# Patient Record
Sex: Female | Born: 1960 | Race: Black or African American | Hispanic: No | Marital: Married | Smoking: Never smoker
Health system: Southern US, Community
[De-identification: ages and names within clinical notes are randomized; demographics above are authoritative.]

## PROBLEM LIST (undated history)

## (undated) DIAGNOSIS — C801 Malignant (primary) neoplasm, unspecified: Secondary | ICD-10-CM

## (undated) DIAGNOSIS — D649 Anemia, unspecified: Secondary | ICD-10-CM

## (undated) DIAGNOSIS — R011 Cardiac murmur, unspecified: Secondary | ICD-10-CM

## (undated) DIAGNOSIS — E785 Hyperlipidemia, unspecified: Secondary | ICD-10-CM

## (undated) DIAGNOSIS — Z01419 Encounter for gynecological examination (general) (routine) without abnormal findings: Secondary | ICD-10-CM

## (undated) DIAGNOSIS — I10 Essential (primary) hypertension: Secondary | ICD-10-CM

## (undated) HISTORY — DX: Essential (primary) hypertension: I10

## (undated) HISTORY — PX: OTHER SURGICAL HISTORY: SHX169

## (undated) HISTORY — PX: HEMORROIDECTOMY: SUR656

## (undated) HISTORY — DX: Hyperlipidemia, unspecified: E78.5

## (undated) HISTORY — DX: Cardiac murmur, unspecified: R01.1

## (undated) HISTORY — DX: Encounter for gynecological examination (general) (routine) without abnormal findings: Z01.419

## (undated) HISTORY — PX: POLYPECTOMY: SHX149

## (undated) HISTORY — DX: Anemia, unspecified: D64.9

---

## 1998-11-15 ENCOUNTER — Ambulatory Visit (HOSPITAL_BASED_OUTPATIENT_CLINIC_OR_DEPARTMENT_OTHER): Admission: RE | Admit: 1998-11-15 | Discharge: 1998-11-15 | Payer: Self-pay | Admitting: Surgery

## 1999-07-25 ENCOUNTER — Other Ambulatory Visit: Admission: RE | Admit: 1999-07-25 | Discharge: 1999-07-25 | Payer: Self-pay | Admitting: Obstetrics & Gynecology

## 2004-09-18 ENCOUNTER — Ambulatory Visit: Payer: Self-pay | Admitting: Family Medicine

## 2005-07-11 ENCOUNTER — Ambulatory Visit: Payer: Self-pay | Admitting: Family Medicine

## 2005-11-20 ENCOUNTER — Ambulatory Visit: Payer: Self-pay | Admitting: Family Medicine

## 2006-02-08 ENCOUNTER — Ambulatory Visit: Payer: Self-pay | Admitting: Internal Medicine

## 2007-01-12 ENCOUNTER — Ambulatory Visit: Payer: Self-pay | Admitting: Family Medicine

## 2007-01-12 DIAGNOSIS — I10 Essential (primary) hypertension: Secondary | ICD-10-CM

## 2007-01-12 DIAGNOSIS — Z8679 Personal history of other diseases of the circulatory system: Secondary | ICD-10-CM | POA: Insufficient documentation

## 2007-01-12 LAB — CONVERTED CEMR LAB
ALT: 22 units/L (ref 0–35)
AST: 19 units/L (ref 0–37)
Albumin: 3.7 g/dL (ref 3.5–5.2)
Alkaline Phosphatase: 73 units/L (ref 39–117)
BUN: 7 mg/dL (ref 6–23)
Basophils Absolute: 0 10*3/uL (ref 0.0–0.1)
Basophils Relative: 0.5 % (ref 0.0–1.0)
Bilirubin, Direct: 0.1 mg/dL (ref 0.0–0.3)
CO2: 27 meq/L (ref 19–32)
Calcium: 9.1 mg/dL (ref 8.4–10.5)
Chloride: 113 meq/L — ABNORMAL HIGH (ref 96–112)
Cholesterol: 204 mg/dL (ref 0–200)
Creatinine, Ser: 0.8 mg/dL (ref 0.4–1.2)
Direct LDL: 163.6 mg/dL
Eosinophils Absolute: 0.1 10*3/uL (ref 0.0–0.6)
Eosinophils Relative: 1.4 % (ref 0.0–5.0)
GFR calc Af Amer: 99 mL/min
GFR calc non Af Amer: 82 mL/min
Glucose, Bld: 100 mg/dL — ABNORMAL HIGH (ref 70–99)
HCT: 36.3 % (ref 36.0–46.0)
HDL: 29.4 mg/dL — ABNORMAL LOW (ref >39.0)
Hemoglobin: 12.2 g/dL (ref 12.0–15.0)
Lymphocytes Relative: 52.1 % — ABNORMAL HIGH (ref 12.0–46.0)
MCHC: 33.6 g/dL (ref 30.0–36.0)
MCV: 83.1 fL (ref 78.0–100.0)
Monocytes Absolute: 0.3 10*3/uL (ref 0.2–0.7)
Monocytes Relative: 6.8 % (ref 3.0–11.0)
Neutro Abs: 1.8 10*3/uL (ref 1.4–7.7)
Neutrophils Relative %: 39.2 % — ABNORMAL LOW (ref 43.0–77.0)
Platelets: 261 10*3/uL (ref 150–400)
Potassium: 3.8 meq/L (ref 3.5–5.1)
RBC: 4.36 M/uL (ref 3.87–5.11)
RDW: 13.7 % (ref 11.5–14.6)
Sodium: 144 meq/L (ref 135–145)
TSH: 0.94 microintl units/mL (ref 0.35–5.50)
Total Bilirubin: 0.6 mg/dL (ref 0.3–1.2)
Total CHOL/HDL Ratio: 6.9
Total Protein: 7.1 g/dL (ref 6.0–8.3)
Triglycerides: 57 mg/dL (ref 0–149)
VLDL: 11 mg/dL (ref 0–40)
WBC: 4.7 10*3/uL (ref 4.5–10.5)

## 2009-03-27 ENCOUNTER — Ambulatory Visit: Payer: Self-pay | Admitting: Family Medicine

## 2009-03-27 DIAGNOSIS — E785 Hyperlipidemia, unspecified: Secondary | ICD-10-CM

## 2009-03-29 ENCOUNTER — Ambulatory Visit: Payer: Self-pay | Admitting: Family Medicine

## 2009-03-31 LAB — CONVERTED CEMR LAB
ALT: 18 units/L (ref 0–35)
AST: 21 units/L (ref 0–37)
BUN: 11 mg/dL (ref 6–23)
Bilirubin Urine: NEGATIVE
Bilirubin, Direct: 0 mg/dL (ref 0.0–0.3)
Cholesterol: 134 mg/dL (ref 0–200)
Creatinine, Ser: 0.8 mg/dL (ref 0.4–1.2)
Eosinophils Relative: 0.9 % (ref 0.0–5.0)
GFR calc non Af Amer: 97.59 mL/min (ref >60)
Ketones, urine, test strip: NEGATIVE
LDL Cholesterol: 95 mg/dL (ref 0–99)
Monocytes Relative: 6.3 % (ref 3.0–12.0)
Neutrophils Relative %: 42.5 % — ABNORMAL LOW (ref 43.0–77.0)
Platelets: 223 10*3/uL (ref 150.0–400.0)
Protein, U semiquant: NEGATIVE
Total Bilirubin: 0.6 mg/dL (ref 0.3–1.2)
Triglycerides: 45 mg/dL (ref 0.0–149.0)
Urobilinogen, UA: 0.2
VLDL: 9 mg/dL (ref 0.0–40.0)
WBC: 4.7 10*3/uL (ref 4.5–10.5)

## 2009-08-23 ENCOUNTER — Telehealth: Payer: Self-pay | Admitting: Family Medicine

## 2010-04-18 ENCOUNTER — Ambulatory Visit: Payer: Self-pay | Admitting: Family Medicine

## 2010-04-23 LAB — CONVERTED CEMR LAB
ALT: 20 units/L (ref 0–35)
AST: 24 units/L (ref 0–37)
Alkaline Phosphatase: 81 units/L (ref 39–117)
BUN: 11 mg/dL (ref 6–23)
Bilirubin, Direct: 0.1 mg/dL (ref 0.0–0.3)
Calcium: 9 mg/dL (ref 8.4–10.5)
Cholesterol: 139 mg/dL (ref 0–200)
Eosinophils Relative: 0.4 % (ref 0.0–5.0)
GFR calc non Af Amer: 117.23 mL/min (ref >60)
HCT: 37.9 % (ref 36.0–46.0)
LDL Cholesterol: 72 mg/dL (ref 0–99)
Lymphocytes Relative: 46.8 % — ABNORMAL HIGH (ref 12.0–46.0)
Lymphs Abs: 2.4 10*3/uL (ref 0.7–4.0)
Monocytes Relative: 5.7 % (ref 3.0–12.0)
Neutrophils Relative %: 46.6 % (ref 43.0–77.0)
Platelets: 242 10*3/uL (ref 150.0–400.0)
Potassium: 3.9 meq/L (ref 3.5–5.1)
Sodium: 140 meq/L (ref 135–145)
Total Bilirubin: 0.8 mg/dL (ref 0.3–1.2)
VLDL: 21 mg/dL (ref 0.0–40.0)
WBC: 5.1 10*3/uL (ref 4.5–10.5)

## 2010-08-14 NOTE — Progress Notes (Signed)
Summary: diltiazem increase  Phone Note Call from Patient Call back at Home Phone [phone]   Caller: vm Summary of Call: BP going up again.  Requesting increase in Diltiazem generic to 180 again to [facility] [place] [phone]. Initial call taken by: Rudy Jew, RN,  August 23, 2009 10:20 AM  Follow-up for Phone Call        okay, Switch to Ditiazem 180 mg once daily for one year Follow-up by: Nelwyn Salisbury MD,  August 23, 2009 1:24 PM  Additional Follow-up for Phone Call Additional follow up Details #1::        Phone Call Completed, Rx Called In Additional Follow-up by: Alfred Levins, CMA,  August 23, 2009 4:19 PM    New/Updated Medications: DILTIAZEM HCL CR 180 MG CP24 (DILTIAZEM HCL) 1 by mouth daily Prescriptions: DILTIAZEM HCL CR 180 MG CP24 (DILTIAZEM HCL) 1 by mouth daily  #30 x 11   Entered by:   Alfred Levins, CMA   Authorized by:   Nelwyn Salisbury MD   Signed by:   Alfred Levins, CMA on 08/23/2009   Method used:   Electronically to        [facility] [phone]* (retail)       [address] [place], [place]  [address]       Ph: [phone] or [phone]       Fax: [phone]   RxID:   [id number]

## 2010-08-14 NOTE — Assessment & Plan Note (Signed)
Summary: MED FOLLOW UP//ALP   Vital Signs:  Patient profile:   50 year old female Weight:      153 pounds O2 Sat:      96 % Temp:     98.4 degrees F Pulse rate:   72 / minute BP sitting:   130 / 84  (left arm) Cuff size:   large  Vitals Entered By: Pura Spice, RN (April 18, 2010 11:15 AM) CC: cough congestions needs refills    History of Present Illness: Here to recheck BP and lipids. She has been doing well, her BP has been stable at home. She has had a cold for several weeks with a dry cough. No ST or fever.   Allergies (verified): No Known Drug Allergies  Past History:  Past Medical History: Hypertension Heart mumur Hyperlipidemia [facility] exams  Past Surgical History: Reviewed history from 01/12/2007 and no changes required. Denies surgical history  Review of Systems  The patient denies anorexia, fever, weight loss, weight gain, vision loss, decreased hearing, hoarseness, chest pain, syncope, dyspnea on exertion, peripheral edema, headaches, hemoptysis, abdominal pain, melena, hematochezia, severe indigestion/heartburn, hematuria, incontinence, genital sores, muscle weakness, suspicious skin lesions, transient blindness, difficulty walking, depression, unusual weight change, abnormal bleeding, enlarged lymph nodes, angioedema, breast masses, and testicular masses.    Physical Exam  General:  Well-developed,well-nourished,in no acute distress; alert,appropriate and cooperative throughout examination Head:  Normocephalic and atraumatic without obvious abnormalities. No apparent alopecia or balding. Eyes:  No corneal or conjunctival inflammation noted. EOMI. Perrla. Funduscopic exam benign, without hemorrhages, exudates or papilledema. Vision grossly normal. Ears:  External ear exam shows no significant lesions or deformities.  Otoscopic examination reveals clear canals, tympanic membranes are intact bilaterally without bulging, retraction,  inflammation or discharge. Hearing is grossly normal bilaterally. Nose:  External nasal examination shows no deformity or inflammation. Nasal mucosa are pink and moist without lesions or exudates. Mouth:  Oral mucosa and oropharynx without lesions or exudates.  Teeth in good repair. Neck:  No deformities, masses, or tenderness noted. Lungs:  Normal respiratory effort, chest expands symmetrically. Lungs are clear to auscultation, no crackles or wheezes. Heart:  Normal rate and regular rhythm. S1 and S2 normal without gallop, murmur, click, rub or other extra sounds.   Impression & Recommendations:  Problem # 1:  VIRAL URI (ICD-465.9)  Her updated medication list for this problem includes:    Hydromet 5-1.5 Mg/37ml Syrp (Hydrocodone-homatropine) .Marland Kitchen... 1 tsp q 4 hours as needed cough  Problem # 2:  HYPERLIPIDEMIA (ICD-272.4)  Her updated medication list for this problem includes:    Simvastatin 40 Mg Tabs (Simvastatin) .Marland Kitchen... Take one tablet at bedtime  Problem # 3:  HYPERTENSION (ICD-401.9)  Her updated medication list for this problem includes:    Hydrochlorothiazide 25 Mg Tabs (Hydrochlorothiazide) .Marland Kitchen... Take 1 tab by mouth every morning    Tenormin 50 Mg Tabs (Atenolol) .Marland Kitchen... 1 by mouth once daily    Diltiazem Hcl Cr 180 Mg Cp24 (Diltiazem hcl) .Marland Kitchen... 1 by mouth daily  Orders: UA Dipstick w/o Micro (automated)  (81003) Venipuncture (13086) TLB-Lipid Panel (80061-LIPID) TLB-BMP (Basic Metabolic Panel-BMET) (80048-METABOL) TLB-CBC Platelet - w/Differential (85025-CBCD) TLB-Hepatic/Liver Function Pnl (80076-HEPATIC) TLB-TSH (Thyroid Stimulating Hormone) (84443-TSH)  Complete Medication List: 1)  Simvastatin 40 Mg Tabs (Simvastatin) .... Take one tablet at bedtime 2)  Hydrochlorothiazide 25 Mg Tabs (Hydrochlorothiazide) .... Take 1 tab by mouth every morning 3)  Tenormin 50 Mg Tabs (Atenolol) .Marland Kitchen.. 1 by mouth  once daily 4)  Diltiazem Hcl Cr 180 Mg Cp24 (Diltiazem hcl) .Marland Kitchen.. 1 by mouth  daily 5)  Hydromet 5-1.5 Mg/76ml Syrp (Hydrocodone-homatropine) .Marland Kitchen.. 1 tsp q 4 hours as needed cough  Patient Instructions: 1)  get labs today Prescriptions: HYDROMET 5-1.5 MG/5ML SYRP (HYDROCODONE-HOMATROPINE) 1 tsp q 4 hours as needed cough  #240 x 0   Entered and Authorized by:   Nelwyn Salisbury MD   Signed by:   Nelwyn Salisbury MD on 04/18/2010   Method used:   Print then Give to Patient   RxID:   [id number] DILTIAZEM HCL CR 180 MG CP24 (DILTIAZEM HCL) 1 by mouth daily  #30 x 11   Entered and Authorized by:   Nelwyn Salisbury MD   Signed by:   Nelwyn Salisbury MD on 04/18/2010   Method used:   Electronically to        [facility] [phone]* (retail)       [address] [place], [place]  [address]       Ph: [phone] or [phone]       Fax: [phone]   RxID:   [id number] TENORMIN 50 MG TABS (ATENOLOL) 1 by mouth once daily  #30 x 11   Entered and Authorized by:   Nelwyn Salisbury MD   Signed by:   Nelwyn Salisbury MD on 04/18/2010   Method used:   Electronically to        [facility] [phone]* (retail)       [address] [place], [place]  [address]       Ph: [phone] or [phone]       Fax: [phone]   RxID:   [id number] HYDROCHLOROTHIAZIDE 25 MG  TABS (HYDROCHLOROTHIAZIDE) Take 1 tab by mouth every morning  #30 x 11   Entered and Authorized by:   Nelwyn Salisbury MD   Signed by:   Nelwyn Salisbury MD on 04/18/2010   Method used:   Electronically to        [facility] [phone]* (retail)       [address] [place], [place]  [address]       Ph: [phone] or [phone]       Fax: [phone]   RxID:   [id number] SIMVASTATIN 40 MG TABS (SIMVASTATIN) Take one tablet at bedtime  #30 x 11   Entered and Authorized by:   Nelwyn Salisbury MD   Signed by:   Nelwyn Salisbury MD on 04/18/2010   Method used:   Electronically to        [facility] [phone]* (retail)       [address] [place], [place]  [address]       Ph: [phone] or [phone]       Fax: [phone]   RxID:   [id number]   Appended Document: Orders Update     Clinical Lists Changes  Orders: Added new Service order of Specimen Handling (35701) - Signed      Appended Document: MED FOLLOW UP//ALP  Laboratory Results   Urine Tests  Routine Urinalysis   Color: yellow Appearance: Clear Glucose: negative   (Normal Range: Negative) Bilirubin: 1+   (Normal Range: Negative) Ketone: negative   (Normal Range: Negative) Spec. Gravity: 1.025   (Normal Range: 1.003-1.035) Blood: negative   (Normal Range: Negative) pH: 6.0   (Normal Range: 5.0-8.0) Protein: 1+   (Normal Range: Negative) Urobilinogen: 0.2   (Normal Range: 0-1) Nitrite: negative   (Normal Range: Negative) Leukocyte Esterace: trace   (Normal Range: Negative)    Comments: Rita Ohara  April 18, 2010 3:17 PM

## 2010-09-06 ENCOUNTER — Other Ambulatory Visit: Payer: Self-pay | Admitting: Family Medicine

## 2011-04-22 ENCOUNTER — Other Ambulatory Visit: Payer: Self-pay | Admitting: Family Medicine

## 2011-04-23 ENCOUNTER — Other Ambulatory Visit: Payer: Self-pay

## 2011-04-23 MED ORDER — ATENOLOL 50 MG PO TABS: 50.0000 mg | ORAL_TABLET | Freq: Every day | ORAL | Status: DC

## 2011-05-15 ENCOUNTER — Encounter: Payer: Self-pay | Admitting: Family Medicine

## 2011-05-15 ENCOUNTER — Ambulatory Visit (INDEPENDENT_AMBULATORY_CARE_PROVIDER_SITE_OTHER): Payer: No Typology Code available for payment source | Admitting: Family Medicine

## 2011-05-15 VITALS — BP 112/70 | HR 86 | Temp 98.4°F | Wt 188.0 lb

## 2011-05-15 DIAGNOSIS — I10 Essential (primary) hypertension: Secondary | ICD-10-CM

## 2011-05-15 DIAGNOSIS — E785 Hyperlipidemia, unspecified: Secondary | ICD-10-CM

## 2011-05-15 DIAGNOSIS — M129 Arthropathy, unspecified: Secondary | ICD-10-CM

## 2011-05-15 DIAGNOSIS — M1990 Unspecified osteoarthritis, unspecified site: Secondary | ICD-10-CM

## 2011-05-15 LAB — LIPID PANEL
Cholesterol: 108 mg/dL (ref 0–200)
HDL: 42.3 mg/dL (ref >39.00)
LDL Cholesterol: 57 mg/dL (ref 0–99)
Total CHOL/HDL Ratio: 3
Triglycerides: 44 mg/dL (ref 0.0–149.0)
VLDL: 8.8 mg/dL (ref 0.0–40.0)

## 2011-05-15 LAB — TSH: TSH: 0.55 u[IU]/mL (ref 0.35–5.50)

## 2011-05-15 LAB — CBC WITH DIFFERENTIAL/PLATELET
Basophils Absolute: 0 10*3/uL (ref 0.0–0.1)
Eosinophils Relative: 0.8 % (ref 0.0–5.0)
HCT: 36.1 % (ref 36.0–46.0)
Hemoglobin: 12.1 g/dL (ref 12.0–15.0)
Lymphocytes Relative: 58.6 % — ABNORMAL HIGH (ref 12.0–46.0)
Lymphs Abs: 2.5 10*3/uL (ref 0.7–4.0)
Monocytes Relative: 5.2 % (ref 3.0–12.0)
Neutro Abs: 1.5 10*3/uL (ref 1.4–7.7)
RDW: 14.8 % — ABNORMAL HIGH (ref 11.5–14.6)
WBC: 4.3 10*3/uL — ABNORMAL LOW (ref 4.5–10.5)

## 2011-05-15 LAB — POCT URINALYSIS DIPSTICK
Bilirubin, UA: NEGATIVE
Glucose, UA: NEGATIVE
Ketones, UA: NEGATIVE
Protein, UA: NEGATIVE
Spec Grav, UA: 1.02

## 2011-05-15 LAB — HEPATIC FUNCTION PANEL
Bilirubin, Direct: 0 mg/dL (ref 0.0–0.3)
Total Bilirubin: 0.2 mg/dL — ABNORMAL LOW (ref 0.3–1.2)

## 2011-05-15 LAB — BASIC METABOLIC PANEL
BUN: 11 mg/dL (ref 6–23)
Calcium: 8.8 mg/dL (ref 8.4–10.5)
Chloride: 106 mEq/L (ref 96–112)
Creatinine, Ser: 0.8 mg/dL (ref 0.4–1.2)
GFR: 104.26 mL/min (ref >60.00)

## 2011-05-15 MED ORDER — ATENOLOL 50 MG PO TABS: 50.0000 mg | ORAL_TABLET | Freq: Every day | ORAL | Status: DC

## 2011-05-15 MED ORDER — DILTIAZEM HCL ER COATED BEADS 120 MG PO CP24: 120.0000 mg | ORAL_CAPSULE | Freq: Every day | ORAL | Status: DC

## 2011-05-15 MED ORDER — SIMVASTATIN 40 MG PO TABS: 40.0000 mg | ORAL_TABLET | Freq: Every day | ORAL | Status: DC

## 2011-05-15 MED ORDER — HYDROCHLOROTHIAZIDE 25 MG PO TABS: 25.0000 mg | ORAL_TABLET | Freq: Every day | ORAL | Status: DC

## 2011-05-15 NOTE — Progress Notes (Signed)
  Subjective:    Patient ID: Rachel Frank, female    DOB: 04/04/1961, 50 y.o.   MRN: [id number]  HPI Here for refills. She feels fine except for some pain in the hips and knees. Tried some Tylenol with little benefit.    Review of Systems  Constitutional: Negative.   Respiratory: Negative.   Cardiovascular: Negative.   Musculoskeletal: Positive for arthralgias.       Objective:   Physical Exam  Constitutional: She appears well-developed and well-nourished.  Neck: No thyromegaly present.  Cardiovascular: Normal rate, regular rhythm, normal heart sounds and intact distal pulses.   Pulmonary/Chest: Effort normal and breath sounds normal.  Lymphadenopathy:    She has no cervical adenopathy.          Assessment & Plan:  Get fasting labs. Refill meds. Try Aleve for the arthritis

## 2011-05-17 NOTE — Progress Notes (Signed)
Quick Note:  Left a message for pt to return call. ______ 

## 2011-05-22 NOTE — Progress Notes (Signed)
Quick Note:  Left voice message with normal results. ______ 

## 2011-06-24 ENCOUNTER — Telehealth: Payer: Self-pay

## 2011-06-24 NOTE — Telephone Encounter (Signed)
Pt would like to know if her blood pressure medication was going to be changed to amlodopine and if she can have a fluid pill as well.  Pls send to [facility] on [address] in [place].

## 2011-06-25 NOTE — Telephone Encounter (Signed)
I don't understand this question. Why would we change her BP med?

## 2011-06-27 NOTE — Telephone Encounter (Signed)
I tried to call home # no answer. I will try again later.

## 2012-06-13 ENCOUNTER — Other Ambulatory Visit: Payer: Self-pay | Admitting: Family Medicine

## 2012-09-08 ENCOUNTER — Other Ambulatory Visit: Payer: Self-pay | Admitting: Family Medicine

## 2012-09-08 NOTE — Telephone Encounter (Signed)
Call in one month of each and she needs an OV soon

## 2012-09-08 NOTE — Telephone Encounter (Signed)
Can we refill these? Pt has not been seen in awhile.

## 2012-09-10 ENCOUNTER — Encounter: Payer: Self-pay | Admitting: Family Medicine

## 2012-09-10 ENCOUNTER — Ambulatory Visit (INDEPENDENT_AMBULATORY_CARE_PROVIDER_SITE_OTHER): Payer: No Typology Code available for payment source | Admitting: Family Medicine

## 2012-09-10 VITALS — BP 128/76 | HR 85 | Temp 98.3°F | Wt 194.0 lb

## 2012-09-10 DIAGNOSIS — I10 Essential (primary) hypertension: Secondary | ICD-10-CM

## 2012-09-10 DIAGNOSIS — E785 Hyperlipidemia, unspecified: Secondary | ICD-10-CM

## 2012-09-10 LAB — CBC WITH DIFFERENTIAL/PLATELET
Basophils Absolute: 0 10*3/uL (ref 0.0–0.1)
HCT: 38.5 % (ref 36.0–46.0)
Hemoglobin: 12.7 g/dL (ref 12.0–15.0)
Lymphs Abs: 2.6 10*3/uL (ref 0.7–4.0)
MCHC: 33.1 g/dL (ref 30.0–36.0)
Monocytes Relative: 6.9 % (ref 3.0–12.0)
Neutro Abs: 2 10*3/uL (ref 1.4–7.7)
RDW: 14.5 % (ref 11.5–14.6)

## 2012-09-10 LAB — HEPATIC FUNCTION PANEL
ALT: 25 U/L (ref 0–35)
AST: 24 U/L (ref 0–37)
Alkaline Phosphatase: 93 U/L (ref 39–117)
Bilirubin, Direct: 0.1 mg/dL (ref 0.0–0.3)
Total Bilirubin: 0.7 mg/dL (ref 0.3–1.2)

## 2012-09-10 LAB — POCT URINALYSIS DIPSTICK
Ketones, UA: NEGATIVE
Spec Grav, UA: 1.02
pH, UA: 6.5

## 2012-09-10 LAB — TSH: TSH: 1.1 u[IU]/mL (ref 0.35–5.50)

## 2012-09-10 LAB — BASIC METABOLIC PANEL
Chloride: 102 mEq/L (ref 96–112)
Potassium: 3.9 mEq/L (ref 3.5–5.1)

## 2012-09-10 LAB — LIPID PANEL
LDL Cholesterol: 75 mg/dL (ref 0–99)
Total CHOL/HDL Ratio: 4

## 2012-09-10 MED ORDER — ATENOLOL 50 MG PO TABS: 50.0000 mg | ORAL_TABLET | Freq: Every day | ORAL | Status: DC

## 2012-09-10 MED ORDER — DILTIAZEM HCL ER COATED BEADS 120 MG PO CP24: 120.0000 mg | ORAL_CAPSULE | Freq: Every day | ORAL | Status: DC

## 2012-09-10 MED ORDER — SIMVASTATIN 40 MG PO TABS: 40.0000 mg | ORAL_TABLET | Freq: Every day | ORAL | Status: DC

## 2012-09-10 MED ORDER — HYDROCHLOROTHIAZIDE 25 MG PO TABS: 25.0000 mg | ORAL_TABLET | Freq: Every day | ORAL | Status: DC

## 2012-09-10 NOTE — Progress Notes (Signed)
  Subjective:    Patient ID: Rachel Frank, female    DOB: 1961/04/08, 52 y.o.   MRN: [id number]  HPI Here for refills. She feels good and has no concerns. She had been exercising but stopped about 6 months ago. Her BP is stable at home.    Review of Systems  Constitutional: Negative.   Respiratory: Negative.   Cardiovascular: Negative.        Objective:   Physical Exam  Constitutional: She appears well-developed and well-nourished.  Neck: No thyromegaly present.  Cardiovascular: Normal rate, regular rhythm, normal heart sounds and intact distal pulses.   Pulmonary/Chest: Effort normal and breath sounds normal.  Lymphadenopathy:    She has no cervical adenopathy.          Assessment & Plan:  Get fasting labs. I encouraged to get back in the gym.

## 2012-09-11 NOTE — Progress Notes (Signed)
Quick Note:  I left voice message with results. ______ 

## 2012-09-25 ENCOUNTER — Telehealth: Payer: Self-pay | Admitting: Family Medicine

## 2012-09-25 MED ORDER — AMLODIPINE BESYLATE 5 MG PO TABS: 5.0000 mg | ORAL_TABLET | Freq: Every day | ORAL | Status: DC

## 2012-09-25 NOTE — Telephone Encounter (Signed)
Stop Diltiazem and call in Amlodipine 5 mg a day for one year

## 2012-09-25 NOTE — Telephone Encounter (Signed)
I sent script e-scribe and left a voice message with the below information. 

## 2012-09-25 NOTE — Telephone Encounter (Signed)
Request to change from Diltiazem to Amlodipine due to cost?

## 2013-07-02 ENCOUNTER — Ambulatory Visit (INDEPENDENT_AMBULATORY_CARE_PROVIDER_SITE_OTHER): Payer: BC Managed Care – PPO | Admitting: Family

## 2013-07-02 ENCOUNTER — Encounter: Payer: Self-pay | Admitting: Family

## 2013-07-02 VITALS — BP 142/80 | HR 83 | Wt 203.0 lb

## 2013-07-02 DIAGNOSIS — R109 Unspecified abdominal pain: Secondary | ICD-10-CM

## 2013-07-02 DIAGNOSIS — M549 Dorsalgia, unspecified: Secondary | ICD-10-CM

## 2013-07-02 DIAGNOSIS — T148XXA Other injury of unspecified body region, initial encounter: Secondary | ICD-10-CM

## 2013-07-02 LAB — POCT URINALYSIS DIPSTICK
Bilirubin, UA: NEGATIVE
Blood, UA: NEGATIVE
Nitrite, UA: NEGATIVE
Spec Grav, UA: 1.015
pH, UA: 8.5

## 2013-07-02 MED ORDER — MELOXICAM 15 MG PO TABS: 15.0000 mg | ORAL_TABLET | Freq: Every day | ORAL | Status: DC

## 2013-07-02 MED ORDER — HYDROCODONE-HOMATROPINE 5-1.5 MG/5ML PO SYRP: 5.0000 mL | ORAL_SOLUTION | Freq: Three times a day (TID) | ORAL | Status: DC | PRN

## 2013-07-02 MED ORDER — CYCLOBENZAPRINE HCL 10 MG PO TABS: 10.0000 mg | ORAL_TABLET | Freq: Three times a day (TID) | ORAL | Status: DC | PRN

## 2013-07-02 NOTE — Progress Notes (Signed)
Pre visit review using our clinic review tool, if applicable. No additional management support is needed unless otherwise documented below in the visit note. 

## 2013-07-02 NOTE — Progress Notes (Signed)
Subjective:    Patient ID: Rachel Frank, female    DOB: Apr 20, 1961, 52 y.o.   MRN: [id number]  HPI  52 year old AA, nonsmoker presenting with right flank that radiates under breast bone to upper right buttock.  She complains that pain is constant.  Pain at a 7, on a scale of 1-10.  Has taken Tylenol and Advil. Pain is worse with movement.   No rednes, no rash, or swelling noted.  No pain noted on palpation.  Patient states she noticed her urine was dark and has increased her water intake. Complains of hacking cough that has been present x two month and requesting cough medication for relief.     Review of Systems  Constitutional: Negative.   HENT: Negative.   Eyes: Negative.   Respiratory: Positive for cough.        Complains of a hacking cough that has present for two months  Cardiovascular: Positive for palpitations.  Gastrointestinal: Negative.   Endocrine: Negative.   Genitourinary:       Complains of dark urine  Musculoskeletal: Positive for back pain.       Complains of pain under breast bone to top of right buttock.  Skin: Negative.   Allergic/Immunologic: Negative.   Neurological: Negative.   Hematological: Negative.   Psychiatric/Behavioral: Negative.     Past Medical History  Diagnosis Date  . Hypertension   . Heart murmur   . Hyperlipidemia   . Gynecological examination     [facility]    History   Social History  . Marital Status: Married    Spouse Name: N/A    Number of Children: N/A  . Years of Education: N/A   Occupational History  . Not on file.   Social History Main Topics  . Smoking status: Never Smoker   . Smokeless tobacco: Never Used  . Alcohol Use: No  . Drug Use: No  . Sexual Activity: Not on file   Other Topics Concern  . Not on file   Social History Narrative  . No narrative on file    History reviewed. No pertinent past surgical history.  Family History  Problem Relation Age of Onset  .  Hypertension Other   . Cancer Other     lung, prostate    No Known Allergies  Current Outpatient Prescriptions on File Prior to Visit  Medication Sig Dispense Refill  . amLODipine (NORVASC) 5 MG tablet Take 1 tablet (5 mg total) by mouth daily.  30 tablet  11  . atenolol (TENORMIN) 50 MG tablet Take 1 tablet (50 mg total) by mouth daily.  30 tablet  11  . hydrochlorothiazide (HYDRODIURIL) 25 MG tablet Take 1 tablet (25 mg total) by mouth daily.  30 tablet  11  . Multiple Vitamin (MULTIVITAMIN) tablet Take 1 tablet by mouth daily.        . simvastatin (ZOCOR) 40 MG tablet Take 1 tablet (40 mg total) by mouth at bedtime.  30 tablet  11   No current facility-administered medications on file prior to visit.    BP 142/80  Pulse 83  Wt 203 lb (92.08 kg)chart    Objective:   Physical Exam  Constitutional: She is oriented to person, place, and time. She appears well-developed and well-nourished.  Neck: Normal range of motion. Neck supple.  Cardiovascular: Normal rate, regular rhythm and normal heart sounds.   Pulmonary/Chest: Effort normal and breath sounds normal.  Abdominal: Soft. Bowel sounds  are normal.  Musculoskeletal: She exhibits tenderness.  No redness or swelling noted.  Back non-tender on palpation. Worse with lateral movement.   Neurological: She is alert and oriented to person, place, and time.  Skin: Skin is warm and dry.  Psychiatric: She has a normal mood and affect.  Obtain urinalysis and sent for culture.        Assessment & Plan:  Assessment 1. Muscle strain  2.Cough 3. Concentrated urine  Plan Continue with current medications.  Order for Flexeril and Meloxicam for muscle strain.  Hycodan ordered for cough.  Increase fluids.  Call the office if problem persists or worsens.

## 2013-07-02 NOTE — Patient Instructions (Signed)
Muscle Strain  Muscle strain occurs when a muscle is stretched beyond its normal length. A small number of muscle fibers generally are torn. This is especially common in athletes. This happens when a sudden, violent force placed on a muscle stretches it too far. Usually, recovery from muscle strain takes 1 to 2 weeks. Complete healing will take 5 to 6 weeks.   HOME CARE INSTRUCTIONS    While awake, apply ice to the sore muscle for the first 2 days after the injury.   Put ice in a plastic bag.   Place a towel between your skin and the bag.   Leave the ice on for 15-20 minutes each hour.   Do not use the strained muscle for several days, until you no longer have pain.   You may wrap the injured area with an elastic bandage for comfort. Be careful not to wrap it too tightly. This may interfere with blood circulation or increase swelling.   Only take over-the-counter or prescription medicines for pain, discomfort, or fever as directed by your caregiver.  SEEK MEDICAL CARE IF:   You have increasing pain or swelling in the injured area.  MAKE SURE YOU:    Understand these instructions.   Will watch your condition.   Will get help right away if you are not doing well or get worse.  Document Released: 07/01/2005 Document Revised: 09/23/2011 Document Reviewed: 07/13/2011  Exit[employer] Patient Information 2014 [employer].

## 2013-09-19 ENCOUNTER — Other Ambulatory Visit: Payer: Self-pay | Admitting: Family Medicine

## 2013-09-27 ENCOUNTER — Other Ambulatory Visit: Payer: Self-pay | Admitting: Family

## 2013-09-27 MED ORDER — AMLODIPINE BESYLATE 5 MG PO TABS: 5.0000 mg | ORAL_TABLET | Freq: Every day | ORAL | Status: DC

## 2013-09-27 MED ORDER — MELOXICAM 15 MG PO TABS: 15.0000 mg | ORAL_TABLET | Freq: Every day | ORAL | Status: DC

## 2013-09-27 MED ORDER — ATENOLOL 50 MG PO TABS: ORAL_TABLET | ORAL | Status: DC

## 2013-09-27 MED ORDER — SIMVASTATIN 40 MG PO TABS: ORAL_TABLET | ORAL | Status: DC

## 2013-09-27 MED ORDER — HYDROCHLOROTHIAZIDE 25 MG PO TABS: 25.0000 mg | ORAL_TABLET | Freq: Every day | ORAL | Status: DC

## 2014-04-26 ENCOUNTER — Telehealth: Payer: Self-pay | Admitting: Family Medicine

## 2014-04-26 NOTE — Telephone Encounter (Signed)
Pt would like to know if she can have her CPE sooner than Friday 11/27. I told her i would check with you and call her back.

## 2014-04-26 NOTE — Telephone Encounter (Signed)
Okay to schedule

## 2014-04-27 NOTE — Telephone Encounter (Signed)
Both #'s listed for patient are d/c'd. LM for husband to have her call me.

## 2014-06-10 ENCOUNTER — Encounter: Payer: BC Managed Care – PPO | Admitting: Family Medicine

## 2014-06-13 ENCOUNTER — Encounter: Payer: Self-pay | Admitting: Family Medicine

## 2014-06-13 ENCOUNTER — Ambulatory Visit (INDEPENDENT_AMBULATORY_CARE_PROVIDER_SITE_OTHER): Payer: 59 | Admitting: Family Medicine

## 2014-06-13 VITALS — BP 134/92 | HR 78 | Temp 98.2°F | Ht 62.0 in | Wt 206.0 lb

## 2014-06-13 DIAGNOSIS — Z0000 Encounter for general adult medical examination without abnormal findings: Secondary | ICD-10-CM

## 2014-06-13 LAB — HEPATIC FUNCTION PANEL
ALK PHOS: 94 U/L (ref 39–117)
ALT: 22 U/L (ref 0–35)
AST: 21 U/L (ref 0–37)
Albumin: 4.2 g/dL (ref 3.5–5.2)
Bilirubin, Direct: 0.1 mg/dL (ref 0.0–0.3)
TOTAL PROTEIN: 7.3 g/dL (ref 6.0–8.3)
Total Bilirubin: 0.5 mg/dL (ref 0.2–1.2)

## 2014-06-13 LAB — CBC WITH DIFFERENTIAL/PLATELET
BASOS ABS: 0 10*3/uL (ref 0.0–0.1)
Basophils Relative: 0.6 % (ref 0.0–3.0)
EOS ABS: 0 10*3/uL (ref 0.0–0.7)
Eosinophils Relative: 0.6 % (ref 0.0–5.0)
HEMATOCRIT: 35.3 % — AB (ref 36.0–46.0)
Hemoglobin: 11.4 g/dL — ABNORMAL LOW (ref 12.0–15.0)
LYMPHS ABS: 2.4 10*3/uL (ref 0.7–4.0)
Lymphocytes Relative: 48 % — ABNORMAL HIGH (ref 12.0–46.0)
MCHC: 32.2 g/dL (ref 30.0–36.0)
MCV: 82.5 fl (ref 78.0–100.0)
MONO ABS: 0.3 10*3/uL (ref 0.1–1.0)
Monocytes Relative: 6 % (ref 3.0–12.0)
Neutro Abs: 2.2 10*3/uL (ref 1.4–7.7)
Neutrophils Relative %: 44.8 % (ref 43.0–77.0)
Platelets: 279 10*3/uL (ref 150.0–400.0)
RBC: 4.28 Mil/uL (ref 3.87–5.11)
RDW: 15.2 % (ref 11.5–15.5)
WBC: 5 10*3/uL (ref 4.0–10.5)

## 2014-06-13 LAB — LIPID PANEL
CHOL/HDL RATIO: 4
Cholesterol: 133 mg/dL (ref 0–200)
HDL: 34.5 mg/dL — AB (ref >39.00)
LDL CALC: 83 mg/dL (ref 0–99)
NONHDL: 98.5
TRIGLYCERIDES: 79 mg/dL (ref 0.0–149.0)
VLDL: 15.8 mg/dL (ref 0.0–40.0)

## 2014-06-13 LAB — BASIC METABOLIC PANEL
BUN: 11 mg/dL (ref 6–23)
CALCIUM: 9 mg/dL (ref 8.4–10.5)
CO2: 27 mEq/L (ref 19–32)
CREATININE: 0.7 mg/dL (ref 0.4–1.2)
Chloride: 102 mEq/L (ref 96–112)
GFR: 106.32 mL/min (ref >60.00)
Glucose, Bld: 92 mg/dL (ref 70–99)
Potassium: 3.3 mEq/L — ABNORMAL LOW (ref 3.5–5.1)
Sodium: 139 mEq/L (ref 135–145)

## 2014-06-13 LAB — POCT URINALYSIS DIPSTICK
Bilirubin, UA: NEGATIVE
Glucose, UA: NEGATIVE
Ketones, UA: NEGATIVE
Nitrite, UA: NEGATIVE
PROTEIN UA: NEGATIVE
RBC UA: NEGATIVE
SPEC GRAV UA: 1.015
UROBILINOGEN UA: 0.2
pH, UA: 7.5

## 2014-06-13 LAB — TSH: TSH: 1.58 u[IU]/mL (ref 0.35–4.50)

## 2014-06-13 MED ORDER — AMLODIPINE BESYLATE 5 MG PO TABS: 5.0000 mg | ORAL_TABLET | Freq: Every day | ORAL | Status: DC

## 2014-06-13 MED ORDER — HYDROCHLOROTHIAZIDE 25 MG PO TABS: 25.0000 mg | ORAL_TABLET | Freq: Every day | ORAL | Status: DC

## 2014-06-13 MED ORDER — ATENOLOL 50 MG PO TABS: ORAL_TABLET | ORAL | Status: DC

## 2014-06-13 MED ORDER — MELOXICAM 15 MG PO TABS: 15.0000 mg | ORAL_TABLET | Freq: Every day | ORAL | Status: DC

## 2014-06-13 MED ORDER — SIMVASTATIN 40 MG PO TABS: ORAL_TABLET | ORAL | Status: DC

## 2014-06-13 NOTE — Progress Notes (Signed)
Pre visit review using our clinic review tool, if applicable. No additional management support is needed unless otherwise documented below in the visit note. 

## 2014-06-13 NOTE — Progress Notes (Signed)
   Subjective:    Patient ID: Rachel Frank, female    DOB: July 12, 1961, 53 y.o.   MRN: [id number]  HPI 53 yr old female for a cpx. She feels well except for some intermittent left shoulder pain. She used Meloxicam in the past with good results, but she ran out.    Review of Systems  Constitutional: Negative.   HENT: Negative.   Eyes: Negative.   Respiratory: Negative.   Cardiovascular: Negative.   Gastrointestinal: Negative.   Genitourinary: Negative for dysuria, urgency, frequency, hematuria, flank pain, decreased urine volume, enuresis, difficulty urinating, pelvic pain and dyspareunia.  Musculoskeletal: Negative.   Skin: Negative.   Neurological: Negative.   Psychiatric/Behavioral: Negative.        Objective:   Physical Exam  Constitutional: She is oriented to person, place, and time. She appears well-developed and well-nourished. No distress.  HENT:  Head: Normocephalic and atraumatic.  Right Ear: External ear normal.  Left Ear: External ear normal.  Nose: Nose normal.  Mouth/Throat: Oropharynx is clear and moist. No oropharyngeal exudate.  Eyes: Conjunctivae and EOM are normal. Pupils are equal, round, and reactive to light. No scleral icterus.  Neck: Normal range of motion. Neck supple. No JVD present. No thyromegaly present.  Cardiovascular: Normal rate, regular rhythm, normal heart sounds and intact distal pulses.  Exam reveals no gallop and no friction rub.   No murmur heard. EKG normal except incomplete LBBB  Pulmonary/Chest: Effort normal and breath sounds normal. No respiratory distress. She has no wheezes. She has no rales. She exhibits no tenderness.  Abdominal: Soft. Bowel sounds are normal. She exhibits no distension and no mass. There is no tenderness. There is no rebound and no guarding.  Musculoskeletal: Normal range of motion. She exhibits no edema or tenderness.  Lymphadenopathy:    She has no cervical adenopathy.  Neurological: She is alert and oriented  to person, place, and time. She has normal reflexes. No cranial nerve deficit. She exhibits normal muscle tone. Coordination normal.  Skin: Skin is warm and dry. No rash noted. No erythema.  Psychiatric: She has a normal mood and affect. Her behavior is normal. Judgment and thought content normal.          Assessment & Plan:  Well exam. Get fasting labs

## 2014-06-14 ENCOUNTER — Other Ambulatory Visit: Payer: Self-pay

## 2014-06-15 MED ORDER — POTASSIUM CHLORIDE ER 10 MEQ PO TBCR: 10.0000 meq | EXTENDED_RELEASE_TABLET | Freq: Every day | ORAL | Status: DC

## 2014-06-15 NOTE — Addendum Note (Signed)
Addended by: Aggie Hacker A on: 06/15/2014 10:38 AM   Modules accepted: Orders

## 2014-06-20 ENCOUNTER — Encounter: Payer: Self-pay | Admitting: Gastroenterology

## 2014-09-02 ENCOUNTER — Ambulatory Visit (AMBULATORY_SURGERY_CENTER): Payer: Self-pay | Admitting: *Deleted

## 2014-09-02 VITALS — Ht 62.0 in | Wt 212.6 lb

## 2014-09-02 DIAGNOSIS — Z1211 Encounter for screening for malignant neoplasm of colon: Secondary | ICD-10-CM

## 2014-09-02 MED ORDER — MOVIPREP 100 G PO SOLR: 1.0000 | Freq: Once | ORAL | Status: DC

## 2014-09-02 NOTE — Progress Notes (Signed)
No egg or soy allergy No home 02 use No diet pills that contain phentermine No  issues with past sedation Pt declined emmi video

## 2014-09-16 ENCOUNTER — Encounter: Payer: Self-pay | Admitting: Gastroenterology

## 2014-09-16 ENCOUNTER — Ambulatory Visit (AMBULATORY_SURGERY_CENTER): Payer: 59 | Admitting: Gastroenterology

## 2014-09-16 ENCOUNTER — Encounter: Payer: 59 | Admitting: Gastroenterology

## 2014-09-16 VITALS — BP 109/74 | HR 75 | Temp 97.7°F | Resp 17 | Ht 62.0 in | Wt 212.0 lb

## 2014-09-16 DIAGNOSIS — Z1211 Encounter for screening for malignant neoplasm of colon: Secondary | ICD-10-CM

## 2014-09-16 DIAGNOSIS — D120 Benign neoplasm of cecum: Secondary | ICD-10-CM

## 2014-09-16 HISTORY — PX: COLONOSCOPY: SHX174

## 2014-09-16 MED ORDER — SODIUM CHLORIDE 0.9 % IV SOLN: 500.0000 mL | INTRAVENOUS | Status: DC

## 2014-09-16 NOTE — Op Note (Signed)
[facility]  [address] [place], [id number]   COLONOSCOPY PROCEDURE REPORT  PATIENT: Rachel, Frank  MR#: [id number] BIRTHDATE: 12/20/1960 , 60  yrs. old GENDER: female ENDOSCOPIST: Milus Banister, MD REFERRED OI:[id number] Raymon Mutton, M.D. PROCEDURE DATE:  09/16/2014 PROCEDURE:   Colonoscopy with snare polypectomy First Screening Colonoscopy - Avg.  risk and is 50 yrs.  old or older Yes.  Prior Negative Screening - Now for repeat screening. N/A  History of Adenoma - Now for follow-up colonoscopy & has been > or = to 3 yrs.  N/A  Polyps Removed Today? Yes. ASA CLASS:   Class II INDICATIONS:average risk patient for colon cancer. MEDICATIONS: Monitored anesthesia care, Propofol 400 mg IV, and lidocaine 40mg  IV  DESCRIPTION OF PROCEDURE:   After the risks benefits and alternatives of the procedure were thoroughly explained, informed consent was obtained.  The digital rectal exam revealed no abnormalities of the rectum.   The LB CF-H180AL Loaner [id number] endoscope was introduced through the anus and advanced to the cecum, which was identified by both the appendix and ileocecal valve. No adverse events experienced.   The quality of the prep was excellent.  The instrument was then slowly withdrawn as the colon was fully examined.   COLON FINDINGS: There was a 2.6cm soft, villous appearing polyp located in the cecum.  This was sessile, removed with cold snare in a piecemeal fashion.  The examination was otherwise normal. Retroflexed views revealed no abnormalities. The time to cecum=2 minutes 30 seconds.  Withdrawal time=18 minutes 50 seconds.  The scope was withdrawn and the procedure completed. COMPLICATIONS: There were no immediate complications.  ENDOSCOPIC IMPRESSION: There was a 2.6cm soft, villous polyp located in the cecum.  This was sessile, removed with cold snare in a piecemeal fashion.  The examination was otherwise normal  RECOMMENDATIONS: If the polyp(s)  removed today are proven to be adenomatous (pre-cancerous) polyps, you will need a colonoscopy in 3-6 months Otherwise you should continue to follow colorectal cancer screening guidelines for "routine risk" patients with a colonoscopy in 10 years.  You will receive a letter within 1-2 weeks with the results of your biopsy as well as final recommendations.  Please call my office if you have not received a letter after 3 weeks.  eSigned:  Milus Banister, MD 09/16/2014 10:40 AM

## 2014-09-16 NOTE — Progress Notes (Signed)
Stable to RR 

## 2014-09-16 NOTE — Patient Instructions (Addendum)
YOU HAD AN ENDOSCOPIC PROCEDURE TODAY AT [facility]:   Refer to the procedure report that was given to you for any specific questions about what was found during the examination.  If the procedure report does not answer your questions, please call your gastroenterologist to clarify.  If you requested that your care partner not be given the details of your procedure findings, then the procedure report has been included in a sealed envelope for you to review at your convenience later.  YOU SHOULD EXPECT: Some feelings of bloating in the abdomen. Passage of more gas than usual.  Walking can help get rid of the air that was put into your GI tract during the procedure and reduce the bloating. If you had a lower endoscopy (such as a colonoscopy or flexible sigmoidoscopy) you may notice spotting of blood in your stool or on the toilet paper. If you underwent a bowel prep for your procedure, you may not have a normal bowel movement for a few days.  Please Note:  You might notice some irritation and congestion in your nose or some drainage.  This is from the oxygen used during your procedure.  There is no need for concern and it should clear up in a day or so.  SYMPTOMS TO REPORT IMMEDIATELY:   Following lower endoscopy (colonoscopy or flexible sigmoidoscopy):  Excessive amounts of blood in the stool  Significant tenderness or worsening of abdominal pains  Swelling of the abdomen that is new, acute  Fever of 100F or higher    For urgent or emergent issues, a gastroenterologist can be reached at any hour by calling [phone].   DIET: Your first meal following the procedure should be a small meal and then it is ok to progress to your normal diet. Heavy or fried foods are harder to digest and may make you feel nauseous or bloated.  Likewise, meals heavy in dairy and vegetables can increase bloating.  Drink plenty of fluids but you should avoid alcoholic beverages for 24  hours.  ACTIVITY:  You should plan to take it easy for the rest of today and you should NOT DRIVE or use heavy machinery until tomorrow (because of the sedation medicines used during the test).    FOLLOW UP: Our staff will call the number listed on your records the next business day following your procedure to check on you and address any questions or concerns that you may have regarding the information given to you following your procedure. If we do not reach you, we will leave a message.  However, if you are feeling well and you are not experiencing any problems, there is no need to return our call.  We will assume that you have returned to your regular daily activities without incident.  If any biopsies were taken you will be contacted by phone or by letter within the next 1-3 weeks.  Please call us at [phone] if you have not heard about the biopsies in 3 weeks.    SIGNATURES/CONFIDENTIALITY: You and/or your care partner have signed paperwork which will be entered into your electronic medical record.  These signatures attest to the fact that that the information above on your After Visit Summary has been reviewed and is understood.  Full responsibility of the confidentiality of this discharge information lies with you and/or your care-partner.   Information on polyps given to you today  NO ASPIRIN OR ANTI- INFLAMMATORY PRODUCTS FOR 2 WEEKS

## 2014-09-16 NOTE — Progress Notes (Signed)
Called to room to assist during endoscopic procedure.  Patient ID and intended procedure confirmed with present staff. Received instructions for my participation in the procedure from the performing physician.  

## 2014-09-19 ENCOUNTER — Telehealth: Payer: Self-pay | Admitting: *Deleted

## 2014-09-19 NOTE — Telephone Encounter (Signed)
  Follow up Call-  Call back number 09/16/2014  Post procedure Call Back phone  # [phone]  Permission to leave phone message Yes     Patient questions:  Do you have a fever, pain , or abdominal swelling? No. Pain Score  0 *  Have you tolerated food without any problems? Yes.    Have you been able to return to your normal activities? Yes.    Do you have any questions about your discharge instructions: Diet   No. Medications  No. Follow up visit  No.  Do you have questions or concerns about your Care? No.  Actions: * If pain score is 4 or above:0 No action needed, pain <4.

## 2014-09-27 ENCOUNTER — Encounter: Payer: Self-pay | Admitting: Gastroenterology

## 2014-10-20 ENCOUNTER — Other Ambulatory Visit: Payer: Self-pay | Admitting: *Deleted

## 2014-10-20 MED ORDER — HYDROCHLOROTHIAZIDE 25 MG PO TABS: 25.0000 mg | ORAL_TABLET | Freq: Every day | ORAL | Status: DC

## 2014-10-20 MED ORDER — SIMVASTATIN 40 MG PO TABS: ORAL_TABLET | ORAL | Status: DC

## 2014-10-20 MED ORDER — POTASSIUM CHLORIDE ER 10 MEQ PO TBCR: 10.0000 meq | EXTENDED_RELEASE_TABLET | Freq: Every day | ORAL | Status: DC

## 2014-10-20 MED ORDER — MELOXICAM 15 MG PO TABS: 15.0000 mg | ORAL_TABLET | Freq: Every day | ORAL | Status: DC

## 2014-10-20 MED ORDER — AMLODIPINE BESYLATE 5 MG PO TABS: 5.0000 mg | ORAL_TABLET | Freq: Every day | ORAL | Status: DC

## 2014-10-20 MED ORDER — ATENOLOL 50 MG PO TABS: ORAL_TABLET | ORAL | Status: DC

## 2014-12-02 ENCOUNTER — Encounter: Payer: Self-pay | Admitting: Gastroenterology

## 2014-12-06 ENCOUNTER — Telehealth: Payer: Self-pay

## 2014-12-06 NOTE — Telephone Encounter (Signed)
Left message for pt to call back concerning need for mammogram 

## 2015-02-21 ENCOUNTER — Emergency Department (HOSPITAL_COMMUNITY)
Admission: EM | Admit: 2015-02-21 | Discharge: 2015-02-21 | Disposition: A | Discharge status: Home / Self Care | Payer: Commercial Managed Care - HMO | Attending: Emergency Medicine | Admitting: Emergency Medicine

## 2015-02-21 ENCOUNTER — Encounter (HOSPITAL_COMMUNITY): Payer: Self-pay | Admitting: Emergency Medicine

## 2015-02-21 ENCOUNTER — Emergency Department (HOSPITAL_COMMUNITY): Payer: Commercial Managed Care - HMO

## 2015-02-21 DIAGNOSIS — I10 Essential (primary) hypertension: Secondary | ICD-10-CM | POA: Insufficient documentation

## 2015-02-21 DIAGNOSIS — S29011A Strain of muscle and tendon of front wall of thorax, initial encounter: Secondary | ICD-10-CM | POA: Insufficient documentation

## 2015-02-21 DIAGNOSIS — Y92410 Unspecified street and highway as the place of occurrence of the external cause: Secondary | ICD-10-CM | POA: Insufficient documentation

## 2015-02-21 DIAGNOSIS — Y998 Other external cause status: Secondary | ICD-10-CM | POA: Diagnosis not present

## 2015-02-21 DIAGNOSIS — Z791 Long term (current) use of non-steroidal anti-inflammatories (NSAID): Secondary | ICD-10-CM | POA: Insufficient documentation

## 2015-02-21 DIAGNOSIS — S199XXA Unspecified injury of neck, initial encounter: Secondary | ICD-10-CM | POA: Insufficient documentation

## 2015-02-21 DIAGNOSIS — Y9389 Activity, other specified: Secondary | ICD-10-CM | POA: Diagnosis not present

## 2015-02-21 DIAGNOSIS — R011 Cardiac murmur, unspecified: Secondary | ICD-10-CM | POA: Diagnosis not present

## 2015-02-21 DIAGNOSIS — Z79899 Other long term (current) drug therapy: Secondary | ICD-10-CM | POA: Insufficient documentation

## 2015-02-21 DIAGNOSIS — E785 Hyperlipidemia, unspecified: Secondary | ICD-10-CM | POA: Insufficient documentation

## 2015-02-21 DIAGNOSIS — Z862 Personal history of diseases of the blood and blood-forming organs and certain disorders involving the immune mechanism: Secondary | ICD-10-CM | POA: Diagnosis not present

## 2015-02-21 DIAGNOSIS — S299XXA Unspecified injury of thorax, initial encounter: Secondary | ICD-10-CM | POA: Diagnosis present

## 2015-02-21 MED ORDER — HYDROCODONE-ACETAMINOPHEN 5-325 MG PO TABS: 1.0000 | ORAL_TABLET | Freq: Four times a day (QID) | ORAL | Status: DC | PRN

## 2015-02-21 NOTE — Discharge Instructions (Signed)
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
## 2015-04-08 ENCOUNTER — Other Ambulatory Visit: Payer: Self-pay | Admitting: Family Medicine

## 2015-04-17 ENCOUNTER — Other Ambulatory Visit: Payer: Self-pay | Admitting: Family Medicine

## 2015-04-20 ENCOUNTER — Other Ambulatory Visit: Payer: Self-pay

## 2015-04-20 MED ORDER — MELOXICAM 15 MG PO TABS: ORAL_TABLET | ORAL | Status: DC

## 2015-04-20 MED ORDER — SIMVASTATIN 40 MG PO TABS: ORAL_TABLET | ORAL | Status: DC

## 2015-06-12 ENCOUNTER — Other Ambulatory Visit: Payer: Self-pay

## 2015-06-12 DIAGNOSIS — Z1231 Encounter for screening mammogram for malignant neoplasm of breast: Secondary | ICD-10-CM

## 2015-06-19 ENCOUNTER — Encounter: Payer: Self-pay | Admitting: Family Medicine

## 2015-06-19 ENCOUNTER — Ambulatory Visit (INDEPENDENT_AMBULATORY_CARE_PROVIDER_SITE_OTHER): Payer: Commercial Managed Care - HMO | Admitting: Family Medicine

## 2015-06-19 VITALS — BP 147/89 | HR 79 | Temp 98.1°F | Ht 62.0 in | Wt 206.0 lb

## 2015-06-19 DIAGNOSIS — Z23 Encounter for immunization: Secondary | ICD-10-CM | POA: Diagnosis not present

## 2015-06-19 DIAGNOSIS — E785 Hyperlipidemia, unspecified: Secondary | ICD-10-CM | POA: Diagnosis not present

## 2015-06-19 DIAGNOSIS — I10 Essential (primary) hypertension: Secondary | ICD-10-CM

## 2015-06-19 LAB — LIPID PANEL
Cholesterol: 118 mg/dL (ref 0–200)
HDL: 33.8 mg/dL — ABNORMAL LOW
LDL Cholesterol: 68 mg/dL (ref 0–99)
NonHDL: 84.19
Total CHOL/HDL Ratio: 3
Triglycerides: 82 mg/dL (ref 0.0–149.0)
VLDL: 16.4 mg/dL (ref 0.0–40.0)

## 2015-06-19 LAB — BASIC METABOLIC PANEL
BUN: 11 mg/dL (ref 6–23)
CHLORIDE: 104 meq/L (ref 96–112)
CO2: 29 mEq/L (ref 19–32)
Calcium: 9.3 mg/dL (ref 8.4–10.5)
Creatinine, Ser: 0.67 mg/dL (ref 0.40–1.20)
GFR: 116.95 mL/min (ref >60.00)
Glucose, Bld: 104 mg/dL — ABNORMAL HIGH (ref 70–99)
POTASSIUM: 4.1 meq/L (ref 3.5–5.1)
SODIUM: 143 meq/L (ref 135–145)

## 2015-06-19 LAB — HEPATIC FUNCTION PANEL
ALT: 16 U/L (ref 0–35)
AST: 15 U/L (ref 0–37)
Albumin: 4.2 g/dL (ref 3.5–5.2)
Alkaline Phosphatase: 102 U/L (ref 39–117)
Bilirubin, Direct: 0.1 mg/dL (ref 0.0–0.3)
Total Bilirubin: 0.5 mg/dL (ref 0.2–1.2)
Total Protein: 6.8 g/dL (ref 6.0–8.3)

## 2015-06-19 LAB — CBC WITH DIFFERENTIAL/PLATELET
Basophils Absolute: 0 K/uL (ref 0.0–0.1)
Basophils Relative: 0.4 % (ref 0.0–3.0)
Eosinophils Absolute: 0.1 K/uL (ref 0.0–0.7)
Eosinophils Relative: 1.2 % (ref 0.0–5.0)
HCT: 35.9 % — ABNORMAL LOW (ref 36.0–46.0)
Hemoglobin: 11.6 g/dL — ABNORMAL LOW (ref 12.0–15.0)
Lymphocytes Relative: 44.5 % (ref 12.0–46.0)
Lymphs Abs: 2 K/uL (ref 0.7–4.0)
MCHC: 32.3 g/dL (ref 30.0–36.0)
MCV: 81.2 fl (ref 78.0–100.0)
Monocytes Absolute: 0.3 K/uL (ref 0.1–1.0)
Monocytes Relative: 6 % (ref 3.0–12.0)
Neutro Abs: 2.2 K/uL (ref 1.4–7.7)
Neutrophils Relative %: 47.9 % (ref 43.0–77.0)
Platelets: 274 K/uL (ref 150.0–400.0)
RBC: 4.43 Mil/uL (ref 3.87–5.11)
RDW: 16 % — ABNORMAL HIGH (ref 11.5–15.5)
WBC: 4.6 K/uL (ref 4.0–10.5)

## 2015-06-19 LAB — POCT URINALYSIS DIPSTICK
Bilirubin, UA: NEGATIVE
Blood, UA: NEGATIVE
GLUCOSE UA: NEGATIVE
KETONES UA: NEGATIVE
LEUKOCYTES UA: NEGATIVE
Nitrite, UA: NEGATIVE
Spec Grav, UA: 1.02
Urobilinogen, UA: 0.2
pH, UA: 8

## 2015-06-19 LAB — TSH: TSH: 1.16 u[IU]/mL (ref 0.35–4.50)

## 2015-06-19 MED ORDER — AMLODIPINE BESYLATE 5 MG PO TABS: ORAL_TABLET | ORAL | Status: DC

## 2015-06-19 MED ORDER — SIMVASTATIN 40 MG PO TABS: ORAL_TABLET | ORAL | Status: DC

## 2015-06-19 MED ORDER — POTASSIUM CHLORIDE ER 10 MEQ PO TBCR: EXTENDED_RELEASE_TABLET | ORAL | Status: DC

## 2015-06-19 MED ORDER — HYDROCHLOROTHIAZIDE 25 MG PO TABS: ORAL_TABLET | ORAL | Status: DC

## 2015-06-19 MED ORDER — MELOXICAM 15 MG PO TABS: ORAL_TABLET | ORAL | Status: DC

## 2015-06-19 MED ORDER — ATENOLOL 50 MG PO TABS: 50.0000 mg | ORAL_TABLET | Freq: Every day | ORAL | Status: DC

## 2015-06-19 NOTE — Addendum Note (Signed)
Addended by: Aggie Hacker A on: 06/19/2015 09:26 AM   Modules accepted: Orders

## 2015-06-19 NOTE — Progress Notes (Signed)
Pre visit review using our clinic review tool, if applicable. No additional management support is needed unless otherwise documented below in the visit note. 

## 2015-06-19 NOTE — Progress Notes (Signed)
   Subjective:    Patient ID: Rachel Frank, female    DOB: 1960/07/19, 54 y.o.   MRN: [id number]  HPI Here to follow up on lipids and BP. She feels well. Her BP is steady at home.    Review of Systems  Constitutional: Negative.   Respiratory: Negative.   Cardiovascular: Negative.   Neurological: Negative.        Objective:   Physical Exam  Constitutional: She is oriented to person, place, and time. She appears well-developed and well-nourished. No distress.  Neck: No thyromegaly present.  Cardiovascular: Normal rate, regular rhythm, normal heart sounds and intact distal pulses.   Pulmonary/Chest: Effort normal and breath sounds normal.  Lymphadenopathy:    She has no cervical adenopathy.  Neurological: She is alert and oriented to person, place, and time.          Assessment & Plan:  Her HTN is stable. Get fasting labs today

## 2015-06-30 ENCOUNTER — Ambulatory Visit
Admission: RE | Admit: 2015-06-30 | Discharge: 2015-06-30 | Disposition: A | Discharge status: Home / Self Care | Payer: Commercial Managed Care - HMO | Source: Ambulatory Visit

## 2015-06-30 DIAGNOSIS — Z1231 Encounter for screening mammogram for malignant neoplasm of breast: Secondary | ICD-10-CM

## 2015-07-14 ENCOUNTER — Other Ambulatory Visit: Payer: Self-pay | Admitting: Family Medicine

## 2016-06-18 ENCOUNTER — Ambulatory Visit (INDEPENDENT_AMBULATORY_CARE_PROVIDER_SITE_OTHER): Payer: 59 | Admitting: Family Medicine

## 2016-06-18 ENCOUNTER — Encounter: Payer: Self-pay | Admitting: Family Medicine

## 2016-06-18 VITALS — BP 138/86 | HR 69 | Temp 98.2°F | Ht 62.0 in | Wt 210.0 lb

## 2016-06-18 DIAGNOSIS — Z23 Encounter for immunization: Secondary | ICD-10-CM

## 2016-06-18 DIAGNOSIS — I10 Essential (primary) hypertension: Secondary | ICD-10-CM

## 2016-06-18 DIAGNOSIS — E785 Hyperlipidemia, unspecified: Secondary | ICD-10-CM

## 2016-06-18 LAB — LIPID PANEL
CHOL/HDL RATIO: 3
Cholesterol: 119 mg/dL (ref 0–200)
HDL: 37.7 mg/dL — AB (ref >39.00)
LDL Cholesterol: 62 mg/dL (ref 0–99)
NonHDL: 81.49
TRIGLYCERIDES: 95 mg/dL (ref 0.0–149.0)
VLDL: 19 mg/dL (ref 0.0–40.0)

## 2016-06-18 LAB — CBC WITH DIFFERENTIAL/PLATELET
BASOS ABS: 0 10*3/uL (ref 0.0–0.1)
Basophils Relative: 0.5 % (ref 0.0–3.0)
EOS ABS: 0 10*3/uL (ref 0.0–0.7)
Eosinophils Relative: 0.9 % (ref 0.0–5.0)
HCT: 34.2 % — ABNORMAL LOW (ref 36.0–46.0)
HEMOGLOBIN: 11.3 g/dL — AB (ref 12.0–15.0)
Lymphocytes Relative: 46.2 % — ABNORMAL HIGH (ref 12.0–46.0)
Lymphs Abs: 2.6 10*3/uL (ref 0.7–4.0)
MCHC: 32.9 g/dL (ref 30.0–36.0)
MCV: 78.3 fl (ref 78.0–100.0)
MONO ABS: 0.3 10*3/uL (ref 0.1–1.0)
Monocytes Relative: 6.1 % (ref 3.0–12.0)
NEUTROS PCT: 46.3 % (ref 43.0–77.0)
Neutro Abs: 2.6 10*3/uL (ref 1.4–7.7)
Platelets: 290 10*3/uL (ref 150.0–400.0)
RBC: 4.37 Mil/uL (ref 3.87–5.11)
RDW: 16.8 % — ABNORMAL HIGH (ref 11.5–15.5)
WBC: 5.6 10*3/uL (ref 4.0–10.5)

## 2016-06-18 LAB — BASIC METABOLIC PANEL
BUN: 11 mg/dL (ref 6–23)
CHLORIDE: 104 meq/L (ref 96–112)
CO2: 28 mEq/L (ref 19–32)
CREATININE: 0.74 mg/dL (ref 0.40–1.20)
Calcium: 9.2 mg/dL (ref 8.4–10.5)
GFR: 103.9 mL/min (ref >60.00)
GLUCOSE: 118 mg/dL — AB (ref 70–99)
Potassium: 4.2 mEq/L (ref 3.5–5.1)
Sodium: 141 mEq/L (ref 135–145)

## 2016-06-18 LAB — HEPATIC FUNCTION PANEL
ALBUMIN: 4 g/dL (ref 3.5–5.2)
ALT: 19 U/L (ref 0–35)
AST: 14 U/L (ref 0–37)
Alkaline Phosphatase: 88 U/L (ref 39–117)
BILIRUBIN TOTAL: 0.4 mg/dL (ref 0.2–1.2)
Bilirubin, Direct: 0.1 mg/dL (ref 0.0–0.3)
TOTAL PROTEIN: 6.4 g/dL (ref 6.0–8.3)

## 2016-06-18 LAB — TSH: TSH: 0.96 u[IU]/mL (ref 0.35–4.50)

## 2016-06-18 MED ORDER — POTASSIUM CHLORIDE ER 10 MEQ PO TBCR: EXTENDED_RELEASE_TABLET | ORAL | 3 refills | Status: DC

## 2016-06-18 MED ORDER — HYDROCHLOROTHIAZIDE 25 MG PO TABS: ORAL_TABLET | ORAL | 3 refills | Status: DC

## 2016-06-18 MED ORDER — AMLODIPINE BESYLATE 5 MG PO TABS: ORAL_TABLET | ORAL | 3 refills | Status: DC

## 2016-06-18 MED ORDER — ATENOLOL 50 MG PO TABS: 50.0000 mg | ORAL_TABLET | Freq: Every day | ORAL | 3 refills | Status: DC

## 2016-06-18 MED ORDER — SIMVASTATIN 40 MG PO TABS: ORAL_TABLET | ORAL | 3 refills | Status: DC

## 2016-06-18 MED ORDER — MELOXICAM 15 MG PO TABS: ORAL_TABLET | ORAL | 3 refills | Status: DC

## 2016-06-18 NOTE — Progress Notes (Signed)
   Subjective:    Patient ID: Rachel Frank, female    DOB: [birth year], 55 y.o.   MRN: [id number]  HPI Here to follow up on HTN and lipids. Her BP has been stable and she feels well.    Review of Systems  Constitutional: Negative.   Respiratory: Negative.   Neurological: Negative.        Objective:   Physical Exam  Constitutional: She is oriented to person, place, and time. She appears well-developed and well-nourished.  Cardiovascular: Normal rate, regular rhythm, normal heart sounds and intact distal pulses.   Pulmonary/Chest: Effort normal and breath sounds normal.  Musculoskeletal: She exhibits no edema.  Neurological: She is alert and oriented to person, place, and time.          Assessment & Plan:  Her HTN is well controlled. Get fasting labs today.  [name], MD

## 2016-06-18 NOTE — Progress Notes (Signed)
Pre visit review using our clinic review tool, if applicable. No additional management support is needed unless otherwise documented below in the visit note. 

## 2016-07-06 ENCOUNTER — Other Ambulatory Visit: Payer: Self-pay | Admitting: Family Medicine

## 2016-08-06 ENCOUNTER — Other Ambulatory Visit: Payer: Self-pay | Admitting: Family Medicine

## 2016-08-06 NOTE — Telephone Encounter (Signed)
Request from [facility], Atenolol 50 mg is on backorder. Per Dr. Sarajane Jews order Atenolol 100 mg take 1/2 tablet once a day # 45 with 3 refills.

## 2016-08-07 MED ORDER — ATENOLOL 100 MG PO TABS: 50.0000 mg | ORAL_TABLET | Freq: Every day | ORAL | 3 refills | Status: DC

## 2016-08-07 NOTE — Telephone Encounter (Signed)
I spoke with pt went over new script, sent e-scribe to [facility] and updated medication change in chart.

## 2017-06-02 ENCOUNTER — Telehealth: Payer: Self-pay

## 2017-06-02 NOTE — Telephone Encounter (Signed)
Fax came in on 06/02/2017 for pota Chloride ER 10 MEQ tab. Refill authorization request.

## 2017-07-28 ENCOUNTER — Other Ambulatory Visit: Payer: Self-pay | Admitting: Family Medicine

## 2017-07-28 NOTE — Telephone Encounter (Signed)
Last OV 06/21/2016.  Atenolol was last refilled 08/07/2016 disp 45, 3 refills.  Amlodipine, simvastatin, and HCTZ, last refilled 06/18/2016 disp 90 with 3 reflls.  Sent to PCP for approval.  Will call pt to advise they are due for an OV

## 2017-08-05 ENCOUNTER — Other Ambulatory Visit: Payer: Self-pay | Admitting: Family Medicine

## 2017-08-06 ENCOUNTER — Telehealth: Payer: Self-pay | Admitting: Family Medicine

## 2017-08-06 NOTE — Telephone Encounter (Signed)
Last OV 06/18/2016

## 2017-08-06 NOTE — Telephone Encounter (Signed)
Sent to PCP for the OK to fill medication. Pt hasn't been seen since 2017

## 2017-08-06 NOTE — Telephone Encounter (Signed)
Copied from [place]. Topic: Quick Communication - See Telephone Encounter >> Aug 06, 2017  9:55 AM Ivar Drape wrote: CRM for notification. See Telephone encounter for:  08/06/17. Patient would like to talk to Dr. Barbie Banner CMA.  She needs a refill on her potassium chloride (K-DUR) 10 MEQ tablets medication.  Her pharmacist [facility] in [place] [facility] said she needs an appt before the medication can be refilled.  She stated she doesn't have insurance right now to come in for an appt and she needs to talk to the [place] about it.

## 2017-08-07 MED ORDER — POTASSIUM CHLORIDE ER 10 MEQ PO TBCR: EXTENDED_RELEASE_TABLET | ORAL | 0 refills | Status: DC

## 2017-08-07 NOTE — Telephone Encounter (Signed)
Call in a 30 day supply but no more without an OV

## 2017-08-07 NOTE — Telephone Encounter (Signed)
Rx has been sent. Spoke with pt. Pt advised that she will need an OV for more refills.

## 2017-10-21 ENCOUNTER — Ambulatory Visit (INDEPENDENT_AMBULATORY_CARE_PROVIDER_SITE_OTHER): Payer: No Typology Code available for payment source | Admitting: Family Medicine

## 2017-10-21 ENCOUNTER — Encounter: Payer: Self-pay | Admitting: Family Medicine

## 2017-10-21 ENCOUNTER — Other Ambulatory Visit (INDEPENDENT_AMBULATORY_CARE_PROVIDER_SITE_OTHER): Payer: No Typology Code available for payment source

## 2017-10-21 VITALS — BP 118/62 | HR 102 | Temp 100.6°F | Ht 62.0 in | Wt 204.6 lb

## 2017-10-21 DIAGNOSIS — D649 Anemia, unspecified: Secondary | ICD-10-CM

## 2017-10-21 DIAGNOSIS — J209 Acute bronchitis, unspecified: Secondary | ICD-10-CM | POA: Diagnosis not present

## 2017-10-21 DIAGNOSIS — E782 Mixed hyperlipidemia: Secondary | ICD-10-CM

## 2017-10-21 DIAGNOSIS — I10 Essential (primary) hypertension: Secondary | ICD-10-CM

## 2017-10-21 LAB — CBC WITH DIFFERENTIAL/PLATELET
BASOS ABS: 0 10*3/uL (ref 0.0–0.1)
Basophils Relative: 0.4 % (ref 0.0–3.0)
Eosinophils Absolute: 0 10*3/uL (ref 0.0–0.7)
Eosinophils Relative: 0 % (ref 0.0–5.0)
HCT: 21.7 % — CL (ref 36.0–46.0)
Hemoglobin: 6.5 g/dL — CL (ref 12.0–15.0)
LYMPHS ABS: 1.2 10*3/uL (ref 0.7–4.0)
Lymphocytes Relative: 15.5 % (ref 12.0–46.0)
MCHC: 29.8 g/dL — AB (ref 30.0–36.0)
MONO ABS: 0.6 10*3/uL (ref 0.1–1.0)
MONOS PCT: 7.1 % (ref 3.0–12.0)
NEUTROS PCT: 77 % (ref 43.0–77.0)
Neutro Abs: 6 10*3/uL (ref 1.4–7.7)
PLATELETS: 348 10*3/uL (ref 150.0–400.0)
RBC: 3.66 Mil/uL — AB (ref 3.87–5.11)
RDW: 17.8 % — ABNORMAL HIGH (ref 11.5–15.5)
WBC: 7.8 10*3/uL (ref 4.0–10.5)

## 2017-10-21 LAB — BASIC METABOLIC PANEL
BUN: 12 mg/dL (ref 6–23)
CHLORIDE: 99 meq/L (ref 96–112)
CO2: 28 meq/L (ref 19–32)
CREATININE: 0.91 mg/dL (ref 0.40–1.20)
Calcium: 8.6 mg/dL (ref 8.4–10.5)
GFR: 81.46 mL/min (ref >60.00)
GLUCOSE: 99 mg/dL (ref 70–99)
POTASSIUM: 3.8 meq/L (ref 3.5–5.1)
Sodium: 138 mEq/L (ref 135–145)

## 2017-10-21 LAB — POC URINALSYSI DIPSTICK (AUTOMATED)
Bilirubin, UA: NEGATIVE
Blood, UA: NEGATIVE
Glucose, UA: NEGATIVE
Leukocytes, UA: NEGATIVE
Nitrite, UA: NEGATIVE
PH UA: 6 (ref 5.0–8.0)
SPEC GRAV UA: 1.02 (ref 1.010–1.025)
Urobilinogen, UA: 1 E.U./dL

## 2017-10-21 LAB — HEPATIC FUNCTION PANEL
ALBUMIN: 4 g/dL (ref 3.5–5.2)
ALT: 11 U/L (ref 0–35)
AST: 16 U/L (ref 0–37)
Alkaline Phosphatase: 71 U/L (ref 39–117)
Bilirubin, Direct: 0.2 mg/dL (ref 0.0–0.3)
TOTAL PROTEIN: 6.7 g/dL (ref 6.0–8.3)
Total Bilirubin: 0.9 mg/dL (ref 0.2–1.2)

## 2017-10-21 LAB — LIPID PANEL
CHOL/HDL RATIO: 3
CHOLESTEROL: 81 mg/dL (ref 0–200)
HDL: 27.8 mg/dL — AB (ref >39.00)
LDL Cholesterol: 38 mg/dL (ref 0–99)
NonHDL: 53.17
TRIGLYCERIDES: 77 mg/dL (ref 0.0–149.0)
VLDL: 15.4 mg/dL (ref 0.0–40.0)

## 2017-10-21 LAB — FOLATE: FOLATE: 21.5 ng/mL (ref >5.9)

## 2017-10-21 LAB — IRON: IRON: 13 ug/dL — AB (ref 42–145)

## 2017-10-21 LAB — TSH: TSH: 0.55 u[IU]/mL (ref 0.35–4.50)

## 2017-10-21 LAB — FERRITIN: Ferritin: 16.2 ng/mL (ref 10.0–291.0)

## 2017-10-21 LAB — VITAMIN B12: Vitamin B-12: 636 pg/mL (ref 211–911)

## 2017-10-21 MED ORDER — HYDROCODONE-HOMATROPINE 5-1.5 MG/5ML PO SYRP: 5.0000 mL | ORAL_SOLUTION | ORAL | 0 refills | Status: DC | PRN

## 2017-10-21 MED ORDER — POTASSIUM CHLORIDE ER 10 MEQ PO TBCR: EXTENDED_RELEASE_TABLET | ORAL | 3 refills | Status: DC

## 2017-10-21 MED ORDER — AZITHROMYCIN 250 MG PO TABS: ORAL_TABLET | ORAL | 0 refills | Status: DC

## 2017-10-21 NOTE — Progress Notes (Signed)
   Subjective:    Patient ID: Rachel Frank, female    DOB: [birth year], 57 y.o.   MRN: [id number]  HPI Here to follow up on BP and lipids, but also to complain of coughing for the past 3 weeks. This is dry but unrelenting. Some fever. Using Tylenol and Robitussin.    Review of Systems  Constitutional: Positive for fever.  HENT: Negative.   Eyes: Negative.   Respiratory: Positive for cough.   Cardiovascular: Negative.   Neurological: Negative.        Objective:   Physical Exam  Constitutional: She is oriented to person, place, and time. She appears well-developed and well-nourished.  HENT:  Right Ear: External ear normal.  Left Ear: External ear normal.  Nose: Nose normal.  Mouth/Throat: Oropharynx is clear and moist.  Eyes: Conjunctivae are normal.  Neck: No thyromegaly present.  Cardiovascular: Normal rate, regular rhythm, normal heart sounds and intact distal pulses.  Pulmonary/Chest: Effort normal and breath sounds normal. No respiratory distress. She has no rales.  Lymphadenopathy:    She has no cervical adenopathy.  Neurological: She is alert and oriented to person, place, and time.          Assessment & Plan:  Treat the bronchitis with a Zpack. Her HTN is stable. Get fasting labs today to check lipids, potassium, etc.  Alysia Penna, MD

## 2017-10-21 NOTE — Progress Notes (Unsigned)
iron

## 2017-10-22 ENCOUNTER — Other Ambulatory Visit: Payer: No Typology Code available for payment source

## 2017-10-22 DIAGNOSIS — D729 Disorder of white blood cells, unspecified: Secondary | ICD-10-CM

## 2017-10-23 LAB — PATHOLOGIST SMEAR REVIEW

## 2017-10-28 ENCOUNTER — Other Ambulatory Visit: Payer: Self-pay

## 2017-10-28 MED ORDER — FERROUS SULFATE 325 (65 FE) MG PO TABS: 325.0000 mg | ORAL_TABLET | Freq: Three times a day (TID) | ORAL | 0 refills | Status: DC

## 2017-10-28 NOTE — Telephone Encounter (Signed)
ferrous sulfate 325 mg to take TID, #90

## 2018-04-07 ENCOUNTER — Other Ambulatory Visit: Payer: Self-pay | Admitting: Family Medicine

## 2018-04-15 ENCOUNTER — Other Ambulatory Visit: Payer: Self-pay | Admitting: *Deleted

## 2018-04-15 MED ORDER — FERROUS SULFATE 325 (65 FE) MG PO TABS: 325.0000 mg | ORAL_TABLET | Freq: Three times a day (TID) | ORAL | 0 refills | Status: DC

## 2018-08-27 ENCOUNTER — Other Ambulatory Visit: Payer: Self-pay | Admitting: Family Medicine

## 2018-12-14 ENCOUNTER — Other Ambulatory Visit: Payer: Self-pay | Admitting: Family Medicine

## 2018-12-18 ENCOUNTER — Other Ambulatory Visit: Payer: Self-pay

## 2018-12-18 ENCOUNTER — Ambulatory Visit (INDEPENDENT_AMBULATORY_CARE_PROVIDER_SITE_OTHER): Payer: No Typology Code available for payment source | Admitting: Family Medicine

## 2018-12-18 ENCOUNTER — Encounter: Payer: Self-pay | Admitting: Family Medicine

## 2018-12-18 DIAGNOSIS — I10 Essential (primary) hypertension: Secondary | ICD-10-CM

## 2018-12-18 DIAGNOSIS — R059 Cough, unspecified: Secondary | ICD-10-CM

## 2018-12-18 DIAGNOSIS — E782 Mixed hyperlipidemia: Secondary | ICD-10-CM | POA: Diagnosis not present

## 2018-12-18 DIAGNOSIS — R05 Cough: Secondary | ICD-10-CM | POA: Diagnosis not present

## 2018-12-18 MED ORDER — HYDROCHLOROTHIAZIDE 25 MG PO TABS: 25.0000 mg | ORAL_TABLET | Freq: Every day | ORAL | 3 refills | Status: DC

## 2018-12-18 MED ORDER — AMLODIPINE BESYLATE 5 MG PO TABS: 5.0000 mg | ORAL_TABLET | Freq: Every day | ORAL | 3 refills | Status: DC

## 2018-12-18 MED ORDER — SIMVASTATIN 40 MG PO TABS: 40.0000 mg | ORAL_TABLET | Freq: Every day | ORAL | 3 refills | Status: DC

## 2018-12-18 MED ORDER — ATENOLOL 50 MG PO TABS: ORAL_TABLET | ORAL | 3 refills | Status: DC

## 2018-12-18 MED ORDER — POTASSIUM CHLORIDE ER 10 MEQ PO TBCR: EXTENDED_RELEASE_TABLET | ORAL | 3 refills | Status: DC

## 2018-12-18 NOTE — Progress Notes (Signed)
Subjective:    Patient ID: Rachel Frank, female    DOB: 1960/08/21, 58 y.o.   MRN: [id number]  HPI Virtual Visit via Video Note  I connected with the patient on 12/18/18 at  2:15 PM EDT by a video enabled telemedicine application and verified that I am speaking with the correct person using two identifiers.  Location patient: home Location provider:work or home office Persons participating in the virtual visit: patient, provider  I discussed the limitations of evaluation and management by telemedicine and the availability of in person appointments. The patient expressed understanding and agreed to proceed.   HPI: Here for medication refills. She feels good except she mentions a slight dry cough that comes and goes. She denies any chest pain or SOB. She has mild swelling in the feet, but this has been present for years. Her BP has been stable in the 130s or 140s over 80s.    ROS: See pertinent positives and negatives per HPI.  Past Medical History:  Diagnosis Date  . Anemia   . Gynecological examination    [facility]  . Heart murmur   . Hyperlipidemia    on simvastatin  . Hypertension     Past Surgical History:  Procedure Laterality Date  . c section    . COLONOSCOPY  09-16-14   per Dr. Ardis Hughs, adenomatous polyp, repeat in 3 months   . HEMORROIDECTOMY      Family History  Problem Relation Age of Onset  . Hypertension Other   . Cancer Other        lung, prostate  . Colon cancer Neg Hx   . Colon polyps Neg Hx      Current Outpatient Medications:  .  amLODipine (NORVASC) 5 MG tablet, Take 1 tablet (5 mg total) by mouth daily., Disp: 90 tablet, Rfl: 3 .  atenolol (TENORMIN) 50 MG tablet, TAKE ONE TABLET BY MOUTH EACH DAY, Disp: 90 tablet, Rfl: 3 .  hydrochlorothiazide (HYDRODIURIL) 25 MG tablet, Take 1 tablet (25 mg total) by mouth daily., Disp: 90 tablet, Rfl: 3 .  Multiple Vitamin (MULTIVITAMIN) tablet, Take 1 tablet by mouth daily.  ,  Disp: , Rfl:  .  OVER THE COUNTER MEDICATION, Take 1 tablet by mouth daily. Dietary supplement -garcinia cambogia, Disp: , Rfl:  .  potassium chloride (K-DUR) 10 MEQ tablet, Take 1 tablet by mouth  daily, Disp: 90 tablet, Rfl: 3 .  simvastatin (ZOCOR) 40 MG tablet, Take 1 tablet (40 mg total) by mouth at bedtime., Disp: 90 tablet, Rfl: 3  EXAM:  VITALS per patient if applicable:  GENERAL: alert, oriented, appears well and in no acute distress  HEENT: atraumatic, conjunttiva clear, no obvious abnormalities on inspection of external nose and ears  NECK: normal movements of the head and neck  LUNGS: on inspection no signs of respiratory distress, breathing rate appears normal, no obvious gross SOB, gasping or wheezing  CV: no obvious cyanosis  MS: moves all visible extremities without noticeable abnormality  PSYCH/NEURO: pleasant and cooperative, no obvious depression or anxiety, speech and thought processing grossly intact  ASSESSMENT AND PLAN: Her HTN seems to be stable. We will refill her meds. I encouraged her to set up a complete physical with labs soon, and we can evaluate the cough more fully at that time.  Alysia Penna, MD  Discussed the following assessment and plan:  No diagnosis found.     I discussed the assessment and treatment plan with the  patient. The patient was provided an opportunity to ask questions and all were answered. The patient agreed with the plan and demonstrated an understanding of the instructions.   The patient was advised to call back or seek an in-person evaluation if the symptoms worsen or if the condition fails to improve as anticipated.     Review of Systems     Objective:   Physical Exam        Assessment & Plan:

## 2019-08-20 ENCOUNTER — Other Ambulatory Visit: Payer: Self-pay

## 2019-08-20 ENCOUNTER — Ambulatory Visit (INDEPENDENT_AMBULATORY_CARE_PROVIDER_SITE_OTHER): Payer: PRIVATE HEALTH INSURANCE | Admitting: Family Medicine

## 2019-08-20 ENCOUNTER — Encounter: Payer: Self-pay | Admitting: Family Medicine

## 2019-08-20 ENCOUNTER — Ambulatory Visit (INDEPENDENT_AMBULATORY_CARE_PROVIDER_SITE_OTHER): Payer: PRIVATE HEALTH INSURANCE

## 2019-08-20 VITALS — BP 140/70 | HR 101 | Temp 97.6°F | Wt 195.2 lb

## 2019-08-20 DIAGNOSIS — D649 Anemia, unspecified: Secondary | ICD-10-CM

## 2019-08-20 DIAGNOSIS — R0602 Shortness of breath: Secondary | ICD-10-CM | POA: Diagnosis not present

## 2019-08-20 DIAGNOSIS — E782 Mixed hyperlipidemia: Secondary | ICD-10-CM | POA: Diagnosis not present

## 2019-08-20 DIAGNOSIS — R739 Hyperglycemia, unspecified: Secondary | ICD-10-CM | POA: Diagnosis not present

## 2019-08-20 DIAGNOSIS — I10 Essential (primary) hypertension: Secondary | ICD-10-CM | POA: Diagnosis not present

## 2019-08-20 DIAGNOSIS — D509 Iron deficiency anemia, unspecified: Secondary | ICD-10-CM

## 2019-08-20 LAB — IBC + FERRITIN
Ferritin: 183.4 ng/mL (ref 10.0–291.0)
Iron: <10 ug/dL — ABNORMAL LOW (ref 42–145)
Saturation Ratios: 3.3 % — ABNORMAL LOW (ref 20.0–50.0)
Transferrin: 218 mg/dL (ref 212.0–360.0)

## 2019-08-20 LAB — HEPATIC FUNCTION PANEL
ALT: 47 U/L — ABNORMAL HIGH (ref 0–35)
AST: 53 U/L — ABNORMAL HIGH (ref 0–37)
Albumin: 3.4 g/dL — ABNORMAL LOW (ref 3.5–5.2)
Alkaline Phosphatase: 424 U/L — ABNORMAL HIGH (ref 39–117)
Bilirubin, Direct: 0.2 mg/dL (ref 0.0–0.3)
Total Bilirubin: 0.5 mg/dL (ref 0.2–1.2)
Total Protein: 8.1 g/dL (ref 6.0–8.3)

## 2019-08-20 LAB — LIPID PANEL
Cholesterol: 162 mg/dL (ref 0–200)
HDL: 23.3 mg/dL — ABNORMAL LOW (ref >39.00)
LDL Cholesterol: 117 mg/dL — ABNORMAL HIGH (ref 0–99)
NonHDL: 138.58
Total CHOL/HDL Ratio: 7
Triglycerides: 107 mg/dL (ref 0.0–149.0)
VLDL: 21.4 mg/dL (ref 0.0–40.0)

## 2019-08-20 LAB — CBC WITH DIFFERENTIAL/PLATELET
Basophils Absolute: 0.1 10*3/uL (ref 0.0–0.1)
Basophils Relative: 0.8 % (ref 0.0–3.0)
Eosinophils Absolute: 0.1 10*3/uL (ref 0.0–0.7)
Eosinophils Relative: 1.8 % (ref 0.0–5.0)
HCT: 30.1 % — ABNORMAL LOW (ref 36.0–46.0)
Hemoglobin: 9.2 g/dL — ABNORMAL LOW (ref 12.0–15.0)
Lymphocytes Relative: 16.3 % (ref 12.0–46.0)
Lymphs Abs: 1.1 10*3/uL (ref 0.7–4.0)
MCHC: 30.6 g/dL (ref 30.0–36.0)
MCV: 69 fl — ABNORMAL LOW (ref 78.0–100.0)
Monocytes Absolute: 0.4 10*3/uL (ref 0.1–1.0)
Monocytes Relative: 6.4 % (ref 3.0–12.0)
Neutro Abs: 4.9 10*3/uL (ref 1.4–7.7)
Neutrophils Relative %: 74.7 % (ref 43.0–77.0)
Platelets: 476 10*3/uL — ABNORMAL HIGH (ref 150.0–400.0)
RBC: 4.37 Mil/uL (ref 3.87–5.11)
RDW: 17 % — ABNORMAL HIGH (ref 11.5–15.5)
WBC: 6.5 10*3/uL (ref 4.0–10.5)

## 2019-08-20 LAB — BASIC METABOLIC PANEL
BUN: 10 mg/dL (ref 6–23)
CO2: 25 mEq/L (ref 19–32)
Calcium: 9.2 mg/dL (ref 8.4–10.5)
Chloride: 99 mEq/L (ref 96–112)
Creatinine, Ser: 0.77 mg/dL (ref 0.40–1.20)
GFR: 92.36 mL/min (ref >60.00)
Glucose, Bld: 99 mg/dL (ref 70–99)
Potassium: 3.9 mEq/L (ref 3.5–5.1)
Sodium: 135 mEq/L (ref 135–145)

## 2019-08-20 LAB — VITAMIN B12: Vitamin B-12: 741 pg/mL (ref 211–911)

## 2019-08-20 LAB — T4, FREE: Free T4: 1.18 ng/dL (ref 0.60–1.60)

## 2019-08-20 LAB — TSH: TSH: 1.9 u[IU]/mL (ref 0.35–4.50)

## 2019-08-20 LAB — HEMOGLOBIN A1C: Hgb A1c MFr Bld: 6.9 % — ABNORMAL HIGH (ref 4.6–6.5)

## 2019-08-20 LAB — T3, FREE: T3, Free: 3.1 pg/mL (ref 2.3–4.2)

## 2019-08-20 LAB — IRON: Iron: <10 ug/dL — ABNORMAL LOW (ref 42–145)

## 2019-08-20 MED ORDER — HYDROCHLOROTHIAZIDE 25 MG PO TABS: 25.0000 mg | ORAL_TABLET | Freq: Every day | ORAL | 3 refills | Status: DC

## 2019-08-20 MED ORDER — ATENOLOL 50 MG PO TABS: ORAL_TABLET | ORAL | 3 refills | Status: DC

## 2019-08-20 MED ORDER — SIMVASTATIN 40 MG PO TABS: 40.0000 mg | ORAL_TABLET | Freq: Every day | ORAL | 3 refills | Status: DC

## 2019-08-20 MED ORDER — AMLODIPINE BESYLATE 5 MG PO TABS: 5.0000 mg | ORAL_TABLET | Freq: Every day | ORAL | 3 refills | Status: DC

## 2019-08-20 MED ORDER — POTASSIUM CHLORIDE ER 10 MEQ PO TBCR: EXTENDED_RELEASE_TABLET | ORAL | 3 refills | Status: DC

## 2019-08-20 NOTE — Addendum Note (Signed)
Addended by: Suzette Battiest on: 08/20/2019 10:05 AM   Modules accepted: Orders

## 2019-08-20 NOTE — Progress Notes (Signed)
   Subjective:    Patient ID: Rachel Frank, female    DOB: [birth year], 60 y.o.   MRN: [id number]  HPI Here to follow up. Her main complaint today is SOB on exertion. This has been bothering her for about 2 years, but it seems to be getting worse. Whenever she walks up a flight of stairs or takes her trash out to the curb, she has to sit down and catch her breath. She occasionally has some mild chest pressure with this, but never a pain as such. No palpitations or fluttering in the chest. No swelling in the feet or ankles. Her BP has been stable at home. She has a hx of anemia and she takes an OTC iron pill daily, but this has not been checked for several years.    Review of Systems  Constitutional: Negative.   Respiratory: Positive for shortness of breath. Negative for cough and wheezing.   Cardiovascular: Negative.   Gastrointestinal: Negative.   Genitourinary: Negative.   Neurological: Negative.        Objective:   Physical Exam Constitutional:      Appearance: Normal appearance. She is not ill-appearing.  Cardiovascular:     Rate and Rhythm: Normal rate and regular rhythm.     Pulses: Normal pulses.     Heart sounds: Normal heart sounds.     Comments: EKG is within normal limits  Pulmonary:     Effort: Pulmonary effort is normal. No respiratory distress.     Breath sounds: Normal breath sounds. No stridor. No wheezing, rhonchi or rales.  Chest:     Chest wall: No tenderness.  Musculoskeletal:     Right lower leg: No edema.     Left lower leg: No edema.  Neurological:     General: No focal deficit present.     Mental Status: She is alert and oriented to person, place, and time.           Assessment & Plan:  Her HTN is stable and meds were refilled. She is fasting so we will get labs to include lipids. The SOB could have many etiologies but I suspect she may be quite anemic again. We will check CBC, ferritin, iron, etc. Get a CXR today.  Alysia Penna, MD

## 2019-08-23 ENCOUNTER — Telehealth: Payer: Self-pay | Admitting: Family Medicine

## 2019-08-23 NOTE — Telephone Encounter (Signed)
Pt is calling wanting to know what her lab results are and is there is any new medication sent in for her.  Pt is aware that lab results has not come back yet.  Pt would like to have a call back as soon as possible.

## 2019-08-23 NOTE — Telephone Encounter (Signed)
Noted. Will call patient as soon as results are available.

## 2019-08-25 ENCOUNTER — Telehealth: Payer: Self-pay | Admitting: Family Medicine

## 2019-08-25 NOTE — Addendum Note (Signed)
Addended by: Alysia Penna A on: 08/25/2019 07:51 AM   Modules accepted: Orders

## 2019-08-25 NOTE — Telephone Encounter (Signed)
Labs were sent to mychart. Will call and discuss this with her.

## 2019-08-25 NOTE — Telephone Encounter (Signed)
Pt called again today and states she has a cough that has not gone away. She has been doing over the counter medicines for 15 days and none has helped. She is wondering if she can be prescribed cough syrup?  She has been getting Diarrhea as well and been taking Pepto   She is also still waiting to hear back from her lab results.  Pt can be reached at [phone]

## 2019-08-26 ENCOUNTER — Telehealth: Payer: Self-pay | Admitting: Physician Assistant

## 2019-08-26 NOTE — Telephone Encounter (Signed)
A new hem appt has been scheduled for Rachel Frank to see [name] on 2/17 at 1:30pm. Pt aware to arrive 15 minutes early.

## 2019-08-27 ENCOUNTER — Telehealth: Payer: Self-pay | Admitting: Family Medicine

## 2019-08-27 MED ORDER — HYDROCODONE-HOMATROPINE 5-1.5 MG/5ML PO SYRP: 5.0000 mL | ORAL_SOLUTION | ORAL | 0 refills | Status: DC | PRN

## 2019-08-27 NOTE — Telephone Encounter (Signed)
Medication refill: Hydrocodone This was called into the wrong pharmacy and pt needs it to be sent to [place]: Rachel Frank [place] Fax: [phone]

## 2019-08-27 NOTE — Telephone Encounter (Signed)
I sent in some cough syrup. The referral to Hematology has been done

## 2019-08-27 NOTE — Telephone Encounter (Signed)
Rx has been sent in. Patient is aware. 

## 2019-08-27 NOTE — Addendum Note (Signed)
Addended by: Alysia Penna A on: 08/27/2019 07:02 AM   Modules accepted: Orders

## 2019-08-30 MED ORDER — HYDROCODONE-HOMATROPINE 5-1.5 MG/5ML PO SYRP: 5.0000 mL | ORAL_SOLUTION | ORAL | 0 refills | Status: DC | PRN

## 2019-08-30 NOTE — Telephone Encounter (Signed)
Patient is aware 

## 2019-08-30 NOTE — Telephone Encounter (Signed)
Pt called and stated that this medication was called into the wrong pharmacy. It needs to be sent to [facility] [address] - [id number]

## 2019-08-30 NOTE — Telephone Encounter (Signed)
I resent this to the [facility]

## 2019-08-31 ENCOUNTER — Other Ambulatory Visit: Payer: Self-pay | Admitting: Medical Oncology

## 2019-08-31 DIAGNOSIS — D649 Anemia, unspecified: Secondary | ICD-10-CM

## 2019-09-01 ENCOUNTER — Inpatient Hospital Stay: Payer: No Typology Code available for payment source

## 2019-09-01 ENCOUNTER — Other Ambulatory Visit: Payer: Self-pay

## 2019-09-01 ENCOUNTER — Telehealth: Payer: Self-pay

## 2019-09-01 ENCOUNTER — Encounter: Payer: Self-pay | Admitting: Physician Assistant

## 2019-09-01 ENCOUNTER — Inpatient Hospital Stay: Payer: No Typology Code available for payment source | Attending: Physician Assistant | Admitting: Physician Assistant

## 2019-09-01 VITALS — BP 135/73 | HR 78 | Temp 97.4°F | Resp 18 | Ht 62.5 in | Wt 192.8 lb

## 2019-09-01 DIAGNOSIS — D649 Anemia, unspecified: Secondary | ICD-10-CM

## 2019-09-01 DIAGNOSIS — R0609 Other forms of dyspnea: Secondary | ICD-10-CM | POA: Diagnosis not present

## 2019-09-01 DIAGNOSIS — R5383 Other fatigue: Secondary | ICD-10-CM | POA: Diagnosis not present

## 2019-09-01 DIAGNOSIS — D509 Iron deficiency anemia, unspecified: Secondary | ICD-10-CM | POA: Insufficient documentation

## 2019-09-01 DIAGNOSIS — R531 Weakness: Secondary | ICD-10-CM | POA: Diagnosis not present

## 2019-09-01 DIAGNOSIS — R0602 Shortness of breath: Secondary | ICD-10-CM

## 2019-09-01 DIAGNOSIS — K921 Melena: Secondary | ICD-10-CM | POA: Diagnosis not present

## 2019-09-01 DIAGNOSIS — Z79899 Other long term (current) drug therapy: Secondary | ICD-10-CM | POA: Insufficient documentation

## 2019-09-01 DIAGNOSIS — I10 Essential (primary) hypertension: Secondary | ICD-10-CM | POA: Diagnosis not present

## 2019-09-01 DIAGNOSIS — Z1159 Encounter for screening for other viral diseases: Secondary | ICD-10-CM

## 2019-09-01 DIAGNOSIS — E785 Hyperlipidemia, unspecified: Secondary | ICD-10-CM | POA: Diagnosis not present

## 2019-09-01 DIAGNOSIS — Z8249 Family history of ischemic heart disease and other diseases of the circulatory system: Secondary | ICD-10-CM

## 2019-09-01 DIAGNOSIS — D120 Benign neoplasm of cecum: Secondary | ICD-10-CM | POA: Insufficient documentation

## 2019-09-01 LAB — CBC WITH DIFFERENTIAL (CANCER CENTER ONLY)
Abs Immature Granulocytes: 0.02 10*3/uL (ref 0.00–0.07)
Basophils Absolute: 0 10*3/uL (ref 0.0–0.1)
Basophils Relative: 1 %
Eosinophils Absolute: 0.2 10*3/uL (ref 0.0–0.5)
Eosinophils Relative: 2 %
HCT: 32 % — ABNORMAL LOW (ref 36.0–46.0)
Hemoglobin: 9.5 g/dL — ABNORMAL LOW (ref 12.0–15.0)
Immature Granulocytes: 0 %
Lymphocytes Relative: 19 %
Lymphs Abs: 1.7 10*3/uL (ref 0.7–4.0)
MCH: 21.3 pg — ABNORMAL LOW (ref 26.0–34.0)
MCHC: 29.7 g/dL — ABNORMAL LOW (ref 30.0–36.0)
MCV: 71.6 fL — ABNORMAL LOW (ref 80.0–100.0)
Monocytes Absolute: 0.7 10*3/uL (ref 0.1–1.0)
Monocytes Relative: 9 %
Neutro Abs: 6.1 10*3/uL (ref 1.7–7.7)
Neutrophils Relative %: 69 %
Platelet Count: 626 10*3/uL — ABNORMAL HIGH (ref 150–400)
RBC: 4.47 MIL/uL (ref 3.87–5.11)
RDW: 17.3 % — ABNORMAL HIGH (ref 11.5–15.5)
WBC Count: 8.7 10*3/uL (ref 4.0–10.5)
nRBC: 0 % (ref 0.0–0.2)

## 2019-09-01 LAB — CMP (CANCER CENTER ONLY)
ALT: 56 U/L — ABNORMAL HIGH (ref 0–44)
AST: 80 U/L — ABNORMAL HIGH (ref 15–41)
Albumin: 2.7 g/dL — ABNORMAL LOW (ref 3.5–5.0)
Alkaline Phosphatase: 539 U/L — ABNORMAL HIGH (ref 38–126)
Anion gap: 11 (ref 5–15)
BUN: 11 mg/dL (ref 6–20)
CO2: 27 mmol/L (ref 22–32)
Calcium: 8.9 mg/dL (ref 8.9–10.3)
Chloride: 99 mmol/L (ref 98–111)
Creatinine: 0.8 mg/dL (ref 0.44–1.00)
GFR, Est AFR Am: >60 mL/min (ref >60)
GFR, Estimated: >60 mL/min (ref >60)
Glucose, Bld: 99 mg/dL (ref 70–99)
Potassium: 3.6 mmol/L (ref 3.5–5.1)
Sodium: 137 mmol/L (ref 135–145)
Total Bilirubin: 0.5 mg/dL (ref 0.3–1.2)
Total Protein: 8.6 g/dL — ABNORMAL HIGH (ref 6.5–8.1)

## 2019-09-01 NOTE — Telephone Encounter (Signed)
-----   Message from Milus Banister, MD sent at 09/01/2019  3:00 PM EST ----- Regarding: RE: Anemia and Colonoscopy Consideration Thanks.    She definitely needs a repeat colonoscopy. We will contact her to arrange it.     Ali Mohl, Can you please contact her about colonoscopy my first available. if it is longer than 3-4 weeks out, please let me know to find an expedited spot.     ----- Message ----- From: Heilingoetter, Tobe Sos, PA-C Sent: 09/01/2019   2:30 PM EST To: Milus Banister, MD Subject: Anemia and Colonoscopy Consideration           Good Afternoon Dr. Ardis Hughs,   I am a PA over at the [facility]. I have a patient who was referred to our clinic for evaluation of iron deficiency anemia. She received her last colonoscopy under your care in March 2016. 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 Your result note indicates she was supposed to have a repeat colonoscopy in 3 months. She was lost to follow up and has not had had a colonoscopy since her initial one in March 2016. We am concerned about possible CRC. She is reporting melena which may also be secondary to her iron supplements. She is also reporting 15 lb weight loss over 3 weeks and mild LUQ abdominal pain. I was reaching out to you for consideration of a repeat colonoscopy at your earliest convenience. Please let me know if there is anything that I can do to help facilitate this process. Thank you for your consideration.   -[name]

## 2019-09-01 NOTE — Progress Notes (Signed)
[place] [phone]   [phone]) [phone]  CONSULT NOTE  REFERRING PHYSICIAN: Dr. Sarajane Jews MD  REASON FOR CONSULTATION:  Iron Deficiency Anemia  HPI Rachel Frank is a 59 y.o. female with a past medical history significant for tubular adenoma polyp, hypertension, and hyperlipidemia is referred to clinic for evaluation of iron deficiency anemia.   The patient states that she was first anemic as an infant which resolved in childhood.  The patient's anemia was noted again when she was a teenager and she had been on taking iron supplements since she was a teenager. The patient denies ever needing a blood transfusion or an iron infusion.  The patient continues to take over-the-counter iron and is compliant with taking her iron supplement.  The patient recently presented to her primary care provider on August 20, 2019 for the chief complaint of worsening shortness of breath on exertion, decreased exercise tolerance, generalized weakness, significant fatigue, and cough.  The patient had a chest x-ray which was negative for any acute abnormalities and she received a prescription for cough medication.  It was suspected that her symptoms were related to iron deficiency. Therefore, she had a CBC performed that day which showed microcytic anemia with a hemoglobin of 9.2 and MCV of 69.  Iron studies revealed significant anemia with a total iron of less than 10, transferritin of 218, iron saturation ratio is low at 3.3%, and ferritin within normal limits at 183.4.  The patient also had folate and B12 drawn which were unremarkable.  The patient was then referred to our clinic today for evaluation regarding her iron deficiency as well as for consideration of IV iron infusions.  The patient's last colonoscopy was in March 2016.  She is followed by Dr. Ardis Hughs at [facility]. In March 2016, her colonoscopy noted a 2.6cm soft, villous appearing polyp located in the cecum. The  final pathology revealed a tubular adenoma with foci of high-grade dysplasia.  It was recommended at that time that the patient have a follow-up colonoscopy in 3 months.  The patient was lost to follow-up at that time and did not complete her repeat colonoscopy.  Today, the patient is endorsing significant fatigue, weakness, dyspnea on exertion, and decreased exercise tolerance.  She denies any hematochezia but notes melena for which she attributes to her iron supplements.  She denies any other bleeding or bruising including epistaxis, hematemesis, vaginal bleeding. hemoptysis, or hematuria.  She has not had a menstrual period or any menstrual bleeding for the last 5 years. She denied any pain at this time. She states earlier this month when she was feeling unwell, she had experienced left upper quadrant and back pain which has resolved at this time.  She denies any nausea or vomiting but had an episode of dry heaving the other day.  She denies any recent fever, chills, or palpable lymphadenopathy.  She reports possible night sweats which she attributes to her home being warm.  She states she lost approximately 15 pounds over the last 3 weeks.  Her last bowel movement was today.   The patient's family history significant for mother with hypertension.  The patient's father has hypertension and also has iron deficiency for which he periodically receives IV iron infusions.  The patient's sister also has iron deficiency in the etiology is unclear.  The patient denies any family history of thalassemia or sickle cell anemia.  The patient is married and has 2 adult children.  She is unemployed.  She  denies any history of drug, tobacco, or alcohol use.   HPI  Past Medical History:  Diagnosis Date  . Anemia   . Gynecological examination    [facility]  . Heart murmur   . Hyperlipidemia    on simvastatin  . Hypertension     Past Surgical History:  Procedure Laterality Date    . c section    . COLONOSCOPY  09-16-14   per Dr. Ardis Hughs, adenomatous polyp, repeat in 3 months   . HEMORROIDECTOMY      Family History  Problem Relation Age of Onset  . Hypertension Other   . Cancer Other        lung, prostate  . Colon cancer Neg Hx   . Colon polyps Neg Hx     Social History Social History   Tobacco Use  . Smoking status: Never Smoker  . Smokeless tobacco: Never Used  Substance Use Topics  . Alcohol use: No    Alcohol/week: 0.0 standard drinks  . Drug use: No    No Known Allergies  Current Outpatient Medications  Medication Sig Dispense Refill  . amLODipine (NORVASC) 5 MG tablet Take 1 tablet (5 mg total) by mouth daily. 90 tablet 3  . atenolol (TENORMIN) 50 MG tablet TAKE ONE TABLET BY MOUTH EACH DAY 90 tablet 3  . hydrochlorothiazide (HYDRODIURIL) 25 MG tablet Take 1 tablet (25 mg total) by mouth daily. 90 tablet 3  . HYDROcodone-homatropine (HYDROMET) 5-1.5 MG/5ML syrup Take 5 mLs by mouth every 4 (four) hours as needed. 240 mL 0  . Multiple Vitamin (MULTIVITAMIN) tablet Take 1 tablet by mouth daily.      Marland Kitchen OVER THE COUNTER MEDICATION Take 1 tablet by mouth daily. Dietary supplement -garcinia cambogia    . potassium chloride (KLOR-CON) 10 MEQ tablet Take 1 tablet by mouth  daily 90 tablet 3  . simvastatin (ZOCOR) 40 MG tablet Take 1 tablet (40 mg total) by mouth at bedtime. 90 tablet 3   No current facility-administered medications for this visit.    Review of Systems REVIEW OF SYSTEMS:   Review of Systems  Constitutional: Positive for fatigue, weight loss, questionable night sweats, and generalized weakness. Negative for chills and fever  HENT: Negative for mouth sores, nosebleeds, sore throat and trouble swallowing.  Eyes: Negative for eye problems and icterus.  Respiratory: Positive for cough and dyspnea on exertion. Negative for hemoptysis and wheezing.   Cardiovascular: Negative for chest pain and leg swelling.  Gastrointestinal: Positive for  mild LUQ pain. Positive for melena. Negative for constipation, diarrhea, nausea and vomiting.  Genitourinary: Negative for bladder incontinence, difficulty urinating, dysuria, frequency and hematuria.   Musculoskeletal: Positive for back pain (resolved). Negative for gait problem, neck pain and neck stiffness.  Skin: Negative for itching and rash.  Neurological: Negative for dizziness, extremity weakness, gait problem, headaches, light-headedness and seizures.  Hematological: Negative for adenopathy. Does not bruise/bleed easily.  Psychiatric/Behavioral: Negative for confusion, depression and sleep disturbance. The patient is not nervous/anxious.     PHYSICAL EXAMINATION:  Blood pressure 135/73, pulse 78, temperature (!) 97.4 F (36.3 C), temperature source Temporal, resp. rate 18, height 5' 2.5" (1.588 m), weight 192 lb 12.8 oz (87.5 kg), SpO2 100 %.  ECOG PERFORMANCE STATUS:   Physical Exam  Constitutional: Oriented to person, place, and time and well-developed, well-nourished, and in no distress.  HENT:  Head: Normocephalic and atraumatic.  Mouth/Throat: Oropharynx is clear and moist. No oropharyngeal exudate.  Eyes:  Conjunctivae are normal. Right eye exhibits no discharge. Left eye exhibits no discharge. No scleral icterus.  Neck: Normal range of motion. Neck supple.  Cardiovascular: Normal rate, regular rhythm, normal heart sounds and intact distal pulses.   Pulmonary/Chest: Effort normal and breath sounds normal. No respiratory distress. No wheezes. No rales.  Abdominal: Soft. Mild tenderness in epigastric/LUQ. Bowel sounds are normal. Exhibits no distension and no mass.  Musculoskeletal: Normal range of motion. Exhibits no edema.  Lymphadenopathy:    No cervical adenopathy.  Neurological: Alert and oriented to person, place, and time. Exhibits normal muscle tone. Gait normal. Coordination normal.  Skin: Skin is warm and dry. No rash noted. Not diaphoretic. No erythema. No pallor.    Psychiatric: Mood, memory and judgment normal.  Vitals reviewed.  LABORATORY DATA: Lab Results  Component Value Date   WBC 8.7 09/01/2019   HGB 9.5 (L) 09/01/2019   HCT 32.0 (L) 09/01/2019   MCV 71.6 (L) 09/01/2019   PLT 626 (H) 09/01/2019      Chemistry      Component Value Date/Time   NA 137 09/01/2019 1333   K 3.6 09/01/2019 1333   CL 99 09/01/2019 1333   CO2 27 09/01/2019 1333   BUN 11 09/01/2019 1333   CREATININE 0.80 09/01/2019 1333      Component Value Date/Time   CALCIUM 8.9 09/01/2019 1333   ALKPHOS 539 (H) 09/01/2019 1333   AST 80 (H) 09/01/2019 1333   ALT 56 (H) 09/01/2019 1333   BILITOT 0.5 09/01/2019 1333       RADIOGRAPHIC STUDIES: DG Chest 2 View  Result Date: 08/20/2019 CLINICAL DATA:  Dyspnea on exertion. EXAM: CHEST - 2 VIEW COMPARISON:  02/21/2015 FINDINGS: The heart size and mediastinal contours are within normal limits. Both lungs are clear. The visualized skeletal structures are unremarkable. IMPRESSION: No active cardiopulmonary disease. Electronically Signed   By: Marlaine Hind M.D.   On: 08/20/2019 15:08    ASSESSMENT: This is a very pleasant 59 year old African-American female referred to the clinic for evaluation of iron deficiency anemia.  PLAN: The patient was seen with Dr. Julien Nordmann today.  The patient repeat CBC and CMP performed today.  The CBC shows persistent microcytic anemia with hemoglobin of 9.5 and elevated platelet count at 626k.   We will arrange for the patient to have 4 weekly doses of IV iron with Venofer for supportive treatment of her iron deficiency anemia starting on 09/06/19; however, the patient's presentation is concerning for underlying GI malignant etiology of her worsening iron deficiency anemia.  Additionally, the patient's LFTs were elevated on CMP today with an AST of 80, ALT of 56, and alk phos of 529.   We have reached out to her gastroenterologist for consideration of repeat colonoscopy. They will arrange for  repeat colonoscopy at their earliest availability.  She also was given FOBT cards today to bring back to the clinic when she returns on 09/06/19.   She was instructed to continue taking her oral iron supplements as well.   We will plan see her back for a follow up visit in 6 weeks for evaluation and repeat CBC, iron studies, and ferritin unless she needs to be seen sooner pending results from her GI evaluation.   The patient voices understanding of current disease status and treatment options and is in agreement with the current care plan.  All questions were answered. The patient knows to call the clinic with any problems, questions or concerns. We can certainly see the  patient much sooner if necessary.  Thank you so much for allowing me to participate in the care of Rachel Frank. I will continue to follow up the patient with you and assist in her care.  The total time spent in the appointment was 60 minutes.  Disclaimer: This note was dictated with voice recognition software. Similar sounding words can inadvertently be transcribed and may not be corrected upon review.   Polo Mcmartin L Katia Hannen September 01, 2019, 2:38 PM  ADDENDUM: Hematology/Oncology Attending: I had a face-to-face encounter with the patient today.  I recommended her care plan.  This is a very pleasant 59 years old white female with history of iron deficiency anemia since she was a teenager.  She has been on oral iron tablet on and off since that time.  She was seen recently by her primary care physician and noticed to have to severe iron deficiency despite the oral iron supplements.  She was referred to me today for evaluation and recommendation regarding her severe iron deficiency anemia.  Repeat CBC today showed hemoglobin of 9.5 with MCV of 71.6% and elevated platelets count reactive to the iron deficiency. The patient also has a history of cecal polyp that was consistent with high-grade dysplasia in March 2016.  She  was supposed to have repeat colonoscopy 3 months later but she did not show up for her appointment.  She continues to have black tarry stool but she attributed it to the oral iron tablets. I recommended for the patient to see Dr. Ardis Hughs her gastroenterologist as soon as possible for consideration of repeat colonoscopy and to rule out the development of any malignancy.  We contacted Dr. Ardis Hughs and he will arrange for the patient to have this procedure soon.  We will check her stool for Hemoccult. Because of the severe iron deficiency and no improvement with the oral iron tablet, we will arrange for the patient to receive iron infusion with Venofer weekly for the next 4 weeks. We will see the patient back for follow-up visit in 6 weeks for evaluation with repeat CBC, iron study and ferritin. The patient was advised to call immediately if she has any other concerning symptoms in the interval.  She is in agreement with the current plan.  Disclaimer: This note was dictated with voice recognition software. Similar sounding words can inadvertently be transcribed and may be missed upon review. Eilleen Kempf, MD 09/01/19

## 2019-09-01 NOTE — Telephone Encounter (Signed)
The pt has been scheduled for previsit, colon and covid test.  The pt has been advised.

## 2019-09-02 ENCOUNTER — Telehealth: Payer: Self-pay | Admitting: Internal Medicine

## 2019-09-02 NOTE — Telephone Encounter (Signed)
Scheduled per los. Called and left msg. Mailed printout  °

## 2019-09-03 ENCOUNTER — Telehealth: Payer: Self-pay | Admitting: *Deleted

## 2019-09-03 NOTE — Telephone Encounter (Signed)
Ok [employer], can you reach out to her to see if she will reschedule?  Thanks

## 2019-09-03 NOTE — Telephone Encounter (Signed)
Dr Ardis Hughs,  This pt did not show for her PV today at 430 pm - I called her at 433 pm, no answer- I called again at 438 pm, no answer- LM both times- she did not call me back by 5 pm today  - - she has her colon 2-23 Tuesday with you for hx high grade dysplasia in 2016 with a 3 month F/U she never did- oncology sent her for IDA and weight loss- was to have ASAP- Patty said she was upset earlier this week when she called her about these visits -   I wanted to let you know she did not do her COV test today at 3 pm, no PV - so I canceled her colon 2- 23 with you as well   Thanks, Lelan Pons PV

## 2019-09-06 ENCOUNTER — Inpatient Hospital Stay: Payer: No Typology Code available for payment source

## 2019-09-06 ENCOUNTER — Encounter: Payer: Self-pay | Admitting: Gastroenterology

## 2019-09-06 ENCOUNTER — Other Ambulatory Visit: Payer: Self-pay

## 2019-09-06 VITALS — BP 148/77 | HR 79 | Temp 97.6°F | Resp 20

## 2019-09-06 DIAGNOSIS — D509 Iron deficiency anemia, unspecified: Secondary | ICD-10-CM

## 2019-09-06 MED ORDER — ACETAMINOPHEN 325 MG PO TABS
ORAL_TABLET | ORAL | Status: AC
  Filled 2019-09-06: qty 2

## 2019-09-06 MED ORDER — SODIUM CHLORIDE 0.9 % IV SOLN
Freq: Once | INTRAVENOUS | Status: AC
  Filled 2019-09-06: qty 250

## 2019-09-06 MED ORDER — DIPHENHYDRAMINE HCL 25 MG PO CAPS
ORAL_CAPSULE | ORAL | Status: AC
  Filled 2019-09-06: qty 2

## 2019-09-06 MED ORDER — SODIUM CHLORIDE 0.9 % IV SOLN
200.0000 mg | Freq: Once | INTRAVENOUS | Status: AC
  Administered 2019-09-06: 200 mg via INTRAVENOUS
  Filled 2019-09-06: qty 200

## 2019-09-06 MED ORDER — ACETAMINOPHEN 325 MG PO TABS
650.0000 mg | ORAL_TABLET | Freq: Once | ORAL | Status: AC
  Administered 2019-09-06: 650 mg via ORAL

## 2019-09-06 MED ORDER — DIPHENHYDRAMINE HCL 25 MG PO CAPS
50.0000 mg | ORAL_CAPSULE | Freq: Once | ORAL | Status: AC
  Administered 2019-09-06: 50 mg via ORAL

## 2019-09-06 NOTE — Patient Instructions (Signed)

## 2019-09-06 NOTE — Telephone Encounter (Signed)
Left message on machine to call back  

## 2019-09-07 ENCOUNTER — Encounter: Payer: PRIVATE HEALTH INSURANCE | Admitting: Gastroenterology

## 2019-09-07 NOTE — Telephone Encounter (Signed)
The pt has been rescheduled.

## 2019-09-13 ENCOUNTER — Inpatient Hospital Stay: Payer: PRIVATE HEALTH INSURANCE | Attending: Physician Assistant

## 2019-09-13 ENCOUNTER — Other Ambulatory Visit: Payer: Self-pay

## 2019-09-13 VITALS — BP 121/67 | HR 76 | Temp 97.8°F | Resp 18

## 2019-09-13 DIAGNOSIS — I10 Essential (primary) hypertension: Secondary | ICD-10-CM | POA: Diagnosis not present

## 2019-09-13 DIAGNOSIS — C787 Secondary malignant neoplasm of liver and intrahepatic bile duct: Secondary | ICD-10-CM | POA: Diagnosis present

## 2019-09-13 DIAGNOSIS — Z79899 Other long term (current) drug therapy: Secondary | ICD-10-CM | POA: Diagnosis not present

## 2019-09-13 DIAGNOSIS — C180 Malignant neoplasm of cecum: Secondary | ICD-10-CM | POA: Diagnosis present

## 2019-09-13 DIAGNOSIS — E785 Hyperlipidemia, unspecified: Secondary | ICD-10-CM | POA: Insufficient documentation

## 2019-09-13 DIAGNOSIS — G893 Neoplasm related pain (acute) (chronic): Secondary | ICD-10-CM | POA: Insufficient documentation

## 2019-09-13 DIAGNOSIS — R918 Other nonspecific abnormal finding of lung field: Secondary | ICD-10-CM | POA: Insufficient documentation

## 2019-09-13 DIAGNOSIS — D509 Iron deficiency anemia, unspecified: Secondary | ICD-10-CM | POA: Diagnosis present

## 2019-09-13 MED ORDER — ACETAMINOPHEN 325 MG PO TABS
ORAL_TABLET | ORAL | Status: AC
  Filled 2019-09-13: qty 2

## 2019-09-13 MED ORDER — DIPHENHYDRAMINE HCL 25 MG PO CAPS
ORAL_CAPSULE | ORAL | Status: AC
  Filled 2019-09-13: qty 1

## 2019-09-13 MED ORDER — ACETAMINOPHEN 325 MG PO TABS
650.0000 mg | ORAL_TABLET | Freq: Once | ORAL | Status: AC
  Administered 2019-09-13: 650 mg via ORAL

## 2019-09-13 MED ORDER — SODIUM CHLORIDE 0.9 % IV SOLN
Freq: Once | INTRAVENOUS | Status: AC
  Filled 2019-09-13: qty 250

## 2019-09-13 MED ORDER — DIPHENHYDRAMINE HCL 25 MG PO CAPS
50.0000 mg | ORAL_CAPSULE | Freq: Once | ORAL | Status: AC
  Administered 2019-09-13: 50 mg via ORAL

## 2019-09-13 MED ORDER — SODIUM CHLORIDE 0.9 % IV SOLN
200.0000 mg | Freq: Once | INTRAVENOUS | Status: AC
  Administered 2019-09-13: 200 mg via INTRAVENOUS
  Filled 2019-09-13: qty 200

## 2019-09-13 NOTE — Patient Instructions (Signed)

## 2019-09-14 ENCOUNTER — Other Ambulatory Visit: Payer: Self-pay

## 2019-09-14 ENCOUNTER — Ambulatory Visit (AMBULATORY_SURGERY_CENTER): Payer: Self-pay

## 2019-09-14 VITALS — Temp 96.6°F | Ht 62.5 in | Wt 192.0 lb

## 2019-09-14 DIAGNOSIS — Z01818 Encounter for other preprocedural examination: Secondary | ICD-10-CM

## 2019-09-14 DIAGNOSIS — Z86010 Personal history of colonic polyps: Secondary | ICD-10-CM

## 2019-09-14 DIAGNOSIS — D509 Iron deficiency anemia, unspecified: Secondary | ICD-10-CM

## 2019-09-14 MED ORDER — NA SULFATE-K SULFATE-MG SULF 17.5-3.13-1.6 GM/177ML PO SOLN: 1.0000 | Freq: Once | ORAL | 0 refills | Status: AC

## 2019-09-14 NOTE — Progress Notes (Signed)
No egg or soy allergy known to patient  No issues with past sedation with any surgeries  or procedures, no intubation problems  No diet pills per patient No home 02 use per patient  No blood thinners per patient  Pt denies issues with constipation  No A fib or A flutter  EMMI video sent to pt's e mail   suprep coupon and code given  Due to the COVID-19 pandemic we are asking patients to follow these guidelines. Please only bring one care partner. Please be aware that your care partner may wait in the car in the parking lot or if they feel like they will be too hot to wait in the car, they may wait in the lobby on the 4th floor. All care partners are required to wear a mask the entire time (we do not have any that we can provide them), they need to practice social distancing, and we will do a Covid check for all patient's and care partners when you arrive. Also we will check their temperature and your temperature. If the care partner waits in their car they need to stay in the parking lot the entire time and we will call them on their cell phone when the patient is ready for discharge so they can bring the car to the front of the building. Also all patient's will need to wear a mask into building.  

## 2019-09-20 ENCOUNTER — Other Ambulatory Visit: Payer: Self-pay

## 2019-09-20 ENCOUNTER — Inpatient Hospital Stay: Payer: PRIVATE HEALTH INSURANCE

## 2019-09-20 VITALS — BP 138/74 | HR 69 | Temp 98.2°F | Resp 17

## 2019-09-20 DIAGNOSIS — D509 Iron deficiency anemia, unspecified: Secondary | ICD-10-CM

## 2019-09-20 MED ORDER — ACETAMINOPHEN 325 MG PO TABS
ORAL_TABLET | ORAL | Status: AC
  Filled 2019-09-20: qty 2

## 2019-09-20 MED ORDER — SODIUM CHLORIDE 0.9 % IV SOLN
Freq: Once | INTRAVENOUS | Status: AC
  Filled 2019-09-20: qty 250

## 2019-09-20 MED ORDER — ACETAMINOPHEN 325 MG PO TABS
650.0000 mg | ORAL_TABLET | Freq: Once | ORAL | Status: AC
  Administered 2019-09-20: 650 mg via ORAL

## 2019-09-20 MED ORDER — DIPHENHYDRAMINE HCL 25 MG PO CAPS
ORAL_CAPSULE | ORAL | Status: AC
  Filled 2019-09-20: qty 2

## 2019-09-20 MED ORDER — DIPHENHYDRAMINE HCL 25 MG PO CAPS
50.0000 mg | ORAL_CAPSULE | Freq: Once | ORAL | Status: AC
  Administered 2019-09-20: 50 mg via ORAL

## 2019-09-20 MED ORDER — SODIUM CHLORIDE 0.9 % IV SOLN
200.0000 mg | Freq: Once | INTRAVENOUS | Status: AC
  Administered 2019-09-20: 200 mg via INTRAVENOUS
  Filled 2019-09-20: qty 200

## 2019-09-20 NOTE — Patient Instructions (Signed)

## 2019-09-23 ENCOUNTER — Telehealth: Payer: Self-pay | Admitting: Gastroenterology

## 2019-09-23 DIAGNOSIS — Z86010 Personal history of colonic polyps: Secondary | ICD-10-CM

## 2019-09-23 MED ORDER — SUPREP BOWEL PREP KIT 17.5-3.13-1.6 GM/177ML PO SOLN: 1.0000 | Freq: Once | ORAL | 0 refills | Status: AC

## 2019-09-23 NOTE — Telephone Encounter (Signed)
suprep sent to pharmacy - pt aware

## 2019-09-24 ENCOUNTER — Ambulatory Visit (INDEPENDENT_AMBULATORY_CARE_PROVIDER_SITE_OTHER): Payer: PRIVATE HEALTH INSURANCE

## 2019-09-24 ENCOUNTER — Other Ambulatory Visit: Payer: Self-pay | Admitting: Gastroenterology

## 2019-09-24 DIAGNOSIS — Z1159 Encounter for screening for other viral diseases: Secondary | ICD-10-CM

## 2019-09-25 LAB — SARS CORONAVIRUS 2 (TAT 6-24 HRS): SARS Coronavirus 2: NEGATIVE

## 2019-09-27 ENCOUNTER — Inpatient Hospital Stay: Payer: PRIVATE HEALTH INSURANCE

## 2019-09-27 ENCOUNTER — Other Ambulatory Visit: Payer: Self-pay

## 2019-09-27 VITALS — BP 141/78 | HR 67 | Temp 98.3°F | Resp 16

## 2019-09-27 DIAGNOSIS — D509 Iron deficiency anemia, unspecified: Secondary | ICD-10-CM | POA: Diagnosis not present

## 2019-09-27 MED ORDER — DIPHENHYDRAMINE HCL 25 MG PO CAPS
ORAL_CAPSULE | ORAL | Status: AC
  Filled 2019-09-27: qty 2

## 2019-09-27 MED ORDER — DIPHENHYDRAMINE HCL 25 MG PO CAPS
50.0000 mg | ORAL_CAPSULE | Freq: Once | ORAL | Status: AC
  Administered 2019-09-27: 50 mg via ORAL

## 2019-09-27 MED ORDER — SODIUM CHLORIDE 0.9 % IV SOLN
200.0000 mg | Freq: Once | INTRAVENOUS | Status: AC
  Administered 2019-09-27: 200 mg via INTRAVENOUS
  Filled 2019-09-27: qty 200

## 2019-09-27 MED ORDER — ACETAMINOPHEN 325 MG PO TABS
ORAL_TABLET | ORAL | Status: AC
  Filled 2019-09-27: qty 2

## 2019-09-27 MED ORDER — ACETAMINOPHEN 325 MG PO TABS
650.0000 mg | ORAL_TABLET | Freq: Once | ORAL | Status: AC
  Administered 2019-09-27: 650 mg via ORAL

## 2019-09-27 MED ORDER — SODIUM CHLORIDE 0.9 % IV SOLN
Freq: Once | INTRAVENOUS | Status: AC
  Filled 2019-09-27: qty 250

## 2019-09-27 NOTE — Patient Instructions (Signed)
Iron Sucrose injection What is this medicine? IRON SUCROSE (AHY ern SOO krohs) is an iron complex. Iron is used to make healthy red blood cells, which carry oxygen and nutrients throughout the body. This medicine is used to treat iron deficiency anemia in people with chronic kidney disease. This medicine may be used for other purposes; ask your health care provider or pharmacist if you have questions. COMMON BRAND NAME(S): Venofer What should I tell my health care provider before I take this medicine? They need to know if you have any of these conditions:  anemia not caused by low iron levels  heart disease  high levels of iron in the blood  kidney disease  liver disease  an unusual or allergic reaction to iron, other medicines, foods, dyes, or preservatives  pregnant or trying to get pregnant  breast-feeding How should I use this medicine? This medicine is for infusion into a vein. It is given by a health care professional in a hospital or clinic setting. Talk to your pediatrician regarding the use of this medicine in children. While this drug may be prescribed for children as young as 2 years for selected conditions, precautions do apply. Overdosage: If you think you have taken too much of this medicine contact a poison control center or emergency room at once. NOTE: This medicine is only for you. Do not share this medicine with others. What if I miss a dose? It is important not to miss your dose. Call your doctor or health care professional if you are unable to keep an appointment. What may interact with this medicine? Do not take this medicine with any of the following medications:  deferoxamine  dimercaprol  other iron products This medicine may also interact with the following medications:  chloramphenicol  deferasirox This list may not describe all possible interactions. Give your health care provider a list of all the medicines, herbs, non-prescription drugs, or  dietary supplements you use. Also tell them if you smoke, drink alcohol, or use illegal drugs. Some items may interact with your medicine. What should I watch for while using this medicine? Visit your doctor or healthcare professional regularly. Tell your doctor or healthcare professional if your symptoms do not start to get better or if they get worse. You may need blood work done while you are taking this medicine. You may need to follow a special diet. Talk to your doctor. Foods that contain iron include: whole grains/cereals, dried fruits, beans, or peas, leafy green vegetables, and organ meats (liver, kidney). What side effects may I notice from receiving this medicine? Side effects that you should report to your doctor or health care professional as soon as possible:  allergic reactions like skin rash, itching or hives, swelling of the face, lips, or tongue  breathing problems  changes in blood pressure  cough  fast, irregular heartbeat  feeling faint or lightheaded, falls  fever or chills  flushing, sweating, or hot feelings  joint or muscle aches/pains  seizures  swelling of the ankles or feet  unusually weak or tired Side effects that usually do not require medical attention (report to your doctor or health care professional if they continue or are bothersome):  diarrhea  feeling achy  headache  irritation at site where injected  nausea, vomiting  stomach upset  tiredness This list may not describe all possible side effects. Call your doctor for medical advice about side effects. You may report side effects to FDA at 1-800-FDA-1088. Where should I keep   my medicine? This drug is given in a hospital or clinic and will not be stored at home. NOTE: This sheet is a summary. It may not cover all possible information. If you have questions about this medicine, talk to your doctor, pharmacist, or health care provider.  2020 Elsevier/Gold Standard (2011-04-11  17:14:35) Coronavirus (COVID-19) Are you at risk?  Are you at risk for the Coronavirus (COVID-19)?  To be considered HIGH RISK for Coronavirus (COVID-19), you have to meet the following criteria:  . Traveled to [place], [place], [place], [place] or [place]; or in the [place] to [place], [place], [place], or [place]; and have fever, cough, and shortness of breath within the last 2 weeks of travel OR . Been in close contact with a person diagnosed with COVID-19 within the last 2 weeks and have fever, cough, and shortness of breath . IF YOU DO NOT MEET THESE CRITERIA, YOU ARE CONSIDERED LOW RISK FOR COVID-19.  What to do if you are HIGH RISK for COVID-19?  . If you are having a medical emergency, call 911. . Seek medical care right away. Before you go to a doctor's office, urgent care or emergency department, call ahead and tell them about your recent travel, contact with someone diagnosed with COVID-19, and your symptoms. You should receive instructions from your physician's office regarding next steps of care.  . When you arrive at healthcare provider, tell the healthcare staff immediately you have returned from visiting [place], [place], [place], [place] or [place]; or traveled in the [place] to [place], [place], [place], or [place]; in the last two weeks or you have been in close contact with a person diagnosed with COVID-19 in the last 2 weeks.   . Tell the health care staff about your symptoms: fever, cough and shortness of breath. . After you have been seen by a medical provider, you will be either: o Tested for (COVID-19) and discharged home on quarantine except to seek medical care if symptoms worsen, and asked to  - Stay home and avoid contact with others until you get your results (4-5 days)  - Avoid travel on public transportation if possible (such as bus, train, or airplane) or o Sent to the Emergency Department by EMS for evaluation, COVID-19 testing, and  possible admission depending on your condition and test results.  What to do if you are LOW RISK for COVID-19?  Reduce your risk of any infection by using the same precautions used for avoiding the common cold or flu:  . Wash your hands often with soap and warm water for at least 20 seconds.  If soap and water are not readily available, use an alcohol-based hand sanitizer with at least 60% alcohol.  . If coughing or sneezing, cover your mouth and nose by coughing or sneezing into the elbow areas of your shirt or coat, into a tissue or into your sleeve (not your hands). . Avoid shaking hands with others and consider head nods or verbal greetings only. . Avoid touching your eyes, nose, or mouth with unwashed hands.  . Avoid close contact with people who are sick. . Avoid places or events with large numbers of people in one location, like concerts or sporting events. . Carefully consider travel plans you have or are making. . If you are planning any travel outside or inside the [place], visit the CDC's Travelers' Health webpage for the latest health notices. . If you have some symptoms but not all   symptoms, continue to monitor at home and seek medical attention if your symptoms worsen. . If you are having a medical emergency, call 911.   ADDITIONAL HEALTHCARE OPTIONS FOR PATIENTS  [facility] / e-Visit: [email or web address]         [facility]: [phone]  [facility]: [phone]                   [facility]: [phone]  

## 2019-09-29 ENCOUNTER — Encounter: Payer: Self-pay | Admitting: Gastroenterology

## 2019-09-29 ENCOUNTER — Ambulatory Visit (AMBULATORY_SURGERY_CENTER): Payer: PRIVATE HEALTH INSURANCE | Admitting: Gastroenterology

## 2019-09-29 ENCOUNTER — Other Ambulatory Visit: Payer: Self-pay

## 2019-09-29 VITALS — BP 141/75 | HR 77 | Temp 96.8°F | Resp 16 | Ht 62.0 in | Wt 192.0 lb

## 2019-09-29 DIAGNOSIS — C180 Malignant neoplasm of cecum: Secondary | ICD-10-CM | POA: Diagnosis not present

## 2019-09-29 DIAGNOSIS — K635 Polyp of colon: Secondary | ICD-10-CM

## 2019-09-29 DIAGNOSIS — D125 Benign neoplasm of sigmoid colon: Secondary | ICD-10-CM

## 2019-09-29 DIAGNOSIS — K648 Other hemorrhoids: Secondary | ICD-10-CM

## 2019-09-29 DIAGNOSIS — K5730 Diverticulosis of large intestine without perforation or abscess without bleeding: Secondary | ICD-10-CM

## 2019-09-29 DIAGNOSIS — D509 Iron deficiency anemia, unspecified: Secondary | ICD-10-CM

## 2019-09-29 DIAGNOSIS — D123 Benign neoplasm of transverse colon: Secondary | ICD-10-CM

## 2019-09-29 DIAGNOSIS — Z86010 Personal history of colonic polyps: Secondary | ICD-10-CM

## 2019-09-29 MED ORDER — SODIUM CHLORIDE 0.9 % IV SOLN: 500.0000 mL | Freq: Once | INTRAVENOUS | Status: DC

## 2019-09-29 NOTE — Progress Notes (Signed)
Called to room to assist during endoscopic procedure.  Patient ID and intended procedure confirmed with present staff. Received instructions for my participation in the procedure from the performing physician.  

## 2019-09-29 NOTE — Progress Notes (Signed)
To [facility] VSS Report to [email or web address] 

## 2019-09-29 NOTE — Op Note (Signed)
[facility] Patient Name: Rachel Frank Procedure Date: 09/29/2019 1:46 PM MRN: [id number] Endoscopist: Milus Banister , MD Age: 59 Referring MD:  Date of Birth: April 25, 1961 Gender: Female Account #: [id number] Procedure:                Colonoscopy Indications:              High risk colon cancer surveillance: Personal                            history of colonic polyps; 2cm adenoma with foci of                            HGD removed 2016, recommended repeat colonoscopy in                            3 months; now with IDA Medicines:                Monitored Anesthesia Care Procedure:                Pre-Anesthesia Assessment:                           - Prior to the procedure, a History and Physical                            was performed, and patient medications and                            allergies were reviewed. The patient's tolerance of                            previous anesthesia was also reviewed. The risks                            and benefits of the procedure and the sedation                            options and risks were discussed with the patient.                            All questions were answered, and informed consent                            was obtained. Prior Anticoagulants: The patient has                            taken no previous anticoagulant or antiplatelet                            agents. ASA Grade Assessment: II - A patient with                            mild systemic disease. After reviewing the risks  and benefits, the patient was deemed in                            satisfactory condition to undergo the procedure.                           After obtaining informed consent, the colonoscope                            was passed under direct vision. Throughout the                            procedure, the patient's blood pressure, pulse, and                            oxygen saturations were monitored  continuously. The                            Colonoscope was introduced through the anus and                            advanced to the the cecum, identified by its                            appearance. The colonoscopy was performed without                            difficulty. The patient tolerated the procedure                            well. The quality of the bowel preparation was                            good. The rectum was photographed. Scope In: 1:57:08 PM Scope Out: 2:08:52 PM Scope Withdrawal Time: 0 hours 9 minutes 9 seconds  Total Procedure Duration: 0 hours 11 minutes 44 seconds  Findings:                 A fungating, ulcerated large mass was found in the                            cecum, completely obliterating all cecal landmarks.                            The mass measured four cm in length. This was                            biopsied extensively with a cold forceps for                            histology. There was a 39mm satellite polyp just                            distal to the mass that  I did not resect.                           Three sessile polyps were found in the sigmoid                            colon, transverse colon and hepatic flexure. The                            polyps were 2 to 4 mm in size. These polyps were                            removed with a cold snare. Resection and retrieval                            were complete.                           Multiple small and large-mouthed diverticula were                            found in the left colon.                           Internal hemorrhoids were found. The hemorrhoids                            were small.                           The exam was otherwise without abnormality on                            direct and retroflexion views. Complications:            No immediate complications. Estimated blood loss:                            None. Estimated Blood Loss:     Estimated blood  loss: none. 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 Biopsied.                            3cm satellite polyp just distal to the mass that I                            did not remove.                           - Three 2 to 4 mm polyps in the sigmoid colon, in                            the transverse colon and at the hepatic flexure,  removed with a cold snare. Resected and retrieved.                           - Diverticulosis in the left colon.                           - Internal hemorrhoids.                           - The examination was otherwise normal on direct                            and retroflexion views. Recommendation:           - Patient has a contact number available for                            emergencies. The signs and symptoms of potential                            delayed complications were discussed with the                            patient. Return to normal activities tomorrow.                            Written discharge instructions were provided to the                            patient.                           - Resume previous diet.                           - Continue present medications.                           - Await pathology results.                           - My office will arrange CT scan chest, abdomen,                            pelvis and CEA level for staging. Milus Banister, MD 09/29/2019 2:15:14 PM This report has been signed electronically.

## 2019-09-29 NOTE — Progress Notes (Signed)
Pt's states no medical or surgical changes since previsit or office visit. 

## 2019-09-29 NOTE — Patient Instructions (Addendum)
YOU HAD AN ENDOSCOPIC PROCEDURE TODAY AT [facility]:   Refer to the procedure report that was given to you for any specific questions about what was found during the examination.  If the procedure report does not answer your questions, please call your gastroenterologist to clarify.  If you requested that your care partner not be given the details of your procedure findings, then the procedure report has been included in a sealed envelope for you to review at your convenience later.  YOU SHOULD EXPECT: Some feelings of bloating in the abdomen. Passage of more gas than usual.  Walking can help get rid of the air that was put into your GI tract during the procedure and reduce the bloating. If you had a lower endoscopy (such as a colonoscopy or flexible sigmoidoscopy) you may notice spotting of blood in your stool or on the toilet paper. If you underwent a bowel prep for your procedure, you may not have a normal bowel movement for a few days.  Please Note:  You might notice some irritation and congestion in your nose or some drainage.  This is from the oxygen used during your procedure.  There is no need for concern and it should clear up in a day or so.  SYMPTOMS TO REPORT IMMEDIATELY:   Following lower endoscopy (colonoscopy or flexible sigmoidoscopy):  Excessive amounts of blood in the stool  Significant tenderness or worsening of abdominal pains  Swelling of the abdomen that is new, acute  Fever of 100F or higher    For urgent or emergent issues, a gastroenterologist can be reached at any hour by calling [phone]. Do not use MyChart messaging for urgent concerns.    DIET:  We do recommend a small meal at first, but then you may proceed to your regular diet.  Drink plenty of fluids but you should avoid alcoholic beverages for 24 hours.  ACTIVITY:  You should plan to take it easy for the rest of today and you should NOT DRIVE or use heavy machinery until tomorrow  (because of the sedation medicines used during the test).    FOLLOW UP: Our staff will call the number listed on your records 48-72 hours following your procedure to check on you and address any questions or concerns that you may have regarding the information given to you following your procedure. If we do not reach you, we will leave a message.  We will attempt to reach you two times.  During this call, we will ask if you have developed any symptoms of COVID 19. If you develop any symptoms (ie: fever, flu-like symptoms, shortness of breath, cough etc.) before then, please call [phone].  If you test positive for Covid 19 in the 2 weeks post procedure, please call and report this information to [place].    If any biopsies were taken you will be contacted by phone or by letter within the next 1-3 weeks.  Please call us at [phone] if you have not heard about the biopsies in 3 weeks.    SIGNATURES/CONFIDENTIALITY: You and/or your care partner have signed paperwork which will be entered into your electronic medical record.  These signatures attest to the fact that that the information above on your After Visit Summary has been reviewed and is understood.  Full responsibility of the confidentiality of this discharge information lies with you and/or your care-partner.   Resume medications. Office will arrange for CT SCAN AND CEA LEVEL AND CONTACT YOU. SENDING  CONTRAST WITH and instruction sheet with patient. Information given on polyps, hemorrhoids and diverticulosis.

## 2019-09-30 ENCOUNTER — Telehealth: Payer: Self-pay

## 2019-09-30 DIAGNOSIS — C180 Malignant neoplasm of cecum: Secondary | ICD-10-CM

## 2019-09-30 NOTE — Telephone Encounter (Signed)
Dr Ardis Hughs the soonest appt with any facility is 10/08/19 at 1 pm.  She has been instructed.  Is this ok?    You have been scheduled for a CT scan of the abdomen and pelvis at [facility] ([address]---this is in the same building as [employer]).   You are scheduled on 10/08/19 at 1 pm. You should arrive 15 minutes prior to your appointment time for registration. Please follow the written instructions below on the day of your exam:  WARNING: IF YOU ARE ALLERGIC TO IODINE/X-RAY DYE, PLEASE NOTIFY RADIOLOGY IMMEDIATELY AT [phone]! YOU WILL BE GIVEN A 13 HOUR PREMEDICATION PREP.  1) Do not eat or drink anything after 9 am (4 hours prior to your test) 2) You have been given 2 bottles of oral contrast to drink. The solution may taste better if refrigerated, but do NOT add ice or any other liquid to this solution. Shake well before drinking.    Drink 1 bottle of contrast @ 11 am (2 hours prior to your exam)  Drink 1 bottle of contrast @ 12 noon (1 hour prior to your exam)  You may take any medications as prescribed with a small amount of water, if necessary. If you take any of the following medications: METFORMIN, GLUCOPHAGE, GLUCOVANCE, AVANDAMET, RIOMET, FORTAMET, [place] MET, JANUMET, GLUMETZA or METAGLIP, you MAY be asked to HOLD this medication 48 hours AFTER the exam.  The purpose of you drinking the oral contrast is to aid in the visualization of your intestinal tract. The contrast solution may cause some diarrhea. Depending on your individual set of symptoms, you may also receive an intravenous injection of x-ray contrast/dye. Plan on being at [facility] for 30 minutes or longer, depending on the type of exam you are having performed.  This test typically takes 30-45 minutes to complete.  If you have any questions regarding your exam or if you need to reschedule, you may call the CT department at [phone] between the hours of 8:00 am and 5:00 pm,  Monday-Friday.   Your physician has requested that you come in for lab work at least 2 days prior to the CT scan.   You do not need an appointment.  Our basement lab is open 7:30 am-5 pm.  You do not need to be fasting. The address is [address]  [place] [address].

## 2019-10-01 ENCOUNTER — Telehealth: Payer: Self-pay

## 2019-10-01 NOTE — Telephone Encounter (Signed)
  Follow up Call-  Call back number 09/29/2019  Post procedure Call Back phone  # [phone]  Permission to leave phone message Yes  Some recent data might be hidden     Patient questions:  Do you have a fever, pain , or abdominal swelling? No. Pain Score  0 *  Have you tolerated food without any problems? Yes.    Have you been able to return to your normal activities? Yes.    Do you have any questions about your discharge instructions: Diet   No. Medications  No. Follow up visit  No.  Do you have questions or concerns about your Care? No.  Actions: * If pain score is 4 or above: No action needed, pain <4.   1. Have you developed a fever since your procedure? No  2.   Have you had an respiratory symptoms (SOB or cough) since your procedure? No  3.   Have you tested positive for COVID 19 since your procedure No  4.   Have you had any family members/close contacts diagnosed with the COVID 19 since your procedure?  No   If yes to any of these questions please route to Joylene John, RN and Alphonsa Gin, RN.

## 2019-10-06 ENCOUNTER — Other Ambulatory Visit (INDEPENDENT_AMBULATORY_CARE_PROVIDER_SITE_OTHER): Payer: PRIVATE HEALTH INSURANCE

## 2019-10-06 DIAGNOSIS — C180 Malignant neoplasm of cecum: Secondary | ICD-10-CM

## 2019-10-06 LAB — BUN: BUN: 11 mg/dL (ref 6–23)

## 2019-10-06 LAB — CREATININE, SERUM: Creatinine, Ser: 0.77 mg/dL (ref 0.40–1.20)

## 2019-10-07 LAB — CEA: CEA: 8068.8 ng/mL — ABNORMAL HIGH

## 2019-10-08 ENCOUNTER — Other Ambulatory Visit: Payer: Self-pay

## 2019-10-08 ENCOUNTER — Telehealth: Payer: Self-pay | Admitting: Gastroenterology

## 2019-10-08 ENCOUNTER — Ambulatory Visit (INDEPENDENT_AMBULATORY_CARE_PROVIDER_SITE_OTHER)
Admission: RE | Admit: 2019-10-08 | Discharge: 2019-10-08 | Disposition: A | Discharge status: Home / Self Care | Payer: PRIVATE HEALTH INSURANCE | Source: Ambulatory Visit | Attending: Gastroenterology | Admitting: Gastroenterology

## 2019-10-08 DIAGNOSIS — C180 Malignant neoplasm of cecum: Secondary | ICD-10-CM | POA: Diagnosis not present

## 2019-10-08 MED ORDER — IOHEXOL 300 MG/ML  SOLN
100.0000 mL | Freq: Once | INTRAMUSCULAR | Status: AC | PRN
  Administered 2019-10-08: 100 mL via INTRAVENOUS

## 2019-10-08 NOTE — Telephone Encounter (Signed)
Results are in Dr. Ardis Hughs box.

## 2019-10-10 NOTE — Progress Notes (Signed)
[facility] OFFICE PROGRESS NOTE  Rachel Morale, MD [place] [address]  DIAGNOSIS:  1) Stage IV adenocarcinoma of the cecum. She presented with a large cecal mass, extensive hepatic metastatic disease; mild thoracic and abdominal adenopathy. She also has several small nonspecific pulmonary nodules. She was diagnosed in March 2021.  2) Iron deficiency anemia secondary to #1.   PRIOR THERAPY: None  CURRENT THERAPY: 1) Oral iron supplements and IV iron with venofer as needed.   INTERVAL HISTORY: Rachel Frank 59 y.o. female returns to the clinic for a follow up visit accompanied by her daughter. The patient is feeling fatigued today. She was initially referred to the clinic for consideration of IV iron infusions due to significant iron deficiency anemia. After further discussion/chart review at her initial visit, it was noted that the patient had a suspicious colonoscopy in 2016 which noted a 2.6cm soft, villous appearing polyp located in the cecum. The final pathology revealed a tubular adenoma with foci of high-grade dysplasia.  It was recommended at that time that the patient have a follow-up colonoscopy in 3 months. The patient was lost to follow-up at that time and did not complete her repeat colonoscopy. Her labs from her previous visit also noted abnormal LFTs suspicious for CRC with liver metastases. At this visit, she noted fatigue, dypnea on exertion, melena, weight loss, and night sweats. She had a repeat colonoscopy by Dr. Ardis Hughs on 09/29/2019 which noted a malignant appearing mass in the cecum. The pathology was consistent with adenocarcinoma. Dr. Ardis Hughs ordered a staging CT scan which demonstrated a large cecal mass, extensive hepatic metastatic disease; mild thoracic and abdominal adenopathy. CEA 8,068.8.  Today, she continues to report fatigue, abdominal discomfort, night sweats, nausea and vomiting, diminished appetite, and weight loss.  She  received 4 weekly Venofer infusions without significant improvement in her fatigue.  Per chart review, it appears that the patient lost 14 pounds in the last 2 weeks.  She reports frequent nausea and vomiting approximately 2 times per week.  She reports night sweats.  She denies any recent fever or chills.  She denies any palpable lymphadenopathy.  She is reporting an abdominal discomfort in the right and left upper abdomen.  This pain radiates to her back.  Her pain is relieved with changing positions.  She has not tried taking any over-the-counter pain medications due to not knowing what she would be allowed to take.  She denies any melena or hematochezia. She denies any diarrhea or constipation. She states her stools are brown. She denies any chest pain or hemoptysis.  She notes some dyspnea on exertion.  Denies calf pain, swelling, or tenderness.  She was scheduled to see a surgeon tomorrow but states that the surgeon is out of network for her and is requesting to see a different physician at [facility] that is in network.  She is here today for evaluation and to review the results of her CT scan.   MEDICAL HISTORY: Past Medical History:  Diagnosis Date  . Anemia    iron deficiency  . Gynecological examination    [facility]  . Heart murmur   . Hyperlipidemia    on simvastatin  . Hypertension     ALLERGIES:  has No Known Allergies.  MEDICATIONS:  Current Outpatient Medications  Medication Sig Dispense Refill  . amLODipine (NORVASC) 5 MG tablet Take 1 tablet (5 mg total) by mouth daily. 90 tablet 3  .  Ascorbic Acid (VITAMIN C WITH ROSE HIPS) 500 MG tablet Take 500 mg by mouth daily.    Marland Kitchen atenolol (TENORMIN) 50 MG tablet TAKE ONE TABLET BY MOUTH EACH DAY 90 tablet 3  . calcium carbonate (OSCAL) 1500 (600 Ca) MG TABS tablet Take 600 mg of elemental calcium by mouth daily with breakfast. Plus D-3    . hydrochlorothiazide (HYDRODIURIL) 25 MG tablet Take 1 tablet  (25 mg total) by mouth daily. 90 tablet 3  . HYDROcodone-homatropine (HYDROMET) 5-1.5 MG/5ML syrup Take 5 mLs by mouth every 4 (four) hours as needed. 240 mL 0  . potassium chloride (KLOR-CON) 10 MEQ tablet Take 1 tablet by mouth  daily 90 tablet 3  . simvastatin (ZOCOR) 40 MG tablet Take 1 tablet (40 mg total) by mouth at bedtime. 90 tablet 3  . acetaminophen (TYLENOL) 325 MG tablet Take 650 mg by mouth every 6 (six) hours as needed.    . lidocaine-prilocaine (EMLA) cream Apply 1 application topically as needed. 30 g 2  . Multiple Vitamin (MULTIVITAMIN) tablet Take 1 tablet by mouth daily.      Marland Kitchen oxyCODONE (ROXICODONE) 5 MG immediate release tablet Take 1 tablet (5 mg total) by mouth every 6 (six) hours as needed for severe pain. 30 tablet 0  . prochlorperazine (COMPAZINE) 10 MG tablet Take 1 tablet (10 mg total) by mouth every 6 (six) hours as needed for nausea or vomiting. 30 tablet 2   Current Facility-Administered Medications  Medication Dose Route Frequency Provider Last Rate Last Admin  . 0.9 %  sodium chloride infusion  500 mL Intravenous Once Milus Banister, MD        SURGICAL HISTORY:  Past Surgical History:  Procedure Laterality Date  . c section    . COLONOSCOPY  09-16-14   per Dr. Ardis Hughs, adenomatous polyp, repeat in 3 months   . HEMORROIDECTOMY    . POLYPECTOMY      REVIEW OF SYSTEMS:   Review of Systems  Constitutional: Positive for fatigue, decreased appetite, and weight loss.  Negative for chills and fever.  HENT: Negative for mouth sores, nosebleeds, sore throat and trouble swallowing.   Eyes: Negative for eye problems and icterus.  Respiratory: Positive for dyspnea on exertion. Negative for cough, hemoptysis,  and wheezing.   Cardiovascular: Negative for chest pain and leg swelling.  Gastrointestinal: Positive for right upper quadrant and left upper quadrant abdominal discomfort and fullness.  Positive for nausea and occassional vomiting. Negative for constipation  or diarrhea.  Genitourinary: Negative for bladder incontinence, difficulty urinating, dysuria, frequency and hematuria.   Musculoskeletal: Positive for back pain. Negative for gait problem, neck pain and neck stiffness.  Skin: Negative for itching and rash.  Neurological: Negative for dizziness, extremity weakness, gait problem, headaches, light-headedness and seizures.  Hematological: Negative for adenopathy. Does not bruise/bleed easily.  Psychiatric/Behavioral: Negative for confusion, depression and sleep disturbance. The patient is not nervous/anxious.     PHYSICAL EXAMINATION:  Blood pressure 112/79, pulse 85, temperature 98.2 F (36.8 C), temperature source Temporal, resp. rate 20, height '5\' 2"'$  (1.575 m), weight 178 lb 9.6 oz (81 kg), SpO2 100 %.  ECOG PERFORMANCE STATUS: 1 - Symptomatic but completely ambulatory  Physical Exam  Constitutional: Oriented to person, place, and time and well-developed, well-nourished, and in no distress but appears uncomfortable.  HENT:  Head: Normocephalic and atraumatic.  Mouth/Throat: Oropharynx is clear and moist. No oropharyngeal exudate.  Eyes: Conjunctivae are normal. Right eye exhibits no discharge. Left eye exhibits no  discharge. No scleral icterus.  Neck: Normal range of motion. Neck supple.  Cardiovascular: Normal rate, regular rhythm, normal heart sounds and intact distal pulses.   Pulmonary/Chest: Effort normal and breath sounds normal. No respiratory distress. No wheezes. No rales.  Abdominal: Hepatomegaly. Mild discomfort with palpation over right and left upper quadrant. Bowel sounds are normal. No rebound tenderness. Negative Rovsing sign and Mcburney tenderness.  Musculoskeletal: Normal range of motion. Exhibits no edema.  Lymphadenopathy:    No cervical adenopathy.  Neurological: Alert and oriented to person, place, and time. Exhibits normal muscle tone. Gait normal. Coordination normal.  Skin: Skin is warm and dry. No rash noted.  Not diaphoretic. No erythema. No pallor.  Psychiatric: Mood, memory and judgment normal.  Vitals reviewed.  LABORATORY DATA: Lab Results  Component Value Date   WBC 9.4 10/11/2019   HGB 11.3 (L) 10/11/2019   HCT 36.5 10/11/2019   MCV 76.2 (L) 10/11/2019   PLT 574 (H) 10/11/2019      Chemistry      Component Value Date/Time   NA 132 (L) 10/11/2019 1432   K 4.0 10/11/2019 1432   CL 94 (L) 10/11/2019 1432   CO2 23 10/11/2019 1432   BUN 11 10/11/2019 1432   CREATININE 0.71 10/11/2019 1432      Component Value Date/Time   CALCIUM 9.0 10/11/2019 1432   ALKPHOS 940 (H) 10/11/2019 1432   AST 117 (H) 10/11/2019 1432   ALT 37 10/11/2019 1432   BILITOT 1.7 (H) 10/11/2019 1432       RADIOGRAPHIC STUDIES:  CT CHEST W CONTRAST  Result Date: 10/08/2019 CLINICAL DATA:  Newly diagnosed cecal adenocarcinoma. Abdominal bloating, fatigue, and abdominal pain along with shortness of breath over the last year. Staging workup. EXAM: CT CHEST, ABDOMEN, AND PELVIS WITH CONTRAST TECHNIQUE: Multidetector CT imaging of the chest, abdomen and pelvis was performed following the standard protocol during bolus administration of intravenous contrast. CONTRAST:  13m OMNIPAQUE IOHEXOL 300 MG/ML  SOLN COMPARISON:  Chest radiograph 08/20/2019 FINDINGS: CT CHEST FINDINGS Cardiovascular: Unremarkable Mediastinum/Nodes: 1.1 by 0.6 cm right thyroid lobe nodule on image 5/2. No followup recommended (ref: J Am Coll Radiol. 2015 Feb;12(2): 143-50). Prevascular node 1.1 cm in short axis on image 16/2. Right lower paratracheal node 0.7 cm in short axis on image 21/2. Right hilar lymph node 0.7 cm in short axis, image 24/2. Left hilar lymph node 1.0 cm in short axis, image 25/2. Lungs/Pleura: A right middle lobe nodule measures 0.7 by 0.4 by 0.5 cm on image 72/3. 2 mm left upper lobe nodule on image 56/5. 2 by 3 mm left lower lobe nodule on image 64/3. Musculoskeletal: Thoracic spondylosis. CT ABDOMEN PELVIS FINDINGS  Hepatobiliary: Unfortunately there is marked widespread metastatic disease throughout all lobes of the liver. Overall metastatic burden is heavy. The lesion posteriorly in the lateral segment left hepatic lobe measures 8.1 by 6.5 cm on image 56/2. Contracted gallbladder. No significant biliary dilatation. Mild perihepatic ascites. Pancreas: Unremarkable Spleen: Unremarkable Adrenals/Urinary Tract: 7 mm right kidney upper pole nonobstructive renal calculus. 1.3 by 1.0 cm hypodense lesion of the left kidney upper pole is technically too small to characterize although probably a cyst. The urinary bladder appears unremarkable. Adrenal glands appear unremarkable. Stomach/Bowel: A cecal mass measuring about 7.3 by 4.4 by 4.8 cm is present. This is in the vicinity of the ileocecal valve. There is abnormal distension of wall thickening of the appendix with appendiceal diameter 1.3 cm on image 95/2. Right paracolic and ileocolic adenopathy with  a node measuring 1.6 cm in short axis on image 90/2. This extends towards the central mesentery where there is a 1.3 cm mesenteric node on image 85/2. There is potentially local spread of tumor from the cecal mass into the adjacent pericecal tissues. Wall thickening in the terminal ileum noted. Mild sigmoid colon diverticulosis. Vascular/Lymphatic: Aortoiliac atherosclerotic vascular disease. Pathologic retroperitoneal adenopathy noted including an aortocaval node measuring 1.4 cm in short axis on image 70/2. There is suspected adenopathy in the porta hepatis including a 1.8 cm porta hepatis node on image 65/2. Reproductive: Uterine fibroids noted. Other: There is a small amount of ascites around the cecal mass and extending into the pelvis. Malignant ascites is not excluded. Musculoskeletal: Lumbar spondylosis and degenerative disc disease. IMPRESSION: 1. Large cecal mass compatible with malignancy, with extensive hepatic metastatic disease; mild thoracic and abdominal adenopathy;  adenopathy in the ileocecal region and extending to the central mesentery; and a small amount of ascites around the cecal mass and extending into the pelvis (early malignant ascites not excluded.). 2. Dilated and thickened appearing appendix possibly filled with debris or tumor, acute appendicitis not readily excluded although the appendix does not appear to be the epicenter of the regional edema. 3. Several small pulmonary nodules are technically nonspecific and merit surveillance. 4. Other imaging findings of potential clinical significance: 7 mm right kidney upper pole nonobstructive renal calculus. Uterine fibroids. Mild sigmoid colon diverticulosis. Lumbar spondylosis and degenerative disc disease. 5. Aortic atherosclerosis. Aortic Atherosclerosis (ICD10-I70.0). Electronically Signed   By: Van Clines M.D.   On: 10/08/2019 15:12   CT ABDOMEN PELVIS W CONTRAST  Result Date: 10/08/2019 CLINICAL DATA:  Newly diagnosed cecal adenocarcinoma. Abdominal bloating, fatigue, and abdominal pain along with shortness of breath over the last year. Staging workup. EXAM: CT CHEST, ABDOMEN, AND PELVIS WITH CONTRAST TECHNIQUE: Multidetector CT imaging of the chest, abdomen and pelvis was performed following the standard protocol during bolus administration of intravenous contrast. CONTRAST:  164m OMNIPAQUE IOHEXOL 300 MG/ML  SOLN COMPARISON:  Chest radiograph 08/20/2019 FINDINGS: CT CHEST FINDINGS Cardiovascular: Unremarkable Mediastinum/Nodes: 1.1 by 0.6 cm right thyroid lobe nodule on image 5/2. No followup recommended (ref: J Am Coll Radiol. 2015 Feb;12(2): 143-50). Prevascular node 1.1 cm in short axis on image 16/2. Right lower paratracheal node 0.7 cm in short axis on image 21/2. Right hilar lymph node 0.7 cm in short axis, image 24/2. Left hilar lymph node 1.0 cm in short axis, image 25/2. Lungs/Pleura: A right middle lobe nodule measures 0.7 by 0.4 by 0.5 cm on image 72/3. 2 mm left upper lobe nodule on image  56/5. 2 by 3 mm left lower lobe nodule on image 64/3. Musculoskeletal: Thoracic spondylosis. CT ABDOMEN PELVIS FINDINGS Hepatobiliary: Unfortunately there is marked widespread metastatic disease throughout all lobes of the liver. Overall metastatic burden is heavy. The lesion posteriorly in the lateral segment left hepatic lobe measures 8.1 by 6.5 cm on image 56/2. Contracted gallbladder. No significant biliary dilatation. Mild perihepatic ascites. Pancreas: Unremarkable Spleen: Unremarkable Adrenals/Urinary Tract: 7 mm right kidney upper pole nonobstructive renal calculus. 1.3 by 1.0 cm hypodense lesion of the left kidney upper pole is technically too small to characterize although probably a cyst. The urinary bladder appears unremarkable. Adrenal glands appear unremarkable. Stomach/Bowel: A cecal mass measuring about 7.3 by 4.4 by 4.8 cm is present. This is in the vicinity of the ileocecal valve. There is abnormal distension of wall thickening of the appendix with appendiceal diameter 1.3 cm on image 95/2. Right paracolic  and ileocolic adenopathy with a node measuring 1.6 cm in short axis on image 90/2. This extends towards the central mesentery where there is a 1.3 cm mesenteric node on image 85/2. There is potentially local spread of tumor from the cecal mass into the adjacent pericecal tissues. Wall thickening in the terminal ileum noted. Mild sigmoid colon diverticulosis. Vascular/Lymphatic: Aortoiliac atherosclerotic vascular disease. Pathologic retroperitoneal adenopathy noted including an aortocaval node measuring 1.4 cm in short axis on image 70/2. There is suspected adenopathy in the porta hepatis including a 1.8 cm porta hepatis node on image 65/2. Reproductive: Uterine fibroids noted. Other: There is a small amount of ascites around the cecal mass and extending into the pelvis. Malignant ascites is not excluded. Musculoskeletal: Lumbar spondylosis and degenerative disc disease. IMPRESSION: 1. Large  cecal mass compatible with malignancy, with extensive hepatic metastatic disease; mild thoracic and abdominal adenopathy; adenopathy in the ileocecal region and extending to the central mesentery; and a small amount of ascites around the cecal mass and extending into the pelvis (early malignant ascites not excluded.). 2. Dilated and thickened appearing appendix possibly filled with debris or tumor, acute appendicitis not readily excluded although the appendix does not appear to be the epicenter of the regional edema. 3. Several small pulmonary nodules are technically nonspecific and merit surveillance. 4. Other imaging findings of potential clinical significance: 7 mm right kidney upper pole nonobstructive renal calculus. Uterine fibroids. Mild sigmoid colon diverticulosis. Lumbar spondylosis and degenerative disc disease. 5. Aortic atherosclerosis. Aortic Atherosclerosis (ICD10-I70.0). Electronically Signed   By: Van Clines M.D.   On: 10/08/2019 15:12     ASSESSMENT/PLAN:  This is a very pleasant 59 year old African-American female diagnosed with stage IV colorectal cancer, adenocarcinoma.  She presented with a large cecal mass and extensive metastases to the liver, mild thoracic and abdominal adenopathy. She has several nonspecific pulmonary nodules which will be closely monitored.  She was diagnosed in March 2021.  The patient was seen with Dr. Benay Spice today.  Dr. Benay Spice had a lengthy discussion with the patient about her current condition.  Dr. Benay Spice reviewed the patient's staging CT scan of the chest, abdomen, and pelvis.  Dr. Benay Spice discussed that her condition is not curable but is treatable.  Given that she is very symptomatic from her disease burden, Dr. Benay Spice recommends starting treatment as soon as possible, preferably in the next few days.  The concern is that with the extensive hepatic metastases, treatment may not be feasible in the near future if her LFTs elevate and preclude  [place] from administering optimal treatment. Her LFTs today show mildly elevated bilirubin at 1.7, alk phos elevated at 940, and AST 117. ALT WNL at 37.  Dr. Benay Spice may admit the patient for inpatient chemotherapy this week if unable to obtain a Port-A-Cath in the next few days.   We will try and arrange for the patient to have an urgent Port-A-Cath as well as an ultrasound-guided core biopsy of the liver performed in the next day or two. I have placed these orders. The purpose of the liver biopsy is to confirm metastatic disease as well as obtain multiple core biopsies for molecular testing (foundation 1 testing).  In the meantime, I have requested mismatch repair testing to be performed on her colon biopsy.   I have sent EMLA cream to her pharmacy in anticipation of having a port-a-cath placed. Reviewed how to apply this cream with the patient and her daughter. I sent a prescription for compazine to her pharmacy to  take every 6 hours as needed for nausea.   Due to her pain, the patient was given Percocet in the clinic today.  I sent a prescription for 5 mg of oxycodone Q6 hours to her pharmacy for cancer related pain.   Regarding her anemia, the patient's most recent Venofer infusion was given on 3/15.  We will recheck her iron studies the future.  Her hemoglobin has improved to 11.3 from 9.5 last month. \  The patient was requesting a referral to a different surgeon that is within her network.  Discussed that seeing a surgeon is likely not a priority at this point in time.   I will see the patient back for follow-up visit tomorrow morning for evaluation and more detailed discussion with the patient about the plan moving forward.   The patient was advised to call immediately if she has any concerning symptoms in the interval. The patient voices understanding of current disease status and treatment options and is in agreement with the current care plan. All questions were answered. The patient knows  to call the clinic with any problems, questions or concerns. We can certainly see the patient much sooner if necessary    Orders Placed This Encounter  Procedures  . [place] CORE BIOPSY (LIVER)    Multiple core biopsies for molecular testing and confirmation of metastatic disease per Dr. Benay Spice oncologist    Standing Status:   Future    Standing Expiration Date:   12/10/2020    Order Specific Question:   Lab orders requested (DO NOT place separate lab orders, these will be automatically ordered during procedure specimen collection):    Answer:   Surgical Pathology    Order Specific Question:   Reason for Exam (SYMPTOM  OR DIAGNOSIS REQUIRED)    Answer:   Recently diagnosed with CRC, needs multiple core biopsies for confirmation of metastatic disease as well as molecular testing    Order Specific Question:   Preferred location?    Answer:   [facility] W/Port    Standing Status:   Future    Standing Expiration Date:   12/10/2020    Order Specific Question:   Reason for exam:    Answer:   Port-a-cath for chemotherapy    Order Specific Question:   Is the patient pregnant?    Answer:   No    Order Specific Question:   Preferred Imaging Location?    Answer:   [place], [facility] 10/11/19 This was a shared visit with Vida Nicol.  Ms. Laningham was interviewed and examined.  She has been diagnosed with metastatic colon cancer after presenting with iron deficiency anemia.  The anemia has improved following IV iron therapy.  I discussed the diagnosis of metastatic colon cancer and treatment options with Ms. Grenfell and her daughter.  No therapy will be curative.  We reviewed CT images.  She has extensive metastatic disease involving the liver.  She is symptomatic with pain, nausea, anorexia, and cough.  Her performance status appears to be declining rapidly.  We prescribed oxycodone for pain.  We will try to arrange  for a liver biopsy and Port-A-Cath placement within the next 1-2 days.  If this cannot be arranged she will be admitted for expedited care.  I am concerned the window for her being able to receive systemic therapy is short.   She will return for an office visit tomorrow.  Julieanne Manson, MD

## 2019-10-11 ENCOUNTER — Inpatient Hospital Stay: Payer: PRIVATE HEALTH INSURANCE

## 2019-10-11 ENCOUNTER — Other Ambulatory Visit: Payer: Self-pay

## 2019-10-11 ENCOUNTER — Inpatient Hospital Stay (HOSPITAL_BASED_OUTPATIENT_CLINIC_OR_DEPARTMENT_OTHER): Payer: PRIVATE HEALTH INSURANCE | Admitting: Physician Assistant

## 2019-10-11 VITALS — BP 112/79 | HR 85 | Temp 98.2°F | Resp 20 | Ht 62.0 in | Wt 178.6 lb

## 2019-10-11 DIAGNOSIS — D120 Benign neoplasm of cecum: Secondary | ICD-10-CM | POA: Diagnosis not present

## 2019-10-11 DIAGNOSIS — Z7189 Other specified counseling: Secondary | ICD-10-CM | POA: Insufficient documentation

## 2019-10-11 DIAGNOSIS — G893 Neoplasm related pain (acute) (chronic): Secondary | ICD-10-CM

## 2019-10-11 DIAGNOSIS — D509 Iron deficiency anemia, unspecified: Secondary | ICD-10-CM

## 2019-10-11 DIAGNOSIS — C19 Malignant neoplasm of rectosigmoid junction: Secondary | ICD-10-CM | POA: Diagnosis not present

## 2019-10-11 DIAGNOSIS — D649 Anemia, unspecified: Secondary | ICD-10-CM

## 2019-10-11 LAB — CBC WITH DIFFERENTIAL (CANCER CENTER ONLY)
Abs Immature Granulocytes: 0.04 10*3/uL (ref 0.00–0.07)
Basophils Absolute: 0 10*3/uL (ref 0.0–0.1)
Basophils Relative: 0 %
Eosinophils Absolute: 0.1 10*3/uL (ref 0.0–0.5)
Eosinophils Relative: 1 %
HCT: 36.5 % (ref 36.0–46.0)
Hemoglobin: 11.3 g/dL — ABNORMAL LOW (ref 12.0–15.0)
Immature Granulocytes: 0 %
Lymphocytes Relative: 18 %
Lymphs Abs: 1.7 10*3/uL (ref 0.7–4.0)
MCH: 23.6 pg — ABNORMAL LOW (ref 26.0–34.0)
MCHC: 31 g/dL (ref 30.0–36.0)
MCV: 76.2 fL — ABNORMAL LOW (ref 80.0–100.0)
Monocytes Absolute: 0.7 10*3/uL (ref 0.1–1.0)
Monocytes Relative: 7 %
Neutro Abs: 6.9 10*3/uL (ref 1.7–7.7)
Neutrophils Relative %: 74 %
Platelet Count: 574 10*3/uL — ABNORMAL HIGH (ref 150–400)
RBC: 4.79 MIL/uL (ref 3.87–5.11)
RDW: 23 % — ABNORMAL HIGH (ref 11.5–15.5)
WBC Count: 9.4 10*3/uL (ref 4.0–10.5)
nRBC: 0 % (ref 0.0–0.2)

## 2019-10-11 LAB — IRON AND TIBC
Iron: 16 ug/dL — ABNORMAL LOW (ref 41–142)
Saturation Ratios: 9 % — ABNORMAL LOW (ref 21–57)
TIBC: 176 ug/dL — ABNORMAL LOW (ref 236–444)
UIBC: 161 ug/dL (ref 120–384)

## 2019-10-11 LAB — CMP (CANCER CENTER ONLY)
ALT: 37 U/L (ref 0–44)
AST: 117 U/L — ABNORMAL HIGH (ref 15–41)
Albumin: 2.2 g/dL — ABNORMAL LOW (ref 3.5–5.0)
Alkaline Phosphatase: 940 U/L — ABNORMAL HIGH (ref 38–126)
Anion gap: 15 (ref 5–15)
BUN: 11 mg/dL (ref 6–20)
CO2: 23 mmol/L (ref 22–32)
Calcium: 9 mg/dL (ref 8.9–10.3)
Chloride: 94 mmol/L — ABNORMAL LOW (ref 98–111)
Creatinine: 0.71 mg/dL (ref 0.44–1.00)
GFR, Est AFR Am: >60 mL/min (ref >60)
GFR, Estimated: >60 mL/min (ref >60)
Glucose, Bld: 113 mg/dL — ABNORMAL HIGH (ref 70–99)
Potassium: 4 mmol/L (ref 3.5–5.1)
Sodium: 132 mmol/L — ABNORMAL LOW (ref 135–145)
Total Bilirubin: 1.7 mg/dL — ABNORMAL HIGH (ref 0.3–1.2)
Total Protein: 8.4 g/dL — ABNORMAL HIGH (ref 6.5–8.1)

## 2019-10-11 LAB — FERRITIN: Ferritin: 2601 ng/mL — ABNORMAL HIGH (ref 11–307)

## 2019-10-11 MED ORDER — OXYCODONE-ACETAMINOPHEN 5-325 MG PO TABS
1.0000 | ORAL_TABLET | Freq: Once | ORAL | Status: AC
  Administered 2019-10-11: 1 via ORAL

## 2019-10-11 MED ORDER — OXYCODONE-ACETAMINOPHEN 5-325 MG PO TABS
ORAL_TABLET | ORAL | Status: AC
  Filled 2019-10-11: qty 1

## 2019-10-11 MED ORDER — OXYCODONE HCL 5 MG PO TABS: 5.0000 mg | ORAL_TABLET | ORAL | 0 refills | Status: DC | PRN

## 2019-10-11 MED ORDER — LIDOCAINE-PRILOCAINE 2.5-2.5 % EX CREA: 1.0000 "application " | TOPICAL_CREAM | CUTANEOUS | 2 refills | Status: DC | PRN

## 2019-10-11 MED ORDER — OXYCODONE HCL 5 MG PO TABS
5.0000 mg | ORAL_TABLET | Freq: Once | ORAL | Status: DC
  Filled 2019-10-11: qty 1

## 2019-10-11 MED ORDER — PROCHLORPERAZINE MALEATE 10 MG PO TABS: 10.0000 mg | ORAL_TABLET | Freq: Four times a day (QID) | ORAL | 2 refills | Status: DC | PRN

## 2019-10-11 MED ORDER — OXYCODONE HCL 5 MG PO TABS: 5.0000 mg | ORAL_TABLET | Freq: Four times a day (QID) | ORAL | 0 refills | Status: DC | PRN

## 2019-10-11 NOTE — Progress Notes (Signed)
Met patient and her daughter [name] at their follow up appointment after colonoscopy and CT scan.  I provided them with my business card with my direct phone number and encouraged them to call me with any questions or concerns.  I explained my role as GI nurse navigator and they verbalized an understanding.

## 2019-10-11 NOTE — Patient Instructions (Addendum)
-  The sample (biopsy) they took from your colon during your colonoscopy was consistent with colorectal cancer, adenocarcinoma. -I have attached your copy of your recent CT scan.  It appears that there multiple spots in the liver where the cancer may have travelled to.  -Dr. Benay Spice would like you to have a biopsy of one of the spots in the liver for 2 purposes.  The first purpose being, to make sure that this indeed is the same thing that they found in the colon.  This helps [place] with "Staging".  The purpose of staging is to tell us what the best treatment option is for you.  The second purpose of the biopsy is to get extra samples of the cancer so we could send it for special genetic tests which also gives [place] a lot of information regarding treatment. -The department that does these procedures is called interventional radiology.  They should call you sometime in the next week or so to arrange for this procedure to be done.  I have also asked for them to do a minor surgical procedure to place a device called a Port-A-Cath.  The purpose of the Port-A-Cath is that it allows [place] to administer your treatment through this device as opposed to getting an IV.  I sent a prescription for numbing cream to your pharmacy.  You put a small amount of numbing cream over the Port-A-Cath about 30 minutes before your appointment with [place].  By the time you get to your appointment, the skin is numb so you do not feel it when they are getting ready for treatment.  -I have sent a prescription for Compazine to your pharmacy.  This is a nausea medicine.  You may take 1 tablet every 6 hours as needed for nausea.

## 2019-10-12 ENCOUNTER — Encounter (HOSPITAL_COMMUNITY): Payer: Self-pay

## 2019-10-12 ENCOUNTER — Other Ambulatory Visit: Payer: Self-pay | Admitting: *Deleted

## 2019-10-12 ENCOUNTER — Other Ambulatory Visit: Payer: Self-pay | Admitting: Student

## 2019-10-12 ENCOUNTER — Inpatient Hospital Stay: Payer: PRIVATE HEALTH INSURANCE

## 2019-10-12 ENCOUNTER — Other Ambulatory Visit: Payer: Self-pay

## 2019-10-12 ENCOUNTER — Ambulatory Visit (HOSPITAL_COMMUNITY): Admission: RE | Admit: 2019-10-12 | Payer: PRIVATE HEALTH INSURANCE | Source: Ambulatory Visit

## 2019-10-12 ENCOUNTER — Other Ambulatory Visit: Payer: Self-pay | Admitting: Radiology

## 2019-10-12 ENCOUNTER — Inpatient Hospital Stay (HOSPITAL_BASED_OUTPATIENT_CLINIC_OR_DEPARTMENT_OTHER): Payer: PRIVATE HEALTH INSURANCE | Admitting: Physician Assistant

## 2019-10-12 ENCOUNTER — Other Ambulatory Visit: Payer: Self-pay | Admitting: Oncology

## 2019-10-12 VITALS — BP 117/83 | HR 93 | Temp 98.0°F | Resp 18 | Ht 62.0 in | Wt 178.5 lb

## 2019-10-12 DIAGNOSIS — R634 Abnormal weight loss: Secondary | ICD-10-CM | POA: Diagnosis not present

## 2019-10-12 DIAGNOSIS — Z006 Encounter for examination for normal comparison and control in clinical research program: Secondary | ICD-10-CM

## 2019-10-12 DIAGNOSIS — C19 Malignant neoplasm of rectosigmoid junction: Secondary | ICD-10-CM

## 2019-10-12 DIAGNOSIS — D509 Iron deficiency anemia, unspecified: Secondary | ICD-10-CM | POA: Diagnosis not present

## 2019-10-12 DIAGNOSIS — Z7189 Other specified counseling: Secondary | ICD-10-CM | POA: Diagnosis not present

## 2019-10-12 LAB — CMP (CANCER CENTER ONLY)
ALT: 37 U/L (ref 0–44)
AST: 102 U/L — ABNORMAL HIGH (ref 15–41)
Albumin: 2.2 g/dL — ABNORMAL LOW (ref 3.5–5.0)
Alkaline Phosphatase: 910 U/L — ABNORMAL HIGH (ref 38–126)
Anion gap: 11 (ref 5–15)
BUN: 11 mg/dL (ref 6–20)
CO2: 25 mmol/L (ref 22–32)
Calcium: 8.7 mg/dL — ABNORMAL LOW (ref 8.9–10.3)
Chloride: 93 mmol/L — ABNORMAL LOW (ref 98–111)
Creatinine: 0.73 mg/dL (ref 0.44–1.00)
GFR, Est AFR Am: >60 mL/min (ref >60)
GFR, Estimated: >60 mL/min (ref >60)
Glucose, Bld: 95 mg/dL (ref 70–99)
Potassium: 4 mmol/L (ref 3.5–5.1)
Sodium: 129 mmol/L — ABNORMAL LOW (ref 135–145)
Total Bilirubin: 1.2 mg/dL (ref 0.3–1.2)
Total Protein: 7.9 g/dL (ref 6.5–8.1)

## 2019-10-12 LAB — CBC WITH DIFFERENTIAL (CANCER CENTER ONLY)
Abs Immature Granulocytes: 0.04 10*3/uL (ref 0.00–0.07)
Basophils Absolute: 0 10*3/uL (ref 0.0–0.1)
Basophils Relative: 0 %
Eosinophils Absolute: 0.1 10*3/uL (ref 0.0–0.5)
Eosinophils Relative: 1 %
HCT: 36.1 % (ref 36.0–46.0)
Hemoglobin: 11.1 g/dL — ABNORMAL LOW (ref 12.0–15.0)
Immature Granulocytes: 0 %
Lymphocytes Relative: 21 %
Lymphs Abs: 2 10*3/uL (ref 0.7–4.0)
MCH: 23.4 pg — ABNORMAL LOW (ref 26.0–34.0)
MCHC: 30.7 g/dL (ref 30.0–36.0)
MCV: 76.2 fL — ABNORMAL LOW (ref 80.0–100.0)
Monocytes Absolute: 0.9 10*3/uL (ref 0.1–1.0)
Monocytes Relative: 9 %
Neutro Abs: 6.4 10*3/uL (ref 1.7–7.7)
Neutrophils Relative %: 69 %
Platelet Count: 602 10*3/uL — ABNORMAL HIGH (ref 150–400)
RBC: 4.74 MIL/uL (ref 3.87–5.11)
RDW: 23.1 % — ABNORMAL HIGH (ref 11.5–15.5)
WBC Count: 9.4 10*3/uL (ref 4.0–10.5)
nRBC: 0 % (ref 0.0–0.2)

## 2019-10-12 LAB — RESEARCH LABS

## 2019-10-12 NOTE — Research (Signed)
EXACT SCIENCES 2018-01 STUDY; BLOOD SAMPLE COLLECTION TO EVALUATE BIOMARKERS IN SUBJECTS WITH UNTREATED SOLID TUMORS.  Consent obtained earlier today (see prior note); patient returns with daughter and spouse for lab specimen collection and history interview. Four of five research blood specimens were collected via peripheral blood draw. Blood sample kit # [id number] was used for this blood collection. Patient tolerated specimen collection well without any complaints or adverse effects noted. A $50 [employer] gift card was provided to the patient after blood collection and patient signed for receipt of gift card. Following specimen collection, study questions regarding family cancer history and smoking/tobacco history were reviewd. Patient denies any cancer history in 1st degree relatives but does report her maternal grandfather died of lung cancer: he was a smoker. Patient denies any personal tobacco or alcohol use at any time. History worksheet completed per patient interview responses. Patient's ID for this study is # [id number]. Patient was again thanked for her time and participation. Dionne Bucy. Sharlett Iles, BSN, RN, CIC 10/12/2019 16:00PM

## 2019-10-12 NOTE — Patient Instructions (Signed)
Oxaliplatin Injection What is this medicine? OXALIPLATIN (ox AL i PLA tin) is a chemotherapy drug. It targets fast dividing cells, like cancer cells, and causes these cells to die. This medicine is used to treat cancers of the colon and rectum, and many other cancers. This medicine may be used for other purposes; ask your health care provider or pharmacist if you have questions. COMMON BRAND NAME(S): Eloxatin What should I tell my health care provider before I take this medicine? They need to know if you have any of these conditions:  heart disease  history of irregular heartbeat  liver disease  low blood counts, like white cells, platelets, or red blood cells  lung or breathing disease, like asthma  take medicines that treat or prevent blood clots  tingling of the fingers or toes, or other nerve disorder  an unusual or allergic reaction to oxaliplatin, other chemotherapy, other medicines, foods, dyes, or preservatives  pregnant or trying to get pregnant  breast-feeding How should I use this medicine? This drug is given as an infusion into a vein. It is administered in a hospital or clinic by a specially trained health care professional. Talk to your pediatrician regarding the use of this medicine in children. Special care may be needed. Overdosage: If you think you have taken too much of this medicine contact a poison control center or emergency room at once. NOTE: This medicine is only for you. Do not share this medicine with others. What if I miss a dose? It is important not to miss a dose. Call your doctor or health care professional if you are unable to keep an appointment. What may interact with this medicine? Do not take this medicine with any of the following medications:  cisapride  dronedarone  pimozide  thioridazine This medicine may also interact with the following medications:  aspirin and aspirin-like medicines  certain medicines that treat or prevent  blood clots like warfarin, apixaban, dabigatran, and rivaroxaban  cisplatin  cyclosporine  diuretics  medicines for infection like acyclovir, adefovir, amphotericin B, bacitracin, cidofovir, foscarnet, ganciclovir, gentamicin, pentamidine, vancomycin  NSAIDs, medicines for pain and inflammation, like ibuprofen or naproxen  other medicines that prolong the QT interval (an abnormal heart rhythm)  pamidronate  zoledronic acid This list may not describe all possible interactions. Give your health care provider a list of all the medicines, herbs, non-prescription drugs, or dietary supplements you use. Also tell them if you smoke, drink alcohol, or use illegal drugs. Some items may interact with your medicine. What should I watch for while using this medicine? Your condition will be monitored carefully while you are receiving this medicine. You may need blood work done while you are taking this medicine. This medicine may make you feel generally unwell. This is not uncommon as chemotherapy can affect healthy cells as well as cancer cells. Report any side effects. Continue your course of treatment even though you feel ill unless your healthcare professional tells you to stop. This medicine can make you more sensitive to cold. Do not drink cold drinks or use ice. Cover exposed skin before coming in contact with cold temperatures or cold objects. When out in cold weather wear warm clothing and cover your mouth and nose to warm the air that goes into your lungs. Tell your doctor if you get sensitive to the cold. Do not become pregnant while taking this medicine or for 9 months after stopping it. Women should inform their health care professional if they wish to become   pregnant or think they might be pregnant. Men should not father a child while taking this medicine and for 6 months after stopping it. There is potential for serious side effects to an unborn child. Talk to your health care professional  for more information. Do not breast-feed a child while taking this medicine or for 3 months after stopping it. This medicine has caused ovarian failure in some women. This medicine may make it more difficult to get pregnant. Talk to your health care professional if you are concerned about your fertility. This medicine has caused decreased sperm counts in some men. This may make it more difficult to father a child. Talk to your health care professional if you are concerned about your fertility. This medicine may increase your risk of getting an infection. Call your health care professional for advice if you get a fever, chills, or sore throat, or other symptoms of a cold or flu. Do not treat yourself. Try to avoid being around people who are sick. Avoid taking medicines that contain aspirin, acetaminophen, ibuprofen, naproxen, or ketoprofen unless instructed by your health care professional. These medicines may hide a fever. Be careful brushing or flossing your teeth or using a toothpick because you may get an infection or bleed more easily. If you have any dental work done, tell your dentist you are receiving this medicine. What side effects may I notice from receiving this medicine? Side effects that you should report to your doctor or health care professional as soon as possible:  allergic reactions like skin rash, itching or hives, swelling of the face, lips, or tongue  breathing problems  cough  low blood counts - this medicine may decrease the number of white blood cells, red blood cells, and platelets. You may be at increased risk for infections and bleeding  nausea, vomiting  pain, redness, or irritation at site where injected  pain, tingling, numbness in the hands or feet  signs and symptoms of bleeding such as bloody or black, tarry stools; red or dark brown urine; spitting up blood or brown material that looks like coffee grounds; red spots on the skin; unusual bruising or bleeding  from the eyes, gums, or nose  signs and symptoms of a dangerous change in heartbeat or heart rhythm like chest pain; dizziness; fast, irregular heartbeat; palpitations; feeling faint or lightheaded; falls  signs and symptoms of infection like fever; chills; cough; sore throat; pain or trouble passing urine  signs and symptoms of liver injury like dark yellow or brown urine; general ill feeling or flu-like symptoms; light-colored stools; loss of appetite; nausea; right upper belly pain; unusually weak or tired; yellowing of the eyes or skin  signs and symptoms of low red blood cells or anemia such as unusually weak or tired; feeling faint or lightheaded; falls  signs and symptoms of muscle injury like dark urine; trouble passing urine or change in the amount of urine; unusually weak or tired; muscle pain; back pain Side effects that usually do not require medical attention (report to your doctor or health care professional if they continue or are bothersome):  changes in taste  diarrhea  gas  hair loss  loss of appetite  mouth sores This list may not describe all possible side effects. Call your doctor for medical advice about side effects. You may report side effects to FDA at 1-800-FDA-1088. Where should I keep my medicine? This drug is given in a hospital or clinic and will not be stored at home. NOTE:   This sheet is a summary. It may not cover all possible information. If you have questions about this medicine, talk to your doctor, pharmacist, or health care provider.  2020 [employer]/Gold Standard (2018-11-18 12:20:35) Fluorouracil, 5-FU injection What is this medicine? FLUOROURACIL, 5-FU (flure oh YOOR a sil) is a chemotherapy drug. It slows the growth of cancer cells. This medicine is used to treat many types of cancer like breast cancer, colon or rectal cancer, pancreatic cancer, and stomach cancer. This medicine may be used for other purposes; ask your health care provider or  pharmacist if you have questions. COMMON BRAND NAME(S): Adrucil What should I tell my health care provider before I take this medicine? They need to know if you have any of these conditions:  blood disorders  dihydropyrimidine dehydrogenase (DPD) deficiency  infection (especially a virus infection such as chickenpox, cold sores, or herpes)  kidney disease  liver disease  malnourished, poor nutrition  recent or ongoing radiation therapy  an unusual or allergic reaction to fluorouracil, other chemotherapy, other medicines, foods, dyes, or preservatives  pregnant or trying to get pregnant  breast-feeding How should I use this medicine? This drug is given as an infusion or injection into a vein. It is administered in a hospital or clinic by a specially trained health care professional. Talk to your pediatrician regarding the use of this medicine in children. Special care may be needed. Overdosage: If you think you have taken too much of this medicine contact a poison control center or emergency room at once. NOTE: This medicine is only for you. Do not share this medicine with others. What if I miss a dose? It is important not to miss your dose. Call your doctor or health care professional if you are unable to keep an appointment. What may interact with this medicine?  allopurinol  cimetidine  dapsone  digoxin  hydroxyurea  leucovorin  levamisole  medicines for seizures like ethotoin, fosphenytoin, phenytoin  medicines to increase blood counts like filgrastim, pegfilgrastim, sargramostim  medicines that treat or prevent blood clots like warfarin, enoxaparin, and dalteparin  methotrexate  metronidazole  pyrimethamine  some other chemotherapy drugs like busulfan, cisplatin, estramustine, vinblastine  trimethoprim  trimetrexate  vaccines Talk to your doctor or health care professional before taking any of these  medicines:  acetaminophen  aspirin  ibuprofen  ketoprofen  naproxen This list may not describe all possible interactions. Give your health care provider a list of all the medicines, herbs, non-prescription drugs, or dietary supplements you use. Also tell them if you smoke, drink alcohol, or use illegal drugs. Some items may interact with your medicine. What should I watch for while using this medicine? Visit your doctor for checks on your progress. This drug may make you feel generally unwell. This is not uncommon, as chemotherapy can affect healthy cells as well as cancer cells. Report any side effects. Continue your course of treatment even though you feel ill unless your doctor tells you to stop. In some cases, you may be given additional medicines to help with side effects. Follow all directions for their use. Call your doctor or health care professional for advice if you get a fever, chills or sore throat, or other symptoms of a cold or flu. Do not treat yourself. This drug decreases your body's ability to fight infections. Try to avoid being around people who are sick. This medicine may increase your risk to bruise or bleed. Call your doctor or health care professional if you notice any   unusual bleeding. Be careful brushing and flossing your teeth or using a toothpick because you may get an infection or bleed more easily. If you have any dental work done, tell your dentist you are receiving this medicine. Avoid taking products that contain aspirin, acetaminophen, ibuprofen, naproxen, or ketoprofen unless instructed by your doctor. These medicines may hide a fever. Do not become pregnant while taking this medicine. Women should inform their doctor if they wish to become pregnant or think they might be pregnant. There is a potential for serious side effects to an unborn child. Talk to your health care professional or pharmacist for more information. Do not breast-feed an infant while taking  this medicine. Men should inform their doctor if they wish to father a child. This medicine may lower sperm counts. Do not treat diarrhea with over the counter products. Contact your doctor if you have diarrhea that lasts more than 2 days or if it is severe and watery. This medicine can make you more sensitive to the sun. Keep out of the sun. If you cannot avoid being in the sun, wear protective clothing and use sunscreen. Do not use sun lamps or tanning beds/booths. What side effects may I notice from receiving this medicine? Side effects that you should report to your doctor or health care professional as soon as possible:  allergic reactions like skin rash, itching or hives, swelling of the face, lips, or tongue  low blood counts - this medicine may decrease the number of white blood cells, red blood cells and platelets. You may be at increased risk for infections and bleeding.  signs of infection - fever or chills, cough, sore throat, pain or difficulty passing urine  signs of decreased platelets or bleeding - bruising, pinpoint red spots on the skin, black, tarry stools, blood in the urine  signs of decreased red blood cells - unusually weak or tired, fainting spells, lightheadedness  breathing problems  changes in vision  chest pain  mouth sores  nausea and vomiting  pain, swelling, redness at site where injected  pain, tingling, numbness in the hands or feet  redness, swelling, or sores on hands or feet  stomach pain  unusual bleeding Side effects that usually do not require medical attention (report to your doctor or health care professional if they continue or are bothersome):  changes in finger or toe nails  diarrhea  dry or itchy skin  hair loss  headache  loss of appetite  sensitivity of eyes to the light  stomach upset  unusually teary eyes This list may not describe all possible side effects. Call your doctor for medical advice about side effects.  You may report side effects to FDA at 1-800-FDA-1088. Where should I keep my medicine? This drug is given in a hospital or clinic and will not be stored at home. NOTE: This sheet is a summary. It may not cover all possible information. If you have questions about this medicine, talk to your doctor, pharmacist, or health care provider.  2020 [employer]/Gold Standard (2007-11-04 13:53:16)

## 2019-10-12 NOTE — Progress Notes (Signed)
[facility] OFFICE PROGRESS NOTE  [name], MD [place] [address]  DIAGNOSIS:  1) Stage IV adenocarcinoma of the cecum. She presented with a large cecal mass, extensive hepatic metastatic disease; mild thoracic and abdominal adenopathy. She also has several small nonspecific pulmonary nodules. She was diagnosed in March 2021.  2) Iron deficiency anemia secondary to #1.  3.  Pain secondary to #1  PRIOR THERAPY: None  CURRENT THERAPY: 1) Oral iron supplements and IV iron with venofer as needed.  2) FOLFOX IV every 2 weeks. First dose expected on 10/14/19.   INTERVAL HISTORY: Rachel Frank 59 y.o. female returns to the clinic today for a follow-up visit accompanied by her daughter. She was initially referred to the clinic for consideration of IV iron infusions due to significant iron deficiency anemia. After further discussion/chart review at her initial visit, it was noted that the patient had a suspicious colonoscopy in 2016 which noteda2.6cm soft, villous appearing polyp located in the cecum.The final pathology revealed a tubular adenoma with foci of high-grade dysplasia. It was recommended at that time that the patient have a follow-up colonoscopy in 3 months.The patient was lost to follow-up at that time and did not complete her repeat colonoscopy. Her labs from her previous visit also noted abnormal LFTs suspicious for CRC with liver metastases. At this visit, she noted fatigue, dypnea on exertion, melena, weight loss, and night sweats. She had a repeat colonoscopy by Dr. Ardis Hughs on 09/29/2019 which noted a malignant appearing mass in the cecum. The pathology was consistent with adenocarcinoma. Dr. Ardis Hughs ordered a staging CT scan which demonstrated a large cecal mass, extensive hepatic metastatic disease; mild thoracic and abdominal adenopathy. CEA 8,068.8.  The patient is here today to discuss a plan for initiating her chemotherapy.   Due to her  extensive hepatic metastases, the concern is that the window to initiate chemotherapy is small. At this point in time, her LFTs and bilirubin are only slightly elevated. The concern is that if initiating treatment is delayed, then her LFTs in the interval may elevate to a level that would preclude [place] from being able to administer treatment/optimal dosage of treatment. She is also symptomatic with weight loss, decreased appetite, night sweats, and abdominal pain/fullness. Her daughter requested a referral to nutrition for her weight loss. She has hepatomegaly.  She was given Percocet in the office yesterday which helped alleviate her symptoms of back/abdominal pain.  She was given a prescription for oxycodone 5 mg p.o. every 6 hours as needed for pain. She reports nausea and occasional vomiting. Compazine was sent to her pharmacy yesterday as well. Denies diarrhea or constipation. She denies any melena or hematochezia.  The plan today was to re-evaluate the patient and see if a port-a-cath can be placed in the next day or so to expedite starting treatment. If not, the plan was to admit the patient to the hospital for inpatient chemotherapy. Her port-a-cath was able to be scheduled for tomorrow 3/31. Therefore, we will plan to give her chemotherapy with FOLFOX on 4/1.  The liver biopsy has not been scheduled at this time.  The patient is here today for evaluation and to have a more detailed discussion about her treatment with FOLFOX.    MEDICAL HISTORY: Past Medical History:  Diagnosis Date  . Anemia    iron deficiency  . Gynecological examination    [facility] exams  . Heart murmur   .  Hyperlipidemia    on simvastatin  . Hypertension     ALLERGIES:  has No Known Allergies.  MEDICATIONS:  Current Outpatient Medications  Medication Sig Dispense Refill  . acetaminophen (TYLENOL) 325 MG tablet Take 650 mg by mouth every 6 (six) hours as needed.    Marland Kitchen amLODipine (NORVASC) 5  MG tablet Take 1 tablet (5 mg total) by mouth daily. 90 tablet 3  . Ascorbic Acid (VITAMIN C WITH ROSE HIPS) 500 MG tablet Take 500 mg by mouth daily.    Marland Kitchen atenolol (TENORMIN) 50 MG tablet TAKE ONE TABLET BY MOUTH EACH DAY 90 tablet 3  . calcium carbonate (OSCAL) 1500 (600 Ca) MG TABS tablet Take 600 mg of elemental calcium by mouth daily with breakfast. Plus D-3    . hydrochlorothiazide (HYDRODIURIL) 25 MG tablet Take 1 tablet (25 mg total) by mouth daily. 90 tablet 3  . HYDROcodone-homatropine (HYDROMET) 5-1.5 MG/5ML syrup Take 5 mLs by mouth every 4 (four) hours as needed. 240 mL 0  . lidocaine-prilocaine (EMLA) cream Apply 1 application topically as needed. 30 g 2  . Multiple Vitamin (MULTIVITAMIN) tablet Take 1 tablet by mouth daily.      Marland Kitchen oxyCODONE (ROXICODONE) 5 MG immediate release tablet Take 1 tablet (5 mg total) by mouth every 6 (six) hours as needed for severe pain. 30 tablet 0  . potassium chloride (KLOR-CON) 10 MEQ tablet Take 1 tablet by mouth  daily 90 tablet 3  . prochlorperazine (COMPAZINE) 10 MG tablet Take 1 tablet (10 mg total) by mouth every 6 (six) hours as needed for nausea or vomiting. 30 tablet 2  . simvastatin (ZOCOR) 40 MG tablet Take 1 tablet (40 mg total) by mouth at bedtime. 90 tablet 3   Current Facility-Administered Medications  Medication Dose Route Frequency Provider Last Rate Last Admin  . 0.9 %  sodium chloride infusion  500 mL Intravenous Once Milus Banister, MD        SURGICAL HISTORY:  Past Surgical History:  Procedure Laterality Date  . c section    . COLONOSCOPY  09-16-14   per Dr. Ardis Hughs, adenomatous polyp, repeat in 3 months   . HEMORROIDECTOMY    . POLYPECTOMY      REVIEW OF SYSTEMS:   Review of Systems  Constitutional: Positive for fatigue, decreased appetite, and weight loss.  Negative for chills and fever.  HENT: Negative for mouth sores, nosebleeds, sore throat and trouble swallowing.   Eyes: Negative for eye problems and icterus.   Respiratory: Positive for dyspnea on exertion. Negative for cough, hemoptysis,  and wheezing.   Cardiovascular: Negative for chest pain and leg swelling.  Gastrointestinal: Positive for right upper quadrant and left upper quadrant abdominal discomfort and fullness.  Positive for nausea and occassional vomiting. Negative for constipation or diarrhea.  Genitourinary: Negative for bladder incontinence, difficulty urinating, dysuria, frequency and hematuria.   Musculoskeletal: Positive for back pain. Negative for gait problem, neck pain and neck stiffness.  Skin: Negative for itching and rash.  Neurological: Negative for dizziness, extremity weakness, gait problem, headaches, light-headedness and seizures.  Hematological: Negative for adenopathy. Does not bruise/bleed easily.  Psychiatric/Behavioral: Negative for confusion, depression and sleep disturbance. The patient is not nervous/anxious. .     PHYSICAL EXAMINATION:  Blood pressure 117/83, pulse 93, temperature 98 F (36.7 C), temperature source Temporal, resp. rate 18, height '5\' 2"'$  (1.575 m), weight 178 lb 8 oz (81 kg), SpO2 100 %.  ECOG PERFORMANCE STATUS: 1 - Symptomatic but completely ambulatory  Physical Exam  Constitutional: Oriented to person, place, and time and well-developed, well-nourished, and in no distress but appears uncomfortable.  HENT:  Head: Normocephalic and atraumatic.  Mouth/Throat: Oropharynx is clear and moist. No oropharyngeal exudate.  Eyes: Conjunctivae are normal. Right eye exhibits no discharge. Left eye exhibits no discharge. No scleral icterus.  Neck: Normal range of motion. Neck supple.  Cardiovascular: Normal rate, regular rhythm, normal heart sounds and intact distal pulses.   Pulmonary/Chest: Effort normal and breath sounds normal. No respiratory distress. No wheezes. No rales.  Abdominal: Hepatomegaly. Mild discomfort with palpation over right and left upper quadrant. Bowel sounds are normal. No rebound  tenderness. Negative Rovsing sign and Mcburney tenderness.  Musculoskeletal: Normal range of motion. Exhibits no edema.  Lymphadenopathy:    No cervical adenopathy.  Neurological: Alert and oriented to person, place, and time. Exhibits normal muscle tone. Gait normal. Coordination normal.  Skin: Skin is warm and dry. No rash noted. Not diaphoretic. No erythema. No pallor.  Psychiatric: Mood, memory and judgment normal.  Vitals reviewed.  LABORATORY DATA: Lab Results  Component Value Date   WBC 9.4 10/11/2019   HGB 11.3 (L) 10/11/2019   HCT 36.5 10/11/2019   MCV 76.2 (L) 10/11/2019   PLT 574 (H) 10/11/2019      Chemistry      Component Value Date/Time   NA 132 (L) 10/11/2019 1432   K 4.0 10/11/2019 1432   CL 94 (L) 10/11/2019 1432   CO2 23 10/11/2019 1432   BUN 11 10/11/2019 1432   CREATININE 0.71 10/11/2019 1432      Component Value Date/Time   CALCIUM 9.0 10/11/2019 1432   ALKPHOS 940 (H) 10/11/2019 1432   AST 117 (H) 10/11/2019 1432   ALT 37 10/11/2019 1432   BILITOT 1.7 (H) 10/11/2019 1432       RADIOGRAPHIC STUDIES:  CT CHEST W CONTRAST  Result Date: 10/08/2019 CLINICAL DATA:  Newly diagnosed cecal adenocarcinoma. Abdominal bloating, fatigue, and abdominal pain along with shortness of breath over the last year. Staging workup. EXAM: CT CHEST, ABDOMEN, AND PELVIS WITH CONTRAST TECHNIQUE: Multidetector CT imaging of the chest, abdomen and pelvis was performed following the standard protocol during bolus administration of intravenous contrast. CONTRAST:  150m OMNIPAQUE IOHEXOL 300 MG/ML  SOLN COMPARISON:  Chest radiograph 08/20/2019 FINDINGS: CT CHEST FINDINGS Cardiovascular: Unremarkable Mediastinum/Nodes: 1.1 by 0.6 cm right thyroid lobe nodule on image 5/2. No followup recommended (ref: J Am Coll Radiol. 2015 Feb;12(2): 143-50). Prevascular node 1.1 cm in short axis on image 16/2. Right lower paratracheal node 0.7 cm in short axis on image 21/2. Right hilar lymph node  0.7 cm in short axis, image 24/2. Left hilar lymph node 1.0 cm in short axis, image 25/2. Lungs/Pleura: A right middle lobe nodule measures 0.7 by 0.4 by 0.5 cm on image 72/3. 2 mm left upper lobe nodule on image 56/5. 2 by 3 mm left lower lobe nodule on image 64/3. Musculoskeletal: Thoracic spondylosis. CT ABDOMEN PELVIS FINDINGS Hepatobiliary: Unfortunately there is marked widespread metastatic disease throughout all lobes of the liver. Overall metastatic burden is heavy. The lesion posteriorly in the lateral segment left hepatic lobe measures 8.1 by 6.5 cm on image 56/2. Contracted gallbladder. No significant biliary dilatation. Mild perihepatic ascites. Pancreas: Unremarkable Spleen: Unremarkable Adrenals/Urinary Tract: 7 mm right kidney upper pole nonobstructive renal calculus. 1.3 by 1.0 cm hypodense lesion of the left kidney upper pole is technically too small to characterize although probably a cyst. The urinary bladder appears unremarkable.  Adrenal glands appear unremarkable. Stomach/Bowel: A cecal mass measuring about 7.3 by 4.4 by 4.8 cm is present. This is in the vicinity of the ileocecal valve. There is abnormal distension of wall thickening of the appendix with appendiceal diameter 1.3 cm on image 95/2. Right paracolic and ileocolic adenopathy with a node measuring 1.6 cm in short axis on image 90/2. This extends towards the central mesentery where there is a 1.3 cm mesenteric node on image 85/2. There is potentially local spread of tumor from the cecal mass into the adjacent pericecal tissues. Wall thickening in the terminal ileum noted. Mild sigmoid colon diverticulosis. Vascular/Lymphatic: Aortoiliac atherosclerotic vascular disease. Pathologic retroperitoneal adenopathy noted including an aortocaval node measuring 1.4 cm in short axis on image 70/2. There is suspected adenopathy in the porta hepatis including a 1.8 cm porta hepatis node on image 65/2. Reproductive: Uterine fibroids noted. Other:  There is a small amount of ascites around the cecal mass and extending into the pelvis. Malignant ascites is not excluded. Musculoskeletal: Lumbar spondylosis and degenerative disc disease. IMPRESSION: 1. Large cecal mass compatible with malignancy, with extensive hepatic metastatic disease; mild thoracic and abdominal adenopathy; adenopathy in the ileocecal region and extending to the central mesentery; and a small amount of ascites around the cecal mass and extending into the pelvis (early malignant ascites not excluded.). 2. Dilated and thickened appearing appendix possibly filled with debris or tumor, acute appendicitis not readily excluded although the appendix does not appear to be the epicenter of the regional edema. 3. Several small pulmonary nodules are technically nonspecific and merit surveillance. 4. Other imaging findings of potential clinical significance: 7 mm right kidney upper pole nonobstructive renal calculus. Uterine fibroids. Mild sigmoid colon diverticulosis. Lumbar spondylosis and degenerative disc disease. 5. Aortic atherosclerosis. Aortic Atherosclerosis (ICD10-I70.0). Electronically Signed   By: Van Clines M.D.   On: 10/08/2019 15:12   CT ABDOMEN PELVIS W CONTRAST  Result Date: 10/08/2019 CLINICAL DATA:  Newly diagnosed cecal adenocarcinoma. Abdominal bloating, fatigue, and abdominal pain along with shortness of breath over the last year. Staging workup. EXAM: CT CHEST, ABDOMEN, AND PELVIS WITH CONTRAST TECHNIQUE: Multidetector CT imaging of the chest, abdomen and pelvis was performed following the standard protocol during bolus administration of intravenous contrast. CONTRAST:  176m OMNIPAQUE IOHEXOL 300 MG/ML  SOLN COMPARISON:  Chest radiograph 08/20/2019 FINDINGS: CT CHEST FINDINGS Cardiovascular: Unremarkable Mediastinum/Nodes: 1.1 by 0.6 cm right thyroid lobe nodule on image 5/2. No followup recommended (ref: J Am Coll Radiol. 2015 Feb;12(2): 143-50). Prevascular node 1.1  cm in short axis on image 16/2. Right lower paratracheal node 0.7 cm in short axis on image 21/2. Right hilar lymph node 0.7 cm in short axis, image 24/2. Left hilar lymph node 1.0 cm in short axis, image 25/2. Lungs/Pleura: A right middle lobe nodule measures 0.7 by 0.4 by 0.5 cm on image 72/3. 2 mm left upper lobe nodule on image 56/5. 2 by 3 mm left lower lobe nodule on image 64/3. Musculoskeletal: Thoracic spondylosis. CT ABDOMEN PELVIS FINDINGS Hepatobiliary: Unfortunately there is marked widespread metastatic disease throughout all lobes of the liver. Overall metastatic burden is heavy. The lesion posteriorly in the lateral segment left hepatic lobe measures 8.1 by 6.5 cm on image 56/2. Contracted gallbladder. No significant biliary dilatation. Mild perihepatic ascites. Pancreas: Unremarkable Spleen: Unremarkable Adrenals/Urinary Tract: 7 mm right kidney upper pole nonobstructive renal calculus. 1.3 by 1.0 cm hypodense lesion of the left kidney upper pole is technically too small to characterize although probably a cyst. The  urinary bladder appears unremarkable. Adrenal glands appear unremarkable. Stomach/Bowel: A cecal mass measuring about 7.3 by 4.4 by 4.8 cm is present. This is in the vicinity of the ileocecal valve. There is abnormal distension of wall thickening of the appendix with appendiceal diameter 1.3 cm on image 95/2. Right paracolic and ileocolic adenopathy with a node measuring 1.6 cm in short axis on image 90/2. This extends towards the central mesentery where there is a 1.3 cm mesenteric node on image 85/2. There is potentially local spread of tumor from the cecal mass into the adjacent pericecal tissues. Wall thickening in the terminal ileum noted. Mild sigmoid colon diverticulosis. Vascular/Lymphatic: Aortoiliac atherosclerotic vascular disease. Pathologic retroperitoneal adenopathy noted including an aortocaval node measuring 1.4 cm in short axis on image 70/2. There is suspected adenopathy  in the porta hepatis including a 1.8 cm porta hepatis node on image 65/2. Reproductive: Uterine fibroids noted. Other: There is a small amount of ascites around the cecal mass and extending into the pelvis. Malignant ascites is not excluded. Musculoskeletal: Lumbar spondylosis and degenerative disc disease. IMPRESSION: 1. Large cecal mass compatible with malignancy, with extensive hepatic metastatic disease; mild thoracic and abdominal adenopathy; adenopathy in the ileocecal region and extending to the central mesentery; and a small amount of ascites around the cecal mass and extending into the pelvis (early malignant ascites not excluded.). 2. Dilated and thickened appearing appendix possibly filled with debris or tumor, acute appendicitis not readily excluded although the appendix does not appear to be the epicenter of the regional edema. 3. Several small pulmonary nodules are technically nonspecific and merit surveillance. 4. Other imaging findings of potential clinical significance: 7 mm right kidney upper pole nonobstructive renal calculus. Uterine fibroids. Mild sigmoid colon diverticulosis. Lumbar spondylosis and degenerative disc disease. 5. Aortic atherosclerosis. Aortic Atherosclerosis (ICD10-I70.0). Electronically Signed   By: Van Clines M.D.   On: 10/08/2019 15:12     ASSESSMENT/PLAN:  This is a very pleasant 59 year old African-American female diagnosed with stage IV colorectal cancer, adenocarcinoma.  She presented with a large cecal mass and extensive metastases to the liver, mild thoracic and abdominal adenopathy. She has several nonspecific pulmonary nodules which will be closely monitored.  She was diagnosed in March 2021. Initial CEA 8,068  The patient was seen with Dr. Benay Spice today.  Dr. Benay Spice had a lengthy discussion with the patient about her current condition and treatment options.  Dr. Benay Spice recommends for the patient to start treatment on FOLFOX.  Dr. Benay Spice  discussed with the patient and her daughter that her condition is not curable but is treatable.  He discussed the adverse side effects of treatment with the patient and her daughter.  The patient is interested in starting treatment with FOLFOX and she is expected to start her treatment on 4/1.   She will get a port-a-cath placed first thing tomorrow morning so her chemotherapy can be started on 4/1 as planned. EMLA prescribed to pharmacy, although reviewed she cannot use this for 1 week until after her port insertion.  Reviewed that she may use ice to numb her port site for cycle #1. Given her extensive liver metastases, the concern is that the window to initiate chemotherapy will be small. At this point in time, her LFTs and bilirubin are only slightly elevated. The concern is that if initiating treatment is delayed, then her LFTs in the interval may elevate to a level that would preclude [place] from being able to administer treatment/optimal dosage of treatment. She is also symptomatic  with weight loss, decreased appetite, night sweats, and abdominal pain/fullness.   She will have her chemoeducation class today.   Mismatch repair testing requested on 3/29 from her colon biopsy. The patient's liver biopsy has not been scheduled at this time. We will plan to send her liver biopsy for molecular studies with foundation one to see what other treatment options she is a candidate for in the future. Dr. Benay Spice may consider adding Avastin with her Folfox treatment for future cycles.   She will continue to take oxycodone every 6 hours as needed for pain.  She also was given Compazine every 6 hours for nausea.  Encouraged to continue taking her iron supplement for now.  We will recheck her iron studies in the future.  Referral for nutrition placed at the request of the patient's daughter regarding her weight loss.  I encouraged the patient to increase her caloric intake and protein intake.   We will see her back in  1 week for a 1 week follow up visit after completing her first cycle of chemotherapy.   The patient was advised to call immediately if he has any concerning symptoms in the interval. The patient voices understanding of current disease status and treatment options and is in agreement with the current care plan. All questions were answered. The patient knows to call the clinic with any problems, questions or concerns. We can certainly see the patient much sooner if necessary    Orders Placed This Encounter  Procedures  . CBC with Differential (Cancer Center Only)    Standing Status:   Standing    Number of Occurrences:   18    Standing Expiration Date:   10/11/2020  . CMP ([facility] only)    Standing Status:   Standing    Number of Occurrences:   18    Standing Expiration Date:   10/11/2020  . Ambulatory referral to Nutrition and Diabetic E    Referral Priority:   Routine    Referral Type:   Consultation    Referral Reason:   Specialty Services Required    Number of Visits Requested:   [place], PA-C 10/12/19 Rachel Frank was interviewed and examined.  She appears more comfortable today.  Oxycodone has helped the abdominal pain.  We discussed treatment options with Rachel Frank and her daughter.  Arrangements have been made for Port-A-Cath placement tomorrow.  She will begin chemotherapy on 10/14/2019.  I recommend FOLFOX.  We reviewed potential toxicities associated with the FOLFOX regimen including the chance for nausea/vomiting, mucositis, diarrhea, alopecia, and hematologic toxicity.  We discussed the rash, sun sensitivity, hyperpigmentation, and hand/foot syndrome seen with 5-fluorouracil.  We discussed the allergic reaction and various types of neuropathy associated with oxaliplatin she agrees to proceed.  She will attend a chemotherapy teaching class later today.  Rachel Frank will be scheduled for a liver biopsy to confirm a diagnosis of metastatic disease and  obtain tissue for molecular testing.  Julieanne Manson, MD

## 2019-10-12 NOTE — Research (Signed)
EXACT SCIENCES 2018-01 STUDY; BLOOD SAMPLE COLLECTION TO EVALUATE BIOMARKERS IN SUBJECTS WITH UNTREATED SOLID TUMORS.  Met with patient and her daughter in exam room along with another research nurse, Cameo Windham, RN, for 30 minutes. Consent and HIPAAs forms were reviewed in entirety, including blood draw procedure, data collection questions, alternatives to participation, and potential risks and benefits. Patient verbalizes that she does not have any questions and verbalizes desire to voluntarily participate. Consent and HIPAA forms version 3.0 (IRB approved 05/12/2018) were both signed and signed copies were provided to patient along with my business card should questions arise in the future. Blood work is scheduled to be drawn at 3:15pm this afternoon, prior to chemo education class and interview questions will be asked at that time. Eligibility was double checked by Cameo Windham, research RN and she confirms patient meets all inclusion and none of the exclusion criteria to be enrolled in the [id number] study.  Dr. Sherrill agrees patient meets eligibility criteria and signed eligibility form. Patient enrolled and assigned study ID# [id number]. Patient was thanked for her time and participation in this study.  M. , BSN, RN, CIC 10/12/2019 09:55AM  

## 2019-10-12 NOTE — Progress Notes (Unsigned)
DG 2  Gurjot Thielemann Female, 59 y.o., [birth year] MRN:  [id number] Phone:  [phone] Jerilynn Mages) PCP:  [name], MD Coverage:  Generic Commercial/Generic Commercial Next Appt With Radiology ([facility] 1) 10/13/2019 at 9:00 AM  RE: Biopsy Received: Today Message Contents  Arne Cleveland, MD  Ernestene Mention   [place] core liver lesion    [facility]   Previous Messages  ----- Message -----  From: Lenore Cordia  Sent: 10/12/2019  9:32 AM EDT  To: [facility] Procedure Requests  Subject: Biopsy                      Procedure Requested: [place] Core Biopsy (Liver)    Reason for Procedure: Recently diagnosed with CRC, needs multiple core biopsies for confirmation of metastatic disease as well as molecular testing    Provider Requesting: Heilingoetter, Cassandra  Provider Telephone: [phone]    Other Info: Rad Exams in Epic

## 2019-10-12 NOTE — Progress Notes (Signed)
START ON PATHWAY REGIMEN - Colorectal     A cycle is every 14 days:     Oxaliplatin      Leucovorin      Fluorouracil      Fluorouracil   **Always confirm dose/schedule in your pharmacy ordering system**  Patient Characteristics: Distant Metastases, Nonsurgical Candidate, KRAS/NRAS Mutation Positive/Unknown (BRAF V600 Wild-Type/Unknown), Standard Cytotoxic Therapy, First Line Standard Cytotoxic Therapy, Bevacizumab Ineligible, PS = 0,1 Tumor Location: Colon Therapeutic Status: Distant Metastases Microsatellite/Mismatch Repair Status: Unknown BRAF Mutation Status: Awaiting Test Results KRAS/NRAS Mutation Status: Awaiting Test Results Standard Cytotoxic Line of Therapy: First Line Standard Cytotoxic Therapy ECOG Performance Status: 1 Bevacizumab Eligibility: Ineligible Intent of Therapy: Non-Curative / Palliative Intent, Discussed with Patient 

## 2019-10-13 ENCOUNTER — Ambulatory Visit (HOSPITAL_COMMUNITY)
Admission: RE | Admit: 2019-10-13 | Discharge: 2019-10-13 | Disposition: A | Discharge status: Home / Self Care | Payer: No Typology Code available for payment source | Source: Ambulatory Visit | Attending: Physician Assistant | Admitting: Physician Assistant

## 2019-10-13 ENCOUNTER — Encounter (HOSPITAL_COMMUNITY): Payer: Self-pay

## 2019-10-13 ENCOUNTER — Other Ambulatory Visit: Payer: Self-pay

## 2019-10-13 ENCOUNTER — Other Ambulatory Visit: Payer: Self-pay | Admitting: Physician Assistant

## 2019-10-13 DIAGNOSIS — I10 Essential (primary) hypertension: Secondary | ICD-10-CM | POA: Diagnosis not present

## 2019-10-13 DIAGNOSIS — C787 Secondary malignant neoplasm of liver and intrahepatic bile duct: Secondary | ICD-10-CM | POA: Insufficient documentation

## 2019-10-13 DIAGNOSIS — E785 Hyperlipidemia, unspecified: Secondary | ICD-10-CM | POA: Insufficient documentation

## 2019-10-13 DIAGNOSIS — Z8042 Family history of malignant neoplasm of prostate: Secondary | ICD-10-CM | POA: Diagnosis not present

## 2019-10-13 DIAGNOSIS — Z8249 Family history of ischemic heart disease and other diseases of the circulatory system: Secondary | ICD-10-CM | POA: Insufficient documentation

## 2019-10-13 DIAGNOSIS — C19 Malignant neoplasm of rectosigmoid junction: Secondary | ICD-10-CM | POA: Diagnosis present

## 2019-10-13 DIAGNOSIS — R160 Hepatomegaly, not elsewhere classified: Secondary | ICD-10-CM | POA: Insufficient documentation

## 2019-10-13 DIAGNOSIS — Z79899 Other long term (current) drug therapy: Secondary | ICD-10-CM | POA: Diagnosis not present

## 2019-10-13 DIAGNOSIS — Z801 Family history of malignant neoplasm of trachea, bronchus and lung: Secondary | ICD-10-CM | POA: Diagnosis not present

## 2019-10-13 HISTORY — PX: IR IMAGING GUIDED PORT INSERTION: IMG5740

## 2019-10-13 HISTORY — PX: IR US GUIDE BX ASP/DRAIN: IMG2392

## 2019-10-13 LAB — PROTIME-INR
INR: 1.5 — ABNORMAL HIGH (ref 0.8–1.2)
Prothrombin Time: 17.9 seconds — ABNORMAL HIGH (ref 11.4–15.2)

## 2019-10-13 MED ORDER — MIDAZOLAM HCL 2 MG/2ML IJ SOLN
INTRAMUSCULAR | Status: AC
  Filled 2019-10-13: qty 2

## 2019-10-13 MED ORDER — SODIUM CHLORIDE 0.9 % IV SOLN: INTRAVENOUS | Status: DC

## 2019-10-13 MED ORDER — CEFAZOLIN SODIUM-DEXTROSE 2-4 GM/100ML-% IV SOLN: 2.0000 g | Freq: Once | INTRAVENOUS | Status: AC

## 2019-10-13 MED ORDER — LIDOCAINE HCL (PF) 1 % IJ SOLN
INTRAMUSCULAR | Status: DC | PRN
  Administered 2019-10-13: 5 mL

## 2019-10-13 MED ORDER — MIDAZOLAM HCL 2 MG/2ML IJ SOLN
INTRAMUSCULAR | Status: AC | PRN
  Administered 2019-10-13 (×2): 1 mg via INTRAVENOUS

## 2019-10-13 MED ORDER — LIDOCAINE HCL 1 % IJ SOLN
INTRAMUSCULAR | Status: AC
  Filled 2019-10-13: qty 20

## 2019-10-13 MED ORDER — GELATIN ABSORBABLE 12-7 MM EX MISC
CUTANEOUS | Status: AC
  Filled 2019-10-13: qty 1

## 2019-10-13 MED ORDER — FENTANYL CITRATE (PF) 100 MCG/2ML IJ SOLN
INTRAMUSCULAR | Status: AC | PRN
  Administered 2019-10-13: 25 ug via INTRAVENOUS
  Administered 2019-10-13: 50 ug via INTRAVENOUS
  Administered 2019-10-13: 25 ug via INTRAVENOUS

## 2019-10-13 MED ORDER — LIDOCAINE-EPINEPHRINE 1 %-1:100000 IJ SOLN
INTRAMUSCULAR | Status: AC
  Filled 2019-10-13: qty 1

## 2019-10-13 MED ORDER — CEFAZOLIN SODIUM-DEXTROSE 2-4 GM/100ML-% IV SOLN
INTRAVENOUS | Status: AC
  Administered 2019-10-13: 2 g via INTRAVENOUS
  Filled 2019-10-13: qty 100

## 2019-10-13 MED ORDER — LIDOCAINE HCL (PF) 1 % IJ SOLN
INTRAMUSCULAR | Status: AC | PRN
  Administered 2019-10-13: 10 mL

## 2019-10-13 MED ORDER — HYDROCODONE-ACETAMINOPHEN 5-325 MG PO TABS: 1.0000 | ORAL_TABLET | ORAL | Status: DC | PRN

## 2019-10-13 MED ORDER — HEPARIN SOD (PORK) LOCK FLUSH 100 UNIT/ML IV SOLN
INTRAVENOUS | Status: AC
  Filled 2019-10-13: qty 5

## 2019-10-13 MED ORDER — FENTANYL CITRATE (PF) 100 MCG/2ML IJ SOLN
INTRAMUSCULAR | Status: AC
  Filled 2019-10-13: qty 2

## 2019-10-13 NOTE — Procedures (Signed)
Interventional Radiology Procedure Note  Procedure: US Guided Biopsy of Liver lesion  Complications: None  Estimated Blood Loss: < 10 mL  Findings: 18 G core biopsy of right lobe liver mass performed under US guidance.  Three core samples obtained and sent to Pathology.  Venetia Night. Kathlene Cote, M.D Pager:  [phone]

## 2019-10-13 NOTE — H&P (Signed)
Chief Complaint: Patient was seen in consultation today for liver lesion biopsy and Port a cath placement at the request of [place]  Referring Physician(s): Heilingoetter,Cassandra L  Supervising Physician: Aletta Edouard  Patient Status: [facility] - Out-pt  History of Present Illness: Rachel Frank is a 59 y.o. female   Stage IV adenocarcinoma of the cecum. She presented with a large cecal mass, extensive hepatic metastatic disease; mild thoracic and abdominal adenopathy. She also has several small nonspecific pulmonary nodules. She was diagnosed in March 2021.    Weight loss, decreased appetite, night sweats, and abdominal pain/fullness.   CT 3/26: IMPRESSION: 1. Large cecal mass compatible with malignancy, with extensive hepatic metastatic disease; mild thoracic and abdominal adenopathy; adenopathy in the ileocecal region and extending to the central mesentery; and a small amount of ascites around the cecal mass and extending into the pelvis (early malignant ascites not excluded.). 2. Dilated and thickened appearing appendix possibly filled with debris or tumor, acute appendicitis not readily excluded although the appendix does not appear to be the epicenter of the regional edema. 3. Several small pulmonary nodules are technically nonspecific and merit surveillance. 4. Other imaging findings of potential clinical significance: 7 mm right kidney upper pole nonobstructive renal calculus. Uterine fibroids. Mild sigmoid colon diverticulosis. Lumbar spondylosis and degenerative disc disease.  Request made for [facility] placement- to start chemo tomorrow And Liver lesion biopsy     Past Medical History:  Diagnosis Date  . Anemia    iron deficiency  . Gynecological examination    [facility] exams  . Heart murmur   . Hyperlipidemia    on simvastatin  . Hypertension     Past Surgical History:  Procedure Laterality Date  . c  section    . COLONOSCOPY  09-16-14   per Dr. Ardis Hughs, adenomatous polyp, repeat in 3 months   . HEMORROIDECTOMY    . POLYPECTOMY      Allergies: Patient has no known allergies.  Medications: Prior to Admission medications   Medication Sig Start Date End Date Taking? Authorizing Provider  amLODipine (NORVASC) 5 MG tablet Take 1 tablet (5 mg total) by mouth daily. 08/20/19  Yes [name], MD  ascorbic acid (VITAMIN C) 500 MG tablet Take 500 mg by mouth daily.   Yes [provider]  atenolol (TENORMIN) 50 MG tablet TAKE ONE TABLET BY MOUTH EACH DAY Patient taking differently: Take 50 mg by mouth daily.  08/20/19  Yes [name], MD  Calcium Carbonate-Vitamin D (CALCIUM 600+D PO) Take 1 tablet by mouth daily.   Yes [provider]  hydrochlorothiazide (HYDRODIURIL) 25 MG tablet Take 1 tablet (25 mg total) by mouth daily. 08/20/19  Yes [name], MD  HYDROcodone-homatropine (HYDROMET) 5-1.5 MG/5ML syrup Take 5 mLs by mouth every 4 (four) hours as needed. Patient taking differently: Take 5 mLs by mouth at bedtime as needed for cough.  08/30/19  Yes [name], MD  oxyCODONE (ROXICODONE) 5 MG immediate release tablet Take 1 tablet (5 mg total) by mouth every 6 (six) hours as needed for severe pain. 10/11/19  Yes Heilingoetter, Cassandra L, PA-C  potassium chloride (KLOR-CON) 10 MEQ tablet Take 1 tablet by mouth  daily Patient taking differently: Take 10 mEq by mouth daily.  08/20/19  Yes [name], MD  simvastatin (ZOCOR) 40 MG tablet Take 1 tablet (40 mg total) by mouth at bedtime. 08/20/19  Yes [name], MD  lidocaine-prilocaine (EMLA)  cream Apply 1 application topically as needed. Patient taking differently: Apply 1 application topically as needed (port acess).  10/11/19   Heilingoetter, Cassandra L, PA-C  prochlorperazine (COMPAZINE) 10 MG tablet Take 1 tablet (10 mg total) by mouth every 6 (six) hours as needed for nausea or vomiting. 10/11/19   Heilingoetter,  Cassandra L, PA-C     Family History  Problem Relation Age of Onset  . Hypertension Other   . Cancer Other        lung, prostate  . Colon polyps Father   . Colon cancer Neg Hx   . Esophageal cancer Neg Hx   . Rectal cancer Neg Hx   . Stomach cancer Neg Hx     Social History   Socioeconomic History  . Marital status: Married    Spouse name: Not on file  . Number of children: Not on file  . Years of education: Not on file  . Highest education level: Not on file  Occupational History  . Not on file  Tobacco Use  . Smoking status: Never Smoker  . Smokeless tobacco: Never Used  Substance and Sexual Activity  . Alcohol use: No    Alcohol/week: 0.0 standard drinks  . Drug use: No  . Sexual activity: Not on file  Other Topics Concern  . Not on file  Social History Narrative  . Not on file   Social Determinants of Health   Financial Resource Strain:   . Difficulty of Paying Living Expenses:   Food Insecurity:   . Worried About Charity fundraiser in the Last Year:   . Arboriculturist in the Last Year:   Transportation Needs:   . Film/video editor (Medical):   Marland Kitchen Lack of Transportation (Non-Medical):   Physical Activity:   . Days of Exercise per Week:   . Minutes of Exercise per Session:   Stress:   . Feeling of Stress :   Social Connections:   . Frequency of Communication with Friends and Family:   . Frequency of Social Gatherings with Friends and Family:   . Attends Religious Services:   . Active Member of Clubs or Organizations:   . Attends Archivist Meetings:   Marland Kitchen Marital Status:     Review of Systems: A 12 point ROS discussed and pertinent positives are indicated in the HPI above.  All other systems are negative.  Review of Systems  Constitutional: Positive for activity change, appetite change, fatigue and unexpected weight change.  Respiratory: Positive for shortness of breath.   Cardiovascular: Negative for chest pain.  Gastrointestinal:  Positive for abdominal distention, abdominal pain, nausea and rectal pain.  Neurological: Positive for weakness.  Psychiatric/Behavioral: Negative for behavioral problems and confusion.    Vital Signs: BP 112/82   Pulse 86   Temp (!) 97.3 F (36.3 C) (Skin)   Resp 16   Ht 5' 2.5" (1.588 m)   Wt 178 lb (80.7 kg)   SpO2 99%   BMI 32.04 kg/m   Physical Exam Vitals reviewed.  Cardiovascular:     Rate and Rhythm: Regular rhythm.     Heart sounds: Normal heart sounds.  Pulmonary:     Effort: Pulmonary effort is normal.     Breath sounds: Normal breath sounds.  Abdominal:     General: There is distension.     Tenderness: There is abdominal tenderness.  Musculoskeletal:        General: Normal range of motion.  Skin:  General: Skin is warm and dry.  Neurological:     Mental Status: She is alert and oriented to person, place, and time.  Psychiatric:        Behavior: Behavior normal.        Thought Content: Thought content normal.        Judgment: Judgment normal.     Imaging: CT CHEST W CONTRAST  Result Date: 10/08/2019 CLINICAL DATA:  Newly diagnosed cecal adenocarcinoma. Abdominal bloating, fatigue, and abdominal pain along with shortness of breath over the last year. Staging workup. EXAM: CT CHEST, ABDOMEN, AND PELVIS WITH CONTRAST TECHNIQUE: Multidetector CT imaging of the chest, abdomen and pelvis was performed following the standard protocol during bolus administration of intravenous contrast. CONTRAST:  142mL OMNIPAQUE IOHEXOL 300 MG/ML  SOLN COMPARISON:  Chest radiograph 08/20/2019 FINDINGS: CT CHEST FINDINGS Cardiovascular: Unremarkable Mediastinum/Nodes: 1.1 by 0.6 cm right thyroid lobe nodule on image 5/2. No followup recommended (ref: J Am Coll Radiol. 2015 Feb;12(2): 143-50). Prevascular node 1.1 cm in short axis on image 16/2. Right lower paratracheal node 0.7 cm in short axis on image 21/2. Right hilar lymph node 0.7 cm in short axis, image 24/2. Left hilar lymph  node 1.0 cm in short axis, image 25/2. Lungs/Pleura: A right middle lobe nodule measures 0.7 by 0.4 by 0.5 cm on image 72/3. 2 mm left upper lobe nodule on image 56/5. 2 by 3 mm left lower lobe nodule on image 64/3. Musculoskeletal: Thoracic spondylosis. CT ABDOMEN PELVIS FINDINGS Hepatobiliary: Unfortunately there is marked widespread metastatic disease throughout all lobes of the liver. Overall metastatic burden is heavy. The lesion posteriorly in the lateral segment left hepatic lobe measures 8.1 by 6.5 cm on image 56/2. Contracted gallbladder. No significant biliary dilatation. Mild perihepatic ascites. Pancreas: Unremarkable Spleen: Unremarkable Adrenals/Urinary Tract: 7 mm right kidney upper pole nonobstructive renal calculus. 1.3 by 1.0 cm hypodense lesion of the left kidney upper pole is technically too small to characterize although probably a cyst. The urinary bladder appears unremarkable. Adrenal glands appear unremarkable. Stomach/Bowel: A cecal mass measuring about 7.3 by 4.4 by 4.8 cm is present. This is in the vicinity of the ileocecal valve. There is abnormal distension of wall thickening of the appendix with appendiceal diameter 1.3 cm on image 95/2. Right paracolic and ileocolic adenopathy with a node measuring 1.6 cm in short axis on image 90/2. This extends towards the central mesentery where there is a 1.3 cm mesenteric node on image 85/2. There is potentially local spread of tumor from the cecal mass into the adjacent pericecal tissues. Wall thickening in the terminal ileum noted. Mild sigmoid colon diverticulosis. Vascular/Lymphatic: Aortoiliac atherosclerotic vascular disease. Pathologic retroperitoneal adenopathy noted including an aortocaval node measuring 1.4 cm in short axis on image 70/2. There is suspected adenopathy in the porta hepatis including a 1.8 cm porta hepatis node on image 65/2. Reproductive: Uterine fibroids noted. Other: There is a small amount of ascites around the cecal  mass and extending into the pelvis. Malignant ascites is not excluded. Musculoskeletal: Lumbar spondylosis and degenerative disc disease. IMPRESSION: 1. Large cecal mass compatible with malignancy, with extensive hepatic metastatic disease; mild thoracic and abdominal adenopathy; adenopathy in the ileocecal region and extending to the central mesentery; and a small amount of ascites around the cecal mass and extending into the pelvis (early malignant ascites not excluded.). 2. Dilated and thickened appearing appendix possibly filled with debris or tumor, acute appendicitis not readily excluded although the appendix does not appear to be the epicenter  of the regional edema. 3. Several small pulmonary nodules are technically nonspecific and merit surveillance. 4. Other imaging findings of potential clinical significance: 7 mm right kidney upper pole nonobstructive renal calculus. Uterine fibroids. Mild sigmoid colon diverticulosis. Lumbar spondylosis and degenerative disc disease. 5. Aortic atherosclerosis. Aortic Atherosclerosis (ICD10-I70.0). Electronically Signed   By: Van Clines M.D.   On: 10/08/2019 15:12   CT ABDOMEN PELVIS W CONTRAST  Result Date: 10/08/2019 CLINICAL DATA:  Newly diagnosed cecal adenocarcinoma. Abdominal bloating, fatigue, and abdominal pain along with shortness of breath over the last year. Staging workup. EXAM: CT CHEST, ABDOMEN, AND PELVIS WITH CONTRAST TECHNIQUE: Multidetector CT imaging of the chest, abdomen and pelvis was performed following the standard protocol during bolus administration of intravenous contrast. CONTRAST:  122mL OMNIPAQUE IOHEXOL 300 MG/ML  SOLN COMPARISON:  Chest radiograph 08/20/2019 FINDINGS: CT CHEST FINDINGS Cardiovascular: Unremarkable Mediastinum/Nodes: 1.1 by 0.6 cm right thyroid lobe nodule on image 5/2. No followup recommended (ref: J Am Coll Radiol. 2015 Feb;12(2): 143-50). Prevascular node 1.1 cm in short axis on image 16/2. Right lower  paratracheal node 0.7 cm in short axis on image 21/2. Right hilar lymph node 0.7 cm in short axis, image 24/2. Left hilar lymph node 1.0 cm in short axis, image 25/2. Lungs/Pleura: A right middle lobe nodule measures 0.7 by 0.4 by 0.5 cm on image 72/3. 2 mm left upper lobe nodule on image 56/5. 2 by 3 mm left lower lobe nodule on image 64/3. Musculoskeletal: Thoracic spondylosis. CT ABDOMEN PELVIS FINDINGS Hepatobiliary: Unfortunately there is marked widespread metastatic disease throughout all lobes of the liver. Overall metastatic burden is heavy. The lesion posteriorly in the lateral segment left hepatic lobe measures 8.1 by 6.5 cm on image 56/2. Contracted gallbladder. No significant biliary dilatation. Mild perihepatic ascites. Pancreas: Unremarkable Spleen: Unremarkable Adrenals/Urinary Tract: 7 mm right kidney upper pole nonobstructive renal calculus. 1.3 by 1.0 cm hypodense lesion of the left kidney upper pole is technically too small to characterize although probably a cyst. The urinary bladder appears unremarkable. Adrenal glands appear unremarkable. Stomach/Bowel: A cecal mass measuring about 7.3 by 4.4 by 4.8 cm is present. This is in the vicinity of the ileocecal valve. There is abnormal distension of wall thickening of the appendix with appendiceal diameter 1.3 cm on image 95/2. Right paracolic and ileocolic adenopathy with a node measuring 1.6 cm in short axis on image 90/2. This extends towards the central mesentery where there is a 1.3 cm mesenteric node on image 85/2. There is potentially local spread of tumor from the cecal mass into the adjacent pericecal tissues. Wall thickening in the terminal ileum noted. Mild sigmoid colon diverticulosis. Vascular/Lymphatic: Aortoiliac atherosclerotic vascular disease. Pathologic retroperitoneal adenopathy noted including an aortocaval node measuring 1.4 cm in short axis on image 70/2. There is suspected adenopathy in the porta hepatis including a 1.8 cm  porta hepatis node on image 65/2. Reproductive: Uterine fibroids noted. Other: There is a small amount of ascites around the cecal mass and extending into the pelvis. Malignant ascites is not excluded. Musculoskeletal: Lumbar spondylosis and degenerative disc disease. IMPRESSION: 1. Large cecal mass compatible with malignancy, with extensive hepatic metastatic disease; mild thoracic and abdominal adenopathy; adenopathy in the ileocecal region and extending to the central mesentery; and a small amount of ascites around the cecal mass and extending into the pelvis (early malignant ascites not excluded.). 2. Dilated and thickened appearing appendix possibly filled with debris or tumor, acute appendicitis not readily excluded although the appendix does not appear  to be the epicenter of the regional edema. 3. Several small pulmonary nodules are technically nonspecific and merit surveillance. 4. Other imaging findings of potential clinical significance: 7 mm right kidney upper pole nonobstructive renal calculus. Uterine fibroids. Mild sigmoid colon diverticulosis. Lumbar spondylosis and degenerative disc disease. 5. Aortic atherosclerosis. Aortic Atherosclerosis (ICD10-I70.0). Electronically Signed   By: Van Clines M.D.   On: 10/08/2019 15:12    Labs:  CBC: Recent Labs    08/20/19 1004 09/01/19 1333 10/11/19 1432 10/12/19 1522  WBC 6.5 8.7 9.4 9.4  HGB 9.2* 9.5* 11.3* 11.1*  HCT 30.1* 32.0* 36.5 36.1  PLT 476.0* 626* 574* 602*    COAGS: No results for input(s): INR, APTT in the last 8760 hours.  BMP: Recent Labs    08/20/19 1004 08/20/19 1004 09/01/19 1333 10/06/19 1214 10/11/19 1432 10/12/19 1522  NA 135  --  137  --  132* 129*  K 3.9  --  3.6  --  4.0 4.0  CL 99  --  99  --  94* 93*  CO2 25  --  27  --  23 25  GLUCOSE 99  --  99  --  113* 95  BUN 10   < > 11 11 11 11   CALCIUM 9.2  --  8.9  --  9.0 8.7*  CREATININE 0.77  --  0.80 0.77 0.71 0.73  GFRNONAA  --   --  >60  --   >60 >60  GFRAA  --   --  >60  --  >60 >60   < > = values in this interval not displayed.    LIVER FUNCTION TESTS: Recent Labs    08/20/19 1004 09/01/19 1333 10/11/19 1432 10/12/19 1522  BILITOT 0.5 0.5 1.7* 1.2  AST 53* 80* 117* 102*  ALT 47* 56* 37 37  ALKPHOS 424* 539* 940* 910*  PROT 8.1 8.6* 8.4* 7.9  ALBUMIN 3.4* 2.7* 2.2* 2.2*    TUMOR MARKERS: Recent Labs    10/06/19 1214  CEA 8,068.8*    Assessment and Plan:  New colorectal cancer diagnosis Hepatic lesions  Wt loss; abd distension and pain Scheduled for liver lesion biopsy and Port a cath placement To start chemo tomorrow  Risks and benefits of liver lesion biopsy was discussed with the patient and/or patient's family including, but not limited to bleeding, infection, damage to adjacent structures or low yield requiring additional tests. All of the questions were answered and there is agreement to proceed. Consent signed and in chart.  Risks and benefits of image guided port-a-catheter placement was discussed with the patient including, but not limited to bleeding, infection, pneumothorax, or fibrin sheath development and need for additional procedures. All of the patient's questions were answered, patient is agreeable to proceed. Consent signed and in chart.   Thank you for this interesting consult.  I greatly enjoyed meeting Rachel Frank and look forward to participating in their care.  A copy of this report was sent to the requesting provider on this date.  Electronically Signed: Lavonia Drafts, PA-C 10/13/2019, 8:37 AM   I spent a total of  30 Minutes   in face to face in clinical consultation, greater than 50% of which was counseling/coordinating care for liver lesion biopsy and PAC placement

## 2019-10-13 NOTE — Discharge Instructions (Addendum)
Liver Biopsy, Care After These instructions give you information about how to care for yourself after your procedure. Your health care provider may also give you more specific instructions. If you have problems or questions, contact your health care provider. What can I expect after the procedure? After your procedure, it is common to have:  Pain and soreness in the area where the biopsy was done.  Bruising around the area where the biopsy was done.  Sleepiness and fatigue for 1-2 days. Follow these instructions at home: Medicines  Take over-the-counter and prescription medicines only as told by your health care provider.  If you were prescribed an antibiotic medicine, take it as told by your health care provider. Do not stop taking the antibiotic even if you start to feel better.  Do not take medicines such as aspirin and ibuprofen unless your health care provider tells you to take them. These medicines thin your blood and can increase the risk of bleeding.  If you are taking prescription pain medicine, take actions to prevent or treat constipation. Your health care provider may recommend that you: ? Drink enough fluid to keep your urine pale yellow. ? Eat foods that are high in fiber, such as fresh fruits and vegetables, whole grains, and beans. ? Limit foods that are high in fat and processed sugars, such as fried or sweet foods. ? Take an over-the-counter or prescription medicine for constipation. Incision care  Follow instructions from your health care provider about how to take care of your incision. Make sure you: ? Wash your hands with soap and water before you change your bandage (dressing). If soap and water are not available, use hand sanitizer. ? Change your dressing as told by your health care provider. ? Leave stitches (sutures), skin glue, or adhesive strips in place. These skin closures may need to stay in place for 2 weeks or longer. If adhesive strip edges start to  loosen and curl up, you may trim the loose edges. Do not remove adhesive strips completely unless your health care provider tells you to do that.  Check your incision area every day for signs of infection. Check for: ? Redness, swelling, or pain. ? Fluid or blood. ? Warmth. ? Pus or a bad smell.  Do not take baths, swim, or use a hot tub until your health care provider says it is okay to do so. Activity   Rest at home for 1-2 days, or as directed by your health care provider. ? Avoid sitting for a long time without moving. Get up to take short walks every 1-2 hours. This is important to improve blood flow and breathing. Ask for help if you feel weak or unsteady.  Return to your normal activities as told by your health care provider. Ask your health care provider what activities are safe for you.  Do not drive or use heavy machinery while taking prescription pain medicine.  Do not lift anything that is heavier than 10 lb (4.5 kg), or the limit that your health care provider tells you, until he or she says that it is safe.  Do not play contact sports for 2 weeks after the procedure. General instructions   Do not drink alcohol in the first week after the procedure.  Have someone stay with you for at least 24 hours after the procedure.  It is your responsibility to obtain your test results. Ask your health care provider, or the department that is doing the test: ? When will my   results be ready? ? How will I get my results? ? What are my treatment options? ? What other tests do I need? ? What are my next steps?  Keep all follow-up visits as told by your health care provider. This is important. Contact a health care provider if:  You have increased bleeding from an incision, resulting in more than a small spot of blood.  You have redness, swelling, or increasing pain in any incisions.  You notice a discharge or a bad smell coming from any of your incisions.  You have a fever or  chills. Get help right away if:  You develop swelling, bloating, or pain in your abdomen.  You become dizzy or faint.  You develop a rash.  You have nausea or you vomit.  You faint, or you have shortness of breath or difficulty breathing.  You develop chest pain.  You have problems with your speech or vision.  You have trouble with your balance or moving your arms or legs. Summary  After the liver biopsy, it is common to have pain, soreness, and bruising in the area, as well as sleepiness and fatigue.  Take over-the-counter and prescription medicines only as told by your health care provider.  Follow instructions from your health care provider about how to care for your incision. Check the incision area daily for signs of infection. This information is not intended to replace advice given to you by your health care provider. Make sure you discuss any questions you have with your health care provider. Document Revised: 08/24/2018 Document Reviewed: 07/11/2017   Elsevier Patient Education  [place] Insertion, Care After This sheet gives you information about how to care for yourself after your procedure. Your health care provider may also give you more specific instructions. If you have problems or questions, contact your health care provider. What can I expect after the procedure? After the procedure, it is common to have:  Discomfort at the port insertion site.  Bruising on the skin over the port. This should improve over 3-4 days. Follow these instructions at home: [facility] care  After your port is placed, you will get a manufacturer's information card. The card has information about your port. Keep this card with you at all times.  Take care of the port as told by your health care provider. Ask your health care provider if you or a family member can get training for taking care of the port at home. A home health care nurse may also take care of the  port.  Make sure to remember what type of port you have. Incision care      Follow instructions from your health care provider about how to take care of your port insertion site. Make sure you: ? Wash your hands with soap and water before and after you change your bandage (dressing). If soap and water are not available, use hand sanitizer. ? Change your dressing as told by your health care provider. ? Leave stitches (sutures), skin glue, or adhesive strips in place. These skin closures may need to stay in place for 2 weeks or longer. If adhesive strip edges start to loosen and curl up, you may trim the loose edges. Do not remove adhesive strips completely unless your health care provider tells you to do that.  Check your port insertion site every day for signs of infection. Check for: ? Redness, swelling, or pain. ? Fluid or blood. ? Warmth. ? Pus or a  bad smell. Activity  Return to your normal activities as told by your health care provider. Ask your health care provider what activities are safe for you.  Do not lift anything that is heavier than 10 lb (4.5 kg), or the limit that you are told, until your health care provider says that it is safe. General instructions  Take over-the-counter and prescription medicines only as told by your health care provider.  Do not take baths, swim, or use a hot tub until your health care provider approves. Ask your health care provider if you may take showers. You may only be allowed to take sponge baths.  Do not drive for 24 hours if you were given a sedative during your procedure.  Wear a medical alert bracelet in case of an emergency. This will tell any health care providers that you have a port.  Keep all follow-up visits as told by your health care provider. This is important. Contact a health care provider if:  You cannot flush your port with saline as directed, or you cannot draw blood from the port.  You have a fever or chills.  You  have redness, swelling, or pain around your port insertion site.  You have fluid or blood coming from your port insertion site.  Your port insertion site feels warm to the touch.  You have pus or a bad smell coming from the port insertion site. Get help right away if:  You have chest pain or shortness of breath.  You have bleeding from your port that you cannot control. Summary  Take care of the port as told by your health care provider. Keep the manufacturer's information card with you at all times.  Change your dressing as told by your health care provider.  Contact a health care provider if you have a fever or chills or if you have redness, swelling, or pain around your port insertion site.  Keep all follow-up visits as told by your health care provider. This information is not intended to replace advice given to you by your health care provider. Make sure you discuss any questions you have with your health care provider. Document Revised: 01/27/2018 Document Reviewed: 01/27/2018 Elsevier Patient Education  [place]. Moderate Conscious Sedation, Adult Sedation is the use of medicines to promote relaxation and relieve discomfort and anxiety. Moderate conscious sedation is a type of sedation. Under moderate conscious sedation, you are less alert than normal, but you are still able to respond to instructions, touch, or both. Moderate conscious sedation is used during short medical and dental procedures. It is milder than deep sedation, which is a type of sedation under which you cannot be easily woken up. It is also milder than general anesthesia, which is the use of medicines to make you unconscious. Moderate conscious sedation allows you to return to your regular activities sooner. Tell a health care provider about:  Any allergies you have.  All medicines you are taking, including vitamins, herbs, eye drops, creams, and over-the-counter medicines.  Use of steroids (by  mouth or creams).  Any problems you or family members have had with sedatives and anesthetic medicines.  Any blood disorders you have.  Any surgeries you have had.  Any medical conditions you have, such as sleep apnea.  Whether you are pregnant or may be pregnant.  Any use of cigarettes, alcohol, marijuana, or street drugs. What are the risks? Generally, this is a safe procedure. However, problems may occur, including:  Getting too much  medicine (oversedation).  Nausea.  Allergic reaction to medicines.  Trouble breathing. If this happens, a breathing tube may be used to help with breathing. It will be removed when you are awake and breathing on your own.  Heart trouble.  Lung trouble. What happens before the procedure? Staying hydrated Follow instructions from your health care provider about hydration, which may include:  Up to 2 hours before the procedure - you may continue to drink clear liquids, such as water, clear fruit juice, black coffee, and plain tea. Eating and drinking restrictions Follow instructions from your health care provider about eating and drinking, which may include:  8 hours before the procedure - stop eating heavy meals or foods such as meat, fried foods, or fatty foods.  6 hours before the procedure - stop eating light meals or foods, such as toast or cereal.  6 hours before the procedure - stop drinking milk or drinks that contain milk.  2 hours before the procedure - stop drinking clear liquids. Medicine Ask your health care provider about:  Changing or stopping your regular medicines. This is especially important if you are taking diabetes medicines or blood thinners.  Taking medicines such as aspirin and ibuprofen. These medicines can thin your blood. Do not take these medicines before your procedure if your health care provider instructs you not to.  Tests and exams  You will have a physical exam.  You may have blood tests done to  show: ? How well your kidneys and liver are working. ? How well your blood can clot. General instructions  Plan to have someone take you home from the hospital or clinic.  If you will be going home right after the procedure, plan to have someone with you for 24 hours. What happens during the procedure?  An IV tube will be inserted into one of your veins.  Medicine to help you relax (sedative) will be given through the IV tube.  The medical or dental procedure will be performed. What happens after the procedure?  Your blood pressure, heart rate, breathing rate, and blood oxygen level will be monitored often until the medicines you were given have worn off.  Do not drive for 24 hours. This information is not intended to replace advice given to you by your health care provider. Make sure you discuss any questions you have with your health care provider. Document Revised: 06/13/2017 Document Reviewed: 10/21/2015 Elsevier Patient Education  2020 [employer].

## 2019-10-13 NOTE — Sedation Documentation (Signed)
Port-a-cath procedure ended, patient being repositioned and room being set up for liver biopsy. Patient alert and oriented, talking with staff. Patient appeared sweaty and reported being hot. Patient given a cool wash cloth and reported feeling better.

## 2019-10-13 NOTE — Procedures (Signed)
Interventional Radiology Procedure Note  Procedure: Single Lumen Power Port Placement    Access:  Right IJ vein.  Findings: Catheter tip positioned at SVC/RA junction. Port is ready for immediate use.   Complications: None  EBL: < 10 mL  Recommendations:  - Ok to shower in 24 hours - Do not submerge for 7 days - Routine line care   Zack Crager T. Tatanisha Cuthbert, M.D Pager:  [phone]   

## 2019-10-14 ENCOUNTER — Inpatient Hospital Stay: Payer: No Typology Code available for payment source | Attending: Physician Assistant

## 2019-10-14 ENCOUNTER — Other Ambulatory Visit: Payer: Self-pay | Admitting: Physician Assistant

## 2019-10-14 ENCOUNTER — Telehealth: Payer: Self-pay | Admitting: Oncology

## 2019-10-14 ENCOUNTER — Other Ambulatory Visit: Payer: Self-pay

## 2019-10-14 ENCOUNTER — Other Ambulatory Visit: Payer: PRIVATE HEALTH INSURANCE

## 2019-10-14 ENCOUNTER — Ambulatory Visit: Payer: PRIVATE HEALTH INSURANCE

## 2019-10-14 VITALS — BP 108/69 | HR 90 | Temp 98.3°F | Resp 18

## 2019-10-14 DIAGNOSIS — E785 Hyperlipidemia, unspecified: Secondary | ICD-10-CM | POA: Insufficient documentation

## 2019-10-14 DIAGNOSIS — C180 Malignant neoplasm of cecum: Secondary | ICD-10-CM | POA: Insufficient documentation

## 2019-10-14 DIAGNOSIS — R918 Other nonspecific abnormal finding of lung field: Secondary | ICD-10-CM | POA: Diagnosis not present

## 2019-10-14 DIAGNOSIS — K5900 Constipation, unspecified: Secondary | ICD-10-CM | POA: Insufficient documentation

## 2019-10-14 DIAGNOSIS — C787 Secondary malignant neoplasm of liver and intrahepatic bile duct: Secondary | ICD-10-CM | POA: Diagnosis present

## 2019-10-14 DIAGNOSIS — D509 Iron deficiency anemia, unspecified: Secondary | ICD-10-CM | POA: Diagnosis not present

## 2019-10-14 DIAGNOSIS — Z5111 Encounter for antineoplastic chemotherapy: Secondary | ICD-10-CM | POA: Diagnosis not present

## 2019-10-14 DIAGNOSIS — I10 Essential (primary) hypertension: Secondary | ICD-10-CM | POA: Insufficient documentation

## 2019-10-14 DIAGNOSIS — G893 Neoplasm related pain (acute) (chronic): Secondary | ICD-10-CM | POA: Insufficient documentation

## 2019-10-14 DIAGNOSIS — Z79899 Other long term (current) drug therapy: Secondary | ICD-10-CM | POA: Insufficient documentation

## 2019-10-14 DIAGNOSIS — R05 Cough: Secondary | ICD-10-CM | POA: Insufficient documentation

## 2019-10-14 DIAGNOSIS — C19 Malignant neoplasm of rectosigmoid junction: Secondary | ICD-10-CM

## 2019-10-14 LAB — SURGICAL PATHOLOGY

## 2019-10-14 MED ORDER — DEXTROSE 5 % IV SOLN
Freq: Once | INTRAVENOUS | Status: AC
  Filled 2019-10-14: qty 250

## 2019-10-14 MED ORDER — LEUCOVORIN CALCIUM INJECTION 350 MG
300.0000 mg/m2 | Freq: Once | INTRAVENOUS | Status: AC
  Administered 2019-10-14: 564 mg via INTRAVENOUS
  Filled 2019-10-14: qty 28.2

## 2019-10-14 MED ORDER — PALONOSETRON HCL INJECTION 0.25 MG/5ML
INTRAVENOUS | Status: AC
  Filled 2019-10-14: qty 5

## 2019-10-14 MED ORDER — DEXAMETHASONE SODIUM PHOSPHATE 10 MG/ML IJ SOLN
INTRAMUSCULAR | Status: AC
  Filled 2019-10-14: qty 1

## 2019-10-14 MED ORDER — SODIUM CHLORIDE 0.9 % IV SOLN: 10.0000 mg | Freq: Once | INTRAVENOUS | Status: DC

## 2019-10-14 MED ORDER — DEXAMETHASONE SODIUM PHOSPHATE 10 MG/ML IJ SOLN
10.0000 mg | Freq: Once | INTRAMUSCULAR | Status: AC
  Administered 2019-10-14: 10 mg via INTRAVENOUS

## 2019-10-14 MED ORDER — PALONOSETRON HCL INJECTION 0.25 MG/5ML
0.2500 mg | Freq: Once | INTRAVENOUS | Status: AC
  Administered 2019-10-14: 0.25 mg via INTRAVENOUS

## 2019-10-14 MED ORDER — SODIUM CHLORIDE 0.9 % IV SOLN
1800.0000 mg/m2 | INTRAVENOUS | Status: DC
  Administered 2019-10-14: 3400 mg via INTRAVENOUS
  Filled 2019-10-14: qty 68

## 2019-10-14 MED ORDER — OXALIPLATIN CHEMO INJECTION 100 MG/20ML
85.0000 mg/m2 | Freq: Once | INTRAVENOUS | Status: AC
  Administered 2019-10-14: 160 mg via INTRAVENOUS
  Filled 2019-10-14: qty 32

## 2019-10-14 NOTE — Patient Instructions (Signed)
[place] Discharge Instructions for Patients Receiving Chemotherapy  Today you received the following chemotherapy agents Oxaliplatin, irinotecan, 5FU  To help prevent nausea and vomiting after your treatment, we encourage you to take your nausea medication as directed   If you develop nausea and vomiting that is not controlled by your nausea medication, call the clinic.   BELOW ARE SYMPTOMS THAT SHOULD BE REPORTED IMMEDIATELY:  *FEVER GREATER THAN 100.5 F  *CHILLS WITH OR WITHOUT FEVER  NAUSEA AND VOMITING THAT IS NOT CONTROLLED WITH YOUR NAUSEA MEDICATION  *UNUSUAL SHORTNESS OF BREATH  *UNUSUAL BRUISING OR BLEEDING  TENDERNESS IN MOUTH AND THROAT WITH OR WITHOUT PRESENCE OF ULCERS  *URINARY PROBLEMS  *BOWEL PROBLEMS  UNUSUAL RASH Items with * indicate a potential emergency and should be followed up as soon as possible.  Feel free to call the clinic should you have any questions or concerns. The clinic phone number is [phone].  Please show the [place] at check-in to the Emergency Department and triage nurse.

## 2019-10-14 NOTE — Progress Notes (Signed)
Per MD okay to treat with AST 102

## 2019-10-14 NOTE — Telephone Encounter (Signed)
Scheduled per los. Printout given in infusion  

## 2019-10-16 ENCOUNTER — Other Ambulatory Visit: Payer: Self-pay

## 2019-10-16 ENCOUNTER — Inpatient Hospital Stay: Payer: No Typology Code available for payment source

## 2019-10-16 VITALS — BP 112/79 | HR 81 | Temp 98.3°F | Resp 18

## 2019-10-16 DIAGNOSIS — Z5111 Encounter for antineoplastic chemotherapy: Secondary | ICD-10-CM | POA: Diagnosis not present

## 2019-10-16 DIAGNOSIS — C19 Malignant neoplasm of rectosigmoid junction: Secondary | ICD-10-CM

## 2019-10-16 MED ORDER — HEPARIN SOD (PORK) LOCK FLUSH 100 UNIT/ML IV SOLN
500.0000 [IU] | Freq: Once | INTRAVENOUS | Status: AC | PRN
  Administered 2019-10-16: 500 [IU]
  Filled 2019-10-16: qty 5

## 2019-10-16 MED ORDER — SODIUM CHLORIDE 0.9% FLUSH
10.0000 mL | INTRAVENOUS | Status: DC | PRN
  Administered 2019-10-16: 10 mL
  Filled 2019-10-16: qty 10

## 2019-10-16 NOTE — Patient Instructions (Signed)
[facility] Discharge Instructions for Patients Receiving Chemotherapy  Today you received the completed chemotherapy agents, 5FU  To help prevent nausea and vomiting after your treatment, we encourage you to take your nausea medication as directed   If you develop nausea and vomiting that is not controlled by your nausea medication, call the clinic.   BELOW ARE SYMPTOMS THAT SHOULD BE REPORTED IMMEDIATELY:  *FEVER GREATER THAN 100.5 F  *CHILLS WITH OR WITHOUT FEVER  NAUSEA AND VOMITING THAT IS NOT CONTROLLED WITH YOUR NAUSEA MEDICATION  *UNUSUAL SHORTNESS OF BREATH  *UNUSUAL BRUISING OR BLEEDING  TENDERNESS IN MOUTH AND THROAT WITH OR WITHOUT PRESENCE OF ULCERS  *URINARY PROBLEMS  *BOWEL PROBLEMS  UNUSUAL RASH Items with * indicate a potential emergency and should be followed up as soon as possible.  Feel free to call the clinic should you have any questions or concerns. The clinic phone number is [phone].  Please show the [place] at check-in to the Emergency Department and triage nurse.

## 2019-10-18 ENCOUNTER — Other Ambulatory Visit: Payer: Self-pay

## 2019-10-18 ENCOUNTER — Inpatient Hospital Stay: Payer: No Typology Code available for payment source | Admitting: Nutrition

## 2019-10-18 NOTE — Progress Notes (Signed)
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  She is a patient of Dr. Benay Spice.  She will receive FOLFOX IV every 2 weeks.  Past medical history includes hyperlipidemia, hypertension, and anemia.  Medications include vitamin C, calcium, Zocor, and Compazine.  Labs include sodium 129 and albumin 2.2 on March 30  Height: 5 feet 2 and half inches. Weight: 178 pounds on March 31. Usual body weight: 195 pounds in February 2021. BMI: 32.04.  Patient reports she has nausea and vomiting.  She does not always take her nausea medicine. She has a decreased appetite and constipation. Patient has lost 9% of her body weight over 2 months which is significant. She presents today with her daughter.  Her family has tried to encourage patient to eat healthier and have been offering patient healthier foods which she does not like.  They have been restricting sugar and carbohydrates in general because they think it is going to make her cancer grow.  Patient states she is only eating spoonfuls of food 2 times a day with an occasional boost or Pediasure.  Nutrition diagnosis:  Severe malnutrition related to stage IV metastatic cancer as evidenced by 9% weight loss in 2 months and less than or equal to 50% of energy intake for greater or equal to 5 days.  Estimated nutrition needs: 1950-2150 cal, 90-105 g protein, 2.0 L fluid daily  Intervention: I educated patient on the importance of increasing calories and protein in small frequent meals throughout the day. Educated her about a healthier diet without restricting healthy carbohydrates.  Provided education on sugar and cancer. Encourage patient to work up to eating every 2 hours. Provided samples of oral nutrition supplements. Provided fact sheets on increasing calories and protein, high-protein foods, constipation, sugar and cancer, and reliable resources for education.  Monitoring, evaluation,  goals: Patient will tolerate increased calories and protein to minimize weight loss.  Next visit: Wednesday, April 14 during infusion.  **Disclaimer: This note was dictated with voice recognition software. Similar sounding words can inadvertently be transcribed and this note may contain transcription errors which may not have been corrected upon publication of note.**

## 2019-10-20 NOTE — Progress Notes (Signed)
[place] OFFICE PROGRESS NOTE  [name], MD [place] [address]  DIAGNOSIS:  1) Stage IV adenocarcinoma of the cecum. She presented with a large cecal mass, extensive hepatic metastatic disease; mild thoracic and abdominal adenopathy. She also has several small nonspecific pulmonary nodules. She was diagnosed in March 2021.  2) Iron deficiency anemia secondary to #1. 3.  Pain secondary to #1  MMR: Normal Foundation one testing: Pending  PRIOR THERAPY: None  CURRENT THERAPY:  1) Oral iron supplements and IV iron with venofer as needed. 2) FOLFOX IV every 2 weeks. First dose on 10/14/19. Status post 1 cycle.   INTERVAL HISTORY: Janeil Schexnayder 59 y.o. female returns to the clinic for a follow up visit accompanied by her daughter. The patient was recently diagnosed with stage IV colorectal cancer. She completed her first cycle of FOLFOX last week and she tolerated it well without any adverse side effects.  She denies any recent fever, chills, or any more night sweats.  She met with a member of the nutritionist team last week which was helpful.  The patient actually gained 2 pounds since her last appointment.  She received samples of Ensure as well as education on food recommendations.  They are planning to meet up with her at her next infusion.   Regarding her cancer related abdominal pain likely secondary to hepatomegaly, she is prescribed oxycodone.  Her pain has been tolerable the last 4 days and she has not needed to take any pain medication.  The pain medication also caused her to be constipated.  She is not constipated at this time.  The patient and her daughter inquired about stool softeners vs. Laxatives. Her last bowel movement was yesterday.  She denies any melena.   She denies any nausea or vomiting.  Denies any headache or visual changes.  Denies any rashes or skin changes.  She had a mild self-limiting nosebleed in the interval since her  last appointment.  The patient has been experiencing a dry cough at nighttime since January 2021.  This cough had prompted her initial visit to see her primary care provider.  He had prescribed her cough medication which helps her cough.  She ran out of this and is requesting a refill as it is helpful to allow her to sleep better. She denies any sore throat, nasal congestion, or worsening shortness of breath.  Dr. Benay Spice believes her cough may be secondary to her hepatomegaly pushing on her diaphragm.  She is here today for evaluation and a 1 week follow-up visit after completing her first cycle of chemotherapy.   MEDICAL HISTORY: Past Medical History:  Diagnosis Date  . Anemia    iron deficiency  . Gynecological examination    [facility] exams  . Heart murmur   . Hyperlipidemia    on simvastatin  . Hypertension     ALLERGIES:  has No Known Allergies.  MEDICATIONS:  Current Outpatient Medications  Medication Sig Dispense Refill  . amLODipine (NORVASC) 5 MG tablet Take 1 tablet (5 mg total) by mouth daily. 90 tablet 3  . ascorbic acid (VITAMIN C) 500 MG tablet Take 500 mg by mouth daily.    Marland Kitchen atenolol (TENORMIN) 50 MG tablet TAKE ONE TABLET BY MOUTH EACH DAY (Patient taking differently: Take 50 mg by mouth daily. ) 90 tablet 3  . Calcium Carbonate-Vitamin D (CALCIUM 600+D PO) Take 1 tablet by mouth daily.    Marland Kitchen  hydrochlorothiazide (HYDRODIURIL) 25 MG tablet Take 1 tablet (25 mg total) by mouth daily. 90 tablet 3  . HYDROcodone-homatropine (HYDROMET) 5-1.5 MG/5ML syrup Take 5 mLs by mouth at bedtime as needed for cough. 120 mL 0  . lidocaine-prilocaine (EMLA) cream Apply 1 application topically as needed. (Patient taking differently: Apply 1 application topically as needed (port acess). ) 30 g 2  . oxyCODONE (ROXICODONE) 5 MG immediate release tablet Take 1 tablet (5 mg total) by mouth every 6 (six) hours as needed for severe pain. 30 tablet 0  . potassium  chloride (KLOR-CON) 10 MEQ tablet Take 1 tablet by mouth  daily (Patient taking differently: Take 10 mEq by mouth daily. ) 90 tablet 3  . prochlorperazine (COMPAZINE) 10 MG tablet Take 1 tablet (10 mg total) by mouth every 6 (six) hours as needed for nausea or vomiting. 30 tablet 2  . simvastatin (ZOCOR) 40 MG tablet Take 1 tablet (40 mg total) by mouth at bedtime. 90 tablet 3   Current Facility-Administered Medications  Medication Dose Route Frequency Provider Last Rate Last Admin  . 0.9 %  sodium chloride infusion  500 mL Intravenous Once Milus Banister, MD        SURGICAL HISTORY:  Past Surgical History:  Procedure Laterality Date  . c section    . COLONOSCOPY  09-16-14   per Dr. Ardis Hughs, adenomatous polyp, repeat in 3 months   . HEMORROIDECTOMY    . IR IMAGING GUIDED PORT INSERTION  10/13/2019  . IR US GUIDE BX ASP/DRAIN  10/13/2019  . POLYPECTOMY      REVIEW OF SYSTEMS:   Review of Systems  Constitutional: Positive for fatigue and decreased appetite. Negative for  chills,  fever and unexpected weight change.  HENT: Positive for mild self limiting nosebleed. Negative for mouth sores, , sore throat and trouble swallowing.   Eyes: Negative for eye problems and icterus.  Respiratory: Positive for cough at night. Negative for hemoptysis, shortness of breath and wheezing.   Cardiovascular: Negative for chest pain and leg swelling.  Gastrointestinal: Positive for abdominal pain (improved from prior) and occasional constipation. Negative for diarrhea, nausea and vomiting.  Genitourinary: Negative for bladder incontinence, difficulty urinating, dysuria, frequency and hematuria.   Musculoskeletal: Negative for back pain, gait problem, neck pain and neck stiffness.  Skin: Negative for itching and rash.  Neurological: Negative for dizziness, extremity weakness, gait problem, headaches, light-headedness and seizures.  Hematological: Negative for adenopathy. Does not bruise/bleed easily.   Psychiatric/Behavioral: Negative for confusion, depression and sleep disturbance. The patient is not nervous/anxious.     PHYSICAL EXAMINATION:  Blood pressure (!) 143/89, pulse 87, temperature 97.8 F (36.6 C), temperature source Temporal, resp. rate 18, height 5' 2.5" (1.588 m), weight 180 lb 1.6 oz (81.7 kg), SpO2 100 %.  ECOG PERFORMANCE STATUS: 1 - Symptomatic but completely ambulatory  Physical Exam  Constitutional: Oriented to person, place, and time and well-developed, well-nourished, and in no distress.   HENT:  Head: Normocephalic and atraumatic.  Mouth/Throat: Oropharynx is clear and moist. No oropharyngeal exudate.  Eyes: Conjunctivae are normal. Right eye exhibits no discharge. Left eye exhibits no discharge. No scleral icterus.  Neck: Normal range of motion. Neck supple.  Cardiovascular: Normal rate, regular rhythm, normal heart sounds and intact distal pulses.   Pulmonary/Chest: Effort normal and breath sounds normal. No respiratory distress. No wheezes. No rales.  Abdominal: Soft. Hepatomegaly. Mild discomfort with palpation over right and left upper quadrant Bowel sounds are normal. Exhibits no distension.  Musculoskeletal: Normal range of motion. Exhibits no edema.  Lymphadenopathy:    No cervical adenopathy.  Neurological: Alert and oriented to person, place, and time. Exhibits normal muscle tone. Gait normal. Coordination normal.  Skin: Skin is warm and dry. No rash noted. Not diaphoretic. No erythema. No pallor.  Psychiatric: Mood, memory and judgment normal.  Vitals reviewed.  LABORATORY DATA: Lab Results  Component Value Date   WBC 9.4 10/12/2019   HGB 11.1 (L) 10/12/2019   HCT 36.1 10/12/2019   MCV 76.2 (L) 10/12/2019   PLT 602 (H) 10/12/2019      Chemistry      Component Value Date/Time   NA 129 (L) 10/12/2019 1522   K 4.0 10/12/2019 1522   CL 93 (L) 10/12/2019 1522   CO2 25 10/12/2019 1522   BUN 11 10/12/2019 1522   CREATININE 0.73 10/12/2019  1522      Component Value Date/Time   CALCIUM 8.7 (L) 10/12/2019 1522   ALKPHOS 910 (H) 10/12/2019 1522   AST 102 (H) 10/12/2019 1522   ALT 37 10/12/2019 1522   BILITOT 1.2 10/12/2019 1522       RADIOGRAPHIC STUDIES:  CT CHEST W CONTRAST  Result Date: 10/08/2019 CLINICAL DATA:  Newly diagnosed cecal adenocarcinoma. Abdominal bloating, fatigue, and abdominal pain along with shortness of breath over the last year. Staging workup. EXAM: CT CHEST, ABDOMEN, AND PELVIS WITH CONTRAST TECHNIQUE: Multidetector CT imaging of the chest, abdomen and pelvis was performed following the standard protocol during bolus administration of intravenous contrast. CONTRAST:  116m OMNIPAQUE IOHEXOL 300 MG/ML  SOLN COMPARISON:  Chest radiograph 08/20/2019 FINDINGS: CT CHEST FINDINGS Cardiovascular: Unremarkable Mediastinum/Nodes: 1.1 by 0.6 cm right thyroid lobe nodule on image 5/2. No followup recommended (ref: J Am Coll Radiol. 2015 Feb;12(2): 143-50). Prevascular node 1.1 cm in short axis on image 16/2. Right lower paratracheal node 0.7 cm in short axis on image 21/2. Right hilar lymph node 0.7 cm in short axis, image 24/2. Left hilar lymph node 1.0 cm in short axis, image 25/2. Lungs/Pleura: A right middle lobe nodule measures 0.7 by 0.4 by 0.5 cm on image 72/3. 2 mm left upper lobe nodule on image 56/5. 2 by 3 mm left lower lobe nodule on image 64/3. Musculoskeletal: Thoracic spondylosis. CT ABDOMEN PELVIS FINDINGS Hepatobiliary: Unfortunately there is marked widespread metastatic disease throughout all lobes of the liver. Overall metastatic burden is heavy. The lesion posteriorly in the lateral segment left hepatic lobe measures 8.1 by 6.5 cm on image 56/2. Contracted gallbladder. No significant biliary dilatation. Mild perihepatic ascites. Pancreas: Unremarkable Spleen: Unremarkable Adrenals/Urinary Tract: 7 mm right kidney upper pole nonobstructive renal calculus. 1.3 by 1.0 cm hypodense lesion of the left kidney  upper pole is technically too small to characterize although probably a cyst. The urinary bladder appears unremarkable. Adrenal glands appear unremarkable. Stomach/Bowel: A cecal mass measuring about 7.3 by 4.4 by 4.8 cm is present. This is in the vicinity of the ileocecal valve. There is abnormal distension of wall thickening of the appendix with appendiceal diameter 1.3 cm on image 95/2. Right paracolic and ileocolic adenopathy with a node measuring 1.6 cm in short axis on image 90/2. This extends towards the central mesentery where there is a 1.3 cm mesenteric node on image 85/2. There is potentially local spread of tumor from the cecal mass into the adjacent pericecal tissues. Wall thickening in the terminal ileum noted. Mild sigmoid colon diverticulosis. Vascular/Lymphatic: Aortoiliac atherosclerotic vascular disease. Pathologic retroperitoneal adenopathy noted including an aortocaval node  measuring 1.4 cm in short axis on image 70/2. There is suspected adenopathy in the porta hepatis including a 1.8 cm porta hepatis node on image 65/2. Reproductive: Uterine fibroids noted. Other: There is a small amount of ascites around the cecal mass and extending into the pelvis. Malignant ascites is not excluded. Musculoskeletal: Lumbar spondylosis and degenerative disc disease. IMPRESSION: 1. Large cecal mass compatible with malignancy, with extensive hepatic metastatic disease; mild thoracic and abdominal adenopathy; adenopathy in the ileocecal region and extending to the central mesentery; and a small amount of ascites around the cecal mass and extending into the pelvis (early malignant ascites not excluded.). 2. Dilated and thickened appearing appendix possibly filled with debris or tumor, acute appendicitis not readily excluded although the appendix does not appear to be the epicenter of the regional edema. 3. Several small pulmonary nodules are technically nonspecific and merit surveillance. 4. Other imaging findings  of potential clinical significance: 7 mm right kidney upper pole nonobstructive renal calculus. Uterine fibroids. Mild sigmoid colon diverticulosis. Lumbar spondylosis and degenerative disc disease. 5. Aortic atherosclerosis. Aortic Atherosclerosis (ICD10-I70.0). Electronically Signed   By: Van Clines M.D.   On: 10/08/2019 15:12   CT ABDOMEN PELVIS W CONTRAST  Result Date: 10/08/2019 CLINICAL DATA:  Newly diagnosed cecal adenocarcinoma. Abdominal bloating, fatigue, and abdominal pain along with shortness of breath over the last year. Staging workup. EXAM: CT CHEST, ABDOMEN, AND PELVIS WITH CONTRAST TECHNIQUE: Multidetector CT imaging of the chest, abdomen and pelvis was performed following the standard protocol during bolus administration of intravenous contrast. CONTRAST:  160m OMNIPAQUE IOHEXOL 300 MG/ML  SOLN COMPARISON:  Chest radiograph 08/20/2019 FINDINGS: CT CHEST FINDINGS Cardiovascular: Unremarkable Mediastinum/Nodes: 1.1 by 0.6 cm right thyroid lobe nodule on image 5/2. No followup recommended (ref: J Am Coll Radiol. 2015 Feb;12(2): 143-50). Prevascular node 1.1 cm in short axis on image 16/2. Right lower paratracheal node 0.7 cm in short axis on image 21/2. Right hilar lymph node 0.7 cm in short axis, image 24/2. Left hilar lymph node 1.0 cm in short axis, image 25/2. Lungs/Pleura: A right middle lobe nodule measures 0.7 by 0.4 by 0.5 cm on image 72/3. 2 mm left upper lobe nodule on image 56/5. 2 by 3 mm left lower lobe nodule on image 64/3. Musculoskeletal: Thoracic spondylosis. CT ABDOMEN PELVIS FINDINGS Hepatobiliary: Unfortunately there is marked widespread metastatic disease throughout all lobes of the liver. Overall metastatic burden is heavy. The lesion posteriorly in the lateral segment left hepatic lobe measures 8.1 by 6.5 cm on image 56/2. Contracted gallbladder. No significant biliary dilatation. Mild perihepatic ascites. Pancreas: Unremarkable Spleen: Unremarkable Adrenals/Urinary  Tract: 7 mm right kidney upper pole nonobstructive renal calculus. 1.3 by 1.0 cm hypodense lesion of the left kidney upper pole is technically too small to characterize although probably a cyst. The urinary bladder appears unremarkable. Adrenal glands appear unremarkable. Stomach/Bowel: A cecal mass measuring about 7.3 by 4.4 by 4.8 cm is present. This is in the vicinity of the ileocecal valve. There is abnormal distension of wall thickening of the appendix with appendiceal diameter 1.3 cm on image 95/2. Right paracolic and ileocolic adenopathy with a node measuring 1.6 cm in short axis on image 90/2. This extends towards the central mesentery where there is a 1.3 cm mesenteric node on image 85/2. There is potentially local spread of tumor from the cecal mass into the adjacent pericecal tissues. Wall thickening in the terminal ileum noted. Mild sigmoid colon diverticulosis. Vascular/Lymphatic: Aortoiliac atherosclerotic vascular disease. Pathologic retroperitoneal adenopathy noted  including an aortocaval node measuring 1.4 cm in short axis on image 70/2. There is suspected adenopathy in the porta hepatis including a 1.8 cm porta hepatis node on image 65/2. Reproductive: Uterine fibroids noted. Other: There is a small amount of ascites around the cecal mass and extending into the pelvis. Malignant ascites is not excluded. Musculoskeletal: Lumbar spondylosis and degenerative disc disease. IMPRESSION: 1. Large cecal mass compatible with malignancy, with extensive hepatic metastatic disease; mild thoracic and abdominal adenopathy; adenopathy in the ileocecal region and extending to the central mesentery; and a small amount of ascites around the cecal mass and extending into the pelvis (early malignant ascites not excluded.). 2. Dilated and thickened appearing appendix possibly filled with debris or tumor, acute appendicitis not readily excluded although the appendix does not appear to be the epicenter of the regional  edema. 3. Several small pulmonary nodules are technically nonspecific and merit surveillance. 4. Other imaging findings of potential clinical significance: 7 mm right kidney upper pole nonobstructive renal calculus. Uterine fibroids. Mild sigmoid colon diverticulosis. Lumbar spondylosis and degenerative disc disease. 5. Aortic atherosclerosis. Aortic Atherosclerosis (ICD10-I70.0). Electronically Signed   By: Van Clines M.D.   On: 10/08/2019 15:12   IR US Guide Bx Asp/Drain  Result Date: 10/13/2019 CLINICAL DATA:  Cecal carcinoma with multiple confluent large masses throughout the liver consistent with metastatic disease. Biopsy of the liver has been requested to confirm metastatic disease. EXAM: ULTRASOUND GUIDED CORE BIOPSY OF LIVER MEDICATIONS: 1.0 mg IV Versed; 25 mcg IV Fentanyl Total Moderate Sedation Time: 10 minutes. The patient's level of consciousness and physiologic status were continuously monitored during the procedure by Radiology nursing. PROCEDURE: The procedure, risks, benefits, and alternatives were explained to the patient. Questions regarding the procedure were encouraged and answered. The patient understands and consents to the procedure. A time out was performed prior to initiating the procedure. Ultrasound was used to localize liver lesions. The abdominal wall was prepped with chlorhexidine in a sterile fashion, and a sterile drape was applied covering the operative field. A sterile gown and sterile gloves were used for the procedure. Local anesthesia was provided with 1% Lidocaine. Under ultrasound guidance, a 17 gauge trocar needle was advanced into the right lobe of the liver. Three separate coaxial 18 gauge core biopsy samples were obtained at the level of a lesion. Biopsy samples were submitted in formalin. Upon completion, a slurry of Gel-Foam pledgets was slowly injected as the outer needle was retracted and removed. Additional ultrasound was performed. COMPLICATIONS: None.  FINDINGS: The entire liver parenchyma is nearly completely replaced by large confluent lobulated masses. Samples were obtained in the lower aspect of the right lobe. Solid tissue was obtained. IMPRESSION: Ultrasound-guided core biopsy performed at the level of masses in the inferior aspect of the right lobe. The entire liver is nearly completely replaced by large confluent lobulated masses. Electronically Signed   By: Aletta Edouard M.D.   On: 10/13/2019 11:40   IR IMAGING GUIDED PORT INSERTION  Result Date: 10/13/2019 CLINICAL DATA:  Metastatic colorectal carcinoma and need for porta cath for chemotherapy. EXAM: IMPLANTED PORT A CATH PLACEMENT WITH ULTRASOUND AND FLUOROSCOPIC GUIDANCE ANESTHESIA/SEDATION: 1.0 mg IV Versed; 75 mcg IV Fentanyl Total Moderate Sedation Time:  36 minutes The patient's level of consciousness and physiologic status were continuously monitored during the procedure by Radiology nursing. Additional Medications: 2 g IV Ancef. FLUOROSCOPY TIME:  36 seconds.  4.7 mGy. PROCEDURE: The procedure, risks, benefits, and alternatives were explained to the patient. Questions regarding  the procedure were encouraged and answered. The patient understands and consents to the procedure. A time-out was performed prior to initiating the procedure. Ultrasound was utilized to confirm patency of the right internal jugular vein. The right neck and chest were prepped with chlorhexidine in a sterile fashion, and a sterile drape was applied covering the operative field. Maximum barrier sterile technique with sterile gowns and gloves were used for the procedure. Local anesthesia was provided with 1% lidocaine. After creating a small venotomy incision, a 21 gauge needle was advanced into the right internal jugular vein under direct, real-time ultrasound guidance. Ultrasound image documentation was performed. After securing guidewire access, an 8 Fr dilator was placed. A J-wire was kinked to measure appropriate  catheter length. A subcutaneous port pocket was then created along the upper chest wall utilizing sharp and blunt dissection. Portable cautery was utilized. The pocket was irrigated with sterile saline. A single lumen power injectable port was chosen for placement. The 8 Fr catheter was tunneled from the port pocket site to the venotomy incision. The port was placed in the pocket. External catheter was trimmed to appropriate length based on guidewire measurement. At the venotomy, an 8 Fr peel-away sheath was placed over a guidewire. The catheter was then placed through the sheath and the sheath removed. Final catheter positioning was confirmed and documented with a fluoroscopic spot image. The port was accessed with a needle and aspirated and flushed with heparinized saline. The access needle was removed. The venotomy and port pocket incisions were closed with subcutaneous 3-0 Monocryl and subcuticular 4-0 Vicryl. Dermabond was applied to both incisions. COMPLICATIONS: COMPLICATIONS None FINDINGS: After catheter placement, the tip lies at the cavo-atrial junction. The catheter aspirates normally and is ready for immediate use. IMPRESSION: Placement of single lumen port a cath via right internal jugular vein. The catheter tip lies at the cavo-atrial junction. A power injectable port a cath was placed and is ready for immediate use. Electronically Signed   By: Aletta Edouard M.D.   On: 10/13/2019 11:41     ASSESSMENT/PLAN:  This is a very pleasant 59 year old African-American female diagnosed with stage IV colorectal cancer, adenocarcinoma. She presented with a large cecal mass and extensive metastases to the liver,mild thoracic and abdominal adenopathy.Shehas several nonspecific pulmonary nodules which will be closely monitored. She was diagnosed in March 2021. Initial CEA 8,068. MMR normal. Foundation one pending.   She is currently undergoing treatment with FOLFOX IV every 2 weeks.  She is status post  1 cycle and tolerated it well without any concerning adverse side effects.  Her first dose was on 10/14/2019.   She will have a follow up visit with Dr. Benay Spice next week for evaluation before starting cycle #2.   I will send her a refill of her Hydromet for her cough as that works well for her.   Discussed constipation education. Discussed that she may use a stool softener such as Colace or Senokot to avoid constipation.  Discussed that these medications are over-the-counter.  Advised her that she should stop taking stool softeners if she develops diarrhea.  She was encouraged to drink plenty of fluids, eat fruits and vegetables, and be as active as possible to reduce the risk of constipation. If despite taking stool softeners she still does not have a bowel movement for several days, discussed that she may take one of the following laxatives such as MiraLAX, milk of magnesia, or magnesium citrate.  She will continue to be followed by nutrition for  her rapid weight loss prior to her recent diagnosis.   The patient was advised to call immediately if she has any concerning symptoms in the interval. The patient voices understanding of current disease status and treatment options and is in agreement with the current care plan. All questions were answered. The patient knows to call the clinic with any problems, questions or concerns. We can certainly see the patient much sooner if necessary   No orders of the defined types were placed in this encounter.    Adeena Bernabe L Daeshawn Redmann, PA-C 10/21/19

## 2019-10-20 NOTE — Research (Signed)
[employer] 2018-01 STUDY; BLOOD SAMPLE COLLECTION TO EVALUATE BIOMARKERS IN SUBJECTS WITH UNTREATED SOLID TUMORS.   SPECIMEN COLLECTION: Research blood specimens were collected via peripheral blood draw.  Blood sample kit # [id number] was used for this blood collection. Patient tolerated well without any complaints or adverse effects noted.

## 2019-10-21 ENCOUNTER — Encounter: Payer: Self-pay | Admitting: Physician Assistant

## 2019-10-21 ENCOUNTER — Inpatient Hospital Stay (HOSPITAL_BASED_OUTPATIENT_CLINIC_OR_DEPARTMENT_OTHER): Payer: No Typology Code available for payment source | Admitting: Physician Assistant

## 2019-10-21 ENCOUNTER — Other Ambulatory Visit: Payer: Self-pay

## 2019-10-21 VITALS — BP 143/89 | HR 87 | Temp 97.8°F | Resp 18 | Ht 62.5 in | Wt 180.1 lb

## 2019-10-21 DIAGNOSIS — C19 Malignant neoplasm of rectosigmoid junction: Secondary | ICD-10-CM | POA: Diagnosis not present

## 2019-10-21 DIAGNOSIS — R05 Cough: Secondary | ICD-10-CM

## 2019-10-21 DIAGNOSIS — R059 Cough, unspecified: Secondary | ICD-10-CM

## 2019-10-21 DIAGNOSIS — Z5111 Encounter for antineoplastic chemotherapy: Secondary | ICD-10-CM | POA: Diagnosis not present

## 2019-10-21 MED ORDER — HYDROCODONE-HOMATROPINE 5-1.5 MG/5ML PO SYRP: 5.0000 mL | ORAL_SOLUTION | Freq: Every evening | ORAL | 0 refills | Status: DC | PRN

## 2019-10-22 ENCOUNTER — Telehealth: Payer: Self-pay | Admitting: Physician Assistant

## 2019-10-22 NOTE — Progress Notes (Signed)
Pharmacist Chemotherapy Monitoring - Follow Up Assessment    I verify that I have reviewed each item in the below checklist:  . Regimen for the patient is scheduled for the appropriate day and plan matches scheduled date. Marland Kitchen Appropriate non-routine labs are ordered dependent on drug ordered. . If applicable, additional medications reviewed and ordered per protocol based on lifetime cumulative doses and/or treatment regimen.   Plan for follow-up and/or issues identified: No . I-vent associated with next due treatment: No . MD and/or nursing notified: No  Romualdo Bolk [facility] 10/22/2019 2:51 PM

## 2019-10-22 NOTE — Telephone Encounter (Signed)
Scheduled per los.. called and left msg about added appt. Mailed printout

## 2019-10-24 ENCOUNTER — Other Ambulatory Visit: Payer: Self-pay | Admitting: Oncology

## 2019-10-27 ENCOUNTER — Other Ambulatory Visit: Payer: Self-pay

## 2019-10-27 ENCOUNTER — Encounter (HOSPITAL_COMMUNITY): Payer: Self-pay | Admitting: Oncology

## 2019-10-27 ENCOUNTER — Inpatient Hospital Stay: Payer: No Typology Code available for payment source

## 2019-10-27 ENCOUNTER — Encounter (HOSPITAL_COMMUNITY): Payer: Self-pay | Admitting: Emergency Medicine

## 2019-10-27 ENCOUNTER — Inpatient Hospital Stay (HOSPITAL_BASED_OUTPATIENT_CLINIC_OR_DEPARTMENT_OTHER): Payer: No Typology Code available for payment source | Admitting: Oncology

## 2019-10-27 ENCOUNTER — Inpatient Hospital Stay: Payer: No Typology Code available for payment source | Admitting: Nutrition

## 2019-10-27 ENCOUNTER — Emergency Department (HOSPITAL_COMMUNITY)
Admission: EM | Admit: 2019-10-27 | Discharge: 2019-10-27 | Disposition: A | Discharge status: Home / Self Care | Payer: No Typology Code available for payment source | Attending: Emergency Medicine | Admitting: Emergency Medicine

## 2019-10-27 VITALS — BP 131/86 | HR 82 | Temp 98.0°F | Resp 16 | Ht 62.5 in | Wt 171.1 lb

## 2019-10-27 DIAGNOSIS — Z79899 Other long term (current) drug therapy: Secondary | ICD-10-CM | POA: Diagnosis not present

## 2019-10-27 DIAGNOSIS — C19 Malignant neoplasm of rectosigmoid junction: Secondary | ICD-10-CM

## 2019-10-27 DIAGNOSIS — Z9221 Personal history of antineoplastic chemotherapy: Secondary | ICD-10-CM | POA: Insufficient documentation

## 2019-10-27 DIAGNOSIS — Z451 Encounter for adjustment and management of infusion pump: Secondary | ICD-10-CM | POA: Diagnosis present

## 2019-10-27 DIAGNOSIS — D509 Iron deficiency anemia, unspecified: Secondary | ICD-10-CM

## 2019-10-27 DIAGNOSIS — Z85038 Personal history of other malignant neoplasm of large intestine: Secondary | ICD-10-CM | POA: Diagnosis not present

## 2019-10-27 DIAGNOSIS — I10 Essential (primary) hypertension: Secondary | ICD-10-CM | POA: Insufficient documentation

## 2019-10-27 LAB — CBC WITH DIFFERENTIAL (CANCER CENTER ONLY)
Abs Immature Granulocytes: 0.03 10*3/uL (ref 0.00–0.07)
Basophils Absolute: 0.1 10*3/uL (ref 0.0–0.1)
Basophils Relative: 1 %
Eosinophils Absolute: 0.1 10*3/uL (ref 0.0–0.5)
Eosinophils Relative: 1 %
HCT: 34.3 % — ABNORMAL LOW (ref 36.0–46.0)
Hemoglobin: 10.5 g/dL — ABNORMAL LOW (ref 12.0–15.0)
Immature Granulocytes: 0 %
Lymphocytes Relative: 25 %
Lymphs Abs: 1.7 10*3/uL (ref 0.7–4.0)
MCH: 24 pg — ABNORMAL LOW (ref 26.0–34.0)
MCHC: 30.6 g/dL (ref 30.0–36.0)
MCV: 78.3 fL — ABNORMAL LOW (ref 80.0–100.0)
Monocytes Absolute: 0.8 10*3/uL (ref 0.1–1.0)
Monocytes Relative: 12 %
Neutro Abs: 4.2 10*3/uL (ref 1.7–7.7)
Neutrophils Relative %: 61 %
Platelet Count: 414 10*3/uL — ABNORMAL HIGH (ref 150–400)
RBC: 4.38 MIL/uL (ref 3.87–5.11)
RDW: 22.5 % — ABNORMAL HIGH (ref 11.5–15.5)
WBC Count: 6.9 10*3/uL (ref 4.0–10.5)
nRBC: 0 % (ref 0.0–0.2)

## 2019-10-27 LAB — CMP (CANCER CENTER ONLY)
ALT: 29 U/L (ref 0–44)
AST: 88 U/L — ABNORMAL HIGH (ref 15–41)
Albumin: 2.3 g/dL — ABNORMAL LOW (ref 3.5–5.0)
Alkaline Phosphatase: 1185 U/L — ABNORMAL HIGH (ref 38–126)
Anion gap: 11 (ref 5–15)
BUN: 9 mg/dL (ref 6–20)
CO2: 27 mmol/L (ref 22–32)
Calcium: 8.3 mg/dL — ABNORMAL LOW (ref 8.9–10.3)
Chloride: 102 mmol/L (ref 98–111)
Creatinine: 0.66 mg/dL (ref 0.44–1.00)
GFR, Est AFR Am: >60 mL/min (ref >60)
GFR, Estimated: >60 mL/min (ref >60)
Glucose, Bld: 84 mg/dL (ref 70–99)
Potassium: 3.5 mmol/L (ref 3.5–5.1)
Sodium: 140 mmol/L (ref 135–145)
Total Bilirubin: 1.2 mg/dL (ref 0.3–1.2)
Total Protein: 6.9 g/dL (ref 6.5–8.1)

## 2019-10-27 MED ORDER — SODIUM CHLORIDE 0.9% FLUSH
10.0000 mL | INTRAVENOUS | Status: DC | PRN
  Filled 2019-10-27: qty 10

## 2019-10-27 MED ORDER — SODIUM CHLORIDE 0.9 % IV SOLN
10.0000 mg | Freq: Once | INTRAVENOUS | Status: AC
  Administered 2019-10-27: 13:00:00 10 mg via INTRAVENOUS
  Filled 2019-10-27: qty 10

## 2019-10-27 MED ORDER — SODIUM CHLORIDE 0.9% FLUSH
10.0000 mL | Freq: Once | INTRAVENOUS | Status: AC | PRN
  Administered 2019-10-27: 11:00:00 10 mL
  Filled 2019-10-27: qty 10

## 2019-10-27 MED ORDER — SODIUM CHLORIDE 0.9 % IV SOLN
1800.0000 mg/m2 | INTRAVENOUS | Status: DC
  Administered 2019-10-27: 16:00:00 3400 mg via INTRAVENOUS
  Filled 2019-10-27: qty 68

## 2019-10-27 MED ORDER — LEUCOVORIN CALCIUM INJECTION 350 MG
300.0000 mg/m2 | Freq: Once | INTRAVENOUS | Status: AC
  Administered 2019-10-27: 564 mg via INTRAVENOUS
  Filled 2019-10-27: qty 28.2

## 2019-10-27 MED ORDER — PALONOSETRON HCL INJECTION 0.25 MG/5ML
INTRAVENOUS | Status: AC
  Filled 2019-10-27: qty 5

## 2019-10-27 MED ORDER — DEXTROSE 5 % IV SOLN
Freq: Once | INTRAVENOUS | Status: AC
  Filled 2019-10-27: qty 250

## 2019-10-27 MED ORDER — PALONOSETRON HCL INJECTION 0.25 MG/5ML
0.2500 mg | Freq: Once | INTRAVENOUS | Status: AC
  Administered 2019-10-27: 13:00:00 0.25 mg via INTRAVENOUS

## 2019-10-27 MED ORDER — OXALIPLATIN CHEMO INJECTION 100 MG/20ML
85.0000 mg/m2 | Freq: Once | INTRAVENOUS | Status: AC
  Administered 2019-10-27: 160 mg via INTRAVENOUS
  Filled 2019-10-27: qty 32

## 2019-10-27 NOTE — ED Provider Notes (Signed)
[facility] EMERGENCY DEPARTMENT Provider Note   CSN: [id number] Arrival date & time: 10/27/19  1744     History Chief Complaint  Patient presents with  . Chemo bag leaking    Rachel Frank is a 60 y.o. female.  HPI Patient presents to the emergency department with concern for her chemotherapy bag leaking.  The patient states that she noticed that the back that was holding the chemotherapy drug was slightly wet.  She states that she called the cancer center and advised her to come here to have the area evaluated.  Patient states that her hands were tingling after touching the medication.  Past Medical History:  Diagnosis Date  . Anemia    iron deficiency  . Gynecological examination    [facility]  . Heart murmur   . Hyperlipidemia    on simvastatin  . Hypertension     Patient Active Problem List   Diagnosis Date Noted  . Colorectal cancer, stage IV ([place]) 10/11/2019  . Goals of care, counseling/discussion 10/11/2019  . Iron deficiency anemia 09/01/2019  . Adenomatous polyp of cecum 09/01/2019  . Anemia 08/20/2019  . Hyperlipidemia 03/27/2009  . Essential hypertension 01/12/2007  . HEART MURMUR, HX OF 01/12/2007    Past Surgical History:  Procedure Laterality Date  . c section    . COLONOSCOPY  09-16-14   per Dr. Ardis Hughs, adenomatous polyp, repeat in 3 months   . HEMORROIDECTOMY    . IR IMAGING GUIDED PORT INSERTION  10/13/2019  . IR US GUIDE BX ASP/DRAIN  10/13/2019  . POLYPECTOMY       OB History   No obstetric history on file.     Family History  Problem Relation Age of Onset  . Hypertension Other   . Cancer Other        lung, prostate  . Colon polyps Father   . Colon cancer Neg Hx   . Esophageal cancer Neg Hx   . Rectal cancer Neg Hx   . Stomach cancer Neg Hx     Social History   Tobacco Use  . Smoking status: Never Smoker  . Smokeless tobacco: Never Used  Substance Use Topics  . Alcohol use: No      Alcohol/week: 0.0 standard drinks  . Drug use: No    Home Medications Prior to Admission medications   Medication Sig Start Date End Date Taking? Authorizing Provider  amLODipine (NORVASC) 5 MG tablet Take 1 tablet (5 mg total) by mouth daily. 08/20/19   [name], MD  ascorbic acid (VITAMIN C) 500 MG tablet Take 500 mg by mouth daily.    [provider]  atenolol (TENORMIN) 50 MG tablet TAKE ONE TABLET BY MOUTH EACH DAY Patient taking differently: Take 50 mg by mouth daily.  08/20/19   [name], MD  Calcium Carbonate-Vitamin D (CALCIUM 600+D PO) Take 1 tablet by mouth daily.    [provider]  hydrochlorothiazide (HYDRODIURIL) 25 MG tablet Take 1 tablet (25 mg total) by mouth daily. 08/20/19   [name], MD  HYDROcodone-homatropine (HYDROMET) 5-1.5 MG/5ML syrup Take 5 mLs by mouth at bedtime as needed for cough. 10/21/19   Heilingoetter, Cassandra L, PA-C  lidocaine-prilocaine (EMLA) cream Apply 1 application topically as needed. Patient taking differently: Apply 1 application topically as needed (port acess).  10/11/19   Heilingoetter, Cassandra L, PA-C  oxyCODONE (ROXICODONE) 5 MG immediate release tablet Take 1 tablet (5 mg total) by  mouth every 6 (six) hours as needed for severe pain. Patient not taking: Reported on 10/27/2019 10/11/19   Heilingoetter, Cassandra L, PA-C  potassium chloride (KLOR-CON) 10 MEQ tablet Take 1 tablet by mouth  daily Patient taking differently: Take 10 mEq by mouth daily.  08/20/19   [name], MD  prochlorperazine (COMPAZINE) 10 MG tablet Take 1 tablet (10 mg total) by mouth every 6 (six) hours as needed for nausea or vomiting. 10/11/19   Heilingoetter, Cassandra L, PA-C  simvastatin (ZOCOR) 40 MG tablet Take 1 tablet (40 mg total) by mouth at bedtime. 08/20/19   [name], MD    Allergies    Patient has no known allergies.  Review of Systems   Review of Systems All other systems negative except as documented in the HPI.  All pertinent positives and negatives as reviewed in the HPI. Physical Exam Updated Vital Signs BP (!) 155/99   Pulse 98   Temp 98 F (36.7 C) (Oral)   Resp 16   Ht 5' 2.5" (1.588 m)   Wt 77.6 kg   SpO2 99%   BMI 30.78 kg/m   Physical Exam Vitals and nursing note reviewed.  Constitutional:      General: She is not in acute distress.    Appearance: She is well-developed.  HENT:     Head: Normocephalic and atraumatic.  Eyes:     Pupils: Pupils are equal, round, and reactive to light.  Pulmonary:     Effort: Pulmonary effort is normal.  Skin:    General: Skin is warm and dry.  Neurological:     Mental Status: She is alert and oriented to person, place, and time.     ED Results / Procedures / Treatments   Labs (all labs ordered are listed, but only abnormal results are displayed) Labs Reviewed - No data to display  EKG None  Radiology No results found.  Procedures Procedures (including critical care time)  Medications Ordered in ED Medications - No data to display  ED Course  I have reviewed the triage vital signs and the nursing notes.  Pertinent labs & imaging results that were available during my care of the patient were reviewed by me and considered in my medical decision making (see chart for details).    MDM Rules/Calculators/A&P                      Myself and the nurse thoroughly evaluated the apparatus and device along with a chemotherapy drug bag and there was no leaks or medicine draining from anywhere.  I feel that this most likely occurred when the hooked up the medication back to the pump device.  The back is not currently leaking or showing any signs of damage. Final Clinical Impression(s) / ED Diagnoses Final diagnoses:  None    Rx / DC Orders ED Discharge Orders    None       Dalia Heading, Hershal Coria 10/27/19 1916    Dorie Rank, MD 10/29/19 1551

## 2019-10-27 NOTE — Progress Notes (Signed)
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 Weight decreased and documented as 171.1 pounds on April 14 decreased from 180.1 pound April 8. This weight loss could be attributed to an improvement in her leg edema. Patient reports that she is eating well and her appetite is better. She is drinking about 2 bottles of oral nutrition supplements daily. She denies nutrition impact symptoms.  Nutrition diagnosis: Severe malnutrition has improved.  Intervention: Educated patient to continue strategies for increased calories and protein. Continue 2 bottles of oral nutrition supplements daily. Provided samples and coupons. Questions answered.  Monitoring, evaluation, goals: Patient will continue to tolerate adequate calories and protein to maintain lean body mass.  Next visit: Thursday, April 29 during infusion.  **Disclaimer: This note was dictated with voice recognition software. Similar sounding words can inadvertently be transcribed and this note may contain transcription errors which may not have been corrected upon publication of note.**

## 2019-10-27 NOTE — Discharge Instructions (Addendum)
Return here as needed.  I would follow-up with the cancer center for reevaluation of this device.

## 2019-10-27 NOTE — Patient Instructions (Signed)
[facility] Discharge Instructions for Patients Receiving Chemotherapy  Today you received the following chemotherapy agents Oxaliplatin, irinotecan, 5FU  To help prevent nausea and vomiting after your treatment, we encourage you to take your nausea medication as directed   If you develop nausea and vomiting that is not controlled by your nausea medication, call the clinic.   BELOW ARE SYMPTOMS THAT SHOULD BE REPORTED IMMEDIATELY:  *FEVER GREATER THAN 100.5 F  *CHILLS WITH OR WITHOUT FEVER  NAUSEA AND VOMITING THAT IS NOT CONTROLLED WITH YOUR NAUSEA MEDICATION  *UNUSUAL SHORTNESS OF BREATH  *UNUSUAL BRUISING OR BLEEDING  TENDERNESS IN MOUTH AND THROAT WITH OR WITHOUT PRESENCE OF ULCERS  *URINARY PROBLEMS  *BOWEL PROBLEMS  UNUSUAL RASH Items with * indicate a potential emergency and should be followed up as soon as possible.  Feel free to call the clinic should you have any questions or concerns. The clinic phone number is [phone].  Please show the [facility] at check-in to the Emergency Department and triage nurse.

## 2019-10-27 NOTE — ED Triage Notes (Signed)
Pt was seen at [facility] cancer center today, pt has a port that is accessed and she gets continuous chemo through, states the medication bag is leaking.

## 2019-10-27 NOTE — Progress Notes (Signed)
Per Dr. Benay Spice: OK to treat with AST 88 and alk phos [id number]

## 2019-10-27 NOTE — Progress Notes (Signed)
  [facility] OFFICE PROGRESS NOTE   Diagnosis: Colon cancer  INTERVAL HISTORY:   [name] completed cycle 1 FOLFOX on 10/14/2019.  No nausea/vomiting, mouth sores, diarrhea, or neuropathy symptoms.  She reports an improved appetite.  Abdominal pain has improved.  She is no longer taking pain medication.  Leg edema has improved.  Objective:  Vital signs in last 24 hours:  Blood pressure 131/86, pulse 82, temperature 98 F (36.7 C), temperature source Temporal, resp. rate 16, height 5' 2.5" (1.588 m), weight 171 lb 1.6 oz (77.6 kg), SpO2 100 %.   Resp: Lungs clear bilaterally Cardio: Regular rate and rhythm GI: The liver is palpable in the right and left abdomen, no apparent ascites Vascular: Trace pedal edema bilaterally     Portacath/PICC-without erythema  Lab Results:  Lab Results  Component Value Date   WBC 6.9 10/27/2019   HGB 10.5 (L) 10/27/2019   HCT 34.3 (L) 10/27/2019   MCV 78.3 (L) 10/27/2019   PLT 414 (H) 10/27/2019   NEUTROABS 4.2 10/27/2019    CMP  Lab Results  Component Value Date   NA 140 10/27/2019   K 3.5 10/27/2019   CL 102 10/27/2019   CO2 27 10/27/2019   GLUCOSE 84 10/27/2019   BUN 9 10/27/2019   CREATININE 0.66 10/27/2019   CALCIUM 8.3 (L) 10/27/2019   PROT 6.9 10/27/2019   ALBUMIN 2.3 (L) 10/27/2019   AST 88 (H) 10/27/2019   ALT 29 10/27/2019   ALKPHOS 1,185 (H) 10/27/2019   BILITOT 1.2 10/27/2019   GFRNONAA >60 10/27/2019   GFRAA >60 10/27/2019     Medications: I have reviewed the patient's current medications.   Assessment/Plan:   1. Stage IV adenocarcinoma of the cecum. She presented with a large cecal mass, extensive hepatic metastatic disease; mild thoracic and abdominal adenopathy. She also has several small nonspecific pulmonary nodules. She was diagnosed in March 2021.   Colonoscopy 09/16/2019-fungating mass at the cecum, sessile polyps in the this sigmoid, transverse colon, and hepatic flexure, adenocarcinoma  involving cecum biopsy, tubular adenoma and sessile serrated polyps  CTs chest, abdomen, and pelvis 10/08/2019-extensive liver metastases, thoracic/abdominal adenopathy, cecal mass, dilated and thin thickened appendix, nonspecific pulmonary nodules  Ultrasound-guided liver biopsy 10/13/2019-adenocarcinoma consistent with a metastatic colorectal primary, MSS, tumor mutation burden 3, KRAS Q61,  Cycle 1 FOLFOX 10/14/2019  Cycle 2 FOLFOX 10/27/2019 2) Iron deficiency anemia secondary to #1. 3.  Pain secondary to #1    Disposition: Rachel Frank has completed 1 cycle of FOLFOX.  She tolerated the chemotherapy well.  Her performance status has improved.  She will complete cycle 2 chemotherapy today.  I reviewed results of [facility] 1 testing.  She does not appear to be a candidate for targeted therapy or immunotherapy at present.  Rachel Frank will return for an office visit in the next cycle of chemotherapy in 2 weeks.  I encouraged her to obtain the COVID-19 vaccine.  Betsy Coder, MD  10/27/2019  12:03 PM

## 2019-10-29 ENCOUNTER — Inpatient Hospital Stay: Payer: No Typology Code available for payment source

## 2019-10-29 ENCOUNTER — Other Ambulatory Visit: Payer: Self-pay

## 2019-10-29 VITALS — BP 145/82 | HR 73 | Temp 98.0°F | Resp 18

## 2019-10-29 DIAGNOSIS — Z79899 Other long term (current) drug therapy: Secondary | ICD-10-CM | POA: Diagnosis not present

## 2019-10-29 DIAGNOSIS — G893 Neoplasm related pain (acute) (chronic): Secondary | ICD-10-CM | POA: Diagnosis not present

## 2019-10-29 DIAGNOSIS — E785 Hyperlipidemia, unspecified: Secondary | ICD-10-CM | POA: Diagnosis not present

## 2019-10-29 DIAGNOSIS — R918 Other nonspecific abnormal finding of lung field: Secondary | ICD-10-CM | POA: Diagnosis not present

## 2019-10-29 DIAGNOSIS — I10 Essential (primary) hypertension: Secondary | ICD-10-CM | POA: Diagnosis not present

## 2019-10-29 DIAGNOSIS — C180 Malignant neoplasm of cecum: Secondary | ICD-10-CM | POA: Diagnosis present

## 2019-10-29 DIAGNOSIS — D509 Iron deficiency anemia, unspecified: Secondary | ICD-10-CM | POA: Diagnosis not present

## 2019-10-29 DIAGNOSIS — R05 Cough: Secondary | ICD-10-CM | POA: Diagnosis not present

## 2019-10-29 DIAGNOSIS — C19 Malignant neoplasm of rectosigmoid junction: Secondary | ICD-10-CM

## 2019-10-29 DIAGNOSIS — C787 Secondary malignant neoplasm of liver and intrahepatic bile duct: Secondary | ICD-10-CM | POA: Diagnosis present

## 2019-10-29 DIAGNOSIS — Z5111 Encounter for antineoplastic chemotherapy: Secondary | ICD-10-CM | POA: Diagnosis present

## 2019-10-29 DIAGNOSIS — K5900 Constipation, unspecified: Secondary | ICD-10-CM | POA: Diagnosis not present

## 2019-10-29 MED ORDER — HEPARIN SOD (PORK) LOCK FLUSH 100 UNIT/ML IV SOLN
500.0000 [IU] | Freq: Once | INTRAVENOUS | Status: AC | PRN
  Administered 2019-10-29: 500 [IU]
  Filled 2019-10-29: qty 5

## 2019-10-29 MED ORDER — SODIUM CHLORIDE 0.9% FLUSH
10.0000 mL | INTRAVENOUS | Status: DC | PRN
  Administered 2019-10-29: 14:00:00 10 mL
  Filled 2019-10-29: qty 10

## 2019-10-30 ENCOUNTER — Ambulatory Visit: Payer: No Typology Code available for payment source | Attending: Internal Medicine

## 2019-10-30 DIAGNOSIS — Z23 Encounter for immunization: Secondary | ICD-10-CM

## 2019-10-30 NOTE — Progress Notes (Signed)
   Covid-19 Vaccination Clinic  Name:  Malanii Pruneau    MRN: [id number] DOB: July 11, 1961  10/30/2019  Ms. Celli was observed post Covid-19 immunization for 15 minutes without incident. She was provided with Vaccine Information Sheet and instruction to access the V-Safe system.   Ms. Smithhart was instructed to call 911 with any severe reactions post vaccine: Marland Kitchen Difficulty breathing  . Swelling of face and throat  . A fast heartbeat  . A bad rash all over body  . Dizziness and weakness   Immunizations Administered    Name Date Dose VIS Date Route   Pfizer COVID-19 Vaccine 10/30/2019 11:09 AM 0.3 mL 06/25/2019 Intramuscular   Manufacturer: [employer]   Lot: [id number]   [place]: [id number]

## 2019-11-03 ENCOUNTER — Other Ambulatory Visit: Payer: Self-pay | Admitting: Oncology

## 2019-11-05 NOTE — Progress Notes (Signed)
Pharmacist Chemotherapy Monitoring - Follow Up Assessment    I verify that I have reviewed each item in the below checklist:  . Regimen for the patient is scheduled for the appropriate day and plan matches scheduled date. Marland Kitchen Appropriate non-routine labs are ordered dependent on drug ordered. . If applicable, additional medications reviewed and ordered per protocol based on lifetime cumulative doses and/or treatment regimen.   Plan for follow-up and/or issues identified: No . I-vent associated with next due treatment: No . MD and/or nursing notified: No  Rachel Frank D 11/05/2019 1:34 PM

## 2019-11-08 ENCOUNTER — Other Ambulatory Visit: Payer: Self-pay | Admitting: Physician Assistant

## 2019-11-08 ENCOUNTER — Other Ambulatory Visit: Payer: Self-pay | Admitting: Oncology

## 2019-11-08 ENCOUNTER — Telehealth: Payer: Self-pay | Admitting: Nutrition

## 2019-11-08 ENCOUNTER — Telehealth: Payer: Self-pay | Admitting: Medical Oncology

## 2019-11-08 DIAGNOSIS — G893 Neoplasm related pain (acute) (chronic): Secondary | ICD-10-CM

## 2019-11-08 DIAGNOSIS — C19 Malignant neoplasm of rectosigmoid junction: Secondary | ICD-10-CM

## 2019-11-08 MED ORDER — OXYCODONE HCL 5 MG PO TABS: 5.0000 mg | ORAL_TABLET | Freq: Four times a day (QID) | ORAL | 0 refills | Status: DC | PRN

## 2019-11-08 NOTE — Telephone Encounter (Signed)
Patient's husband called wanting information on diet during nausea and vomiting. Patient has been vomiting but is taking nausea medication as prescribed.  He reports patient has done better today and has kept some soup down.  Provided some brief education on encouraging adequate fluid intake starting with clear liquids advancing to full liquids and then soft solids as tolerated.  Encouraged him to monitor her for dehydration and contact MD with any concerns.

## 2019-11-08 NOTE — Telephone Encounter (Signed)
Refll requested. LVM to call back with name of medication requested.

## 2019-11-11 ENCOUNTER — Inpatient Hospital Stay: Payer: No Typology Code available for payment source | Admitting: Nutrition

## 2019-11-11 ENCOUNTER — Other Ambulatory Visit: Payer: Self-pay

## 2019-11-11 ENCOUNTER — Inpatient Hospital Stay: Payer: No Typology Code available for payment source

## 2019-11-11 ENCOUNTER — Inpatient Hospital Stay (HOSPITAL_BASED_OUTPATIENT_CLINIC_OR_DEPARTMENT_OTHER): Payer: No Typology Code available for payment source | Admitting: Oncology

## 2019-11-11 VITALS — BP 146/83 | HR 88 | Temp 98.2°F | Resp 17 | Ht 62.5 in | Wt 163.1 lb

## 2019-11-11 DIAGNOSIS — I10 Essential (primary) hypertension: Secondary | ICD-10-CM | POA: Diagnosis not present

## 2019-11-11 DIAGNOSIS — D509 Iron deficiency anemia, unspecified: Secondary | ICD-10-CM

## 2019-11-11 DIAGNOSIS — Z5111 Encounter for antineoplastic chemotherapy: Secondary | ICD-10-CM | POA: Diagnosis present

## 2019-11-11 DIAGNOSIS — R918 Other nonspecific abnormal finding of lung field: Secondary | ICD-10-CM | POA: Diagnosis not present

## 2019-11-11 DIAGNOSIS — G893 Neoplasm related pain (acute) (chronic): Secondary | ICD-10-CM | POA: Diagnosis not present

## 2019-11-11 DIAGNOSIS — C787 Secondary malignant neoplasm of liver and intrahepatic bile duct: Secondary | ICD-10-CM | POA: Diagnosis present

## 2019-11-11 DIAGNOSIS — C180 Malignant neoplasm of cecum: Secondary | ICD-10-CM | POA: Diagnosis present

## 2019-11-11 DIAGNOSIS — C19 Malignant neoplasm of rectosigmoid junction: Secondary | ICD-10-CM

## 2019-11-11 DIAGNOSIS — Z79899 Other long term (current) drug therapy: Secondary | ICD-10-CM | POA: Diagnosis not present

## 2019-11-11 DIAGNOSIS — R05 Cough: Secondary | ICD-10-CM | POA: Diagnosis not present

## 2019-11-11 DIAGNOSIS — K5900 Constipation, unspecified: Secondary | ICD-10-CM | POA: Diagnosis not present

## 2019-11-11 DIAGNOSIS — E785 Hyperlipidemia, unspecified: Secondary | ICD-10-CM | POA: Diagnosis not present

## 2019-11-11 LAB — CBC WITH DIFFERENTIAL (CANCER CENTER ONLY)
Abs Immature Granulocytes: 0.02 10*3/uL (ref 0.00–0.07)
Basophils Absolute: 0 10*3/uL (ref 0.0–0.1)
Basophils Relative: 0 %
Eosinophils Absolute: 0 10*3/uL (ref 0.0–0.5)
Eosinophils Relative: 0 %
HCT: 33.6 % — ABNORMAL LOW (ref 36.0–46.0)
Hemoglobin: 10.5 g/dL — ABNORMAL LOW (ref 12.0–15.0)
Immature Granulocytes: 0 %
Lymphocytes Relative: 23 %
Lymphs Abs: 1.3 10*3/uL (ref 0.7–4.0)
MCH: 24.2 pg — ABNORMAL LOW (ref 26.0–34.0)
MCHC: 31.3 g/dL (ref 30.0–36.0)
MCV: 77.4 fL — ABNORMAL LOW (ref 80.0–100.0)
Monocytes Absolute: 0.6 10*3/uL (ref 0.1–1.0)
Monocytes Relative: 11 %
Neutro Abs: 3.6 10*3/uL (ref 1.7–7.7)
Neutrophils Relative %: 66 %
Platelet Count: 310 10*3/uL (ref 150–400)
RBC: 4.34 MIL/uL (ref 3.87–5.11)
RDW: 19.9 % — ABNORMAL HIGH (ref 11.5–15.5)
WBC Count: 5.6 10*3/uL (ref 4.0–10.5)
nRBC: 0 % (ref 0.0–0.2)

## 2019-11-11 LAB — CMP (CANCER CENTER ONLY)
ALT: 18 U/L (ref 0–44)
AST: 44 U/L — ABNORMAL HIGH (ref 15–41)
Albumin: 2.3 g/dL — ABNORMAL LOW (ref 3.5–5.0)
Alkaline Phosphatase: 603 U/L — ABNORMAL HIGH (ref 38–126)
Anion gap: 9 (ref 5–15)
BUN: 9 mg/dL (ref 6–20)
CO2: 26 mmol/L (ref 22–32)
Calcium: 8.2 mg/dL — ABNORMAL LOW (ref 8.9–10.3)
Chloride: 99 mmol/L (ref 98–111)
Creatinine: 0.64 mg/dL (ref 0.44–1.00)
GFR, Est AFR Am: >60 mL/min (ref >60)
GFR, Estimated: >60 mL/min (ref >60)
Glucose, Bld: 133 mg/dL — ABNORMAL HIGH (ref 70–99)
Potassium: 3.2 mmol/L — ABNORMAL LOW (ref 3.5–5.1)
Sodium: 134 mmol/L — ABNORMAL LOW (ref 135–145)
Total Bilirubin: 0.6 mg/dL (ref 0.3–1.2)
Total Protein: 6.4 g/dL — ABNORMAL LOW (ref 6.5–8.1)

## 2019-11-11 LAB — MAGNESIUM: Magnesium: 1.5 mg/dL — ABNORMAL LOW (ref 1.7–2.4)

## 2019-11-11 LAB — CEA (IN HOUSE-CHCC): CEA (CHCC-In House): 11843.63 ng/mL — ABNORMAL HIGH (ref 0.00–5.00)

## 2019-11-11 MED ORDER — PALONOSETRON HCL INJECTION 0.25 MG/5ML
0.2500 mg | Freq: Once | INTRAVENOUS | Status: AC
  Administered 2019-11-11: 0.25 mg via INTRAVENOUS

## 2019-11-11 MED ORDER — HEPARIN SOD (PORK) LOCK FLUSH 100 UNIT/ML IV SOLN
500.0000 [IU] | Freq: Once | INTRAVENOUS | Status: DC | PRN
  Filled 2019-11-11: qty 5

## 2019-11-11 MED ORDER — SODIUM CHLORIDE 0.9 % IV SOLN
10.0000 mg | Freq: Once | INTRAVENOUS | Status: AC
  Administered 2019-11-11: 10 mg via INTRAVENOUS
  Filled 2019-11-11: qty 10

## 2019-11-11 MED ORDER — DEXTROSE 5 % IV SOLN
Freq: Once | INTRAVENOUS | Status: AC
  Filled 2019-11-11: qty 250

## 2019-11-11 MED ORDER — SODIUM CHLORIDE 0.9% FLUSH
10.0000 mL | INTRAVENOUS | Status: DC | PRN
  Filled 2019-11-11: qty 10

## 2019-11-11 MED ORDER — DEXTROSE 5 % IV SOLN
Freq: Once | INTRAVENOUS | Status: DC
  Filled 2019-11-11: qty 250

## 2019-11-11 MED ORDER — DEXAMETHASONE 4 MG PO TABS
4.0000 mg | ORAL_TABLET | ORAL | 1 refills | Status: DC
Start: 2019-11-11 — End: 2020-02-17

## 2019-11-11 MED ORDER — SODIUM CHLORIDE 0.9 % IV SOLN
150.0000 mg | Freq: Once | INTRAVENOUS | Status: AC
  Administered 2019-11-11: 150 mg via INTRAVENOUS
  Filled 2019-11-11: qty 150

## 2019-11-11 MED ORDER — ONDANSETRON HCL 8 MG PO TABS
8.0000 mg | ORAL_TABLET | Freq: Three times a day (TID) | ORAL | 1 refills | Status: AC | PRN
Start: 2019-11-11 — End: ?

## 2019-11-11 MED ORDER — LEUCOVORIN CALCIUM INJECTION 350 MG
300.0000 mg/m2 | Freq: Once | INTRAVENOUS | Status: AC
  Administered 2019-11-11: 544 mg via INTRAVENOUS
  Filled 2019-11-11: qty 27.2

## 2019-11-11 MED ORDER — PALONOSETRON HCL INJECTION 0.25 MG/5ML
INTRAVENOUS | Status: AC
  Filled 2019-11-11: qty 5

## 2019-11-11 MED ORDER — SODIUM CHLORIDE 0.9% FLUSH
10.0000 mL | Freq: Once | INTRAVENOUS | Status: AC | PRN
  Administered 2019-11-11: 10 mL
  Filled 2019-11-11: qty 10

## 2019-11-11 MED ORDER — SODIUM CHLORIDE 0.9 % IV SOLN
2000.0000 mg/m2 | INTRAVENOUS | Status: DC
  Administered 2019-11-11: 3600 mg via INTRAVENOUS
  Filled 2019-11-11: qty 72

## 2019-11-11 MED ORDER — OXALIPLATIN CHEMO INJECTION 100 MG/20ML
84.0000 mg/m2 | Freq: Once | INTRAVENOUS | Status: AC
  Administered 2019-11-11: 150 mg via INTRAVENOUS
  Filled 2019-11-11: qty 30

## 2019-11-11 NOTE — Progress Notes (Signed)
Per Dr. Stevphen Meuse to treat w/K+ 3.2--will increase KCL to 10 meq bid at home.

## 2019-11-11 NOTE — Patient Instructions (Signed)
For nausea control:  Dexamethasone 4 mg twice daily x 2 days after each chemo treatment starting on day 2  Compazine 10 mg every 6 hours as needed  Zofran 8 mg every 8 hours as needed for nausea (start 72 hours after chemo tx)  Increase your potassium to 10 meq twice daily.

## 2019-11-11 NOTE — Progress Notes (Signed)
[facility] OFFICE PROGRESS NOTE   Diagnosis: Colon cancer  INTERVAL HISTORY:   Rachel Frank completed cycle 2 FOLFOX on 10/27/2019.  She developed nausea and vomiting beginning on day 3 and this lasted for several days.  She had prolonged cold sensitivity.  No peripheral numbness at present.  No diarrhea or mouth sores.  Abdominal pain persists, but has improved.  She feels much better compared to prechemotherapy.  Objective:  Vital signs in last 24 hours:  Blood pressure (!) 146/83, pulse 88, temperature 98.2 F (36.8 C), temperature source Temporal, resp. rate 17, height 5' 2.5" (1.588 m), weight 163 lb 1.6 oz (74 kg), SpO2 100 %.    HEENT: No thrush or ulcers Resp: Lungs clear bilaterally Cardio: Regular rate and rhythm GI: The liver is palpable throughout the upper abdomen Vascular: No leg edema Neuro: Very mild loss of vibratory sense at the fingertips bilaterally   Portacath/PICC-without erythema  Lab Results:  Lab Results  Component Value Date   WBC 5.6 11/11/2019   HGB 10.5 (L) 11/11/2019   HCT 33.6 (L) 11/11/2019   MCV 77.4 (L) 11/11/2019   PLT 310 11/11/2019   NEUTROABS 3.6 11/11/2019    CMP  Lab Results  Component Value Date   NA 134 (L) 11/11/2019   K 3.2 (L) 11/11/2019   CL 99 11/11/2019   CO2 26 11/11/2019   GLUCOSE 133 (H) 11/11/2019   BUN 9 11/11/2019   CREATININE 0.64 11/11/2019   CALCIUM 8.2 (L) 11/11/2019   PROT 6.4 (L) 11/11/2019   ALBUMIN 2.3 (L) 11/11/2019   AST 44 (H) 11/11/2019   ALT 18 11/11/2019   ALKPHOS 603 (H) 11/11/2019   BILITOT 0.6 11/11/2019   GFRNONAA >60 11/11/2019   GFRAA >60 11/11/2019    Medications: I have reviewed the patient's current medications.   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 She presented with a large cecal mass, extensive hepatic metastatic disease; mild thoracic and abdominal adenopathy. She also has several small nonspecific pulmonary nodules. She was diagnosed in  March 2021.   Colonoscopy 09/16/2019-fungating mass at the cecum, sessile polyps in the this sigmoid, transverse colon, and hepatic flexure, adenocarcinoma involving cecum biopsy, tubular adenoma and sessile serrated polyps  CTs chest, abdomen, and pelvis 10/08/2019-extensive liver metastases, thoracic/abdominal adenopathy, cecal mass, dilated and thin thickened appendix, nonspecific pulmonary nodules  Ultrasound-guided liver biopsy 10/13/2019-adenocarcinoma consistent with a metastatic colorectal primary, MSS, tumor mutation burden 3, KRAS Q61,  Cycle 1 FOLFOX 10/14/2019  Cycle 2 FOLFOX 10/27/2019  Cycle 3 FOLFOX 11/11/2019, Emend and Decadron added, 5-FU dose escalated 2) Iron deficiency anemia secondary to #1. 3.  Pain secondary to #1-improved 4.  Delayed nausea following cycle 1 FOLFOX, Emend and home Decadron added with cycle 2    Disposition: Rachel Frank has completed 2 cycles of FOLFOX.  Her overall clinical status has improved.  She will complete cycle 3 FOLFOX today.  We will follow up on the CEA from today.  She had delayed nausea and vomiting following cycle 2.  Emend and Decadron prophylaxis will be added with cycle 3.  She will let us know if she has significant nausea following this cycle.  The 5-FU infusion will be dose escalated since the liver enzymes and bilirubin have improved.  The potassium is low secondary to HCTZ, vomiting, and oxaliplatin.  She will increase the potassium supplement.  We will check a magnesium level.  Rachel Frank will return for an office visit and chemotherapy in 2  weeks.  Betsy Coder, MD  11/11/2019  11:20 AM

## 2019-11-11 NOTE — Patient Instructions (Signed)
[facility] Discharge Instructions for Patients Receiving Chemotherapy  Today you received the following chemotherapy agents: Oxaliplatin, Leucovorin, and Fluorouracil  To help prevent nausea and vomiting after your treatment, we encourage you to take your nausea medication as directed by your MD.   If you develop nausea and vomiting that is not controlled by your nausea medication, call the clinic.   BELOW ARE SYMPTOMS THAT SHOULD BE REPORTED IMMEDIATELY:  *FEVER GREATER THAN 100.5 F  *CHILLS WITH OR WITHOUT FEVER  NAUSEA AND VOMITING THAT IS NOT CONTROLLED WITH YOUR NAUSEA MEDICATION  *UNUSUAL SHORTNESS OF BREATH  *UNUSUAL BRUISING OR BLEEDING  TENDERNESS IN MOUTH AND THROAT WITH OR WITHOUT PRESENCE OF ULCERS  *URINARY PROBLEMS  *BOWEL PROBLEMS  UNUSUAL RASH Items with * indicate a potential emergency and should be followed up as soon as possible.  Feel free to call the clinic should you have any questions or concerns. The clinic phone number is [phone].  Please show the [place] at check-in to the Emergency Department and triage nurse.  Coronavirus (COVID-19) Are you at risk?  Are you at risk for the Coronavirus (COVID-19)?  To be considered HIGH RISK for Coronavirus (COVID-19), you have to meet the following criteria:  . Traveled to [place], [place], [place], [place] or [place]; or in the [place] to [place], [place], [place], or [place]; and have fever, cough, and shortness of breath within the last 2 weeks of travel OR . Been in close contact with a person diagnosed with COVID-19 within the last 2 weeks and have fever, cough, and shortness of breath . IF YOU DO NOT MEET THESE CRITERIA, YOU ARE CONSIDERED LOW RISK FOR COVID-19.  What to do if you are HIGH RISK for COVID-19?  Marland Kitchen If you are having a medical emergency, call 911. . Seek medical care right away. Before you go to a doctor's office, urgent care or emergency  department, call ahead and tell them about your recent travel, contact with someone diagnosed with COVID-19, and your symptoms. You should receive instructions from your physician's office regarding next steps of care.  . When you arrive at healthcare provider, tell the healthcare staff immediately you have returned from visiting [place], [place], [place], [place] or [place]; or traveled in the [place] to [place], [place], [place], or [place]; in the last two weeks or you have been in close contact with a person diagnosed with COVID-19 in the last 2 weeks.   . Tell the health care staff about your symptoms: fever, cough and shortness of breath. . After you have been seen by a medical provider, you will be either: o Tested for (COVID-19) and discharged home on quarantine except to seek medical care if symptoms worsen, and asked to  - Stay home and avoid contact with others until you get your results (4-5 days)  - Avoid travel on public transportation if possible (such as bus, train, or airplane) or o Sent to the Emergency Department by EMS for evaluation, COVID-19 testing, and possible admission depending on your condition and test results.  What to do if you are LOW RISK for COVID-19?  Reduce your risk of any infection by using the same precautions used for avoiding the common cold or flu:  Marland Kitchen Wash your hands often with soap and warm water for at least 20 seconds.  If soap and water are not readily available, use an alcohol-based hand sanitizer with at least 60% alcohol.  Marland Kitchen  If coughing or sneezing, cover your mouth and nose by coughing or sneezing into the elbow areas of your shirt or coat, into a tissue or into your sleeve (not your hands). . Avoid shaking hands with others and consider head nods or verbal greetings only. . Avoid touching your eyes, nose, or mouth with unwashed hands.  . Avoid close contact with people who are sick. . Avoid places or events with large numbers of people  in one location, like concerts or sporting events. . Carefully consider travel plans you have or are making. . If you are planning any travel outside or inside the [place], visit the CDC's Travelers' Health webpage for the latest health notices. . If you have some symptoms but not all symptoms, continue to monitor at home and seek medical attention if your symptoms worsen. . If you are having a medical emergency, call 911.   [place] / e-Visit: [email or web address]         [facility]: [facility]: [phone]                   [facility]: [phone]

## 2019-11-11 NOTE — Progress Notes (Signed)
Nutrition follow-up completed with patient during infusion for colon cancer. Weight decreased and documented as 163.1 pounds April 29 down from 171.1 pounds April 14. Patient reports she experienced extreme nausea after her last treatment. Reports she started to feel better this morning and is now here for another chemotherapy treatment. She is trying to drink 2-3 bottles of boost or Ensure a day. Labs were reviewed.  Nutrition diagnosis: Severe malnutrition continues.  Intervention: Educated patient on strategies for improving nausea and vomiting. Encourage small frequent meals and snacks. Provided fact sheet on nausea and vomiting. Gave patient coupons.  Monitoring, evaluation, goals: Patient will tolerate increased protein and calories to maintain lean body mass.  Next visit: To be scheduled as needed.  **Disclaimer: This note was dictated with voice recognition software. Similar sounding words can inadvertently be transcribed and this note may contain transcription errors which may not have been corrected upon publication of note.**

## 2019-11-12 ENCOUNTER — Telehealth: Payer: Self-pay | Admitting: Oncology

## 2019-11-12 NOTE — Telephone Encounter (Signed)
Scheduled per los. Called and left msg. Mailed printotu  

## 2019-11-13 ENCOUNTER — Inpatient Hospital Stay: Payer: 59 | Attending: Physician Assistant

## 2019-11-13 ENCOUNTER — Inpatient Hospital Stay: Payer: 59

## 2019-11-13 ENCOUNTER — Other Ambulatory Visit: Payer: Self-pay

## 2019-11-13 VITALS — BP 133/68 | HR 67 | Temp 98.4°F | Resp 20

## 2019-11-13 DIAGNOSIS — R160 Hepatomegaly, not elsewhere classified: Secondary | ICD-10-CM | POA: Diagnosis not present

## 2019-11-13 DIAGNOSIS — R590 Localized enlarged lymph nodes: Secondary | ICD-10-CM | POA: Insufficient documentation

## 2019-11-13 DIAGNOSIS — Z5111 Encounter for antineoplastic chemotherapy: Secondary | ICD-10-CM | POA: Diagnosis present

## 2019-11-13 DIAGNOSIS — D509 Iron deficiency anemia, unspecified: Secondary | ICD-10-CM | POA: Diagnosis not present

## 2019-11-13 DIAGNOSIS — R222 Localized swelling, mass and lump, trunk: Secondary | ICD-10-CM | POA: Diagnosis not present

## 2019-11-13 DIAGNOSIS — C19 Malignant neoplasm of rectosigmoid junction: Secondary | ICD-10-CM

## 2019-11-13 DIAGNOSIS — R110 Nausea: Secondary | ICD-10-CM | POA: Diagnosis not present

## 2019-11-13 DIAGNOSIS — D125 Benign neoplasm of sigmoid colon: Secondary | ICD-10-CM | POA: Diagnosis not present

## 2019-11-13 DIAGNOSIS — Z5189 Encounter for other specified aftercare: Secondary | ICD-10-CM | POA: Insufficient documentation

## 2019-11-13 DIAGNOSIS — C787 Secondary malignant neoplasm of liver and intrahepatic bile duct: Secondary | ICD-10-CM | POA: Insufficient documentation

## 2019-11-13 DIAGNOSIS — G893 Neoplasm related pain (acute) (chronic): Secondary | ICD-10-CM | POA: Insufficient documentation

## 2019-11-13 DIAGNOSIS — R918 Other nonspecific abnormal finding of lung field: Secondary | ICD-10-CM | POA: Insufficient documentation

## 2019-11-13 DIAGNOSIS — D123 Benign neoplasm of transverse colon: Secondary | ICD-10-CM | POA: Diagnosis not present

## 2019-11-13 DIAGNOSIS — C180 Malignant neoplasm of cecum: Secondary | ICD-10-CM | POA: Diagnosis present

## 2019-11-13 MED ORDER — MAGNESIUM SULFATE 2 GM/50ML IV SOLN
2.0000 g | Freq: Once | INTRAVENOUS | Status: AC
  Administered 2019-11-13: 2 g via INTRAVENOUS

## 2019-11-13 MED ORDER — MAGNESIUM SULFATE 2 GM/50ML IV SOLN
INTRAVENOUS | Status: AC
  Filled 2019-11-13: qty 50

## 2019-11-13 MED ORDER — HEPARIN SOD (PORK) LOCK FLUSH 100 UNIT/ML IV SOLN
500.0000 [IU] | Freq: Once | INTRAVENOUS | Status: AC | PRN
  Administered 2019-11-13: 500 [IU]
  Filled 2019-11-13: qty 5

## 2019-11-13 MED ORDER — SODIUM CHLORIDE 0.9% FLUSH
10.0000 mL | INTRAVENOUS | Status: DC | PRN
  Administered 2019-11-13: 10 mL
  Filled 2019-11-13: qty 10

## 2019-11-13 NOTE — Patient Instructions (Signed)
Hypomagnesemia Hypomagnesemia is a condition in which the level of magnesium in the blood is low. Magnesium is a mineral that is found in many foods. It is used in many different processes in the body. Hypomagnesemia can affect every organ in the body. In severe cases, it can cause life-threatening problems. What are the causes? This condition may be caused by:  Not getting enough magnesium in your diet.  Malnutrition.  Problems with absorbing magnesium from the intestines.  Dehydration.  Alcohol abuse.  Vomiting.  Severe or chronic diarrhea.  Some medicines, including medicines that make you urinate more (diuretics).  Certain diseases, such as kidney disease, diabetes, celiac disease, and overactive thyroid. What are the signs or symptoms? Symptoms of this condition include:  Loss of appetite.  Nausea and vomiting.  Involuntary shaking or trembling of a body part (tremor).  Muscle weakness.  Tingling in the arms and legs.  Sudden tightening of muscles (muscle spasms).  Confusion.  Psychiatric issues, such as depression, irritability, or psychosis.  A feeling of fluttering of the heart.  Seizures. These symptoms are more severe if magnesium levels drop suddenly. How is this diagnosed? This condition may be diagnosed based on:  Your symptoms and medical history.  A physical exam.  Blood and urine tests. How is this treated? Treatment depends on the cause and the severity of the condition. It may be treated with:  A magnesium supplement. This can be taken in pill form. If the condition is severe, magnesium is usually given through an IV.  Changes to your diet. You may be directed to eat foods that have a lot of magnesium, such as green leafy vegetables, peas, beans, and nuts.  Stopping any intake of alcohol. Follow these instructions at home:      Make sure that your diet includes foods with magnesium. Foods that have a lot of magnesium in them  include: ? Green leafy vegetables, such as spinach and broccoli. ? Beans and peas. ? Nuts and seeds, such as almonds and sunflower seeds. ? Whole grains, such as whole grain bread and fortified cereals.  Take magnesium supplements if your health care provider tells you to do that. Take them as directed.  Take over-the-counter and prescription medicines only as told by your health care provider.  Have your magnesium levels monitored as told by your health care provider.  When you are active, drink fluids that contain electrolytes.  Avoid drinking alcohol.  Keep all follow-up visits as told by your health care provider. This is important. Contact a health care provider if:  You get worse instead of better.  Your symptoms return. Get help right away if you:  Develop severe muscle weakness.  Have trouble breathing.  Feel that your heart is racing. Summary  Hypomagnesemia is a condition in which the level of magnesium in the blood is low.  Hypomagnesemia can affect every organ in the body.  Treatment may include eating more foods that contain magnesium, taking magnesium supplements, and not drinking alcohol.  Have your magnesium levels monitored as told by your health care provider. This information is not intended to replace advice given to you by your health care provider. Make sure you discuss any questions you have with your health care provider. Document Revised: 06/13/2017 Document Reviewed: 06/02/2017 Elsevier Patient Education  2020 Elsevier Inc.  

## 2019-11-19 NOTE — Progress Notes (Signed)
Pharmacist Chemotherapy Monitoring - Follow Up Assessment    I verify that I have reviewed each item in the below checklist:  . Regimen for the patient is scheduled for the appropriate day and plan matches scheduled date. Marland Kitchen Appropriate non-routine labs are ordered dependent on drug ordered. . If applicable, additional medications reviewed and ordered per protocol based on lifetime cumulative doses and/or treatment regimen.   Plan for follow-up and/or issues identified: No . I-vent associated with next due treatment: No . MD and/or nursing notified: No  Britt Boozer 11/19/2019 10:30 AM

## 2019-11-20 ENCOUNTER — Other Ambulatory Visit: Payer: Self-pay | Admitting: Oncology

## 2019-11-23 ENCOUNTER — Ambulatory Visit: Payer: No Typology Code available for payment source | Attending: Internal Medicine

## 2019-11-23 DIAGNOSIS — Z23 Encounter for immunization: Secondary | ICD-10-CM

## 2019-11-23 NOTE — Progress Notes (Signed)
   Covid-19 Vaccination Clinic  Name:  Rachel Frank    MRN: [id number] DOB: 08-01-1960  11/23/2019  [name] was observed post Covid-19 immunization for 15 minutes without incident. She was provided with Vaccine Information Sheet and instruction to access the V-Safe system.   [name] was instructed to call 911 with any severe reactions post vaccine: Marland Kitchen Difficulty breathing  . Swelling of face and throat  . A fast heartbeat  . A bad rash all over body  . Dizziness and weakness   Immunizations Administered    Name Date Dose VIS Date Route   Pfizer COVID-19 Vaccine 11/23/2019  3:29 PM 0.3 mL 09/08/2018 Intramuscular   Manufacturer: [place]   Lot: [id number]   [place]: [id number]

## 2019-11-25 ENCOUNTER — Inpatient Hospital Stay (HOSPITAL_BASED_OUTPATIENT_CLINIC_OR_DEPARTMENT_OTHER): Payer: 59 | Admitting: Nurse Practitioner

## 2019-11-25 ENCOUNTER — Inpatient Hospital Stay: Payer: 59 | Admitting: Nutrition

## 2019-11-25 ENCOUNTER — Other Ambulatory Visit: Payer: Self-pay

## 2019-11-25 ENCOUNTER — Other Ambulatory Visit: Payer: Self-pay | Admitting: Nurse Practitioner

## 2019-11-25 ENCOUNTER — Inpatient Hospital Stay: Payer: 59

## 2019-11-25 ENCOUNTER — Encounter: Payer: Self-pay | Admitting: Nurse Practitioner

## 2019-11-25 VITALS — BP 157/76 | HR 66 | Temp 98.0°F | Resp 18 | Wt 157.6 lb

## 2019-11-25 VITALS — BP 157/76 | HR 66 | Temp 98.0°F | Resp 18

## 2019-11-25 DIAGNOSIS — C19 Malignant neoplasm of rectosigmoid junction: Secondary | ICD-10-CM

## 2019-11-25 DIAGNOSIS — Z5111 Encounter for antineoplastic chemotherapy: Secondary | ICD-10-CM | POA: Diagnosis not present

## 2019-11-25 DIAGNOSIS — D509 Iron deficiency anemia, unspecified: Secondary | ICD-10-CM | POA: Diagnosis not present

## 2019-11-25 LAB — CBC WITH DIFFERENTIAL (CANCER CENTER ONLY)
Abs Immature Granulocytes: 0 10*3/uL (ref 0.00–0.07)
Basophils Absolute: 0 10*3/uL (ref 0.0–0.1)
Basophils Relative: 1 %
Eosinophils Absolute: 0.1 10*3/uL (ref 0.0–0.5)
Eosinophils Relative: 1 %
HCT: 33.7 % — ABNORMAL LOW (ref 36.0–46.0)
Hemoglobin: 10.6 g/dL — ABNORMAL LOW (ref 12.0–15.0)
Immature Granulocytes: 0 %
Lymphocytes Relative: 34 %
Lymphs Abs: 1.3 10*3/uL (ref 0.7–4.0)
MCH: 24.7 pg — ABNORMAL LOW (ref 26.0–34.0)
MCHC: 31.5 g/dL (ref 30.0–36.0)
MCV: 78.4 fL — ABNORMAL LOW (ref 80.0–100.0)
Monocytes Absolute: 0.5 10*3/uL (ref 0.1–1.0)
Monocytes Relative: 14 %
Neutro Abs: 1.9 10*3/uL (ref 1.7–7.7)
Neutrophils Relative %: 50 %
Platelet Count: 273 10*3/uL (ref 150–400)
RBC: 4.3 MIL/uL (ref 3.87–5.11)
RDW: 18.6 % — ABNORMAL HIGH (ref 11.5–15.5)
WBC Count: 3.8 10*3/uL — ABNORMAL LOW (ref 4.0–10.5)
nRBC: 0 % (ref 0.0–0.2)

## 2019-11-25 LAB — CMP (CANCER CENTER ONLY)
ALT: 19 U/L (ref 0–44)
AST: 27 U/L (ref 15–41)
Albumin: 2.7 g/dL — ABNORMAL LOW (ref 3.5–5.0)
Alkaline Phosphatase: 514 U/L — ABNORMAL HIGH (ref 38–126)
Anion gap: 11 (ref 5–15)
BUN: 7 mg/dL (ref 6–20)
CO2: 24 mmol/L (ref 22–32)
Calcium: 9 mg/dL (ref 8.9–10.3)
Chloride: 101 mmol/L (ref 98–111)
Creatinine: 0.59 mg/dL (ref 0.44–1.00)
GFR, Est AFR Am: >60 mL/min (ref >60)
GFR, Estimated: >60 mL/min (ref >60)
Glucose, Bld: 84 mg/dL (ref 70–99)
Potassium: 3.3 mmol/L — ABNORMAL LOW (ref 3.5–5.1)
Sodium: 136 mmol/L (ref 135–145)
Total Bilirubin: 0.6 mg/dL (ref 0.3–1.2)
Total Protein: 7.1 g/dL (ref 6.5–8.1)

## 2019-11-25 LAB — CEA (IN HOUSE-CHCC): CEA (CHCC-In House): 6724.52 ng/mL — ABNORMAL HIGH (ref 0.00–5.00)

## 2019-11-25 LAB — MAGNESIUM: Magnesium: 1.3 mg/dL — CL (ref 1.7–2.4)

## 2019-11-25 MED ORDER — ALTEPLASE 2 MG IJ SOLR
INTRAMUSCULAR | Status: AC
  Filled 2019-11-25: qty 2

## 2019-11-25 MED ORDER — MAGNESIUM SULFATE 2 GM/50ML IV SOLN
INTRAVENOUS | Status: AC
  Filled 2019-11-25: qty 50

## 2019-11-25 MED ORDER — SODIUM CHLORIDE 0.9% FLUSH
10.0000 mL | Freq: Once | INTRAVENOUS | Status: AC | PRN
  Administered 2019-11-25: 10 mL
  Filled 2019-11-25: qty 10

## 2019-11-25 MED ORDER — PALONOSETRON HCL INJECTION 0.25 MG/5ML
0.2500 mg | Freq: Once | INTRAVENOUS | Status: AC
  Administered 2019-11-25: 0.25 mg via INTRAVENOUS

## 2019-11-25 MED ORDER — POTASSIUM CHLORIDE CRYS ER 20 MEQ PO TBCR: EXTENDED_RELEASE_TABLET | ORAL | 0 refills | Status: DC

## 2019-11-25 MED ORDER — OXALIPLATIN CHEMO INJECTION 100 MG/20ML
84.0000 mg/m2 | Freq: Once | INTRAVENOUS | Status: AC
  Administered 2019-11-25: 150 mg via INTRAVENOUS
  Filled 2019-11-25: qty 30

## 2019-11-25 MED ORDER — LEUCOVORIN CALCIUM INJECTION 350 MG
300.0000 mg/m2 | Freq: Once | INTRAVENOUS | Status: AC
  Administered 2019-11-25: 544 mg via INTRAVENOUS
  Filled 2019-11-25: qty 27.2

## 2019-11-25 MED ORDER — SODIUM CHLORIDE 0.9 % IV SOLN
150.0000 mg | Freq: Once | INTRAVENOUS | Status: AC
  Administered 2019-11-25: 150 mg via INTRAVENOUS
  Filled 2019-11-25: qty 150

## 2019-11-25 MED ORDER — SODIUM CHLORIDE 0.9 % IV SOLN
2000.0000 mg/m2 | INTRAVENOUS | Status: DC
  Administered 2019-11-25: 3600 mg via INTRAVENOUS
  Filled 2019-11-25: qty 72

## 2019-11-25 MED ORDER — ALTEPLASE 2 MG IJ SOLR
2.0000 mg | Freq: Once | INTRAMUSCULAR | Status: AC | PRN
  Administered 2019-11-25: 2 mg
  Filled 2019-11-25: qty 2

## 2019-11-25 MED ORDER — PALONOSETRON HCL INJECTION 0.25 MG/5ML
INTRAVENOUS | Status: AC
  Filled 2019-11-25: qty 5

## 2019-11-25 MED ORDER — DEXTROSE 5 % IV SOLN
Freq: Once | INTRAVENOUS | Status: AC
  Filled 2019-11-25: qty 250

## 2019-11-25 MED ORDER — SODIUM CHLORIDE 0.9 % IV SOLN
10.0000 mg | Freq: Once | INTRAVENOUS | Status: AC
  Administered 2019-11-25: 10 mg via INTRAVENOUS
  Filled 2019-11-25: qty 10

## 2019-11-25 MED ORDER — MAGNESIUM SULFATE 2 GM/50ML IV SOLN
2.0000 g | Freq: Once | INTRAVENOUS | Status: AC
  Administered 2019-11-25: 2 g via INTRAVENOUS

## 2019-11-25 MED ORDER — MAGNESIUM OXIDE 400 (241.3 MG) MG PO TABS: 400.0000 mg | ORAL_TABLET | Freq: Two times a day (BID) | ORAL | 2 refills | Status: AC

## 2019-11-25 NOTE — Progress Notes (Signed)
  [employer] OFFICE PROGRESS NOTE   Diagnosis: Colon cancer  INTERVAL HISTORY:   Ms. Passey returns as scheduled.  She completed cycle 3 FOLFOX 11/11/2019.  She noted much improvement in the nausea.  She had 2 episodes of nausea/vomiting.  No mouth sores.  No diarrhea.  She has prolonged cold sensitivity.  No numbness or tingling in the absence of cold exposure.  Abdominal pain is better.  Appetite is better though she has lost some weight since the last visit.  Objective:  Vital signs in last 24 hours:  Blood pressure (!) 157/76, pulse 66, temperature 98 F (36.7 C), temperature source Temporal, resp. rate 18, weight 157 lb 9.6 oz (71.5 kg), SpO2 100 %.    HEENT: Mild white coating over tongue. Resp: Lungs clear bilaterally. Cardio: Regular rate and rhythm. GI: Liver palpable throughout the upper abdomen, no significant associated tenderness. Vascular: No leg edema. Neuro: Vibratory sense intact over the fingertips per tuning fork exam. Skin: Palms without erythema. Port-A-Cath without erythema.   Lab Results:  Lab Results  Component Value Date   WBC 3.8 (L) 11/25/2019   HGB 10.6 (L) 11/25/2019   HCT 33.7 (L) 11/25/2019   MCV 78.4 (L) 11/25/2019   PLT 273 11/25/2019   NEUTROABS 1.9 11/25/2019    Imaging:  No results found.  Medications: I have reviewed the patient's current medications.  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 She presented with a large cecal mass, extensive hepatic metastatic disease; mild thoracic and abdominal adenopathy. She also has several small nonspecific pulmonary nodules. She was diagnosed in March 2021.   Colonoscopy 09/16/2019-fungating mass at the cecum, sessile polyps in the this sigmoid, transverse colon, and hepatic flexure, adenocarcinoma involving cecum biopsy, tubular adenoma and sessile serrated polyps  CTs chest, abdomen, and pelvis 10/08/2019-extensive liver metastases, thoracic/abdominal  adenopathy, cecal mass, dilated and thin thickened appendix, nonspecific pulmonary nodules  Ultrasound-guided liver biopsy 10/13/2019-adenocarcinoma consistent with a metastatic colorectal primary, MSS, tumor mutation burden 3, KRAS Q61,  Cycle 1 FOLFOX 10/14/2019  Cycle 2 FOLFOX 10/27/2019  Cycle 3 FOLFOX 11/11/2019, Emend and Decadron added, 5-FU dose escalated  Cycle 4 FOLFOX 11/25/2019, Udenyca added 2) Iron deficiency anemia secondary to #1. 3.Pain secondary to #1-improved 4.  Delayed nausea following cycle 1 FOLFOX, Emend and home Decadron added with cycle 2-improved    Disposition: Ms. Mullinax appears stable.  She has completed 3 cycles of FOLFOX.  Performance status has improved.  Plan to proceed with cycle 4 today as scheduled.  We reviewed the CBC from today.  The absolute neutrophil count is in the low normal range.  She will receive Udenyca on the day of pump discontinuation.  We reviewed potential toxicities associated with Udenyca including bone pain, rash, rare splenic rupture.  She agrees to proceed.  She will return for lab, follow-up, cycle 5 FOLFOX in 2 weeks.  She will contact the office in the interim with any problems.  We specifically discussed fever, chills, other signs of infection.  Plan reviewed with Dr. Benay Spice.    Ned Card ANP/GNP-BC   11/25/2019  1:54 PM

## 2019-11-25 NOTE — Progress Notes (Signed)
Nutrition follow-up completed with patient during infusion for colon cancer. Weight decreased and documented as 157.6 pounds down from 163.1 pounds April 29. Patient does not understand why she is losing weight. Reports she is eating well. She drinks 1 bottle of oral nutrition supplements daily. Reports some nausea however it is controlled on nausea medication.  Denies vomiting. No problems with constipation or diarrhea.  Nutrition diagnosis: Severe malnutrition continues.  Intervention: Patient was educated on strategies for adding calories in smaller amounts throughout the day. She will continue to take nausea medications and control her diet choices as needed. Recommended patient increase oral nutrition supplements to 2-3 bottles daily. Questions were answered.  Teach back method used.  Patient has my contact information.  Monitoring, evaluation, goals: Patient will tolerate adequate calories and protein to minimize further weight loss.  Next visit: To be scheduled as needed with treatments.  Patient agrees to contact me for questions or concerns before then.

## 2019-11-25 NOTE — Progress Notes (Signed)
Patient was told to come at 1:45 or 2pm on Saturday, it is Ok to stop the pump with drug left in it.

## 2019-11-25 NOTE — Progress Notes (Addendum)
Per Lisa/Dr. Benay Spice, okay to start premeds while CMET is pending. Chemo will await CMET results.   For magnesium level of 1.3 - give magnesium 2 grams IV x1 in infusion today. Patient to start magnesium oxide 400mg  PO BID as home medication. For potassium level of 3.3, Rx for potassium chloride 20mg  sent to pharmacy by Lattie Haw. Patient will take potassium chloride 20 mEq BID x 3 days, then 20 mEq daily thereafter. Patient was previously taking 19mEq/daily of potassium. Patient educated on changes and provided an updated med list.   Demetrius Charity, PharmD, [place], [facility] Pharmacist Pharmacy Phone: [phone] 11/25/2019

## 2019-11-25 NOTE — Patient Instructions (Signed)
[facility] Discharge Instructions for Patients Receiving Chemotherapy  Today you received the following chemotherapy agents: Oxaliplatin, Leucovorin, and Fluorouracil  To help prevent nausea and vomiting after your treatment, we encourage you to take your nausea medication as directed by your MD.   If you develop nausea and vomiting that is not controlled by your nausea medication, call the clinic.   BELOW ARE SYMPTOMS THAT SHOULD BE REPORTED IMMEDIATELY:  *FEVER GREATER THAN 100.5 F  *CHILLS WITH OR WITHOUT FEVER  NAUSEA AND VOMITING THAT IS NOT CONTROLLED WITH YOUR NAUSEA MEDICATION  *UNUSUAL SHORTNESS OF BREATH  *UNUSUAL BRUISING OR BLEEDING  TENDERNESS IN MOUTH AND THROAT WITH OR WITHOUT PRESENCE OF ULCERS  *URINARY PROBLEMS  *BOWEL PROBLEMS  UNUSUAL RASH Items with * indicate a potential emergency and should be followed up as soon as possible.  Feel free to call the clinic should you have any questions or concerns. The clinic phone number is [phone].  Please show the [place] at check-in to the Emergency Department and triage nurse.  Coronavirus (COVID-19) Are you at risk?  Are you at risk for the Coronavirus (COVID-19)?  To be considered HIGH RISK for Coronavirus (COVID-19), you have to meet the following criteria:  . Traveled to [place], [place], [place], [place] or [place]; or in the [place] to [place], [place], [place], or [place]; and have fever, cough, and shortness of breath within the last 2 weeks of travel OR . Been in close contact with a person diagnosed with COVID-19 within the last 2 weeks and have fever, cough, and shortness of breath . IF YOU DO NOT MEET THESE CRITERIA, YOU ARE CONSIDERED LOW RISK FOR COVID-19.  What to do if you are HIGH RISK for COVID-19?  Marland Kitchen If you are having a medical emergency, call 911. . Seek medical care right away. Before you go to a doctor's office, urgent care or emergency  department, call ahead and tell them about your recent travel, contact with someone diagnosed with COVID-19, and your symptoms. You should receive instructions from your physician's office regarding next steps of care.  . When you arrive at healthcare provider, tell the healthcare staff immediately you have returned from visiting [place], [place], [place], [place] or [place]; or traveled in the [place] to [place], [place], [place], or [place]; in the last two weeks or you have been in close contact with a person diagnosed with COVID-19 in the last 2 weeks.   . Tell the health care staff about your symptoms: fever, cough and shortness of breath. . After you have been seen by a medical provider, you will be either: o Tested for (COVID-19) and discharged home on quarantine except to seek medical care if symptoms worsen, and asked to  - Stay home and avoid contact with others until you get your results (4-5 days)  - Avoid travel on public transportation if possible (such as bus, train, or airplane) or o Sent to the Emergency Department by EMS for evaluation, COVID-19 testing, and possible admission depending on your condition and test results.  What to do if you are LOW RISK for COVID-19?  Reduce your risk of any infection by using the same precautions used for avoiding the common cold or flu:  Marland Kitchen Wash your hands often with soap and warm water for at least 20 seconds.  If soap and water are not readily available, use an alcohol-based hand sanitizer with at least 60% alcohol.  Marland Kitchen  If coughing or sneezing, cover your mouth and nose by coughing or sneezing into the elbow areas of your shirt or coat, into a tissue or into your sleeve (not your hands). . Avoid shaking hands with others and consider head nods or verbal greetings only. . Avoid touching your eyes, nose, or mouth with unwashed hands.  . Avoid close contact with people who are sick. . Avoid places or events with large numbers of people  in one location, like concerts or sporting events. . Carefully consider travel plans you have or are making. . If you are planning any travel outside or inside the [place], visit the CDC's Travelers' Health webpage for the latest health notices. . If you have some symptoms but not all symptoms, continue to monitor at home and seek medical attention if your symptoms worsen. . If you are having a medical emergency, call 911.   [place] / e-Visit: [email or web address]         [facility]: [facility]: [phone]                   [facility]: [phone]

## 2019-11-27 ENCOUNTER — Inpatient Hospital Stay: Payer: 59

## 2019-11-27 ENCOUNTER — Other Ambulatory Visit: Payer: Self-pay

## 2019-11-27 DIAGNOSIS — C19 Malignant neoplasm of rectosigmoid junction: Secondary | ICD-10-CM

## 2019-11-27 DIAGNOSIS — Z5111 Encounter for antineoplastic chemotherapy: Secondary | ICD-10-CM | POA: Diagnosis not present

## 2019-11-27 MED ORDER — SODIUM CHLORIDE 0.9% FLUSH
10.0000 mL | INTRAVENOUS | Status: DC | PRN
  Administered 2019-11-27: 10 mL
  Filled 2019-11-27: qty 10

## 2019-11-27 MED ORDER — HEPARIN SOD (PORK) LOCK FLUSH 100 UNIT/ML IV SOLN
500.0000 [IU] | Freq: Once | INTRAVENOUS | Status: AC | PRN
  Administered 2019-11-27: 500 [IU]
  Filled 2019-11-27: qty 5

## 2019-11-27 MED ORDER — PEGFILGRASTIM-CBQV 6 MG/0.6ML ~~LOC~~ SOSY
6.0000 mg | PREFILLED_SYRINGE | Freq: Once | SUBCUTANEOUS | Status: AC
  Administered 2019-11-27: 6 mg via SUBCUTANEOUS

## 2019-11-27 NOTE — Patient Instructions (Signed)

## 2019-12-01 ENCOUNTER — Telehealth: Payer: Self-pay | Admitting: Oncology

## 2019-12-01 NOTE — Telephone Encounter (Signed)
Scheduled per los. Called and spoke with patient. Confirmed appt 

## 2019-12-02 NOTE — Progress Notes (Signed)
Pharmacist Chemotherapy Monitoring - Follow Up Assessment    I verify that I have reviewed each item in the below checklist:  . Regimen for the patient is scheduled for the appropriate day and plan matches scheduled date. Marland Kitchen Appropriate non-routine labs are ordered dependent on drug ordered. . If applicable, additional medications reviewed and ordered per protocol based on lifetime cumulative doses and/or treatment regimen.   Plan for follow-up and/or issues identified: No . I-vent associated with next due treatment: No . MD and/or nursing notified: No  Britt Boozer 12/02/2019 11:35 AM

## 2019-12-05 ENCOUNTER — Other Ambulatory Visit: Payer: Self-pay | Admitting: Oncology

## 2019-12-08 ENCOUNTER — Inpatient Hospital Stay: Payer: 59

## 2019-12-08 ENCOUNTER — Other Ambulatory Visit: Payer: Self-pay

## 2019-12-08 ENCOUNTER — Telehealth: Payer: Self-pay | Admitting: Oncology

## 2019-12-08 ENCOUNTER — Inpatient Hospital Stay (HOSPITAL_BASED_OUTPATIENT_CLINIC_OR_DEPARTMENT_OTHER): Payer: 59 | Admitting: Oncology

## 2019-12-08 VITALS — BP 164/86 | HR 73 | Temp 98.0°F | Resp 14 | Wt 158.2 lb

## 2019-12-08 DIAGNOSIS — C19 Malignant neoplasm of rectosigmoid junction: Secondary | ICD-10-CM

## 2019-12-08 DIAGNOSIS — D509 Iron deficiency anemia, unspecified: Secondary | ICD-10-CM

## 2019-12-08 DIAGNOSIS — Z5111 Encounter for antineoplastic chemotherapy: Secondary | ICD-10-CM | POA: Diagnosis not present

## 2019-12-08 LAB — CMP (CANCER CENTER ONLY)
ALT: 19 U/L (ref 0–44)
AST: 23 U/L (ref 15–41)
Albumin: 2.8 g/dL — ABNORMAL LOW (ref 3.5–5.0)
Alkaline Phosphatase: 450 U/L — ABNORMAL HIGH (ref 38–126)
Anion gap: 10 (ref 5–15)
BUN: 8 mg/dL (ref 6–20)
CO2: 27 mmol/L (ref 22–32)
Calcium: 9.1 mg/dL (ref 8.9–10.3)
Chloride: 101 mmol/L (ref 98–111)
Creatinine: 0.84 mg/dL (ref 0.44–1.00)
GFR, Est AFR Am: >60 mL/min (ref >60)
GFR, Estimated: >60 mL/min (ref >60)
Glucose, Bld: 106 mg/dL — ABNORMAL HIGH (ref 70–99)
Potassium: 4 mmol/L (ref 3.5–5.1)
Sodium: 138 mmol/L (ref 135–145)
Total Bilirubin: 0.3 mg/dL (ref 0.3–1.2)
Total Protein: 7 g/dL (ref 6.5–8.1)

## 2019-12-08 LAB — CBC WITH DIFFERENTIAL (CANCER CENTER ONLY)
Abs Immature Granulocytes: 0.15 10*3/uL — ABNORMAL HIGH (ref 0.00–0.07)
Basophils Absolute: 0 10*3/uL (ref 0.0–0.1)
Basophils Relative: 1 %
Eosinophils Absolute: 0 10*3/uL (ref 0.0–0.5)
Eosinophils Relative: 1 %
HCT: 33.9 % — ABNORMAL LOW (ref 36.0–46.0)
Hemoglobin: 10.6 g/dL — ABNORMAL LOW (ref 12.0–15.0)
Immature Granulocytes: 2 %
Lymphocytes Relative: 23 %
Lymphs Abs: 1.8 10*3/uL (ref 0.7–4.0)
MCH: 24.7 pg — ABNORMAL LOW (ref 26.0–34.0)
MCHC: 31.3 g/dL (ref 30.0–36.0)
MCV: 79 fL — ABNORMAL LOW (ref 80.0–100.0)
Monocytes Absolute: 0.7 10*3/uL (ref 0.1–1.0)
Monocytes Relative: 9 %
Neutro Abs: 4.9 10*3/uL (ref 1.7–7.7)
Neutrophils Relative %: 64 %
Platelet Count: 211 10*3/uL (ref 150–400)
RBC: 4.29 MIL/uL (ref 3.87–5.11)
RDW: 19 % — ABNORMAL HIGH (ref 11.5–15.5)
WBC Count: 7.6 10*3/uL (ref 4.0–10.5)
nRBC: 0.3 % — ABNORMAL HIGH (ref 0.0–0.2)

## 2019-12-08 LAB — CEA (IN HOUSE-CHCC): CEA (CHCC-In House): 17464.44 ng/mL — ABNORMAL HIGH (ref 0.00–5.00)

## 2019-12-08 LAB — MAGNESIUM: Magnesium: 1.5 mg/dL — ABNORMAL LOW (ref 1.7–2.4)

## 2019-12-08 MED ORDER — LEUCOVORIN CALCIUM INJECTION 350 MG
300.0000 mg/m2 | Freq: Once | INTRAVENOUS | Status: AC
  Administered 2019-12-08: 544 mg via INTRAVENOUS
  Filled 2019-12-08: qty 27.2

## 2019-12-08 MED ORDER — DEXTROSE 5 % IV SOLN
INTRAVENOUS | Status: DC
  Filled 2019-12-08: qty 250

## 2019-12-08 MED ORDER — PALONOSETRON HCL INJECTION 0.25 MG/5ML
INTRAVENOUS | Status: AC
  Filled 2019-12-08: qty 5

## 2019-12-08 MED ORDER — HEPARIN SOD (PORK) LOCK FLUSH 100 UNIT/ML IV SOLN
500.0000 [IU] | Freq: Once | INTRAVENOUS | Status: DC | PRN
  Filled 2019-12-08: qty 5

## 2019-12-08 MED ORDER — PALONOSETRON HCL INJECTION 0.25 MG/5ML
0.2500 mg | Freq: Once | INTRAVENOUS | Status: AC
  Administered 2019-12-08: 0.25 mg via INTRAVENOUS

## 2019-12-08 MED ORDER — OXALIPLATIN CHEMO INJECTION 100 MG/20ML
84.0000 mg/m2 | Freq: Once | INTRAVENOUS | Status: AC
  Administered 2019-12-08: 150 mg via INTRAVENOUS
  Filled 2019-12-08: qty 30

## 2019-12-08 MED ORDER — SODIUM CHLORIDE 0.9 % IV SOLN
10.0000 mg | Freq: Once | INTRAVENOUS | Status: AC
  Administered 2019-12-08: 10 mg via INTRAVENOUS
  Filled 2019-12-08: qty 10

## 2019-12-08 MED ORDER — MAGNESIUM SULFATE 2 GM/50ML IV SOLN
2.0000 g | Freq: Once | INTRAVENOUS | Status: AC
  Administered 2019-12-08: 2 g via INTRAVENOUS

## 2019-12-08 MED ORDER — SODIUM CHLORIDE 0.9 % IV SOLN
2000.0000 mg/m2 | INTRAVENOUS | Status: DC
  Administered 2019-12-08: 3600 mg via INTRAVENOUS
  Filled 2019-12-08: qty 72

## 2019-12-08 MED ORDER — SODIUM CHLORIDE 0.9% FLUSH
10.0000 mL | Freq: Once | INTRAVENOUS | Status: AC | PRN
  Administered 2019-12-08: 10 mL
  Filled 2019-12-08: qty 10

## 2019-12-08 MED ORDER — MAGNESIUM SULFATE 2 GM/50ML IV SOLN
INTRAVENOUS | Status: AC
  Filled 2019-12-08: qty 50

## 2019-12-08 MED ORDER — DEXTROSE 5 % IV SOLN
Freq: Once | INTRAVENOUS | Status: AC
Start: 2019-12-08 — End: 2019-12-08
  Filled 2019-12-08: qty 250

## 2019-12-08 MED ORDER — SODIUM CHLORIDE 0.9 % IV SOLN
150.0000 mg | Freq: Once | INTRAVENOUS | Status: AC
  Administered 2019-12-08: 150 mg via INTRAVENOUS
  Filled 2019-12-08: qty 150

## 2019-12-08 MED ORDER — SODIUM CHLORIDE 0.9% FLUSH
10.0000 mL | INTRAVENOUS | Status: DC | PRN
  Filled 2019-12-08: qty 10

## 2019-12-08 NOTE — Telephone Encounter (Signed)
Scheduled per 5/26 los. Printed calendar for pt.

## 2019-12-08 NOTE — Patient Instructions (Signed)
[facility] Discharge Instructions for Patients Receiving Chemotherapy  Today you received the following chemotherapy agents: Oxaliplatin, Leucovorin, and Fluorouracil  To help prevent nausea and vomiting after your treatment, we encourage you to take your nausea medication as directed by your MD.   If you develop nausea and vomiting that is not controlled by your nausea medication, call the clinic.   BELOW ARE SYMPTOMS THAT SHOULD BE REPORTED IMMEDIATELY:  *FEVER GREATER THAN 100.5 F  *CHILLS WITH OR WITHOUT FEVER  NAUSEA AND VOMITING THAT IS NOT CONTROLLED WITH YOUR NAUSEA MEDICATION  *UNUSUAL SHORTNESS OF BREATH  *UNUSUAL BRUISING OR BLEEDING  TENDERNESS IN MOUTH AND THROAT WITH OR WITHOUT PRESENCE OF ULCERS  *URINARY PROBLEMS  *BOWEL PROBLEMS  UNUSUAL RASH Items with * indicate a potential emergency and should be followed up as soon as possible.  Feel free to call the clinic should you have any questions or concerns. The clinic phone number is [phone].  Please show the [place] at check-in to the Emergency Department and triage nurse.  Coronavirus (COVID-19) Are you at risk?  Are you at risk for the Coronavirus (COVID-19)?  To be considered HIGH RISK for Coronavirus (COVID-19), you have to meet the following criteria:  . Traveled to [place], [place], [place], [place] or [place]; or in the [place] to [place], [place], [place], or [place]; and have fever, cough, and shortness of breath within the last 2 weeks of travel OR . Been in close contact with a person diagnosed with COVID-19 within the last 2 weeks and have fever, cough, and shortness of breath . IF YOU DO NOT MEET THESE CRITERIA, YOU ARE CONSIDERED LOW RISK FOR COVID-19.  What to do if you are HIGH RISK for COVID-19?  Marland Kitchen If you are having a medical emergency, call 911. . Seek medical care right away. Before you go to a doctor's office, urgent care or emergency  department, call ahead and tell them about your recent travel, contact with someone diagnosed with COVID-19, and your symptoms. You should receive instructions from your physician's office regarding next steps of care.  . When you arrive at healthcare provider, tell the healthcare staff immediately you have returned from visiting [place], [place], [place], [place] or [place]; or traveled in the [place] to [place], [place], [place], or [place]; in the last two weeks or you have been in close contact with a person diagnosed with COVID-19 in the last 2 weeks.   . Tell the health care staff about your symptoms: fever, cough and shortness of breath. . After you have been seen by a medical provider, you will be either: o Tested for (COVID-19) and discharged home on quarantine except to seek medical care if symptoms worsen, and asked to  - Stay home and avoid contact with others until you get your results (4-5 days)  - Avoid travel on public transportation if possible (such as bus, train, or airplane) or o Sent to the Emergency Department by EMS for evaluation, COVID-19 testing, and possible admission depending on your condition and test results.  What to do if you are LOW RISK for COVID-19?  Reduce your risk of any infection by using the same precautions used for avoiding the common cold or flu:  Marland Kitchen Wash your hands often with soap and warm water for at least 20 seconds.  If soap and water are not readily available, use an alcohol-based hand sanitizer with at least 60% alcohol.  Marland Kitchen  If coughing or sneezing, cover your mouth and nose by coughing or sneezing into the elbow areas of your shirt or coat, into a tissue or into your sleeve (not your hands). . Avoid shaking hands with others and consider head nods or verbal greetings only. . Avoid touching your eyes, nose, or mouth with unwashed hands.  . Avoid close contact with people who are sick. . Avoid places or events with large numbers of people  in one location, like concerts or sporting events. . Carefully consider travel plans you have or are making. . If you are planning any travel outside or inside the [place], visit the CDC's Travelers' Health webpage for the latest health notices. . If you have some symptoms but not all symptoms, continue to monitor at home and seek medical attention if your symptoms worsen. . If you are having a medical emergency, call 911.   [place] / e-Visit: [email or web address]         [facility]: [facility]: [phone]                   [facility]: [phone]

## 2019-12-08 NOTE — Progress Notes (Signed)
  Rachel Frank OFFICE PROGRESS NOTE   Diagnosis: Colon cancer  INTERVAL HISTORY:   Rachel Frank completed another cycle of FOLFOX on 11/25/2019. She received Udenyca on 11/27/2019. No bone pain. No nausea/vomiting, mouth sores, or diarrhea. She has cold sensitivity at the tongue. She has altered taste. No peripheral neuropathy symptoms.  Objective:  Vital signs in last 24 hours:  Blood pressure (!) 164/86, pulse 73, temperature 98 F (36.7 C), temperature source Oral, resp. rate 14, weight 158 lb 3.2 oz (71.8 kg), SpO2 99 %.    HEENT: No thrush or ulcers Resp: Lungs clear bilaterally Cardio: Regular rate and rhythm GI: The liver edge is palpable in the right mid abdomen. Vascular: No leg edema Neuro: Very mild loss of vibratory sense at the fingertips bilaterally    Portacath/PICC-without erythema  Lab Results:  Lab Results  Component Value Date   WBC 7.6 12/08/2019   HGB 10.6 (L) 12/08/2019   HCT 33.9 (L) 12/08/2019   MCV 79.0 (L) 12/08/2019   PLT 211 12/08/2019   NEUTROABS 4.9 12/08/2019    CMP  Lab Results  Component Value Date   NA 136 11/25/2019   K 3.3 (L) 11/25/2019   CL 101 11/25/2019   CO2 24 11/25/2019   GLUCOSE 84 11/25/2019   BUN 7 11/25/2019   CREATININE 0.59 11/25/2019   CALCIUM 9.0 11/25/2019   PROT 7.1 11/25/2019   ALBUMIN 2.7 (L) 11/25/2019   AST 27 11/25/2019   ALT 19 11/25/2019   ALKPHOS 514 (H) 11/25/2019   BILITOT 0.6 11/25/2019   GFRNONAA >60 11/25/2019   GFRAA >60 11/25/2019    Lab Results  Component Value Date   CEA1 4,327.61 (H) 11/25/2019    Medications: I have reviewed the patient's current medications.   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 She presented with a large cecal mass, extensive hepatic metastatic disease; mild thoracic and abdominal adenopathy. She also has several small nonspecific pulmonary nodules. She was diagnosed in March 2021.   Colonoscopy 09/16/2019-fungating mass at  the cecum, sessile polyps in the this sigmoid, transverse colon, and hepatic flexure, adenocarcinoma involving cecum biopsy, tubular adenoma and sessile serrated polyps  CTs chest, abdomen, and pelvis 10/08/2019-extensive liver metastases, thoracic/abdominal adenopathy, cecal mass, dilated and thin thickened appendix, nonspecific pulmonary nodules  Ultrasound-guided liver biopsy 10/13/2019-adenocarcinoma consistent with a metastatic colorectal primary, MSS, tumor mutation burden 3, KRAS Q61,  Cycle 1 FOLFOX 10/14/2019  Cycle 2 FOLFOX 10/27/2019  Cycle 3 FOLFOX 11/11/2019, Emend and Decadron added, 5-FU dose escalated  Cycle 4 FOLFOX 11/25/2019, Udenyca added  Cycle 5 FOLFOX 12/08/2019 2) Iron deficiency anemia secondary to #1. 3.Pain secondary to #1-improved 4.  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 Her performance status has improved since beginning chemotherapy. She will complete cycle 5 FOLFOX today. We will follow up on the CEA from today.  Rachel Frank will return for an office visit and chemotherapy in 2 weeks. She will be scheduled for a restaging CT evaluation after cycle 6.  Betsy Coder, MD  12/08/2019  12:21 PM

## 2019-12-08 NOTE — Patient Instructions (Addendum)
Your magnesium level was low today. Be sure to take your magnesium oxide 400 mg twice daily and continue the potassium daily.

## 2019-12-08 NOTE — Progress Notes (Signed)
Per Dr. Benay Spice, give magnesium sulfate 2 grams IV x1 today for level of 1.5. Patient to continue oral magnesium supplement at home.   Demetrius Charity, PharmD, BCPS, [facility] Pharmacist Pharmacy Phone: [phone] 12/08/2019

## 2019-12-10 ENCOUNTER — Other Ambulatory Visit: Payer: Self-pay

## 2019-12-10 ENCOUNTER — Inpatient Hospital Stay: Payer: 59

## 2019-12-10 VITALS — BP 146/82 | HR 59 | Temp 98.7°F | Resp 18

## 2019-12-10 DIAGNOSIS — C19 Malignant neoplasm of rectosigmoid junction: Secondary | ICD-10-CM

## 2019-12-10 DIAGNOSIS — Z5111 Encounter for antineoplastic chemotherapy: Secondary | ICD-10-CM | POA: Diagnosis not present

## 2019-12-10 MED ORDER — PEGFILGRASTIM-CBQV 6 MG/0.6ML ~~LOC~~ SOSY
PREFILLED_SYRINGE | SUBCUTANEOUS | Status: AC
  Filled 2019-12-10: qty 0.6

## 2019-12-10 MED ORDER — SODIUM CHLORIDE 0.9% FLUSH
10.0000 mL | INTRAVENOUS | Status: DC | PRN
  Administered 2019-12-10: 10 mL
  Filled 2019-12-10: qty 10

## 2019-12-10 MED ORDER — PEGFILGRASTIM-CBQV 6 MG/0.6ML ~~LOC~~ SOSY
6.0000 mg | PREFILLED_SYRINGE | Freq: Once | SUBCUTANEOUS | Status: AC
  Administered 2019-12-10: 6 mg via SUBCUTANEOUS

## 2019-12-10 MED ORDER — HEPARIN SOD (PORK) LOCK FLUSH 100 UNIT/ML IV SOLN
500.0000 [IU] | Freq: Once | INTRAVENOUS | Status: AC | PRN
  Administered 2019-12-10: 500 [IU]
  Filled 2019-12-10: qty 5

## 2019-12-10 NOTE — Patient Instructions (Signed)
Implanted Port Home Guide An implanted port is a device that is placed under the skin. It is usually placed in the chest. The device can be used to give IV medicine, to take blood, or for dialysis. You may have an implanted port if:  You need IV medicine that would be irritating to the small veins in your hands or arms.  You need IV medicines, such as antibiotics, for a long period of time.  You need IV nutrition for a long period of time.  You need dialysis. Having a port means that your health care provider will not need to use the veins in your arms for these procedures. You may have fewer limitations when using a port than you would if you used other types of long-term IVs, and you will likely be able to return to normal activities after your incision heals. An implanted port has two main parts:  Reservoir. The reservoir is the part where a needle is inserted to give medicines or draw blood. The reservoir is round. After it is placed, it appears as a small, raised area under your skin.  Catheter. The catheter is a thin, flexible tube that connects the reservoir to a vein. Medicine that is inserted into the reservoir goes into the catheter and then into the vein. How is my port accessed? To access your port:  A numbing cream may be placed on the skin over the port site.  Your health care provider will put on a mask and sterile gloves.  The skin over your port will be cleaned carefully with a germ-killing soap and allowed to dry.  Your health care provider will gently pinch the port and insert a needle into it.  Your health care provider will check for a blood return to make sure the port is in the vein and is not clogged.  If your port needs to remain accessed to get medicine continuously (constant infusion), your health care provider will place a clear bandage (dressing) over the needle site. The dressing and needle will need to be changed every week, or as told by your health care  provider. What is flushing? Flushing helps keep the port from getting clogged. Follow instructions from your health care provider about how and when to flush the port. Ports are usually flushed with saline solution or a medicine called heparin. The need for flushing will depend on how the port is used:  If the port is only used from time to time to give medicines or draw blood, the port may need to be flushed: ? Before and after medicines have been given. ? Before and after blood has been drawn. ? As part of routine maintenance. Flushing may be recommended every 4-6 weeks.  If a constant infusion is running, the port may not need to be flushed.  Throw away any syringes in a disposal container that is meant for sharp items (sharps container). You can buy a sharps container from a pharmacy, or you can make one by using an empty hard plastic bottle with a cover. How long will my port stay implanted? The port can stay in for as long as your health care provider thinks it is needed. When it is time for the port to come out, a surgery will be done to remove it. The surgery will be similar to the procedure that was done to put the port in. Follow these instructions at home:   Flush your port as told by your health care provider.    If you need an infusion over several days, follow instructions from your health care provider about how to take care of your port site. Make sure you: ? Wash your hands with soap and water before you change your dressing. If soap and water are not available, use alcohol-based hand sanitizer. ? Change your dressing as told by your health care provider. ? Place any used dressings or infusion bags into a plastic bag. Throw that bag in the trash. ? Keep the dressing that covers the needle clean and dry. Do not get it wet. ? Do not use scissors or sharp objects near the tube. ? Keep the tube clamped, unless it is being used.  Check your port site every day for signs of  infection. Check for: ? Redness, swelling, or pain. ? Fluid or blood. ? Pus or a bad smell.  Protect the skin around the port site. ? Avoid wearing bra straps that rub or irritate the site. ? Protect the skin around your port from seat belts. Place a soft pad over your chest if needed.  Bathe or shower as told by your health care provider. The site may get wet as long as you are not actively receiving an infusion.  Return to your normal activities as told by your health care provider. Ask your health care provider what activities are safe for you.  Carry a medical alert card or wear a medical alert bracelet at all times. This will let health care providers know that you have an implanted port in case of an emergency. Get help right away if:  You have redness, swelling, or pain at the port site.  You have fluid or blood coming from your port site.  You have pus or a bad smell coming from the port site.  You have a fever. Summary  Implanted ports are usually placed in the chest for long-term IV access.  Follow instructions from your health care provider about flushing the port and changing bandages (dressings).  Take care of the area around your port by avoiding clothing that puts pressure on the area, and by watching for signs of infection.  Protect the skin around your port from seat belts. Place a soft pad over your chest if needed.  Get help right away if you have a fever or you have redness, swelling, pain, drainage, or a bad smell at the port site. This information is not intended to replace advice given to you by your health care provider. Make sure you discuss any questions you have with your health care provider. Document Revised: 10/23/2018 Document Reviewed: 08/03/2016 Elsevier Patient Education  2020 [employer] Pegfilgrastim injection What is this medicine? PEGFILGRASTIM (PEG fil gra stim) is a long-acting granulocyte colony-stimulating factor that stimulates the  growth of neutrophils, a type of white blood cell important in the body's fight against infection. It is used to reduce the incidence of fever and infection in patients with certain types of cancer who are receiving chemotherapy that affects the bone marrow, and to increase survival after being exposed to high doses of radiation. This medicine may be used for other purposes; ask your health care provider or pharmacist if you have questions. COMMON BRAND NAME(S): Fulphila, Neulasta, UDENYCA, Ziextenzo What should I tell my health care provider before I take this medicine? They need to know if you have any of these conditions:  kidney disease  latex allergy  ongoing radiation therapy  sickle cell disease  skin reactions to acrylic adhesives (On-Body   Injector only)  an unusual or allergic reaction to pegfilgrastim, filgrastim, other medicines, foods, dyes, or preservatives  pregnant or trying to get pregnant  breast-feeding How should I use this medicine? This medicine is for injection under the skin. If you get this medicine at home, you will be taught how to prepare and give the pre-filled syringe or how to use the On-body Injector. Refer to the patient Instructions for Use for detailed instructions. Use exactly as directed. Tell your healthcare provider immediately if you suspect that the On-body Injector may not have performed as intended or if you suspect the use of the On-body Injector resulted in a missed or partial dose. It is important that you put your used needles and syringes in a special sharps container. Do not put them in a trash can. If you do not have a sharps container, call your pharmacist or healthcare provider to get one. Talk to your pediatrician regarding the use of this medicine in children. While this drug may be prescribed for selected conditions, precautions do apply. Overdosage: If you think you have taken too much of this medicine contact a poison control center or  emergency room at once. NOTE: This medicine is only for you. Do not share this medicine with others. What if I miss a dose? It is important not to miss your dose. Call your doctor or health care professional if you miss your dose. If you miss a dose due to an On-body Injector failure or leakage, a new dose should be administered as soon as possible using a single prefilled syringe for manual use. What may interact with this medicine? Interactions have not been studied. Give your health care provider a list of all the medicines, herbs, non-prescription drugs, or dietary supplements you use. Also tell them if you smoke, drink alcohol, or use illegal drugs. Some items may interact with your medicine. This list may not describe all possible interactions. Give your health care provider a list of all the medicines, herbs, non-prescription drugs, or dietary supplements you use. Also tell them if you smoke, drink alcohol, or use illegal drugs. Some items may interact with your medicine. What should I watch for while using this medicine? You may need blood work done while you are taking this medicine. If you are going to need a MRI, CT scan, or other procedure, tell your doctor that you are using this medicine (On-Body Injector only). What side effects may I notice from receiving this medicine? Side effects that you should report to your doctor or health care professional as soon as possible:  allergic reactions like skin rash, itching or hives, swelling of the face, lips, or tongue  back pain  dizziness  fever  pain, redness, or irritation at site where injected  pinpoint red spots on the skin  red or dark-brown urine  shortness of breath or breathing problems  stomach or side pain, or pain at the shoulder  swelling  tiredness  trouble passing urine or change in the amount of urine Side effects that usually do not require medical attention (report to your doctor or health care  professional if they continue or are bothersome):  bone pain  muscle pain This list may not describe all possible side effects. Call your doctor for medical advice about side effects. You may report side effects to FDA at 1-800-FDA-1088. Where should I keep my medicine? Keep out of the reach of children. If you are using this medicine at home, you will be instructed   on how to store it. Throw away any unused medicine after the expiration date on the label. NOTE: This sheet is a summary. It may not cover all possible information. If you have questions about this medicine, talk to your doctor, pharmacist, or health care provider.  2020 Elsevier/Gold Standard (2017-10-06 16:57:08)  

## 2019-12-19 ENCOUNTER — Other Ambulatory Visit: Payer: Self-pay | Admitting: Oncology

## 2019-12-23 ENCOUNTER — Encounter: Payer: Self-pay | Admitting: Nurse Practitioner

## 2019-12-23 ENCOUNTER — Inpatient Hospital Stay: Payer: 59 | Attending: Physician Assistant | Admitting: Nurse Practitioner

## 2019-12-23 ENCOUNTER — Inpatient Hospital Stay: Payer: 59

## 2019-12-23 ENCOUNTER — Telehealth: Payer: Self-pay

## 2019-12-23 ENCOUNTER — Other Ambulatory Visit: Payer: Self-pay

## 2019-12-23 VITALS — BP 149/83 | HR 71 | Temp 97.7°F | Resp 18

## 2019-12-23 VITALS — Wt 159.3 lb

## 2019-12-23 DIAGNOSIS — C180 Malignant neoplasm of cecum: Secondary | ICD-10-CM | POA: Insufficient documentation

## 2019-12-23 DIAGNOSIS — Z5189 Encounter for other specified aftercare: Secondary | ICD-10-CM | POA: Insufficient documentation

## 2019-12-23 DIAGNOSIS — G893 Neoplasm related pain (acute) (chronic): Secondary | ICD-10-CM | POA: Insufficient documentation

## 2019-12-23 DIAGNOSIS — R110 Nausea: Secondary | ICD-10-CM | POA: Insufficient documentation

## 2019-12-23 DIAGNOSIS — D509 Iron deficiency anemia, unspecified: Secondary | ICD-10-CM | POA: Diagnosis not present

## 2019-12-23 DIAGNOSIS — C787 Secondary malignant neoplasm of liver and intrahepatic bile duct: Secondary | ICD-10-CM | POA: Diagnosis present

## 2019-12-23 DIAGNOSIS — C19 Malignant neoplasm of rectosigmoid junction: Secondary | ICD-10-CM

## 2019-12-23 DIAGNOSIS — Z5111 Encounter for antineoplastic chemotherapy: Secondary | ICD-10-CM | POA: Insufficient documentation

## 2019-12-23 DIAGNOSIS — G620 Drug-induced polyneuropathy: Secondary | ICD-10-CM | POA: Insufficient documentation

## 2019-12-23 LAB — CBC WITH DIFFERENTIAL (CANCER CENTER ONLY)
Abs Immature Granulocytes: 0.13 10*3/uL — ABNORMAL HIGH (ref 0.00–0.07)
Basophils Absolute: 0 10*3/uL (ref 0.0–0.1)
Basophils Relative: 1 %
Eosinophils Absolute: 0 10*3/uL (ref 0.0–0.5)
Eosinophils Relative: 0 %
HCT: 31.4 % — ABNORMAL LOW (ref 36.0–46.0)
Hemoglobin: 9.9 g/dL — ABNORMAL LOW (ref 12.0–15.0)
Immature Granulocytes: 2 %
Lymphocytes Relative: 19 %
Lymphs Abs: 1.6 10*3/uL (ref 0.7–4.0)
MCH: 25.6 pg — ABNORMAL LOW (ref 26.0–34.0)
MCHC: 31.5 g/dL (ref 30.0–36.0)
MCV: 81.3 fL (ref 80.0–100.0)
Monocytes Absolute: 0.5 10*3/uL (ref 0.1–1.0)
Monocytes Relative: 6 %
Neutro Abs: 5.9 10*3/uL (ref 1.7–7.7)
Neutrophils Relative %: 72 %
Platelet Count: 260 10*3/uL (ref 150–400)
RBC: 3.86 MIL/uL — ABNORMAL LOW (ref 3.87–5.11)
RDW: 18.4 % — ABNORMAL HIGH (ref 11.5–15.5)
WBC Count: 8.1 10*3/uL (ref 4.0–10.5)
nRBC: 0.2 % (ref 0.0–0.2)

## 2019-12-23 LAB — CMP (CANCER CENTER ONLY)
ALT: 22 U/L (ref 0–44)
AST: 23 U/L (ref 15–41)
Albumin: 2.6 g/dL — ABNORMAL LOW (ref 3.5–5.0)
Alkaline Phosphatase: 398 U/L — ABNORMAL HIGH (ref 38–126)
Anion gap: 9 (ref 5–15)
BUN: 9 mg/dL (ref 6–20)
CO2: 26 mmol/L (ref 22–32)
Calcium: 9.2 mg/dL (ref 8.9–10.3)
Chloride: 100 mmol/L (ref 98–111)
Creatinine: 0.64 mg/dL (ref 0.44–1.00)
GFR, Est AFR Am: >60 mL/min (ref >60)
GFR, Estimated: >60 mL/min (ref >60)
Glucose, Bld: 152 mg/dL — ABNORMAL HIGH (ref 70–99)
Potassium: 3.4 mmol/L — ABNORMAL LOW (ref 3.5–5.1)
Sodium: 135 mmol/L (ref 135–145)
Total Bilirubin: 0.3 mg/dL (ref 0.3–1.2)
Total Protein: 6.8 g/dL (ref 6.5–8.1)

## 2019-12-23 LAB — MAGNESIUM: Magnesium: 1.6 mg/dL — ABNORMAL LOW (ref 1.7–2.4)

## 2019-12-23 LAB — CEA (IN HOUSE-CHCC): CEA (CHCC-In House): 22861 ng/mL — ABNORMAL HIGH (ref 0.00–5.00)

## 2019-12-23 MED ORDER — OXALIPLATIN CHEMO INJECTION 100 MG/20ML
84.0000 mg/m2 | Freq: Once | INTRAVENOUS | Status: AC
  Administered 2019-12-23: 150 mg via INTRAVENOUS
  Filled 2019-12-23: qty 10

## 2019-12-23 MED ORDER — PALONOSETRON HCL INJECTION 0.25 MG/5ML
0.2500 mg | Freq: Once | INTRAVENOUS | Status: AC
  Administered 2019-12-23: 0.25 mg via INTRAVENOUS

## 2019-12-23 MED ORDER — SODIUM CHLORIDE 0.9% FLUSH
10.0000 mL | INTRAVENOUS | Status: DC | PRN
  Filled 2019-12-23: qty 10

## 2019-12-23 MED ORDER — OXALIPLATIN CHEMO INJECTION 100 MG/20ML: 85.0000 mg/m2 | Freq: Once | INTRAVENOUS | Status: DC

## 2019-12-23 MED ORDER — SODIUM CHLORIDE 0.9 % IV SOLN
10.0000 mg | Freq: Once | INTRAVENOUS | Status: AC
  Administered 2019-12-23: 10 mg via INTRAVENOUS
  Filled 2019-12-23: qty 10

## 2019-12-23 MED ORDER — DEXTROSE 5 % IV SOLN
Freq: Once | INTRAVENOUS | Status: AC
  Filled 2019-12-23: qty 250

## 2019-12-23 MED ORDER — SODIUM CHLORIDE 0.9 % IV SOLN
2000.0000 mg/m2 | INTRAVENOUS | Status: DC
  Administered 2019-12-23: 3600 mg via INTRAVENOUS
  Filled 2019-12-23: qty 72

## 2019-12-23 MED ORDER — LEUCOVORIN CALCIUM INJECTION 350 MG
300.0000 mg/m2 | Freq: Once | INTRAVENOUS | Status: AC
  Administered 2019-12-23: 544 mg via INTRAVENOUS
  Filled 2019-12-23: qty 27.2

## 2019-12-23 MED ORDER — SODIUM CHLORIDE 0.9 % IV SOLN
150.0000 mg | Freq: Once | INTRAVENOUS | Status: AC
  Administered 2019-12-23: 150 mg via INTRAVENOUS
  Filled 2019-12-23: qty 150

## 2019-12-23 MED ORDER — PALONOSETRON HCL INJECTION 0.25 MG/5ML
INTRAVENOUS | Status: AC
  Filled 2019-12-23: qty 5

## 2019-12-23 NOTE — Telephone Encounter (Signed)
Left voicemail for patient to call back [facility] 

## 2019-12-23 NOTE — Progress Notes (Addendum)
  [facility] OFFICE PROGRESS NOTE   Diagnosis: Colon cancer  INTERVAL HISTORY:   Ms. Endres returns as scheduled.  She completed cycle 5 FOLFOX 12/08/2019.  She tends to have very mild nausea after chemotherapy.  No vomiting.  Home antiemetics relieve the nausea.  No mouth sores.  No diarrhea.  Stools are soft.  She has a good appetite.  She has persistent cold sensitivity in the fingertips and around the lips. No numbness or tingling in the absence of cold exposure.  Objective:  Vital signs in last 24 hours:  Weight 159 lb 4.8 oz (72.3 kg).    HEENT: No thrush or ulcers. Resp: Lungs clear bilaterally. Cardio: Regular rate and rhythm. GI: Abdomen soft and nontender.  No hepatomegaly. Vascular: No leg edema. Neuro: Vibratory sense intact over the fingertips per tuning fork exam. Skin: Palms without erythema. Port-A-Cath without erythema.   Lab Results:  Lab Results  Component Value Date   WBC 8.1 12/23/2019   HGB 9.9 (L) 12/23/2019   HCT 31.4 (L) 12/23/2019   MCV 81.3 12/23/2019   PLT 260 12/23/2019   NEUTROABS 5.9 12/23/2019    Imaging:  No results found.  Medications: I have reviewed the patient's current medications.  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 She presented with a large cecal mass, extensive hepatic metastatic disease; mild thoracic and abdominal adenopathy. She also has several small nonspecific pulmonary nodules. She was diagnosed in March 2021.   Colonoscopy 09/16/2019-fungating mass at the cecum, sessile polyps in the this sigmoid, transverse colon, and hepatic flexure, adenocarcinoma involving cecum biopsy, tubular adenoma and sessile serrated polyps  CTs chest, abdomen, and pelvis 10/08/2019-extensive liver metastases, thoracic/abdominal adenopathy, cecal mass, dilated and thin thickened appendix, nonspecific pulmonary nodules  Ultrasound-guided liver biopsy 10/13/2019-adenocarcinoma consistent with a metastatic  colorectal primary, MSS, tumor mutation burden 3, KRAS Q61,  Cycle 1 FOLFOX 10/14/2019  Cycle 2 FOLFOX 10/27/2019  Cycle 3 FOLFOX 11/11/2019, Emend and Decadron added, 5-FU dose escalated  Cycle 4 FOLFOX 11/25/2019, Udenyca added  Cycle 5 FOLFOX 12/08/2019  Cycle 6 FOLFOX 12/23/2019 2) Iron deficiency anemia secondary to #1. 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 Huston appears stable.  She has completed 5 cycles of FOLFOX.  Plan to proceed with cycle 6 today as scheduled.  She will undergo restaging CT scans prior to her next visit.  We reviewed the CBC from today.  Counts adequate to proceed.  She will return for lab, follow-up, FOLFOX in 2 weeks, CT scans a few days prior.    Ned Card ANP/GNP-BC   12/23/2019  4:53 PM

## 2019-12-23 NOTE — Patient Instructions (Signed)
[facility] Discharge Instructions for Patients Receiving Chemotherapy  Today you received the following chemotherapy agents Oxaliplatin and Leucovorin and Adrucil.  To help prevent nausea and vomiting after your treatment, we encourage you to take your nausea medication as directed BUT NO ZOFRAN FOR 3 DAYS AFTER CHEMO.   If you develop nausea and vomiting that is not controlled by your nausea medication, call the clinic.   BELOW ARE SYMPTOMS THAT SHOULD BE REPORTED IMMEDIATELY:  *FEVER GREATER THAN 100.5 F  *CHILLS WITH OR WITHOUT FEVER  NAUSEA AND VOMITING THAT IS NOT CONTROLLED WITH YOUR NAUSEA MEDICATION  *UNUSUAL SHORTNESS OF BREATH  *UNUSUAL BRUISING OR BLEEDING  TENDERNESS IN MOUTH AND THROAT WITH OR WITHOUT PRESENCE OF ULCERS  *URINARY PROBLEMS  *BOWEL PROBLEMS  UNUSUAL RASH Items with * indicate a potential emergency and should be followed up as soon as possible.  Feel free to call the clinic you have any questions or concerns. The clinic phone number is [phone].  Please show the [place] at check-in to the Emergency Department and triage nurse.

## 2019-12-24 ENCOUNTER — Telehealth: Payer: Self-pay

## 2019-12-24 NOTE — Telephone Encounter (Signed)
TC ot pt per Ned Card NP to let her know that her magnesium is mildly low. Continue magnesium oxide twice daily. Patient verbalized understanding.

## 2019-12-25 ENCOUNTER — Other Ambulatory Visit: Payer: Self-pay

## 2019-12-25 ENCOUNTER — Inpatient Hospital Stay: Payer: 59

## 2019-12-25 VITALS — BP 151/70 | HR 67 | Temp 98.2°F | Resp 18

## 2019-12-25 DIAGNOSIS — Z5111 Encounter for antineoplastic chemotherapy: Secondary | ICD-10-CM | POA: Diagnosis not present

## 2019-12-25 MED ORDER — HEPARIN SOD (PORK) LOCK FLUSH 100 UNIT/ML IV SOLN
500.0000 [IU] | Freq: Once | INTRAVENOUS | Status: AC | PRN
  Administered 2019-12-25: 500 [IU]
  Filled 2019-12-25: qty 5

## 2019-12-25 MED ORDER — SODIUM CHLORIDE 0.9% FLUSH
10.0000 mL | INTRAVENOUS | Status: DC | PRN
  Administered 2019-12-25: 10 mL
  Filled 2019-12-25: qty 10

## 2019-12-25 MED ORDER — PEGFILGRASTIM-CBQV 6 MG/0.6ML ~~LOC~~ SOSY
6.0000 mg | PREFILLED_SYRINGE | Freq: Once | SUBCUTANEOUS | Status: AC
  Administered 2019-12-25: 6 mg via SUBCUTANEOUS

## 2019-12-25 NOTE — Patient Instructions (Signed)

## 2020-01-01 ENCOUNTER — Other Ambulatory Visit: Payer: Self-pay | Admitting: Oncology

## 2020-01-04 ENCOUNTER — Encounter (HOSPITAL_COMMUNITY): Payer: Self-pay

## 2020-01-04 ENCOUNTER — Ambulatory Visit (HOSPITAL_COMMUNITY)
Admission: RE | Admit: 2020-01-04 | Discharge: 2020-01-04 | Disposition: A | Discharge status: Home / Self Care | Payer: 59 | Source: Ambulatory Visit | Attending: Nurse Practitioner | Admitting: Nurse Practitioner

## 2020-01-04 ENCOUNTER — Other Ambulatory Visit: Payer: Self-pay

## 2020-01-04 DIAGNOSIS — C19 Malignant neoplasm of rectosigmoid junction: Secondary | ICD-10-CM | POA: Insufficient documentation

## 2020-01-04 MED ORDER — SODIUM CHLORIDE (PF) 0.9 % IJ SOLN
INTRAMUSCULAR | Status: AC
  Filled 2020-01-04: qty 50

## 2020-01-04 MED ORDER — IOHEXOL 300 MG/ML  SOLN
100.0000 mL | Freq: Once | INTRAMUSCULAR | Status: AC | PRN
  Administered 2020-01-04: 100 mL via INTRAVENOUS

## 2020-01-04 MED ORDER — HEPARIN SOD (PORK) LOCK FLUSH 100 UNIT/ML IV SOLN
500.0000 [IU] | Freq: Once | INTRAVENOUS | Status: AC
  Administered 2020-01-04: 500 [IU]

## 2020-01-04 MED ORDER — HEPARIN SOD (PORK) LOCK FLUSH 100 UNIT/ML IV SOLN
INTRAVENOUS | Status: AC
  Filled 2020-01-04: qty 5

## 2020-01-06 ENCOUNTER — Other Ambulatory Visit: Payer: Self-pay

## 2020-01-06 ENCOUNTER — Inpatient Hospital Stay (HOSPITAL_BASED_OUTPATIENT_CLINIC_OR_DEPARTMENT_OTHER): Payer: 59 | Admitting: Oncology

## 2020-01-06 ENCOUNTER — Inpatient Hospital Stay: Payer: 59

## 2020-01-06 ENCOUNTER — Inpatient Hospital Stay: Payer: 59 | Admitting: Nutrition

## 2020-01-06 VITALS — BP 147/87 | HR 78 | Temp 97.6°F | Resp 18 | Ht 62.5 in | Wt 155.1 lb

## 2020-01-06 DIAGNOSIS — C19 Malignant neoplasm of rectosigmoid junction: Secondary | ICD-10-CM

## 2020-01-06 DIAGNOSIS — Z5111 Encounter for antineoplastic chemotherapy: Secondary | ICD-10-CM | POA: Diagnosis not present

## 2020-01-06 DIAGNOSIS — D509 Iron deficiency anemia, unspecified: Secondary | ICD-10-CM

## 2020-01-06 LAB — CBC WITH DIFFERENTIAL (CANCER CENTER ONLY)
Abs Immature Granulocytes: 0.04 10*3/uL (ref 0.00–0.07)
Basophils Absolute: 0 10*3/uL (ref 0.0–0.1)
Basophils Relative: 1 %
Eosinophils Absolute: 0 10*3/uL (ref 0.0–0.5)
Eosinophils Relative: 0 %
HCT: 33.6 % — ABNORMAL LOW (ref 36.0–46.0)
Hemoglobin: 10.7 g/dL — ABNORMAL LOW (ref 12.0–15.0)
Immature Granulocytes: 1 %
Lymphocytes Relative: 31 %
Lymphs Abs: 1.8 10*3/uL (ref 0.7–4.0)
MCH: 26.2 pg (ref 26.0–34.0)
MCHC: 31.8 g/dL (ref 30.0–36.0)
MCV: 82.4 fL (ref 80.0–100.0)
Monocytes Absolute: 0.6 10*3/uL (ref 0.1–1.0)
Monocytes Relative: 10 %
Neutro Abs: 3.4 10*3/uL (ref 1.7–7.7)
Neutrophils Relative %: 57 %
Platelet Count: 255 10*3/uL (ref 150–400)
RBC: 4.08 MIL/uL (ref 3.87–5.11)
RDW: 18.4 % — ABNORMAL HIGH (ref 11.5–15.5)
WBC Count: 5.8 10*3/uL (ref 4.0–10.5)
nRBC: 0 % (ref 0.0–0.2)

## 2020-01-06 LAB — CMP (CANCER CENTER ONLY)
ALT: 18 U/L (ref 0–44)
AST: 18 U/L (ref 15–41)
Albumin: 3 g/dL — ABNORMAL LOW (ref 3.5–5.0)
Alkaline Phosphatase: 399 U/L — ABNORMAL HIGH (ref 38–126)
Anion gap: 12 (ref 5–15)
BUN: 6 mg/dL (ref 6–20)
CO2: 25 mmol/L (ref 22–32)
Calcium: 9.5 mg/dL (ref 8.9–10.3)
Chloride: 101 mmol/L (ref 98–111)
Creatinine: 0.64 mg/dL (ref 0.44–1.00)
GFR, Est AFR Am: >60 mL/min (ref >60)
GFR, Estimated: >60 mL/min (ref >60)
Glucose, Bld: 82 mg/dL (ref 70–99)
Potassium: 3.7 mmol/L (ref 3.5–5.1)
Sodium: 138 mmol/L (ref 135–145)
Total Bilirubin: 0.5 mg/dL (ref 0.3–1.2)
Total Protein: 7.5 g/dL (ref 6.5–8.1)

## 2020-01-06 LAB — MAGNESIUM: Magnesium: 1.6 mg/dL — ABNORMAL LOW (ref 1.7–2.4)

## 2020-01-06 MED ORDER — HEPARIN SOD (PORK) LOCK FLUSH 100 UNIT/ML IV SOLN
500.0000 [IU] | Freq: Once | INTRAVENOUS | Status: DC | PRN
  Filled 2020-01-06: qty 5

## 2020-01-06 MED ORDER — POTASSIUM CHLORIDE ER 10 MEQ PO CPCR: 10.0000 meq | ORAL_CAPSULE | Freq: Two times a day (BID) | ORAL | 2 refills | Status: DC

## 2020-01-06 MED ORDER — DEXTROSE 5 % IV SOLN
Freq: Once | INTRAVENOUS | Status: AC
  Filled 2020-01-06: qty 250

## 2020-01-06 MED ORDER — LEUCOVORIN CALCIUM INJECTION 350 MG
300.0000 mg/m2 | Freq: Once | INTRAVENOUS | Status: AC
  Administered 2020-01-06: 544 mg via INTRAVENOUS
  Filled 2020-01-06: qty 27.2

## 2020-01-06 MED ORDER — PALONOSETRON HCL INJECTION 0.25 MG/5ML
0.2500 mg | Freq: Once | INTRAVENOUS | Status: AC
  Administered 2020-01-06: 0.25 mg via INTRAVENOUS

## 2020-01-06 MED ORDER — SODIUM CHLORIDE 0.9% FLUSH
10.0000 mL | INTRAVENOUS | Status: DC | PRN
  Filled 2020-01-06: qty 10

## 2020-01-06 MED ORDER — SODIUM CHLORIDE 0.9% FLUSH
10.0000 mL | Freq: Once | INTRAVENOUS | Status: AC | PRN
  Administered 2020-01-06: 10 mL
  Filled 2020-01-06: qty 10

## 2020-01-06 MED ORDER — SODIUM CHLORIDE 0.9 % IV SOLN
2000.0000 mg/m2 | INTRAVENOUS | Status: DC
  Administered 2020-01-06: 3600 mg via INTRAVENOUS
  Filled 2020-01-06: qty 72

## 2020-01-06 MED ORDER — SODIUM CHLORIDE 0.9 % IV SOLN
10.0000 mg | Freq: Once | INTRAVENOUS | Status: AC
  Administered 2020-01-06: 10 mg via INTRAVENOUS
  Filled 2020-01-06: qty 10

## 2020-01-06 MED ORDER — SODIUM CHLORIDE 0.9 % IV SOLN
150.0000 mg | Freq: Once | INTRAVENOUS | Status: AC
  Administered 2020-01-06: 150 mg via INTRAVENOUS
  Filled 2020-01-06: qty 150

## 2020-01-06 MED ORDER — PALONOSETRON HCL INJECTION 0.25 MG/5ML
INTRAVENOUS | Status: AC
  Filled 2020-01-06: qty 5

## 2020-01-06 MED ORDER — OXALIPLATIN CHEMO INJECTION 100 MG/20ML
85.0000 mg/m2 | Freq: Once | INTRAVENOUS | Status: AC
  Administered 2020-01-06: 155 mg via INTRAVENOUS
  Filled 2020-01-06: qty 31

## 2020-01-06 NOTE — Patient Instructions (Signed)
[facility] Discharge Instructions for Patients Receiving Chemotherapy  Today you received the following chemotherapy agents Oxaliplatin and Leucovorin and Adrucil.  To help prevent nausea and vomiting after your treatment, we encourage you to take your nausea medication as directed BUT NO ZOFRAN FOR 3 DAYS AFTER CHEMO.   If you develop nausea and vomiting that is not controlled by your nausea medication, call the clinic.   BELOW ARE SYMPTOMS THAT SHOULD BE REPORTED IMMEDIATELY:  *FEVER GREATER THAN 100.5 F  *CHILLS WITH OR WITHOUT FEVER  NAUSEA AND VOMITING THAT IS NOT CONTROLLED WITH YOUR NAUSEA MEDICATION  *UNUSUAL SHORTNESS OF BREATH  *UNUSUAL BRUISING OR BLEEDING  TENDERNESS IN MOUTH AND THROAT WITH OR WITHOUT PRESENCE OF ULCERS  *URINARY PROBLEMS  *BOWEL PROBLEMS  UNUSUAL RASH Items with * indicate a potential emergency and should be followed up as soon as possible.  Feel free to call the clinic you have any questions or concerns. The clinic phone number is [phone].  Please show the [facility] at check-in to the Emergency Department and triage nurse.

## 2020-01-06 NOTE — Progress Notes (Signed)
[place] OFFICE PROGRESS NOTE   Diagnosis: Colon cancer  INTERVAL HISTORY:   Ms. Furnari pleated another cycle of FOLFOX on 12/23/2019.  No nausea/vomiting, mouth sores, or diarrhea.  She reports persistent cold sensitivity.  No other neuropathy symptoms.  No pain.  Objective:  Vital signs in last 24 hours:  Blood pressure (!) 147/87, pulse 78, temperature 97.6 F (36.4 C), temperature source Temporal, resp. rate 18, height 5' 2.5" (1.588 m), weight 155 lb 1.6 oz (70.4 kg), SpO2 100 %.    HEENT: No thrush or ulcers Resp: Lungs clear bilaterally Cardio: Regular rate and rhythm GI: No hepatosplenomegaly, mild tenderness in the right upper abdomen Vascular: No leg edema Neuro: Mild to very mild loss of vibratory sense at the fingertips bilaterally Skin: Palms without erythema  Portacath/PICC-without erythema  Lab Results:  Lab Results  Component Value Date   WBC 5.8 01/06/2020   HGB 10.7 (L) 01/06/2020   HCT 33.6 (L) 01/06/2020   MCV 82.4 01/06/2020   PLT 255 01/06/2020   NEUTROABS 3.4 01/06/2020    CMP  Lab Results  Component Value Date   NA 138 01/06/2020   K 3.7 01/06/2020   CL 101 01/06/2020   CO2 25 01/06/2020   GLUCOSE 82 01/06/2020   BUN 6 01/06/2020   CREATININE 0.64 01/06/2020   CALCIUM 9.5 01/06/2020   PROT 7.5 01/06/2020   ALBUMIN 3.0 (L) 01/06/2020   AST 18 01/06/2020   ALT 18 01/06/2020   ALKPHOS 399 (H) 01/06/2020   BILITOT 0.5 01/06/2020   GFRNONAA >60 01/06/2020   GFRAA >60 01/06/2020    Lab Results  Component Value Date   CEA1 22,861.00 (H) 12/23/2019      Imaging:  CT Chest W Contrast  Result Date: 01/04/2020 CLINICAL DATA:  Metastatic colorectal carcinoma. Liver metastasis. Chemotherapy in progress. EXAM: CT CHEST, ABDOMEN, AND PELVIS WITH CONTRAST TECHNIQUE: Multidetector CT imaging of the chest, abdomen and pelvis was performed following the standard protocol during bolus administration of intravenous contrast.  CONTRAST:  182m OMNIPAQUE IOHEXOL 300 MG/ML  SOLN COMPARISON:  CT 12/08/2019 FINDINGS: CT CHEST FINDINGS Cardiovascular: Port in the anterior chest wall with tip in distal SVC. No significant vascular findings. Normal heart size. No pericardial effusion. Mediastinum/Nodes: No axillary or supraclavicular adenopathy. No mediastinal adenopathy. No pericardial effusion. Esophagus. Lungs/Pleura: No small angular ill-defined nodule in the RIGHT middle lobe (image 82/8) is unchanged. Musculoskeletal: No aggressive osseous lesion. CT ABDOMEN AND PELVIS FINDINGS Hepatobiliary: Multiple round hypoenhancing masses within LEFT and RIGHT hepatic lobe. Index lesion in the posterior LEFT hepatic lobe laterally measures 4.7 cm decreased from 7.4. Index lesion just above the posterior branch of the RIGHT hepatic vein measures 2.5 cm (image 57) compared to 3.9 cm. Index lesion in the dome of the central LEFT hepatic lobe measures 1.4 cm compared to 2.5 cm. Visually lesions appear smaller. Portal veins are patent. Gallbladder collapsed image 56/2) compared to 7.4 cm. Pancreas: Pancreas is normal. No ductal dilatation. No pancreatic inflammation. Spleen: Normal spleen Adrenals/urinary tract: Adrenal glands normal. Nonobstructing calculus in the RIGHT kidney. Ureters and bladder normal. Stomach/Bowel: Stomach and small bowel normal. There is thickening of the distal ileum and cecum at the terminal ileum (image 95/2. This thickening extends into the cecal tip measuring 3 cm on image 63/4. This thickened process appears to extend into the base of the appendix (image 70/4). No evidence of bowel obstruction. In comparison there is a mass on comparison CT from 10/08/2019. The mass appears  slightly reduced in size although difficult to measure. There is reduction in small bowel dilatation associated with the cecal/terminal ileum mass. There several enlarged lymph nodes in the ileo cecal mesentery including a 16 mm node on coronal image 69/4.  Similar comparison exam. The ascending, transverse and descending colon normal. Diverticula sigmoid colon. Rectum normal. Vascular/Lymphatic: Abdominal aorta normal. prominent lymph nodes in the ileocecal mesentery as described above. No iliac adenopathy. A small periaortic lymph nodes are noted which are less than pathologic size. Reproductive: Uterus contains calcified leiomyoma. Adnexa are unremarkable. Other: No free fluid. Musculoskeletal: No aggressive osseous lesion. IMPRESSION: Chest Impression: No evidence of thoracic metastasis. Abdomen / Pelvis Impression: 1. Interval decrease in size of multifocal hepatic metastasis. 2. Mass lesion in the cecum extending into the appendix as well as the terminal ileum. Mass is slightly less bulky than comparison exam but difficult to measure. 3. Decrease in small bowel dilatation compared to prior. 4. Metastatic adenopathy in the ileocecal mesentery is similar. Electronically Signed   By: Suzy Bouchard M.D.   On: 01/04/2020 16:29   CT Abdomen Pelvis W Contrast  Result Date: 01/04/2020 CLINICAL DATA:  Metastatic colorectal carcinoma. Liver metastasis. Chemotherapy in progress. EXAM: CT CHEST, ABDOMEN, AND PELVIS WITH CONTRAST TECHNIQUE: Multidetector CT imaging of the chest, abdomen and pelvis was performed following the standard protocol during bolus administration of intravenous contrast. CONTRAST:  152m OMNIPAQUE IOHEXOL 300 MG/ML  SOLN COMPARISON:  CT 12/08/2019 FINDINGS: CT CHEST FINDINGS Cardiovascular: Port in the anterior chest wall with tip in distal SVC. No significant vascular findings. Normal heart size. No pericardial effusion. Mediastinum/Nodes: No axillary or supraclavicular adenopathy. No mediastinal adenopathy. No pericardial effusion. Esophagus. Lungs/Pleura: No small angular ill-defined nodule in the RIGHT middle lobe (image 82/8) is unchanged. Musculoskeletal: No aggressive osseous lesion. CT ABDOMEN AND PELVIS FINDINGS Hepatobiliary: Multiple  round hypoenhancing masses within LEFT and RIGHT hepatic lobe. Index lesion in the posterior LEFT hepatic lobe laterally measures 4.7 cm decreased from 7.4. Index lesion just above the posterior branch of the RIGHT hepatic vein measures 2.5 cm (image 57) compared to 3.9 cm. Index lesion in the dome of the central LEFT hepatic lobe measures 1.4 cm compared to 2.5 cm. Visually lesions appear smaller. Portal veins are patent. Gallbladder collapsed image 56/2) compared to 7.4 cm. Pancreas: Pancreas is normal. No ductal dilatation. No pancreatic inflammation. Spleen: Normal spleen Adrenals/urinary tract: Adrenal glands normal. Nonobstructing calculus in the RIGHT kidney. Ureters and bladder normal. Stomach/Bowel: Stomach and small bowel normal. There is thickening of the distal ileum and cecum at the terminal ileum (image 95/2. This thickening extends into the cecal tip measuring 3 cm on image 63/4. This thickened process appears to extend into the base of the appendix (image 70/4). No evidence of bowel obstruction. In comparison there is a mass on comparison CT from 10/08/2019. The mass appears slightly reduced in size although difficult to measure. There is reduction in small bowel dilatation associated with the cecal/terminal ileum mass. There several enlarged lymph nodes in the ileo cecal mesentery including a 16 mm node on coronal image 69/4. Similar comparison exam. The ascending, transverse and descending colon normal. Diverticula sigmoid colon. Rectum normal. Vascular/Lymphatic: Abdominal aorta normal. prominent lymph nodes in the ileocecal mesentery as described above. No iliac adenopathy. A small periaortic lymph nodes are noted which are less than pathologic size. Reproductive: Uterus contains calcified leiomyoma. Adnexa are unremarkable. Other: No free fluid. Musculoskeletal: No aggressive osseous lesion. IMPRESSION: Chest Impression: No evidence of thoracic metastasis.  Abdomen / Pelvis Impression: 1.  Interval decrease in size of multifocal hepatic metastasis. 2. Mass lesion in the cecum extending into the appendix as well as the terminal ileum. Mass is slightly less bulky than comparison exam but difficult to measure. 3. Decrease in small bowel dilatation compared to prior. 4. Metastatic adenopathy in the ileocecal mesentery is similar. Electronically Signed   By: Suzy Bouchard M.D.   On: 01/04/2020 16:29    Medications: I have reviewed the patient's current medications.   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 She presented with a large cecal mass, extensive hepatic metastatic disease; mild thoracic and abdominal adenopathy. She also has several small nonspecific pulmonary nodules. She was diagnosed in March 2021.   Colonoscopy 09/16/2019-fungating mass at the cecum, sessile polyps in the this sigmoid, transverse colon, and hepatic flexure, adenocarcinoma involving cecum biopsy, tubular adenoma and sessile serrated polyps  CTs chest, abdomen, and pelvis 10/08/2019-extensive liver metastases, thoracic/abdominal adenopathy, cecal mass, dilated and thin thickened appendix, nonspecific pulmonary nodules  Ultrasound-guided liver biopsy 10/13/2019-adenocarcinoma consistent with a metastatic colorectal primary, MSS, tumor mutation burden 3, KRAS Q61,  Cycle 1 FOLFOX 10/14/2019  Cycle 2 FOLFOX 10/27/2019  Cycle 3 FOLFOX 11/11/2019, Emend and Decadron added, 5-FU dose escalated  Cycle 4 FOLFOX 11/25/2019, Udenyca added  Cycle 5 FOLFOX 12/08/2019  Cycle 6 FOLFOX 12/23/2019  CT 01/04/2020-decreased size of multiple hepatic metastases, slight decrease in bulk of cecal mass, stable ileocecal adenopathy 2) Iron deficiency anemia secondary to #1. 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 Slay has metastatic colon cancer.  She has completed 6 cycles of FOLFOX.  I reviewed the CT findings and images with Ms. Rumbaugh and her family.  There has been marked clinical and partial radiologic improvement.  I recommend continuing FOLFOX.  She will complete cycle 7 FOLFOX today.  Ms. Hunsucker will return for an office visit and chemotherapy in 2 weeks.  We will plan for a restaging CT after cycle 10.  We will then consider maintenance therapy versus changing to FOLFIRI/Avastin.  Betsy Coder, MD  01/06/2020  1:22 PM

## 2020-01-06 NOTE — Progress Notes (Signed)
Per Dr. Benay Spice: OK to treat w/Mg+ 1.6

## 2020-01-06 NOTE — Progress Notes (Signed)
Nutrition follow-up completed with patient during infusion for colon cancer. Weight down to 155.1 pounds on June 24 from 159.3 pounds June 10. Patient has been between 155 and 159 pounds the past 5 weeks. Patient denies nausea, vomiting, constipation, and diarrhea. Reports she is drinking oral nutrition supplements, 1-2 bottles daily.  She is requesting samples. She has no questions or concerns. Labs reviewed.  Nutrition diagnosis: Severe malnutrition improving.  Intervention: I educated patient to continue strategies for small frequent meals and snacks daily. Increase oral nutrition supplements twice daily. Provided samples. Teach back method used.  Monitoring, evaluation, goals: Patient will tolerate increased calories and protein to stabilize weight.  Next visit: I am available to follow-up with patient as needed.  Please consult RD for further interventions.  **Disclaimer: This note was dictated with voice recognition software. Similar sounding words can inadvertently be transcribed and this note may contain transcription errors which may not have been corrected upon publication of note.**

## 2020-01-07 ENCOUNTER — Telehealth: Payer: Self-pay | Admitting: Nurse Practitioner

## 2020-01-07 ENCOUNTER — Telehealth: Payer: Self-pay | Admitting: Oncology

## 2020-01-07 NOTE — Telephone Encounter (Signed)
R/s appt on 6/26 to 2:00 per Heidi.

## 2020-01-07 NOTE — Telephone Encounter (Signed)
Scheduled appts per 6/24 los. Pt to get updated appt calendar at next appt per appt notes.

## 2020-01-08 ENCOUNTER — Inpatient Hospital Stay: Payer: 59

## 2020-01-08 DIAGNOSIS — C19 Malignant neoplasm of rectosigmoid junction: Secondary | ICD-10-CM

## 2020-01-08 DIAGNOSIS — Z5111 Encounter for antineoplastic chemotherapy: Secondary | ICD-10-CM | POA: Diagnosis not present

## 2020-01-08 MED ORDER — HEPARIN SOD (PORK) LOCK FLUSH 100 UNIT/ML IV SOLN
500.0000 [IU] | Freq: Once | INTRAVENOUS | Status: AC | PRN
  Administered 2020-01-08: 500 [IU]
  Filled 2020-01-08: qty 5

## 2020-01-08 MED ORDER — SODIUM CHLORIDE 0.9% FLUSH
10.0000 mL | INTRAVENOUS | Status: DC | PRN
  Administered 2020-01-08: 10 mL
  Filled 2020-01-08: qty 10

## 2020-01-08 MED ORDER — PEGFILGRASTIM-CBQV 6 MG/0.6ML ~~LOC~~ SOSY
6.0000 mg | PREFILLED_SYRINGE | Freq: Once | SUBCUTANEOUS | Status: AC
  Administered 2020-01-08: 6 mg via SUBCUTANEOUS

## 2020-01-08 NOTE — Patient Instructions (Signed)

## 2020-01-17 ENCOUNTER — Other Ambulatory Visit: Payer: Self-pay | Admitting: Oncology

## 2020-01-20 ENCOUNTER — Telehealth: Payer: Self-pay

## 2020-01-20 ENCOUNTER — Encounter: Payer: Self-pay | Admitting: Nurse Practitioner

## 2020-01-20 ENCOUNTER — Other Ambulatory Visit: Payer: Self-pay

## 2020-01-20 ENCOUNTER — Inpatient Hospital Stay: Payer: 59

## 2020-01-20 ENCOUNTER — Inpatient Hospital Stay: Payer: 59 | Attending: Physician Assistant | Admitting: Nurse Practitioner

## 2020-01-20 VITALS — BP 150/75 | HR 73 | Temp 97.2°F | Resp 17 | Ht 62.5 in | Wt 156.7 lb

## 2020-01-20 DIAGNOSIS — Z5189 Encounter for other specified aftercare: Secondary | ICD-10-CM | POA: Insufficient documentation

## 2020-01-20 DIAGNOSIS — C180 Malignant neoplasm of cecum: Secondary | ICD-10-CM | POA: Insufficient documentation

## 2020-01-20 DIAGNOSIS — C19 Malignant neoplasm of rectosigmoid junction: Secondary | ICD-10-CM | POA: Diagnosis not present

## 2020-01-20 DIAGNOSIS — R197 Diarrhea, unspecified: Secondary | ICD-10-CM | POA: Insufficient documentation

## 2020-01-20 DIAGNOSIS — G893 Neoplasm related pain (acute) (chronic): Secondary | ICD-10-CM | POA: Insufficient documentation

## 2020-01-20 DIAGNOSIS — Z5111 Encounter for antineoplastic chemotherapy: Secondary | ICD-10-CM | POA: Insufficient documentation

## 2020-01-20 DIAGNOSIS — D509 Iron deficiency anemia, unspecified: Secondary | ICD-10-CM

## 2020-01-20 DIAGNOSIS — C787 Secondary malignant neoplasm of liver and intrahepatic bile duct: Secondary | ICD-10-CM | POA: Insufficient documentation

## 2020-01-20 DIAGNOSIS — G620 Drug-induced polyneuropathy: Secondary | ICD-10-CM | POA: Insufficient documentation

## 2020-01-20 LAB — CBC WITH DIFFERENTIAL (CANCER CENTER ONLY)
Abs Immature Granulocytes: 0.09 10*3/uL — ABNORMAL HIGH (ref 0.00–0.07)
Basophils Absolute: 0 10*3/uL (ref 0.0–0.1)
Basophils Relative: 0 %
Eosinophils Absolute: 0 10*3/uL (ref 0.0–0.5)
Eosinophils Relative: 0 %
HCT: 32.6 % — ABNORMAL LOW (ref 36.0–46.0)
Hemoglobin: 10.4 g/dL — ABNORMAL LOW (ref 12.0–15.0)
Immature Granulocytes: 1 %
Lymphocytes Relative: 22 %
Lymphs Abs: 1.9 10*3/uL (ref 0.7–4.0)
MCH: 26.5 pg (ref 26.0–34.0)
MCHC: 31.9 g/dL (ref 30.0–36.0)
MCV: 83 fL (ref 80.0–100.0)
Monocytes Absolute: 0.6 10*3/uL (ref 0.1–1.0)
Monocytes Relative: 7 %
Neutro Abs: 6.1 10*3/uL (ref 1.7–7.7)
Neutrophils Relative %: 70 %
Platelet Count: 218 10*3/uL (ref 150–400)
RBC: 3.93 MIL/uL (ref 3.87–5.11)
RDW: 17.8 % — ABNORMAL HIGH (ref 11.5–15.5)
WBC Count: 8.7 10*3/uL (ref 4.0–10.5)
nRBC: 0 % (ref 0.0–0.2)

## 2020-01-20 LAB — CMP (CANCER CENTER ONLY)
ALT: 38 U/L (ref 0–44)
AST: 34 U/L (ref 15–41)
Albumin: 3 g/dL — ABNORMAL LOW (ref 3.5–5.0)
Alkaline Phosphatase: 424 U/L — ABNORMAL HIGH (ref 38–126)
Anion gap: 11 (ref 5–15)
BUN: 7 mg/dL (ref 6–20)
CO2: 26 mmol/L (ref 22–32)
Calcium: 9.6 mg/dL (ref 8.9–10.3)
Chloride: 101 mmol/L (ref 98–111)
Creatinine: 0.63 mg/dL (ref 0.44–1.00)
GFR, Est AFR Am: >60 mL/min (ref >60)
GFR, Estimated: >60 mL/min (ref >60)
Glucose, Bld: 87 mg/dL (ref 70–99)
Potassium: 3.8 mmol/L (ref 3.5–5.1)
Sodium: 138 mmol/L (ref 135–145)
Total Bilirubin: 0.4 mg/dL (ref 0.3–1.2)
Total Protein: 7.6 g/dL (ref 6.5–8.1)

## 2020-01-20 LAB — C DIFFICILE QUICK SCREEN W PCR REFLEX
C Diff antigen: NEGATIVE
C Diff interpretation: NOT DETECTED
C Diff toxin: NEGATIVE

## 2020-01-20 LAB — MAGNESIUM: Magnesium: 1.8 mg/dL (ref 1.7–2.4)

## 2020-01-20 LAB — CEA (IN HOUSE-CHCC): CEA (CHCC-In House): 12381.87 ng/mL — ABNORMAL HIGH (ref 0.00–5.00)

## 2020-01-20 MED ORDER — HEPARIN SOD (PORK) LOCK FLUSH 100 UNIT/ML IV SOLN
500.0000 [IU] | Freq: Once | INTRAVENOUS | Status: AC
  Administered 2020-01-20: 500 [IU] via INTRAVENOUS
  Filled 2020-01-20: qty 5

## 2020-01-20 MED ORDER — SODIUM CHLORIDE 0.9% FLUSH
10.0000 mL | INTRAVENOUS | Status: DC | PRN
  Administered 2020-01-20: 10 mL via INTRAVENOUS
  Filled 2020-01-20: qty 10

## 2020-01-20 NOTE — Telephone Encounter (Signed)
-----   Message from Owens Shark, NP sent at 01/20/2020 11:03 AM EDT ----- Please let her know magnesium is in normal range.  Continue magnesium as she is currently taking.

## 2020-01-20 NOTE — Telephone Encounter (Signed)
Patient made aware magnesium level normal range cont' current tx plan

## 2020-01-20 NOTE — Telephone Encounter (Signed)
Called spoke with pt relayed to her cdiff results and recommendations as well as to call if anything changes or worsens patient expressed understanding of instructions

## 2020-01-20 NOTE — Telephone Encounter (Signed)
-----   Message from Owens Shark, NP sent at 01/20/2020  1:00 PM EDT ----- Please let her know the C. difficile testing was negative.  For the diarrhea she should take 2 Imodium after the first loose stool and then 1 Imodium after each additional loose stool, maximum 8 tablets a day.  Contact the office if this is not effective and we can try Lomotil.

## 2020-01-20 NOTE — Telephone Encounter (Signed)
Treatment for today cancelled per Np pt de-accessed

## 2020-01-20 NOTE — Progress Notes (Signed)
[place] OFFICE PROGRESS NOTE   Diagnosis: Colon cancer  INTERVAL HISTORY:   Ms. Denise returns as scheduled.  She completed cycle 7 FOLFOX 01/06/2020.  She denies nausea/vomiting.  No mouth sores.  She began having loose stools at the beginning of this week.  She estimates 5+ per day.  When she urinates she also has a loose bowel movement.  She has not noticed any blood.  No fever.  One episode of abdominal pain.  She has persistent cold sensitivity.  No numbness or tingling in the absence of cold exposure.  Objective:  Vital signs in last 24 hours:  Blood pressure (!) 150/75, pulse 73, temperature (!) 97.2 F (36.2 C), temperature source Temporal, resp. rate 17, height 5' 2.5" (1.588 m), weight 156 lb 11.2 oz (71.1 kg), SpO2 100 %.    HEENT: No thrush or ulcers. Resp: Lungs clear bilaterally. Cardio: Regular rate and rhythm. GI: Abdomen soft and nontender.  Fullness right upper abdomen. Vascular: No leg edema. Neuro: Vibratory sense intact over the fingertips per tuning fork exam. Skin: Palms without erythema. Port-A-Cath without erythema.   Lab Results:  Lab Results  Component Value Date   WBC 8.7 01/20/2020   HGB 10.4 (L) 01/20/2020   HCT 32.6 (L) 01/20/2020   MCV 83.0 01/20/2020   PLT 218 01/20/2020   NEUTROABS 6.1 01/20/2020    Imaging:  No results found.  Medications: I have reviewed the patient's current medications.  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 She presented with a large cecal mass, extensive hepatic metastatic disease; mild thoracic and abdominal adenopathy. She also has several small nonspecific pulmonary nodules. She was diagnosed in March 2021.   Colonoscopy 09/16/2019-fungating mass at the cecum, sessile polyps in the this sigmoid, transverse colon, and hepatic flexure, adenocarcinoma involving cecum biopsy, tubular adenoma and sessile serrated polyps  CTs chest, abdomen, and pelvis 10/08/2019-extensive  liver metastases, thoracic/abdominal adenopathy, cecal mass, dilated and thin thickened appendix, nonspecific pulmonary nodules  Ultrasound-guided liver biopsy 10/13/2019-adenocarcinoma consistent with a metastatic colorectal primary, MSS, tumor mutation burden 3, KRAS Q61,  Cycle 1 FOLFOX 10/14/2019  Cycle 2 FOLFOX 10/27/2019  Cycle 3 FOLFOX 11/11/2019, Emend and Decadron added, 5-FU dose escalated  Cycle 4 FOLFOX 11/25/2019, Udenyca added  Cycle 5 FOLFOX 12/08/2019  Cycle 6 FOLFOX 12/23/2019  CT 01/04/2020-decreased size of multiple hepatic metastases, slight decrease in bulk of cecal mass, stable ileocecal adenopathy  Cycle 7 FOLFOX 01/06/2020 2) Iron deficiency anemia secondary to #1. 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  Diarrhea 01/20/2020.  C. difficile test pending.  Chemotherapy held.    Disposition: Ms. Heatwole appears stable.  She has completed 7 cycles of FOLFOX.  She is scheduled for cycle 8 today.  We decided to hold today's chemotherapy due to recent onset of diarrhea.  We will reschedule the next chemotherapy for 1 week.  She will submit a stool sample for C. difficile testing.  She will return for lab, follow-up, FOLFOX in 1 week.  She will contact the office in the interim with any problems.  We will contact her once the C. difficile test result is available.  Plan reviewed with Dr. Benay Spice.  Ned Card ANP/GNP-BC   01/20/2020  9:22 AM

## 2020-01-26 ENCOUNTER — Telehealth: Payer: Self-pay | Admitting: Oncology

## 2020-01-26 NOTE — Telephone Encounter (Signed)
Called pt to confirm app on 7/19 - per 7/8 los - unable to schedule for 7/15 pt is aware of appt.

## 2020-01-31 ENCOUNTER — Inpatient Hospital Stay: Payer: 59

## 2020-01-31 ENCOUNTER — Other Ambulatory Visit: Payer: Self-pay

## 2020-01-31 ENCOUNTER — Inpatient Hospital Stay (HOSPITAL_BASED_OUTPATIENT_CLINIC_OR_DEPARTMENT_OTHER): Payer: 59 | Admitting: Oncology

## 2020-01-31 VITALS — BP 152/81 | HR 72 | Temp 97.6°F | Resp 17 | Ht 62.5 in | Wt 155.5 lb

## 2020-01-31 DIAGNOSIS — C19 Malignant neoplasm of rectosigmoid junction: Secondary | ICD-10-CM

## 2020-01-31 LAB — CBC WITH DIFFERENTIAL (CANCER CENTER ONLY)
Abs Immature Granulocytes: 0 10*3/uL (ref 0.00–0.07)
Basophils Absolute: 0 10*3/uL (ref 0.0–0.1)
Basophils Relative: 1 %
Eosinophils Absolute: 0.1 10*3/uL (ref 0.0–0.5)
Eosinophils Relative: 2 %
HCT: 31.5 % — ABNORMAL LOW (ref 36.0–46.0)
Hemoglobin: 9.9 g/dL — ABNORMAL LOW (ref 12.0–15.0)
Immature Granulocytes: 0 %
Lymphocytes Relative: 40 %
Lymphs Abs: 1.7 10*3/uL (ref 0.7–4.0)
MCH: 26.5 pg (ref 26.0–34.0)
MCHC: 31.4 g/dL (ref 30.0–36.0)
MCV: 84.5 fL (ref 80.0–100.0)
Monocytes Absolute: 0.5 10*3/uL (ref 0.1–1.0)
Monocytes Relative: 12 %
Neutro Abs: 2 10*3/uL (ref 1.7–7.7)
Neutrophils Relative %: 45 %
Platelet Count: 293 10*3/uL (ref 150–400)
RBC: 3.73 MIL/uL — ABNORMAL LOW (ref 3.87–5.11)
RDW: 18.1 % — ABNORMAL HIGH (ref 11.5–15.5)
WBC Count: 4.2 10*3/uL (ref 4.0–10.5)
nRBC: 0 % (ref 0.0–0.2)

## 2020-01-31 LAB — CMP (CANCER CENTER ONLY)
ALT: 17 U/L (ref 0–44)
AST: 27 U/L (ref 15–41)
Albumin: 3.1 g/dL — ABNORMAL LOW (ref 3.5–5.0)
Alkaline Phosphatase: 340 U/L — ABNORMAL HIGH (ref 38–126)
Anion gap: 8 (ref 5–15)
BUN: 9 mg/dL (ref 6–20)
CO2: 27 mmol/L (ref 22–32)
Calcium: 9.7 mg/dL (ref 8.9–10.3)
Chloride: 102 mmol/L (ref 98–111)
Creatinine: 0.63 mg/dL (ref 0.44–1.00)
GFR, Est AFR Am: >60 mL/min (ref >60)
GFR, Estimated: >60 mL/min (ref >60)
Glucose, Bld: 83 mg/dL (ref 70–99)
Potassium: 3.9 mmol/L (ref 3.5–5.1)
Sodium: 137 mmol/L (ref 135–145)
Total Bilirubin: 0.5 mg/dL (ref 0.3–1.2)
Total Protein: 8.5 g/dL — ABNORMAL HIGH (ref 6.5–8.1)

## 2020-01-31 LAB — MAGNESIUM: Magnesium: 1.7 mg/dL (ref 1.7–2.4)

## 2020-01-31 MED ORDER — OXALIPLATIN CHEMO INJECTION 100 MG/20ML
85.0000 mg/m2 | Freq: Once | INTRAVENOUS | Status: AC
  Administered 2020-01-31: 155 mg via INTRAVENOUS
  Filled 2020-01-31: qty 31

## 2020-01-31 MED ORDER — SODIUM CHLORIDE 0.9 % IV SOLN
10.0000 mg | Freq: Once | INTRAVENOUS | Status: AC
  Administered 2020-01-31: 10 mg via INTRAVENOUS
  Filled 2020-01-31: qty 1
  Filled 2020-01-31: qty 10

## 2020-01-31 MED ORDER — SODIUM CHLORIDE 0.9 % IV SOLN
1600.0000 mg/m2 | INTRAVENOUS | Status: DC
  Administered 2020-01-31: 2900 mg via INTRAVENOUS
  Filled 2020-01-31: qty 58

## 2020-01-31 MED ORDER — DEXTROSE 5 % IV SOLN
Freq: Once | INTRAVENOUS | Status: AC
  Filled 2020-01-31: qty 250

## 2020-01-31 MED ORDER — SODIUM CHLORIDE 0.9 % IV SOLN
150.0000 mg | Freq: Once | INTRAVENOUS | Status: AC
  Administered 2020-01-31: 150 mg via INTRAVENOUS
  Filled 2020-01-31: qty 150
  Filled 2020-01-31: qty 5

## 2020-01-31 MED ORDER — PALONOSETRON HCL INJECTION 0.25 MG/5ML
0.2500 mg | Freq: Once | INTRAVENOUS | Status: AC
  Administered 2020-01-31: 0.25 mg via INTRAVENOUS

## 2020-01-31 MED ORDER — LEUCOVORIN CALCIUM INJECTION 350 MG
300.0000 mg/m2 | Freq: Once | INTRAVENOUS | Status: AC
  Administered 2020-01-31: 544 mg via INTRAVENOUS
  Filled 2020-01-31: qty 27.2

## 2020-01-31 MED ORDER — PALONOSETRON HCL INJECTION 0.25 MG/5ML
INTRAVENOUS | Status: AC
  Filled 2020-01-31: qty 5

## 2020-01-31 NOTE — Progress Notes (Signed)
[facility] OFFICE PROGRESS NOTE   Diagnosis: Colon cancer  INTERVAL HISTORY:   Ms. Koenig returns as scheduled.  Diarrhea has improved.  She continues to have approximately 3 bowel movements per day.  She is taking Imodium.  No other complaint.  She has persistent mild right abdominal discomfort.  No neuropathy symptoms.  Objective:  Vital signs in last 24 hours:  Blood pressure (!) 152/81, pulse 72, temperature 97.6 F (36.4 C), temperature source Temporal, resp. rate 17, height 5' 2.5" (1.588 m), weight 155 lb 8 oz (70.5 kg), SpO2 100 %.    HEENT: No thrush or ulcers Resp: Lungs clear bilaterally Cardio: Regular rate and rhythm GI: Fullness in the right upper abdomen without a discrete liver edge, nontender Vascular: No leg edema Neuro: Mild loss of vibratory sense of the fingertips bilaterally There is good I look at Mr. Romie Levee he was platelets are  Portacath/PICC-without erythema  Lab Results:  Lab Results  Component Value Date   WBC 4.2 01/31/2020   HGB 9.9 (L) 01/31/2020   HCT 31.5 (L) 01/31/2020   MCV 84.5 01/31/2020   PLT 293 01/31/2020   NEUTROABS 2.0 01/31/2020    CMP  Lab Results  Component Value Date   NA 137 01/31/2020   K 3.9 01/31/2020   CL 102 01/31/2020   CO2 27 01/31/2020   GLUCOSE 83 01/31/2020   BUN 9 01/31/2020   CREATININE 0.63 01/31/2020   CALCIUM 9.7 01/31/2020   PROT 8.5 (H) 01/31/2020   ALBUMIN 3.1 (L) 01/31/2020   AST 27 01/31/2020   ALT 17 01/31/2020   ALKPHOS 340 (H) 01/31/2020   BILITOT 0.5 01/31/2020   GFRNONAA >60 01/31/2020   GFRAA >60 01/31/2020    Lab Results  Component Value Date   CEA1 92,426.83 (H) 01/20/2020     Medications: I have reviewed the patient's current medications.   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 She presented with a large cecal mass, extensive hepatic metastatic disease; mild thoracic and abdominal adenopathy. She also has several small nonspecific  pulmonary nodules. She was diagnosed in March 2021.   Colonoscopy 09/16/2019-fungating mass at the cecum, sessile polyps in the this sigmoid, transverse colon, and hepatic flexure, adenocarcinoma involving cecum biopsy, tubular adenoma and sessile serrated polyps  CTs chest, abdomen, and pelvis 10/08/2019-extensive liver metastases, thoracic/abdominal adenopathy, cecal mass, dilated and thin thickened appendix, nonspecific pulmonary nodules  Ultrasound-guided liver biopsy 10/13/2019-adenocarcinoma consistent with a metastatic colorectal primary, MSS, tumor mutation burden 3, KRAS Q61,  Cycle 1 FOLFOX 10/14/2019  Cycle 2 FOLFOX 10/27/2019  Cycle 3 FOLFOX 11/11/2019, Emend and Decadron added, 5-FU dose escalated  Cycle 4 FOLFOX 11/25/2019, Udenyca added  Cycle 5 FOLFOX 12/08/2019  Cycle 6 FOLFOX 12/23/2019  CT 01/04/2020-decreased size of multiple hepatic metastases, slight decrease in bulk of cecal mass, stable ileocecal adenopathy  Cycle 7 FOLFOX 01/06/2020  Cycle 8 FOLFOX 01/31/2020 2) Iron deficiency anemia secondary to #1. 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  Diarrhea 01/20/2020.  C. difficile negative     Disposition: Ms. Biel appears stable.  She has persistent mild diarrhea, improved with Imodium.  The diarrhea may be related to the colon tumor, polypharmacy, or chemotherapy.  She will continue Imodium.  The plan is to proceed with FOLFOX today.  I will reduce the 5-FU dose.  Ms. Enamorado will  return for an office visit in the next cycle of FOLFOX on 02/17/2020.  The plan is to complete 10 cycles of FOLFOX prior to a restaging CT.  We will monitor for progressive neuropathy symptoms and discontinue oxaliplatin as indicated.  Betsy Coder, MD  01/31/2020  9:23 AM

## 2020-01-31 NOTE — Patient Instructions (Signed)
[facility] Discharge Instructions for Patients Receiving Chemotherapy  Today you received the following chemotherapy agents Oxaliplatin, Leucovorin and Adrucil.  To help prevent nausea and vomiting after your treatment, we encourage you to take your nausea medication as directed BUT NO ZOFRAN FOR 3 DAYS    If you develop nausea and vomiting that is not controlled by your nausea medication, call the clinic.   BELOW ARE SYMPTOMS THAT SHOULD BE REPORTED IMMEDIATELY:  *FEVER GREATER THAN 100.5 F  *CHILLS WITH OR WITHOUT FEVER  NAUSEA AND VOMITING THAT IS NOT CONTROLLED WITH YOUR NAUSEA MEDICATION  *UNUSUAL SHORTNESS OF BREATH  *UNUSUAL BRUISING OR BLEEDING  TENDERNESS IN MOUTH AND THROAT WITH OR WITHOUT PRESENCE OF ULCERS  *URINARY PROBLEMS  *BOWEL PROBLEMS  UNUSUAL RASH Items with * indicate a potential emergency and should be followed up as soon as possible.  Feel free to call the clinic you have any questions or concerns. The clinic phone number is [phone].  Please show the [facility] at check-in to the Emergency Department and triage nurse.

## 2020-02-01 ENCOUNTER — Telehealth: Payer: Self-pay | Admitting: Oncology

## 2020-02-01 NOTE — Telephone Encounter (Signed)
Scheduled appts per 7/19 los. Pt to get updated appt calendar at next visit per appt notes.

## 2020-02-01 NOTE — Telephone Encounter (Signed)
Cancelled 7/22 appts per 7/20 voicemail. Called pt to confirm cancellation and appts made per 7/19 los.

## 2020-02-02 ENCOUNTER — Inpatient Hospital Stay: Payer: 59

## 2020-02-02 ENCOUNTER — Other Ambulatory Visit: Payer: Self-pay

## 2020-02-02 VITALS — BP 163/78 | HR 70 | Temp 98.5°F | Resp 16

## 2020-02-02 DIAGNOSIS — C19 Malignant neoplasm of rectosigmoid junction: Secondary | ICD-10-CM

## 2020-02-02 MED ORDER — SODIUM CHLORIDE 0.9% FLUSH
10.0000 mL | INTRAVENOUS | Status: DC | PRN
  Administered 2020-02-02: 10 mL
  Filled 2020-02-02: qty 10

## 2020-02-02 MED ORDER — PEGFILGRASTIM-CBQV 6 MG/0.6ML ~~LOC~~ SOSY
6.0000 mg | PREFILLED_SYRINGE | Freq: Once | SUBCUTANEOUS | Status: AC
  Administered 2020-02-02: 6 mg via SUBCUTANEOUS

## 2020-02-02 MED ORDER — HEPARIN SOD (PORK) LOCK FLUSH 100 UNIT/ML IV SOLN
500.0000 [IU] | Freq: Once | INTRAVENOUS | Status: AC | PRN
  Administered 2020-02-02: 500 [IU]
  Filled 2020-02-02: qty 5

## 2020-02-02 MED ORDER — PEGFILGRASTIM-CBQV 6 MG/0.6ML ~~LOC~~ SOSY
PREFILLED_SYRINGE | SUBCUTANEOUS | Status: AC
  Filled 2020-02-02: qty 0.6

## 2020-02-02 NOTE — Patient Instructions (Signed)

## 2020-02-03 ENCOUNTER — Ambulatory Visit: Payer: 59 | Admitting: Oncology

## 2020-02-03 ENCOUNTER — Other Ambulatory Visit: Payer: 59

## 2020-02-03 ENCOUNTER — Ambulatory Visit: Payer: 59

## 2020-02-13 ENCOUNTER — Other Ambulatory Visit: Payer: Self-pay | Admitting: Oncology

## 2020-02-17 ENCOUNTER — Other Ambulatory Visit: Payer: Self-pay

## 2020-02-17 ENCOUNTER — Inpatient Hospital Stay (HOSPITAL_BASED_OUTPATIENT_CLINIC_OR_DEPARTMENT_OTHER): Payer: 59 | Admitting: Nurse Practitioner

## 2020-02-17 ENCOUNTER — Encounter: Payer: Self-pay | Admitting: Nurse Practitioner

## 2020-02-17 ENCOUNTER — Inpatient Hospital Stay: Payer: 59

## 2020-02-17 ENCOUNTER — Inpatient Hospital Stay: Payer: 59 | Attending: Physician Assistant

## 2020-02-17 VITALS — BP 144/83 | HR 65 | Temp 97.6°F | Resp 17 | Ht 62.5 in | Wt 156.0 lb

## 2020-02-17 DIAGNOSIS — C19 Malignant neoplasm of rectosigmoid junction: Secondary | ICD-10-CM

## 2020-02-17 DIAGNOSIS — R197 Diarrhea, unspecified: Secondary | ICD-10-CM | POA: Insufficient documentation

## 2020-02-17 DIAGNOSIS — Z5189 Encounter for other specified aftercare: Secondary | ICD-10-CM | POA: Insufficient documentation

## 2020-02-17 DIAGNOSIS — C180 Malignant neoplasm of cecum: Secondary | ICD-10-CM | POA: Insufficient documentation

## 2020-02-17 DIAGNOSIS — D509 Iron deficiency anemia, unspecified: Secondary | ICD-10-CM | POA: Insufficient documentation

## 2020-02-17 DIAGNOSIS — R110 Nausea: Secondary | ICD-10-CM | POA: Insufficient documentation

## 2020-02-17 DIAGNOSIS — G893 Neoplasm related pain (acute) (chronic): Secondary | ICD-10-CM | POA: Insufficient documentation

## 2020-02-17 DIAGNOSIS — Z95828 Presence of other vascular implants and grafts: Secondary | ICD-10-CM

## 2020-02-17 DIAGNOSIS — Z5111 Encounter for antineoplastic chemotherapy: Secondary | ICD-10-CM | POA: Insufficient documentation

## 2020-02-17 DIAGNOSIS — C787 Secondary malignant neoplasm of liver and intrahepatic bile duct: Secondary | ICD-10-CM | POA: Insufficient documentation

## 2020-02-17 DIAGNOSIS — G620 Drug-induced polyneuropathy: Secondary | ICD-10-CM | POA: Insufficient documentation

## 2020-02-17 LAB — CBC WITH DIFFERENTIAL (CANCER CENTER ONLY)
Abs Immature Granulocytes: 0 10*3/uL (ref 0.00–0.07)
Basophils Absolute: 0 10*3/uL (ref 0.0–0.1)
Basophils Relative: 1 %
Eosinophils Absolute: 0.1 10*3/uL (ref 0.0–0.5)
Eosinophils Relative: 2 %
HCT: 33 % — ABNORMAL LOW (ref 36.0–46.0)
Hemoglobin: 10.6 g/dL — ABNORMAL LOW (ref 12.0–15.0)
Immature Granulocytes: 0 %
Lymphocytes Relative: 39 %
Lymphs Abs: 1.3 10*3/uL (ref 0.7–4.0)
MCH: 27.3 pg (ref 26.0–34.0)
MCHC: 32.1 g/dL (ref 30.0–36.0)
MCV: 85.1 fL (ref 80.0–100.0)
Monocytes Absolute: 0.4 10*3/uL (ref 0.1–1.0)
Monocytes Relative: 12 %
Neutro Abs: 1.5 10*3/uL — ABNORMAL LOW (ref 1.7–7.7)
Neutrophils Relative %: 46 %
Platelet Count: 202 10*3/uL (ref 150–400)
RBC: 3.88 MIL/uL (ref 3.87–5.11)
RDW: 18.5 % — ABNORMAL HIGH (ref 11.5–15.5)
WBC Count: 3.3 10*3/uL — ABNORMAL LOW (ref 4.0–10.5)
nRBC: 0 % (ref 0.0–0.2)

## 2020-02-17 LAB — CMP (CANCER CENTER ONLY)
ALT: 35 U/L (ref 0–44)
AST: 43 U/L — ABNORMAL HIGH (ref 15–41)
Albumin: 3.4 g/dL — ABNORMAL LOW (ref 3.5–5.0)
Alkaline Phosphatase: 439 U/L — ABNORMAL HIGH (ref 38–126)
Anion gap: 10 (ref 5–15)
BUN: 8 mg/dL (ref 8–23)
CO2: 25 mmol/L (ref 22–32)
Calcium: 10 mg/dL (ref 8.9–10.3)
Chloride: 104 mmol/L (ref 98–111)
Creatinine: 0.64 mg/dL (ref 0.44–1.00)
GFR, Est AFR Am: >60 mL/min (ref >60)
GFR, Estimated: >60 mL/min (ref >60)
Glucose, Bld: 85 mg/dL (ref 70–99)
Potassium: 3.7 mmol/L (ref 3.5–5.1)
Sodium: 139 mmol/L (ref 135–145)
Total Bilirubin: 0.5 mg/dL (ref 0.3–1.2)
Total Protein: 8 g/dL (ref 6.5–8.1)

## 2020-02-17 LAB — CEA (IN HOUSE-CHCC): CEA (CHCC-In House): 8952.21 ng/mL — ABNORMAL HIGH (ref 0.00–5.00)

## 2020-02-17 LAB — MAGNESIUM: Magnesium: 1.9 mg/dL (ref 1.7–2.4)

## 2020-02-17 MED ORDER — DEXAMETHASONE 4 MG PO TABS: 4.0000 mg | ORAL_TABLET | ORAL | 1 refills | Status: DC

## 2020-02-17 MED ORDER — SODIUM CHLORIDE 0.9 % IV SOLN
1600.0000 mg/m2 | INTRAVENOUS | Status: DC
  Administered 2020-02-17: 2900 mg via INTRAVENOUS
  Filled 2020-02-17: qty 58

## 2020-02-17 MED ORDER — SODIUM CHLORIDE 0.9 % IV SOLN
150.0000 mg | Freq: Once | INTRAVENOUS | Status: AC
  Administered 2020-02-17: 150 mg via INTRAVENOUS
  Filled 2020-02-17: qty 150

## 2020-02-17 MED ORDER — DEXTROSE 5 % IV SOLN
Freq: Once | INTRAVENOUS | Status: AC
  Filled 2020-02-17: qty 250

## 2020-02-17 MED ORDER — OXALIPLATIN CHEMO INJECTION 100 MG/20ML
65.0000 mg/m2 | Freq: Once | INTRAVENOUS | Status: AC
  Administered 2020-02-17: 120 mg via INTRAVENOUS
  Filled 2020-02-17: qty 20

## 2020-02-17 MED ORDER — DIPHENOXYLATE-ATROPINE 2.5-0.025 MG PO TABS: 1.0000 | ORAL_TABLET | Freq: Four times a day (QID) | ORAL | 0 refills | Status: DC | PRN

## 2020-02-17 MED ORDER — LEUCOVORIN CALCIUM INJECTION 350 MG
300.0000 mg/m2 | Freq: Once | INTRAVENOUS | Status: AC
  Administered 2020-02-17: 544 mg via INTRAVENOUS
  Filled 2020-02-17: qty 27.2

## 2020-02-17 MED ORDER — SODIUM CHLORIDE 0.9% FLUSH
10.0000 mL | INTRAVENOUS | Status: DC | PRN
  Administered 2020-02-17: 10 mL via INTRAVENOUS
  Filled 2020-02-17: qty 10

## 2020-02-17 MED ORDER — PALONOSETRON HCL INJECTION 0.25 MG/5ML
0.2500 mg | Freq: Once | INTRAVENOUS | Status: AC
  Administered 2020-02-17: 0.25 mg via INTRAVENOUS

## 2020-02-17 MED ORDER — SODIUM CHLORIDE 0.9 % IV SOLN
10.0000 mg | Freq: Once | INTRAVENOUS | Status: AC
  Administered 2020-02-17: 10 mg via INTRAVENOUS
  Filled 2020-02-17: qty 10

## 2020-02-17 MED ORDER — PALONOSETRON HCL INJECTION 0.25 MG/5ML
INTRAVENOUS | Status: AC
  Filled 2020-02-17: qty 5

## 2020-02-17 NOTE — Patient Instructions (Signed)

## 2020-02-17 NOTE — Progress Notes (Signed)
Oxaliplatin decreased to 65mg /m2 due to [place] being borderline and concerns about neuropathy per Ned Card instructions.   Hardie Pulley, PharmD, BCPS, BCOP

## 2020-02-17 NOTE — Patient Instructions (Signed)
[facility] Discharge Instructions for Patients Receiving Chemotherapy  Today you received the following chemotherapy agents Oxaliplatin, Leucovorin, 5FU  To help prevent nausea and vomiting after your treatment, we encourage you to take your nausea medication as directed   If you develop nausea and vomiting that is not controlled by your nausea medication, call the clinic.   BELOW ARE SYMPTOMS THAT SHOULD BE REPORTED IMMEDIATELY:  *FEVER GREATER THAN 100.5 F  *CHILLS WITH OR WITHOUT FEVER  NAUSEA AND VOMITING THAT IS NOT CONTROLLED WITH YOUR NAUSEA MEDICATION  *UNUSUAL SHORTNESS OF BREATH  *UNUSUAL BRUISING OR BLEEDING  TENDERNESS IN MOUTH AND THROAT WITH OR WITHOUT PRESENCE OF ULCERS  *URINARY PROBLEMS  *BOWEL PROBLEMS  UNUSUAL RASH Items with * indicate a potential emergency and should be followed up as soon as possible.  Feel free to call the clinic should you have any questions or concerns. The clinic phone number is [phone].  Please show the CHEMO ALERT CARD at check-in to the Emergency Department and triage nurse.   

## 2020-02-17 NOTE — Progress Notes (Signed)
[place] OFFICE PROGRESS NOTE   Diagnosis: Colon cancer  INTERVAL HISTORY:   Rachel Frank returns as scheduled.  She completed cycle 8 FOLFOX 01/31/2020.  The 5-FU was dose reduced due to diarrhea.  She has mild intermittent nausea.  No mouth sores.  No significant change in diarrhea.  She takes Imodium with no real change.  She has mild persistent cold sensitivity.  No numbness or tingling in the absence of cold exposure.  Objective:  Vital signs in last 24 hours:  Blood pressure (!) 144/83, pulse 65, temperature 97.6 F (36.4 C), temperature source Temporal, resp. rate 17, height 5' 2.5" (1.588 m), weight 156 lb (70.8 kg), SpO2 100 %.    HEENT: No thrush or ulcers. Resp: Lungs clear bilaterally. Cardio: Regular rate and rhythm. GI: Fullness right upper abdomen. Vascular: No leg edema. Neuro: Mild decrease in vibratory sense over the fingertips bilaterally. Skin: Palms without erythema. Port-A-Cath without erythema.  Lab Results:  Lab Results  Component Value Date   WBC 3.3 (L) 02/17/2020   HGB 10.6 (L) 02/17/2020   HCT 33.0 (L) 02/17/2020   MCV 85.1 02/17/2020   PLT 202 02/17/2020   NEUTROABS 1.5 (L) 02/17/2020    Imaging:  No results found.  Medications: I have reviewed the patient's current medications.  Assessment/Plan: 1.Stage IV adenocarcinoma of the cecum. She presented with a large cecal mass, extensive hepatic metastatic disease; mild thoracic and abdominal adenopathy. She also has several small nonspecific pulmonary nodules. She was diagnosed in March 2021.   Colonoscopy 09/16/2019-fungating mass at the cecum, sessile polyps in the this sigmoid, transverse colon, and hepatic flexure, adenocarcinoma involving cecum biopsy, tubular adenoma and sessile serrated polyps  CTs chest, abdomen, and pelvis 10/08/2019-extensive liver metastases, thoracic/abdominal adenopathy, cecal mass, dilated and thin thickened appendix, nonspecific pulmonary  nodules  Ultrasound-guided liver biopsy 10/13/2019-adenocarcinoma consistent with a metastatic colorectal primary, MSS, tumor mutation burden 3, KRAS Q61,  Cycle 1 FOLFOX 10/14/2019  Cycle 2 FOLFOX 10/27/2019  Cycle 3 FOLFOX 11/11/2019, Emend and Decadron added, 5-FU dose escalated  Cycle 4 FOLFOX 11/25/2019, Udenyca added  Cycle 5 FOLFOX 12/08/2019  Cycle 6 FOLFOX 12/23/2019  CT 01/04/2020-decreased size of multiple hepatic metastases, slight decrease in bulk of cecal mass, stable ileocecal adenopathy  Cycle 7 FOLFOX 01/06/2020  Cycle 8 FOLFOX 01/31/2020  Cycle 9 FOLFOX 02/17/2020 (oxaliplatin dose reduced due to borderline ANC, persistent cold sensitivity) 2) Iron deficiency anemia secondary to #1. 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  Diarrhea 01/20/2020.  C. difficile negative    Disposition: [name] appears stable.  She has completed 8 cycles of FOLFOX.  Plan to proceed with cycle 9 today as scheduled.  The oxaliplatin will be dose reduced to 65 mg/m due to the borderline ANC and persistent cold sensitivity.  She will receive Udenyca on the day of pump discontinuation.  We discussed holding oxaliplatin with today's treatment which she prefers not to do.  She understands the increased risk of infection and to contact the office with fever, chills, other signs of infection.  She understands the potential for worsening of current neuropathy symptoms and is willing to accept this risk.  For the diarrhea she will try Lomotil 1 tablet 4 times daily as needed.  She will return for lab, follow-up, FOLFOX in 2 weeks.  She will contact the office in the  interim as outlined above or with any other problems.    Ned Card ANP/GNP-BC   02/17/2020  10:29 AM

## 2020-02-18 ENCOUNTER — Telehealth: Payer: Self-pay | Admitting: Oncology

## 2020-02-18 NOTE — Telephone Encounter (Signed)
Scheduled per 08/05 los, patient has been called and notified. 

## 2020-02-19 ENCOUNTER — Other Ambulatory Visit: Payer: Self-pay

## 2020-02-19 ENCOUNTER — Inpatient Hospital Stay: Payer: 59

## 2020-02-19 VITALS — BP 175/82 | HR 54 | Temp 98.4°F | Resp 18

## 2020-02-19 DIAGNOSIS — C19 Malignant neoplasm of rectosigmoid junction: Secondary | ICD-10-CM

## 2020-02-19 MED ORDER — SODIUM CHLORIDE 0.9% FLUSH
10.0000 mL | INTRAVENOUS | Status: DC | PRN
  Administered 2020-02-19: 10 mL
  Filled 2020-02-19: qty 10

## 2020-02-19 MED ORDER — PEGFILGRASTIM-CBQV 6 MG/0.6ML ~~LOC~~ SOSY
6.0000 mg | PREFILLED_SYRINGE | Freq: Once | SUBCUTANEOUS | Status: AC
  Administered 2020-02-19: 6 mg via SUBCUTANEOUS

## 2020-02-19 MED ORDER — HEPARIN SOD (PORK) LOCK FLUSH 100 UNIT/ML IV SOLN
500.0000 [IU] | Freq: Once | INTRAVENOUS | Status: AC | PRN
  Administered 2020-02-19: 500 [IU]
  Filled 2020-02-19: qty 5

## 2020-02-19 NOTE — Patient Instructions (Signed)
Pegfilgrastim injection What is this medicine? PEGFILGRASTIM (PEG fil gra stim) is a long-acting granulocyte colony-stimulating factor that stimulates the growth of neutrophils, a type of white blood cell important in the body's fight against infection. It is used to reduce the incidence of fever and infection in patients with certain types of cancer who are receiving chemotherapy that affects the bone marrow, and to increase survival after being exposed to high doses of radiation. This medicine may be used for other purposes; ask your health care provider or pharmacist if you have questions. COMMON BRAND NAME(S): Fulphila, Neulasta, UDENYCA, Ziextenzo What should I tell my health care provider before I take this medicine? They need to know if you have any of these conditions:  kidney disease  latex allergy  ongoing radiation therapy  sickle cell disease  skin reactions to acrylic adhesives (On-Body Injector only)  an unusual or allergic reaction to pegfilgrastim, filgrastim, other medicines, foods, dyes, or preservatives  pregnant or trying to get pregnant  breast-feeding How should I use this medicine? This medicine is for injection under the skin. If you get this medicine at home, you will be taught how to prepare and give the pre-filled syringe or how to use the On-body Injector. Refer to the patient Instructions for Use for detailed instructions. Use exactly as directed. Tell your healthcare provider immediately if you suspect that the On-body Injector may not have performed as intended or if you suspect the use of the On-body Injector resulted in a missed or partial dose. It is important that you put your used needles and syringes in a special sharps container. Do not put them in a trash can. If you do not have a sharps container, call your pharmacist or healthcare provider to get one. Talk to your pediatrician regarding the use of this medicine in children. While this drug may be  prescribed for selected conditions, precautions do apply. Overdosage: If you think you have taken too much of this medicine contact a poison control center or emergency room at once. NOTE: This medicine is only for you. Do not share this medicine with others. What if I miss a dose? It is important not to miss your dose. Call your doctor or health care professional if you miss your dose. If you miss a dose due to an On-body Injector failure or leakage, a new dose should be administered as soon as possible using a single prefilled syringe for manual use. What may interact with this medicine? Interactions have not been studied. Give your health care provider a list of all the medicines, herbs, non-prescription drugs, or dietary supplements you use. Also tell them if you smoke, drink alcohol, or use illegal drugs. Some items may interact with your medicine. This list may not describe all possible interactions. Give your health care provider a list of all the medicines, herbs, non-prescription drugs, or dietary supplements you use. Also tell them if you smoke, drink alcohol, or use illegal drugs. Some items may interact with your medicine. What should I watch for while using this medicine? You may need blood work done while you are taking this medicine. If you are going to need a MRI, CT scan, or other procedure, tell your doctor that you are using this medicine (On-Body Injector only). What side effects may I notice from receiving this medicine? Side effects that you should report to your doctor or health care professional as soon as possible:  allergic reactions like skin rash, itching or hives, swelling of the   face, lips, or tongue  back pain  dizziness  fever  pain, redness, or irritation at site where injected  pinpoint red spots on the skin  red or dark-brown urine  shortness of breath or breathing problems  stomach or side pain, or pain at the  shoulder  swelling  tiredness  trouble passing urine or change in the amount of urine Side effects that usually do not require medical attention (report to your doctor or health care professional if they continue or are bothersome):  bone pain  muscle pain This list may not describe all possible side effects. Call your doctor for medical advice about side effects. You may report side effects to FDA at 1-800-FDA-1088. Where should I keep my medicine? Keep out of the reach of children. If you are using this medicine at home, you will be instructed on how to store it. Throw away any unused medicine after the expiration date on the label. NOTE: This sheet is a summary. It may not cover all possible information. If you have questions about this medicine, talk to your doctor, pharmacist, or health care provider.  2020 Elsevier/Gold Standard (2017-10-06 16:57:08) Implanted Port Home Guide An implanted port is a device that is placed under the skin. It is usually placed in the chest. The device can be used to give IV medicine, to take blood, or for dialysis. You may have an implanted port if:  You need IV medicine that would be irritating to the small veins in your hands or arms.  You need IV medicines, such as antibiotics, for a long period of time.  You need IV nutrition for a long period of time.  You need dialysis. Having a port means that your health care provider will not need to use the veins in your arms for these procedures. You may have fewer limitations when using a port than you would if you used other types of long-term IVs, and you will likely be able to return to normal activities after your incision heals. An implanted port has two main parts:  Reservoir. The reservoir is the part where a needle is inserted to give medicines or draw blood. The reservoir is round. After it is placed, it appears as a small, raised area under your skin.  Catheter. The catheter is a thin,  flexible tube that connects the reservoir to a vein. Medicine that is inserted into the reservoir goes into the catheter and then into the vein. How is my port accessed? To access your port:  A numbing cream may be placed on the skin over the port site.  Your health care provider will put on a mask and sterile gloves.  The skin over your port will be cleaned carefully with a germ-killing soap and allowed to dry.  Your health care provider will gently pinch the port and insert a needle into it.  Your health care provider will check for a blood return to make sure the port is in the vein and is not clogged.  If your port needs to remain accessed to get medicine continuously (constant infusion), your health care provider will place a clear bandage (dressing) over the needle site. The dressing and needle will need to be changed every week, or as told by your health care provider. What is flushing? Flushing helps keep the port from getting clogged. Follow instructions from your health care provider about how and when to flush the port. Ports are usually flushed with saline solution or a medicine   called heparin. The need for flushing will depend on how the port is used:  If the port is only used from time to time to give medicines or draw blood, the port may need to be flushed: ? Before and after medicines have been given. ? Before and after blood has been drawn. ? As part of routine maintenance. Flushing may be recommended every 4-6 weeks.  If a constant infusion is running, the port may not need to be flushed.  Throw away any syringes in a disposal container that is meant for sharp items (sharps container). You can buy a sharps container from a pharmacy, or you can make one by using an empty hard plastic bottle with a cover. How long will my port stay implanted? The port can stay in for as long as your health care provider thinks it is needed. When it is time for the port to come out, a  surgery will be done to remove it. The surgery will be similar to the procedure that was done to put the port in. Follow these instructions at home:   Flush your port as told by your health care provider.  If you need an infusion over several days, follow instructions from your health care provider about how to take care of your port site. Make sure you: ? Wash your hands with soap and water before you change your dressing. If soap and water are not available, use alcohol-based hand sanitizer. ? Change your dressing as told by your health care provider. ? Place any used dressings or infusion bags into a plastic bag. Throw that bag in the trash. ? Keep the dressing that covers the needle clean and dry. Do not get it wet. ? Do not use scissors or sharp objects near the tube. ? Keep the tube clamped, unless it is being used.  Check your port site every day for signs of infection. Check for: ? Redness, swelling, or pain. ? Fluid or blood. ? Pus or a bad smell.  Protect the skin around the port site. ? Avoid wearing bra straps that rub or irritate the site. ? Protect the skin around your port from seat belts. Place a soft pad over your chest if needed.  Bathe or shower as told by your health care provider. The site may get wet as long as you are not actively receiving an infusion.  Return to your normal activities as told by your health care provider. Ask your health care provider what activities are safe for you.  Carry a medical alert card or wear a medical alert bracelet at all times. This will let health care providers know that you have an implanted port in case of an emergency. Get help right away if:  You have redness, swelling, or pain at the port site.  You have fluid or blood coming from your port site.  You have pus or a bad smell coming from the port site.  You have a fever. Summary  Implanted ports are usually placed in the chest for long-term IV access.  Follow  instructions from your health care provider about flushing the port and changing bandages (dressings).  Take care of the area around your port by avoiding clothing that puts pressure on the area, and by watching for signs of infection.  Protect the skin around your port from seat belts. Place a soft pad over your chest if needed.  Get help right away if you have a fever or you have redness,   swelling, pain, drainage, or a bad smell at the port site. This information is not intended to replace advice given to you by your health care provider. Make sure you discuss any questions you have with your health care provider. Document Revised: 10/23/2018 Document Reviewed: 08/03/2016 Elsevier Patient Education  2020 Elsevier Inc.  

## 2020-02-27 ENCOUNTER — Other Ambulatory Visit: Payer: Self-pay | Admitting: Oncology

## 2020-03-02 ENCOUNTER — Inpatient Hospital Stay (HOSPITAL_BASED_OUTPATIENT_CLINIC_OR_DEPARTMENT_OTHER): Payer: 59 | Admitting: Oncology

## 2020-03-02 ENCOUNTER — Inpatient Hospital Stay: Payer: 59

## 2020-03-02 ENCOUNTER — Other Ambulatory Visit: Payer: Self-pay | Admitting: *Deleted

## 2020-03-02 ENCOUNTER — Other Ambulatory Visit: Payer: Self-pay

## 2020-03-02 ENCOUNTER — Telehealth: Payer: Self-pay | Admitting: Oncology

## 2020-03-02 VITALS — BP 151/87 | HR 65 | Temp 97.6°F | Resp 16 | Ht 62.5 in | Wt 155.5 lb

## 2020-03-02 DIAGNOSIS — C19 Malignant neoplasm of rectosigmoid junction: Secondary | ICD-10-CM

## 2020-03-02 DIAGNOSIS — Z23 Encounter for immunization: Secondary | ICD-10-CM

## 2020-03-02 DIAGNOSIS — Z95828 Presence of other vascular implants and grafts: Secondary | ICD-10-CM

## 2020-03-02 LAB — CBC WITH DIFFERENTIAL (CANCER CENTER ONLY)
Abs Immature Granulocytes: 0.02 10*3/uL (ref 0.00–0.07)
Basophils Absolute: 0 10*3/uL (ref 0.0–0.1)
Basophils Relative: 1 %
Eosinophils Absolute: 0.1 10*3/uL (ref 0.0–0.5)
Eosinophils Relative: 2 %
HCT: 34.9 % — ABNORMAL LOW (ref 36.0–46.0)
Hemoglobin: 11.1 g/dL — ABNORMAL LOW (ref 12.0–15.0)
Immature Granulocytes: 1 %
Lymphocytes Relative: 36 %
Lymphs Abs: 1.6 10*3/uL (ref 0.7–4.0)
MCH: 27.3 pg (ref 26.0–34.0)
MCHC: 31.8 g/dL (ref 30.0–36.0)
MCV: 85.7 fL (ref 80.0–100.0)
Monocytes Absolute: 0.4 10*3/uL (ref 0.1–1.0)
Monocytes Relative: 8 %
Neutro Abs: 2.3 10*3/uL (ref 1.7–7.7)
Neutrophils Relative %: 52 %
Platelet Count: 194 10*3/uL (ref 150–400)
RBC: 4.07 MIL/uL (ref 3.87–5.11)
RDW: 17.5 % — ABNORMAL HIGH (ref 11.5–15.5)
WBC Count: 4.4 10*3/uL (ref 4.0–10.5)
nRBC: 0 % (ref 0.0–0.2)

## 2020-03-02 LAB — CMP (CANCER CENTER ONLY)
ALT: 31 U/L (ref 0–44)
AST: 37 U/L (ref 15–41)
Albumin: 3.4 g/dL — ABNORMAL LOW (ref 3.5–5.0)
Alkaline Phosphatase: 530 U/L — ABNORMAL HIGH (ref 38–126)
Anion gap: 8 (ref 5–15)
BUN: 10 mg/dL (ref 8–23)
CO2: 28 mmol/L (ref 22–32)
Calcium: 10.2 mg/dL (ref 8.9–10.3)
Chloride: 103 mmol/L (ref 98–111)
Creatinine: 0.65 mg/dL (ref 0.44–1.00)
GFR, Est AFR Am: >60 mL/min (ref >60)
GFR, Estimated: >60 mL/min (ref >60)
Glucose, Bld: 88 mg/dL (ref 70–99)
Potassium: 3.6 mmol/L (ref 3.5–5.1)
Sodium: 139 mmol/L (ref 135–145)
Total Bilirubin: 0.4 mg/dL (ref 0.3–1.2)
Total Protein: 8 g/dL (ref 6.5–8.1)

## 2020-03-02 MED ORDER — PALONOSETRON HCL INJECTION 0.25 MG/5ML
0.2500 mg | Freq: Once | INTRAVENOUS | Status: AC
  Administered 2020-03-02: 0.25 mg via INTRAVENOUS

## 2020-03-02 MED ORDER — HEPARIN SOD (PORK) LOCK FLUSH 100 UNIT/ML IV SOLN
500.0000 [IU] | Freq: Once | INTRAVENOUS | Status: DC | PRN
  Filled 2020-03-02: qty 5

## 2020-03-02 MED ORDER — PALONOSETRON HCL INJECTION 0.25 MG/5ML
INTRAVENOUS | Status: AC
  Filled 2020-03-02: qty 5

## 2020-03-02 MED ORDER — OXALIPLATIN CHEMO INJECTION 100 MG/20ML
65.0000 mg/m2 | Freq: Once | INTRAVENOUS | Status: AC
  Administered 2020-03-02: 120 mg via INTRAVENOUS
  Filled 2020-03-02: qty 10

## 2020-03-02 MED ORDER — DEXTROSE 5 % IV SOLN
Freq: Once | INTRAVENOUS | Status: AC
  Filled 2020-03-02: qty 250

## 2020-03-02 MED ORDER — SODIUM CHLORIDE 0.9 % IV SOLN
1600.0000 mg/m2 | INTRAVENOUS | Status: DC
  Administered 2020-03-02: 2900 mg via INTRAVENOUS
  Filled 2020-03-02: qty 58

## 2020-03-02 MED ORDER — SODIUM CHLORIDE 0.9% FLUSH
10.0000 mL | INTRAVENOUS | Status: DC | PRN
  Administered 2020-03-02: 10 mL via INTRAVENOUS
  Filled 2020-03-02: qty 10

## 2020-03-02 MED ORDER — SODIUM CHLORIDE 0.9 % IV SOLN
150.0000 mg | Freq: Once | INTRAVENOUS | Status: AC
  Administered 2020-03-02: 150 mg via INTRAVENOUS
  Filled 2020-03-02: qty 150

## 2020-03-02 MED ORDER — SODIUM CHLORIDE 0.9% FLUSH
10.0000 mL | INTRAVENOUS | Status: DC | PRN
  Filled 2020-03-02: qty 10

## 2020-03-02 MED ORDER — LIDOCAINE-PRILOCAINE 2.5-2.5 % EX CREA: 1.0000 "application " | TOPICAL_CREAM | CUTANEOUS | 2 refills | Status: DC | PRN

## 2020-03-02 MED ORDER — SODIUM CHLORIDE 0.9 % IV SOLN
10.0000 mg | Freq: Once | INTRAVENOUS | Status: AC
  Administered 2020-03-02: 10 mg via INTRAVENOUS
  Filled 2020-03-02: qty 10

## 2020-03-02 MED ORDER — LEUCOVORIN CALCIUM INJECTION 350 MG
300.0000 mg/m2 | Freq: Once | INTRAVENOUS | Status: AC
  Administered 2020-03-02: 544 mg via INTRAVENOUS
  Filled 2020-03-02: qty 27.2

## 2020-03-02 NOTE — Progress Notes (Signed)
Pt. received [employer] Booster shot today Left Deltoid. White card given with shot information.

## 2020-03-02 NOTE — Patient Instructions (Signed)

## 2020-03-02 NOTE — Progress Notes (Signed)
[facility] OFFICE PROGRESS NOTE   Diagnosis: Colon cancer  INTERVAL HISTORY:   Rachel Frank complete another cycle of FOLFOX on 02/17/2020. She reports mild diarrhea following chemotherapy. She has mild nausea, but no emesis. She has noted persistent mild cold sensitivity. No peripheral numbness or tingling. No difficulty performing activities. Abdominal pain remains improved.  Objective:  Vital signs in last 24 hours:  Blood pressure (!) 151/87, pulse 65, temperature 97.6 F (36.4 C), temperature source Tympanic, resp. rate 16, height 5' 2.5" (1.588 m), weight 155 lb 8 oz (70.5 kg), SpO2 100 %.    Resp: Lungs clear bilaterally Cardio: Regular rate and rhythm GI: The liver is palpable throughout the right upper abdomen Vascular: No leg edema Neuro: Very mild loss of vibratory sense at the fingertips bilaterally    Portacath/PICC-without erythema  Lab Results:  Lab Results  Component Value Date   WBC 4.4 03/02/2020   HGB 11.1 (L) 03/02/2020   HCT 34.9 (L) 03/02/2020   MCV 85.7 03/02/2020   PLT 194 03/02/2020   NEUTROABS 2.3 03/02/2020    CMP  Lab Results  Component Value Date   NA 139 02/17/2020   K 3.7 02/17/2020   CL 104 02/17/2020   CO2 25 02/17/2020   GLUCOSE 85 02/17/2020   BUN 8 02/17/2020   CREATININE 0.64 02/17/2020   CALCIUM 10.0 02/17/2020   PROT 8.0 02/17/2020   ALBUMIN 3.4 (L) 02/17/2020   AST 43 (H) 02/17/2020   ALT 35 02/17/2020   ALKPHOS 439 (H) 02/17/2020   BILITOT 0.5 02/17/2020   GFRNONAA >60 02/17/2020   GFRAA >60 02/17/2020    Lab Results  Component Value Date   CEA1 8,952.21 (H) 02/17/2020     Medications: I have reviewed the patient's current medications.   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 She presented with a large cecal mass, extensive hepatic metastatic disease; mild thoracic and abdominal adenopathy. She also has several small nonspecific pulmonary nodules. She was diagnosed in March  2021.   Colonoscopy 09/16/2019-fungating mass at the cecum, sessile polyps in the this sigmoid, transverse colon, and hepatic flexure, adenocarcinoma involving cecum biopsy, tubular adenoma and sessile serrated polyps  CTs chest, abdomen, and pelvis 10/08/2019-extensive liver metastases, thoracic/abdominal adenopathy, cecal mass, dilated and thin thickened appendix, nonspecific pulmonary nodules  Ultrasound-guided liver biopsy 10/13/2019-adenocarcinoma consistent with a metastatic colorectal primary, MSS, tumor mutation burden 3, KRAS Q61,  Cycle 1 FOLFOX 10/14/2019  Cycle 2 FOLFOX 10/27/2019  Cycle 3 FOLFOX 11/11/2019, Emend and Decadron added, 5-FU dose escalated  Cycle 4 FOLFOX 11/25/2019, Udenyca added  Cycle 5 FOLFOX 12/08/2019  Cycle 6 FOLFOX 12/23/2019  CT 01/04/2020-decreased size of multiple hepatic metastases, slight decrease in bulk of cecal mass, stable ileocecal adenopathy  Cycle 7 FOLFOX 01/06/2020  Cycle 8 FOLFOX 01/31/2020  Cycle 9 FOLFOX 02/17/2020 (oxaliplatin dose reduced due to borderline ANC, persistent cold sensitivity)  Cycle 10 FOLFOX 03/02/2020 2) Iron deficiency anemia secondary to #1. 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  Diarrhea 01/20/2020.  C. difficile negative      Disposition: Rachel Frank appears stable. She has completed 9 cycles of FOLFOX. Her clinical status has improved and the CEA is lower. She has mild oxaliplatin neuropathy manifest by mild cold sensitivity. She understands the chance of developing progressive neuropathy with further oxaliplatin. She would like to  proceed with another cycle of FOLFOX today.  Rachel Frank will return for an office visit in the next cycle of chemotherapy on 03/16/2020. She will be scheduled for a restaging CT  evaluation after cycle 11.  Rachel Frank will receive the COVID-19 booster vaccine today.  Betsy Coder, MD  03/02/2020  9:50 AM

## 2020-03-02 NOTE — Telephone Encounter (Signed)
Scheduled per los. Patient declined printout  

## 2020-03-02 NOTE — Progress Notes (Signed)
   Covid-19 Vaccination Clinic  Name:  Rachel Frank    MRN: [id number] DOB: Oct 18, 1960  03/02/2020  Rachel Frank was observed post Covid-19 immunization for 15 minutes without incident. She was provided with Vaccine Information Sheet and instruction to access the V-Safe system.   Rachel Frank was instructed to call 911 with any severe reactions post vaccine: Marland Kitchen Difficulty breathing  . Swelling of face and throat  . A fast heartbeat  . A bad rash all over body  . Dizziness and weakness   Immunizations Administered    Name Date Dose VIS Date Route   Pfizer COVID-19 Vaccine 03/02/2020 11:41 AM 0.3 mL 09/08/2018 Intramuscular   Manufacturer: [place]   Lot: [id number]   [place]: [id number]

## 2020-03-02 NOTE — Patient Instructions (Signed)
[facility] Discharge Instructions for Patients Receiving Chemotherapy  Today you received the following chemotherapy agents: Oxaliplatin, Leucovorin and Fluorouracil   To help prevent nausea and vomiting after your treatment, we encourage you to take your nausea medication as directed by your MD.   If you develop nausea and vomiting that is not controlled by your nausea medication, call the clinic.   BELOW ARE SYMPTOMS THAT SHOULD BE REPORTED IMMEDIATELY:  *FEVER GREATER THAN 100.5 F  *CHILLS WITH OR WITHOUT FEVER  NAUSEA AND VOMITING THAT IS NOT CONTROLLED WITH YOUR NAUSEA MEDICATION  *UNUSUAL SHORTNESS OF BREATH  *UNUSUAL BRUISING OR BLEEDING  TENDERNESS IN MOUTH AND THROAT WITH OR WITHOUT PRESENCE OF ULCERS  *URINARY PROBLEMS  *BOWEL PROBLEMS  UNUSUAL RASH Items with * indicate a potential emergency and should be followed up as soon as possible.  Feel free to call the clinic should you have any questions or concerns. The clinic phone number is [phone].  Please show the [facility] at check-in to the Emergency Department and triage nurse.

## 2020-03-04 ENCOUNTER — Other Ambulatory Visit: Payer: Self-pay

## 2020-03-04 ENCOUNTER — Inpatient Hospital Stay: Payer: 59

## 2020-03-04 VITALS — BP 160/72 | HR 64 | Temp 98.7°F | Resp 20

## 2020-03-04 DIAGNOSIS — C19 Malignant neoplasm of rectosigmoid junction: Secondary | ICD-10-CM

## 2020-03-04 MED ORDER — HEPARIN SOD (PORK) LOCK FLUSH 100 UNIT/ML IV SOLN
500.0000 [IU] | Freq: Once | INTRAVENOUS | Status: AC | PRN
  Administered 2020-03-04: 500 [IU]
  Filled 2020-03-04: qty 5

## 2020-03-04 MED ORDER — PEGFILGRASTIM-CBQV 6 MG/0.6ML ~~LOC~~ SOSY
6.0000 mg | PREFILLED_SYRINGE | Freq: Once | SUBCUTANEOUS | Status: AC
  Administered 2020-03-04: 6 mg via SUBCUTANEOUS

## 2020-03-04 MED ORDER — SODIUM CHLORIDE 0.9% FLUSH
10.0000 mL | INTRAVENOUS | Status: DC | PRN
  Administered 2020-03-04: 10 mL
  Filled 2020-03-04: qty 10

## 2020-03-04 NOTE — Patient Instructions (Signed)

## 2020-03-12 ENCOUNTER — Other Ambulatory Visit: Payer: Self-pay | Admitting: Oncology

## 2020-03-16 ENCOUNTER — Inpatient Hospital Stay: Payer: Self-pay

## 2020-03-16 ENCOUNTER — Inpatient Hospital Stay (HOSPITAL_BASED_OUTPATIENT_CLINIC_OR_DEPARTMENT_OTHER): Payer: 59 | Admitting: Oncology

## 2020-03-16 ENCOUNTER — Other Ambulatory Visit: Payer: Self-pay

## 2020-03-16 ENCOUNTER — Inpatient Hospital Stay: Payer: Self-pay | Attending: Physician Assistant

## 2020-03-16 ENCOUNTER — Telehealth: Payer: Self-pay | Admitting: Oncology

## 2020-03-16 VITALS — BP 178/87 | HR 72 | Temp 97.2°F | Resp 16 | Ht 62.5 in | Wt 156.0 lb

## 2020-03-16 DIAGNOSIS — R110 Nausea: Secondary | ICD-10-CM | POA: Insufficient documentation

## 2020-03-16 DIAGNOSIS — Z23 Encounter for immunization: Secondary | ICD-10-CM | POA: Insufficient documentation

## 2020-03-16 DIAGNOSIS — C19 Malignant neoplasm of rectosigmoid junction: Secondary | ICD-10-CM | POA: Diagnosis not present

## 2020-03-16 DIAGNOSIS — Z5189 Encounter for other specified aftercare: Secondary | ICD-10-CM | POA: Insufficient documentation

## 2020-03-16 DIAGNOSIS — C787 Secondary malignant neoplasm of liver and intrahepatic bile duct: Secondary | ICD-10-CM | POA: Insufficient documentation

## 2020-03-16 DIAGNOSIS — G629 Polyneuropathy, unspecified: Secondary | ICD-10-CM | POA: Insufficient documentation

## 2020-03-16 DIAGNOSIS — D509 Iron deficiency anemia, unspecified: Secondary | ICD-10-CM | POA: Insufficient documentation

## 2020-03-16 DIAGNOSIS — Z5111 Encounter for antineoplastic chemotherapy: Secondary | ICD-10-CM | POA: Insufficient documentation

## 2020-03-16 DIAGNOSIS — R197 Diarrhea, unspecified: Secondary | ICD-10-CM | POA: Insufficient documentation

## 2020-03-16 DIAGNOSIS — G893 Neoplasm related pain (acute) (chronic): Secondary | ICD-10-CM | POA: Insufficient documentation

## 2020-03-16 DIAGNOSIS — Z95828 Presence of other vascular implants and grafts: Secondary | ICD-10-CM

## 2020-03-16 DIAGNOSIS — C180 Malignant neoplasm of cecum: Secondary | ICD-10-CM | POA: Insufficient documentation

## 2020-03-16 LAB — CBC WITH DIFFERENTIAL (CANCER CENTER ONLY)
Abs Immature Granulocytes: 0.03 10*3/uL (ref 0.00–0.07)
Basophils Absolute: 0 10*3/uL (ref 0.0–0.1)
Basophils Relative: 1 %
Eosinophils Absolute: 0.1 10*3/uL (ref 0.0–0.5)
Eosinophils Relative: 2 %
HCT: 36 % (ref 36.0–46.0)
Hemoglobin: 11.5 g/dL — ABNORMAL LOW (ref 12.0–15.0)
Immature Granulocytes: 1 %
Lymphocytes Relative: 28 %
Lymphs Abs: 1.6 10*3/uL (ref 0.7–4.0)
MCH: 27.7 pg (ref 26.0–34.0)
MCHC: 31.9 g/dL (ref 30.0–36.0)
MCV: 86.7 fL (ref 80.0–100.0)
Monocytes Absolute: 0.5 10*3/uL (ref 0.1–1.0)
Monocytes Relative: 8 %
Neutro Abs: 3.5 10*3/uL (ref 1.7–7.7)
Neutrophils Relative %: 60 %
Platelet Count: 186 10*3/uL (ref 150–400)
RBC: 4.15 MIL/uL (ref 3.87–5.11)
RDW: 17 % — ABNORMAL HIGH (ref 11.5–15.5)
WBC Count: 5.8 10*3/uL (ref 4.0–10.5)
nRBC: 0 % (ref 0.0–0.2)

## 2020-03-16 LAB — CMP (CANCER CENTER ONLY)
ALT: 49 U/L — ABNORMAL HIGH (ref 0–44)
AST: 52 U/L — ABNORMAL HIGH (ref 15–41)
Albumin: 3.4 g/dL — ABNORMAL LOW (ref 3.5–5.0)
Alkaline Phosphatase: 544 U/L — ABNORMAL HIGH (ref 38–126)
Anion gap: 9 (ref 5–15)
BUN: 8 mg/dL (ref 8–23)
CO2: 27 mmol/L (ref 22–32)
Calcium: 10.3 mg/dL (ref 8.9–10.3)
Chloride: 105 mmol/L (ref 98–111)
Creatinine: 0.62 mg/dL (ref 0.44–1.00)
GFR, Est AFR Am: >60 mL/min (ref >60)
GFR, Estimated: >60 mL/min (ref >60)
Glucose, Bld: 87 mg/dL (ref 70–99)
Potassium: 3.7 mmol/L (ref 3.5–5.1)
Sodium: 141 mmol/L (ref 135–145)
Total Bilirubin: 0.4 mg/dL (ref 0.3–1.2)
Total Protein: 7.9 g/dL (ref 6.5–8.1)

## 2020-03-16 LAB — CEA (IN HOUSE-CHCC): CEA (CHCC-In House): 8512.99 ng/mL — ABNORMAL HIGH (ref 0.00–5.00)

## 2020-03-16 MED ORDER — DEXTROSE 5 % IV SOLN
Freq: Once | INTRAVENOUS | Status: AC
  Filled 2020-03-16: qty 250

## 2020-03-16 MED ORDER — HEPARIN SOD (PORK) LOCK FLUSH 100 UNIT/ML IV SOLN
500.0000 [IU] | Freq: Once | INTRAVENOUS | Status: DC | PRN
  Filled 2020-03-16: qty 5

## 2020-03-16 MED ORDER — DEXTROSE 5 % IV SOLN
Freq: Once | INTRAVENOUS | Status: DC
  Filled 2020-03-16: qty 250

## 2020-03-16 MED ORDER — SODIUM CHLORIDE 0.9 % IV SOLN
150.0000 mg | Freq: Once | INTRAVENOUS | Status: AC
  Administered 2020-03-16: 150 mg via INTRAVENOUS
  Filled 2020-03-16: qty 150

## 2020-03-16 MED ORDER — PALONOSETRON HCL INJECTION 0.25 MG/5ML
0.2500 mg | Freq: Once | INTRAVENOUS | Status: AC
  Administered 2020-03-16: 0.25 mg via INTRAVENOUS

## 2020-03-16 MED ORDER — OXALIPLATIN CHEMO INJECTION 100 MG/20ML
65.0000 mg/m2 | Freq: Once | INTRAVENOUS | Status: AC
  Administered 2020-03-16: 120 mg via INTRAVENOUS
  Filled 2020-03-16: qty 10

## 2020-03-16 MED ORDER — SODIUM CHLORIDE 0.9 % IV SOLN
1600.0000 mg/m2 | INTRAVENOUS | Status: DC
  Administered 2020-03-16: 2900 mg via INTRAVENOUS
  Filled 2020-03-16: qty 58

## 2020-03-16 MED ORDER — SODIUM CHLORIDE 0.9% FLUSH
10.0000 mL | INTRAVENOUS | Status: DC | PRN
  Administered 2020-03-16: 10 mL via INTRAVENOUS
  Filled 2020-03-16: qty 10

## 2020-03-16 MED ORDER — SODIUM CHLORIDE 0.9 % IV SOLN
10.0000 mg | Freq: Once | INTRAVENOUS | Status: AC
  Administered 2020-03-16: 10 mg via INTRAVENOUS
  Filled 2020-03-16: qty 10

## 2020-03-16 MED ORDER — SODIUM CHLORIDE 0.9% FLUSH
10.0000 mL | INTRAVENOUS | Status: DC | PRN
  Filled 2020-03-16: qty 10

## 2020-03-16 MED ORDER — LEUCOVORIN CALCIUM INJECTION 350 MG
300.0000 mg/m2 | Freq: Once | INTRAVENOUS | Status: AC
  Administered 2020-03-16: 544 mg via INTRAVENOUS
  Filled 2020-03-16: qty 27.2

## 2020-03-16 MED ORDER — PALONOSETRON HCL INJECTION 0.25 MG/5ML
INTRAVENOUS | Status: AC
  Filled 2020-03-16: qty 5

## 2020-03-16 NOTE — Progress Notes (Signed)
OFFICE PROGRESS NOTE   Diagnosis: Colon cancer  INTERVAL HISTORY:   Rachel Frank complete another cycle of FOLFOX on 03/02/2020.  She had nausea for a few days following chemotherapy.  No emesis.  No diarrhea.  She has developed mild "tingling "in the hands and feet.  No numbness.  No difficulty with activities.  She continues to feel much better compared to prechemotherapy. She received the COVID-19 booster on 03/02/2020. Objective:  Vital signs in last 24 hours:  Blood pressure (!) 178/87, pulse 72, temperature (!) 97.2 F (36.2 C), temperature source Tympanic, resp. rate 16, height 5' 2.5" (1.588 m), weight 156 lb (70.8 kg), SpO2 100 %.    HEENT: No thrush or ulcers Resp: Lungs clear bilaterally Cardio: Regular rate and rhythm GI: The liver is palpable throughout the upper abdomen with mild associated tenderness Vascular: No leg edema Neuro: Moderate loss of vibratory sense at the fingertips bilaterally Skin: Palms without erythema  Portacath/PICC-without erythema  Lab Results:  Lab Results  Component Value Date   WBC 5.8 03/16/2020   HGB 11.5 (L) 03/16/2020   HCT 36.0 03/16/2020   MCV 86.7 03/16/2020   PLT 186 03/16/2020   NEUTROABS 3.5 03/16/2020    CMP  Lab Results  Component Value Date   NA 141 03/16/2020   K 3.7 03/16/2020   CL 105 03/16/2020   CO2 27 03/16/2020   GLUCOSE 87 03/16/2020   BUN 8 03/16/2020   CREATININE 0.62 03/16/2020   CALCIUM 10.3 03/16/2020   PROT 7.9 03/16/2020   ALBUMIN 3.4 (L) 03/16/2020   AST 52 (H) 03/16/2020   ALT 49 (H) 03/16/2020   ALKPHOS 544 (H) 03/16/2020   BILITOT 0.4 03/16/2020   GFRNONAA >60 03/16/2020   GFRAA >60 03/16/2020    Lab Results  Component Value Date   CEA1 8,952.21 (H) 02/17/2020     Medications: I have reviewed the patient's current medications.   Assessment/Plan: 1.Stage IV adenocarcinoma of the cecum. She presented with a large cecal mass, extensive hepatic metastatic  disease; mild thoracic and abdominal adenopathy. She also has several small nonspecific pulmonary nodules. She was diagnosed in March 2021.   Colonoscopy 09/16/2019-fungating mass at the cecum, sessile polyps in the this sigmoid, transverse colon, and hepatic flexure, adenocarcinoma involving cecum biopsy, tubular adenoma and sessile serrated polyps  CTs chest, abdomen, and pelvis 10/08/2019-extensive liver metastases, thoracic/abdominal adenopathy, cecal mass, dilated and thin thickened appendix, nonspecific pulmonary nodules  Ultrasound-guided liver biopsy 10/13/2019-adenocarcinoma consistent with a metastatic colorectal primary, MSS, tumor mutation burden 3, KRAS Q61,  Cycle 1 FOLFOX 10/14/2019  Cycle 2 FOLFOX 10/27/2019  Cycle 3 FOLFOX 11/11/2019, Emend and Decadron added, 5-FU dose escalated  Cycle 4 FOLFOX 11/25/2019, Udenyca added  Cycle 5 FOLFOX 12/08/2019  Cycle 6 FOLFOX 12/23/2019  CT 01/04/2020-decreased size of multiple hepatic metastases, slight decrease in bulk of cecal mass, stable ileocecal adenopathy  Cycle 7 FOLFOX 01/06/2020  Cycle 8 FOLFOX 01/31/2020  Cycle 9 FOLFOX 02/17/2020 (oxaliplatin dose reduced due to borderline ANC, persistent cold sensitivity)  Cycle 10 FOLFOX 03/02/2020  Cycle 11 FOLFOX 03/16/2020 2) Iron deficiency anemia secondary to #1. 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  Diarrhea 01/20/2020.  C. difficile negative    Disposition: [name] appears stable.  She has tolerated chemotherapy well, but she has developed mild symptoms of oxaliplatin  neuropathy.  We discussed the risk of progressive neuropathy symptoms with continuing oxaliplatin.  We discussed holding oxaliplatin and completing a cycle of 5-FU/leucovorin.  [name] understands the risk  and would like to continue oxaliplatin for at least one more cycle.  She would like to defer a restaging CT evaluation until after cycle 12. The CEA was lower when she was here on 02/17/2020.  She will complete another cycle of FOLFOX today.  [name] will return for an office visit and chemotherapy in 2 weeks.  Betsy Coder, MD  03/16/2020  10:27 AM

## 2020-03-16 NOTE — Telephone Encounter (Signed)
Scheduled per 9/2 los. Pt is aware of appt times and date. Sent a message to charge pool nurse to move chemo time a little alter after follow up.

## 2020-03-16 NOTE — Patient Instructions (Signed)
[facility] Discharge Instructions for Patients Receiving Chemotherapy  Today you received the following chemotherapy agents Oxaliplatin (ELOXATIN), Leucovorin & Flourouracil (ADRUCIL).  To help prevent nausea and vomiting after your treatment, we encourage you to take your nausea medication as prescribed.   If you develop nausea and vomiting that is not controlled by your nausea medication, call the clinic.   BELOW ARE SYMPTOMS THAT SHOULD BE REPORTED IMMEDIATELY:  *FEVER GREATER THAN 100.5 F  *CHILLS WITH OR WITHOUT FEVER  NAUSEA AND VOMITING THAT IS NOT CONTROLLED WITH YOUR NAUSEA MEDICATION  *UNUSUAL SHORTNESS OF BREATH  *UNUSUAL BRUISING OR BLEEDING  TENDERNESS IN MOUTH AND THROAT WITH OR WITHOUT PRESENCE OF ULCERS  *URINARY PROBLEMS  *BOWEL PROBLEMS  UNUSUAL RASH Items with * indicate a potential emergency and should be followed up as soon as possible.  Feel free to call the clinic should you have any questions or concerns. The clinic phone number is [phone].  Please show the CHEMO ALERT CARD at check-in to the Emergency Department and triage nurse.   

## 2020-03-18 ENCOUNTER — Inpatient Hospital Stay: Payer: Self-pay

## 2020-03-18 ENCOUNTER — Other Ambulatory Visit: Payer: Self-pay

## 2020-03-18 VITALS — BP 146/81 | HR 60 | Temp 96.8°F | Resp 18

## 2020-03-18 MED ORDER — HEPARIN SOD (PORK) LOCK FLUSH 100 UNIT/ML IV SOLN
500.0000 [IU] | Freq: Once | INTRAVENOUS | Status: AC
  Administered 2020-03-18: 500 [IU] via INTRAVENOUS
  Filled 2020-03-18: qty 5

## 2020-03-18 MED ORDER — SODIUM CHLORIDE 0.9% FLUSH
10.0000 mL | INTRAVENOUS | Status: DC | PRN
  Administered 2020-03-18: 10 mL via INTRAVENOUS
  Filled 2020-03-18: qty 10

## 2020-03-18 MED ORDER — PEGFILGRASTIM-CBQV 6 MG/0.6ML ~~LOC~~ SOSY
6.0000 mg | PREFILLED_SYRINGE | Freq: Once | SUBCUTANEOUS | Status: AC
  Administered 2020-03-18: 6 mg via SUBCUTANEOUS

## 2020-03-26 ENCOUNTER — Other Ambulatory Visit: Payer: Self-pay | Admitting: Oncology

## 2020-03-29 ENCOUNTER — Telehealth: Payer: Self-pay | Admitting: *Deleted

## 2020-03-29 NOTE — Telephone Encounter (Signed)
Reports that patient had 35-45 minutes of feeling dizzy and be came nauseated. She is now lying down and resting. Her pulse was regular and not fast. Asking if they can give her OTC Dramamine or if she needs to go to ER? Instructed them to keep her hydrated and take her to ER only if she develops cardiac symptoms, passes out or becomes confused. Can try cool compress and OK to try Dramamine. Have her change positions slowly. Will see her tomorrow and assess.

## 2020-03-30 ENCOUNTER — Other Ambulatory Visit: Payer: Self-pay

## 2020-03-30 ENCOUNTER — Inpatient Hospital Stay (HOSPITAL_BASED_OUTPATIENT_CLINIC_OR_DEPARTMENT_OTHER): Payer: Self-pay | Admitting: Oncology

## 2020-03-30 ENCOUNTER — Inpatient Hospital Stay: Payer: Self-pay

## 2020-03-30 ENCOUNTER — Ambulatory Visit: Payer: 59

## 2020-03-30 VITALS — BP 153/78 | HR 68 | Temp 98.0°F | Resp 16 | Ht 62.5 in | Wt 153.1 lb

## 2020-03-30 DIAGNOSIS — C19 Malignant neoplasm of rectosigmoid junction: Secondary | ICD-10-CM

## 2020-03-30 DIAGNOSIS — Z23 Encounter for immunization: Secondary | ICD-10-CM

## 2020-03-30 LAB — CMP (CANCER CENTER ONLY)
ALT: 39 U/L (ref 0–44)
AST: 40 U/L (ref 15–41)
Albumin: 3.2 g/dL — ABNORMAL LOW (ref 3.5–5.0)
Alkaline Phosphatase: 587 U/L — ABNORMAL HIGH (ref 38–126)
Anion gap: 8 (ref 5–15)
BUN: 12 mg/dL (ref 8–23)
CO2: 28 mmol/L (ref 22–32)
Calcium: 9.8 mg/dL (ref 8.9–10.3)
Chloride: 100 mmol/L (ref 98–111)
Creatinine: 0.83 mg/dL (ref 0.44–1.00)
GFR, Est AFR Am: >60 mL/min (ref >60)
GFR, Estimated: >60 mL/min (ref >60)
Glucose, Bld: 147 mg/dL — ABNORMAL HIGH (ref 70–99)
Potassium: 3.3 mmol/L — ABNORMAL LOW (ref 3.5–5.1)
Sodium: 136 mmol/L (ref 135–145)
Total Bilirubin: 0.4 mg/dL (ref 0.3–1.2)
Total Protein: 8.4 g/dL — ABNORMAL HIGH (ref 6.5–8.1)

## 2020-03-30 LAB — CBC WITH DIFFERENTIAL (CANCER CENTER ONLY)
Abs Immature Granulocytes: 0.02 10*3/uL (ref 0.00–0.07)
Basophils Absolute: 0 10*3/uL (ref 0.0–0.1)
Basophils Relative: 1 %
Eosinophils Absolute: 0 10*3/uL (ref 0.0–0.5)
Eosinophils Relative: 1 %
HCT: 37.9 % (ref 36.0–46.0)
Hemoglobin: 12.3 g/dL (ref 12.0–15.0)
Immature Granulocytes: 1 %
Lymphocytes Relative: 30 %
Lymphs Abs: 1.3 10*3/uL (ref 0.7–4.0)
MCH: 27.8 pg (ref 26.0–34.0)
MCHC: 32.5 g/dL (ref 30.0–36.0)
MCV: 85.7 fL (ref 80.0–100.0)
Monocytes Absolute: 0.4 10*3/uL (ref 0.1–1.0)
Monocytes Relative: 10 %
Neutro Abs: 2.6 10*3/uL (ref 1.7–7.7)
Neutrophils Relative %: 57 %
Platelet Count: 249 10*3/uL (ref 150–400)
RBC: 4.42 MIL/uL (ref 3.87–5.11)
RDW: 15.9 % — ABNORMAL HIGH (ref 11.5–15.5)
WBC Count: 4.4 10*3/uL (ref 4.0–10.5)
nRBC: 0 % (ref 0.0–0.2)

## 2020-03-30 LAB — CEA (IN HOUSE-CHCC): CEA (CHCC-In House): 7304.52 ng/mL — ABNORMAL HIGH (ref 0.00–5.00)

## 2020-03-30 MED ORDER — INFLUENZA VAC SPLIT QUAD 0.5 ML IM SUSY
PREFILLED_SYRINGE | INTRAMUSCULAR | Status: AC
  Filled 2020-03-30: qty 0.5

## 2020-03-30 MED ORDER — SODIUM CHLORIDE 0.9 % IV SOLN
10.0000 mg | Freq: Once | INTRAVENOUS | Status: DC
  Filled 2020-03-30: qty 1

## 2020-03-30 MED ORDER — SODIUM CHLORIDE 0.9 % IV SOLN
150.0000 mg | Freq: Once | INTRAVENOUS | Status: DC
  Filled 2020-03-30: qty 5

## 2020-03-30 MED ORDER — INFLUENZA VAC SPLIT QUAD 0.5 ML IM SUSY
0.5000 mL | PREFILLED_SYRINGE | Freq: Once | INTRAMUSCULAR | Status: AC
  Administered 2020-03-30: 0.5 mL via INTRAMUSCULAR

## 2020-03-30 MED ORDER — PALONOSETRON HCL INJECTION 0.25 MG/5ML
INTRAVENOUS | Status: AC
  Filled 2020-03-30: qty 5

## 2020-03-30 MED ORDER — DEXTROSE 5 % IV SOLN
Freq: Once | INTRAVENOUS | Status: AC
  Filled 2020-03-30: qty 250

## 2020-03-30 MED ORDER — PALONOSETRON HCL INJECTION 0.25 MG/5ML
0.2500 mg | Freq: Once | INTRAVENOUS | Status: DC
  Administered 2020-03-30: 0.25 mg via INTRAVENOUS

## 2020-03-30 MED ORDER — OXALIPLATIN CHEMO INJECTION 100 MG/20ML: 65.0000 mg/m2 | Freq: Once | INTRAVENOUS | Status: DC

## 2020-03-30 MED ORDER — SODIUM CHLORIDE 0.9 % IV SOLN
300.0000 mg/m2 | Freq: Once | INTRAVENOUS | Status: AC
  Administered 2020-03-30: 544 mg via INTRAVENOUS
  Filled 2020-03-30: qty 27.2

## 2020-03-30 MED ORDER — SODIUM CHLORIDE 0.9 % IV SOLN
1600.0000 mg/m2 | INTRAVENOUS | Status: DC
  Administered 2020-03-30: 2900 mg via INTRAVENOUS
  Filled 2020-03-30: qty 58

## 2020-03-30 NOTE — Progress Notes (Signed)
Per Dr. Sherrill: OK to tx for K+ 3.3 and alk phos 587. Will hold oxaliplatin today due to neuropathy. 

## 2020-03-30 NOTE — Progress Notes (Signed)
Received message from Dr. Benay Spice to remove premeds, Oxaliplatin and Udenyca (on 9/18) from this cycle only.  Changed orders as discussed.  Hardie Pulley, PharmD, BCPS, BCOP

## 2020-03-30 NOTE — Progress Notes (Signed)
Confirmed holding oxaliplatin today.     T.O. Dr Ronette Deter, RN/Brianda Beitler Ronnald Ramp, PharmD

## 2020-03-30 NOTE — Progress Notes (Signed)
[place] OFFICE PROGRESS NOTE   Diagnosis: Colon cancer  INTERVAL HISTORY:   Rachel Frank returns as scheduled.  She complete another cycle of FOLFOX on 03/16/2020.  She has mild numbness in the fingers and feet, more prominent in the toes.  She had nausea and vomiting for 3 hours yesterday with associated "dizziness ".  She had balance difficulty when she attempted to ambulate.  The symptoms resolved. No mouth sores or diarrhea.  No abdominal pain.  Objective:  Vital signs in last 24 hours:  Blood pressure (!) 153/78, pulse 68, temperature 98 F (36.7 C), temperature source Tympanic, resp. rate 16, height 5' 2.5" (1.588 m), weight 153 lb 1.6 oz (69.4 kg), SpO2 100 %.    HEENT: No thrush or ulcers Resp: Lungs clear bilaterally Cardio: Regular rate and rhythm GI: The liver is palpable in the right upper abdomen with associated tenderness Vascular: No leg edema Neuro: Finger-to-nose testing is normal.  The extraocular movements appear intact.  Mild to moderate loss of vibratory sense at the left greater than right fingertips  Portacath/PICC-without erythema  Lab Results:  Lab Results  Component Value Date   WBC 4.4 03/30/2020   HGB 12.3 03/30/2020   HCT 37.9 03/30/2020   MCV 85.7 03/30/2020   PLT 249 03/30/2020   NEUTROABS 2.6 03/30/2020    CMP  Lab Results  Component Value Date   NA 136 03/30/2020   K 3.3 (L) 03/30/2020   CL 100 03/30/2020   CO2 28 03/30/2020   GLUCOSE 147 (H) 03/30/2020   BUN 12 03/30/2020   CREATININE 0.83 03/30/2020   CALCIUM 9.8 03/30/2020   PROT 8.4 (H) 03/30/2020   ALBUMIN 3.2 (L) 03/30/2020   AST 40 03/30/2020   ALT 39 03/30/2020   ALKPHOS 587 (H) 03/30/2020   BILITOT 0.4 03/30/2020   GFRNONAA >60 03/30/2020   GFRAA >60 03/30/2020    Lab Results  Component Value Date   CEA1 8,512.99 (H) 03/16/2020     Medications: I have reviewed the patient's current medications.   Assessment/Plan: 1.Stage IV adenocarcinoma  of the cecum. She presented with a large cecal mass, extensive hepatic metastatic disease; mild thoracic and abdominal adenopathy. She also has several small nonspecific pulmonary nodules. She was diagnosed in March 2021.   Colonoscopy 09/16/2019-fungating mass at the cecum, sessile polyps in the this sigmoid, transverse colon, and hepatic flexure, adenocarcinoma involving cecum biopsy, tubular adenoma and sessile serrated polyps  CTs chest, abdomen, and pelvis 10/08/2019-extensive liver metastases, thoracic/abdominal adenopathy, cecal mass, dilated and thin thickened appendix, nonspecific pulmonary nodules  Ultrasound-guided liver biopsy 10/13/2019-adenocarcinoma consistent with a metastatic colorectal primary, MSS, tumor mutation burden 3, KRAS Q61,  Cycle 1 FOLFOX 10/14/2019  Cycle 2 FOLFOX 10/27/2019  Cycle 3 FOLFOX 11/11/2019, Emend and Decadron added, 5-FU dose escalated  Cycle 4 FOLFOX 11/25/2019, Udenyca added  Cycle 5 FOLFOX 12/08/2019  Cycle 6 FOLFOX 12/23/2019  CT 01/04/2020-decreased size of multiple hepatic metastases, slight decrease in bulk of cecal mass, stable ileocecal adenopathy  Cycle 7 FOLFOX 01/06/2020  Cycle 8 FOLFOX 01/31/2020  Cycle 9 FOLFOX 02/17/2020 (oxaliplatin dose reduced due to borderline ANC, persistent cold sensitivity)  Cycle 10 FOLFOX 03/02/2020  Cycle 11 FOLFOX 03/16/2020  Cycle 12 FOLFOX 03/30/2020 (oxaliplatin held secondary to neuropathy) 2) Iron deficiency anemia secondary to #1. 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  Diarrhea 01/20/2020.  C. difficile negative   Disposition: Rachel Frank appears unchanged.  The etiology of the nausea/vomiting yesterday is unclear.  I doubt this is related to delayed nausea from chemotherapy.  She may have had an  episode of vertigo.  I have a low clinical suspicion for a CVA or brain metastasis.  She appears well today.  She will call for recurrent nausea/vomiting or balance difficulty.  Rachel Frank will complete another cycle of chemotherapy today.  Oxaliplatin will be held secondary to neuropathy.  She will undergo a restaging CT evaluation after this cycle.  Rachel Frank will return for an office visit in 2 weeks.  Betsy Coder, MD  03/30/2020  12:46 PM

## 2020-03-30 NOTE — Patient Instructions (Signed)
[facility] Discharge Instructions for Patients Receiving Chemotherapy  Today you received the following chemotherapy agents:Leucovorin/5FU.  To help prevent nausea and vomiting after your treatment, we encourage you to take your nausea medication as directed.   If you develop nausea and vomiting that is not controlled by your nausea medication, call the clinic.   BELOW ARE SYMPTOMS THAT SHOULD BE REPORTED IMMEDIATELY:  *FEVER GREATER THAN 100.5 F  *CHILLS WITH OR WITHOUT FEVER  NAUSEA AND VOMITING THAT IS NOT CONTROLLED WITH YOUR NAUSEA MEDICATION  *UNUSUAL SHORTNESS OF BREATH  *UNUSUAL BRUISING OR BLEEDING  TENDERNESS IN MOUTH AND THROAT WITH OR WITHOUT PRESENCE OF ULCERS  *URINARY PROBLEMS  *BOWEL PROBLEMS  UNUSUAL RASH Items with * indicate a potential emergency and should be followed up as soon as possible.  Feel free to call the clinic should you have any questions or concerns. The clinic phone number is [phone].  Please show the CHEMO ALERT CARD at check-in to the Emergency Department and triage nurse.   

## 2020-03-31 ENCOUNTER — Telehealth: Payer: Self-pay | Admitting: Oncology

## 2020-03-31 NOTE — Telephone Encounter (Signed)
Scheduled appointments per 9/16 los. Spoke with both patient and patient's daughter and both are aware of upcoming appointments.

## 2020-04-01 ENCOUNTER — Inpatient Hospital Stay: Payer: Self-pay

## 2020-04-01 ENCOUNTER — Other Ambulatory Visit: Payer: Self-pay

## 2020-04-01 VITALS — BP 156/96 | HR 73 | Temp 97.9°F | Resp 17 | Ht 62.5 in

## 2020-04-01 MED ORDER — HEPARIN SOD (PORK) LOCK FLUSH 100 UNIT/ML IV SOLN
500.0000 [IU] | Freq: Once | INTRAVENOUS | Status: AC | PRN
  Administered 2020-04-01: 500 [IU]
  Filled 2020-04-01: qty 5

## 2020-04-01 MED ORDER — SODIUM CHLORIDE 0.9% FLUSH
10.0000 mL | INTRAVENOUS | Status: DC | PRN
  Administered 2020-04-01: 10 mL
  Filled 2020-04-01: qty 10

## 2020-04-01 NOTE — Patient Instructions (Signed)

## 2020-04-09 ENCOUNTER — Other Ambulatory Visit: Payer: Self-pay | Admitting: Oncology

## 2020-04-10 ENCOUNTER — Telehealth: Payer: Self-pay | Admitting: Oncology

## 2020-04-10 ENCOUNTER — Telehealth: Payer: Self-pay | Admitting: *Deleted

## 2020-04-10 NOTE — Telephone Encounter (Signed)
Called to move all her appointments to after October 1st. She had a lapse in her insurance and it will be back in force as of October 1st. Rescheduled scan and scheduling message sent to change OV and tx.

## 2020-04-10 NOTE — Telephone Encounter (Signed)
R/s appt per 9/27 sch msg - pt is aware of apt .

## 2020-04-11 ENCOUNTER — Ambulatory Visit (HOSPITAL_COMMUNITY): Payer: Self-pay

## 2020-04-11 NOTE — Progress Notes (Signed)
Patient has been approved for drug replacement program by [employer] for Udenyca. The enrollment period is from 04/11/20 to 04/11/21. [employer] ID: [id number]. First DOS covered is 02/02/20. *[employer] retro to 02/02/20 due to termination of insurance on 01/12/20.

## 2020-04-13 ENCOUNTER — Ambulatory Visit: Payer: Self-pay

## 2020-04-13 ENCOUNTER — Other Ambulatory Visit: Payer: Self-pay

## 2020-04-13 ENCOUNTER — Ambulatory Visit: Payer: Self-pay | Admitting: Oncology

## 2020-04-14 NOTE — Progress Notes (Signed)
Pt on new insur with start date Oct 1.  Elliot Gault to call pt on Mon to get insur info to process the claim prior to treatment on Oct 7.

## 2020-04-19 ENCOUNTER — Other Ambulatory Visit: Payer: Self-pay

## 2020-04-19 ENCOUNTER — Ambulatory Visit (HOSPITAL_COMMUNITY)
Admission: RE | Admit: 2020-04-19 | Discharge: 2020-04-19 | Disposition: A | Discharge status: Home / Self Care | Payer: 59 | Source: Ambulatory Visit | Attending: Oncology | Admitting: Oncology

## 2020-04-19 DIAGNOSIS — C19 Malignant neoplasm of rectosigmoid junction: Secondary | ICD-10-CM | POA: Diagnosis present

## 2020-04-19 MED ORDER — IOHEXOL 300 MG/ML  SOLN
100.0000 mL | Freq: Once | INTRAMUSCULAR | Status: AC | PRN
  Administered 2020-04-19: 100 mL via INTRAVENOUS

## 2020-04-19 MED ORDER — HEPARIN SOD (PORK) LOCK FLUSH 100 UNIT/ML IV SOLN
INTRAVENOUS | Status: AC
  Filled 2020-04-19: qty 5

## 2020-04-20 ENCOUNTER — Inpatient Hospital Stay: Payer: 59

## 2020-04-20 ENCOUNTER — Inpatient Hospital Stay: Payer: 59 | Attending: Physician Assistant

## 2020-04-20 ENCOUNTER — Other Ambulatory Visit: Payer: Self-pay

## 2020-04-20 ENCOUNTER — Inpatient Hospital Stay (HOSPITAL_BASED_OUTPATIENT_CLINIC_OR_DEPARTMENT_OTHER): Payer: 59 | Admitting: Nurse Practitioner

## 2020-04-20 ENCOUNTER — Encounter: Payer: Self-pay | Admitting: Nurse Practitioner

## 2020-04-20 VITALS — BP 142/80 | HR 82 | Temp 97.5°F | Resp 16 | Ht 62.5 in | Wt 151.5 lb

## 2020-04-20 DIAGNOSIS — G620 Drug-induced polyneuropathy: Secondary | ICD-10-CM | POA: Diagnosis not present

## 2020-04-20 DIAGNOSIS — C180 Malignant neoplasm of cecum: Secondary | ICD-10-CM

## 2020-04-20 DIAGNOSIS — R110 Nausea: Secondary | ICD-10-CM | POA: Insufficient documentation

## 2020-04-20 DIAGNOSIS — D509 Iron deficiency anemia, unspecified: Secondary | ICD-10-CM | POA: Diagnosis not present

## 2020-04-20 DIAGNOSIS — R197 Diarrhea, unspecified: Secondary | ICD-10-CM | POA: Diagnosis not present

## 2020-04-20 DIAGNOSIS — Z5189 Encounter for other specified aftercare: Secondary | ICD-10-CM | POA: Insufficient documentation

## 2020-04-20 DIAGNOSIS — C19 Malignant neoplasm of rectosigmoid junction: Secondary | ICD-10-CM

## 2020-04-20 DIAGNOSIS — Z5112 Encounter for antineoplastic immunotherapy: Secondary | ICD-10-CM | POA: Diagnosis not present

## 2020-04-20 DIAGNOSIS — C787 Secondary malignant neoplasm of liver and intrahepatic bile duct: Secondary | ICD-10-CM | POA: Diagnosis not present

## 2020-04-20 DIAGNOSIS — G893 Neoplasm related pain (acute) (chronic): Secondary | ICD-10-CM

## 2020-04-20 DIAGNOSIS — Z95828 Presence of other vascular implants and grafts: Secondary | ICD-10-CM

## 2020-04-20 LAB — CBC WITH DIFFERENTIAL (CANCER CENTER ONLY)
Abs Immature Granulocytes: 0.01 10*3/uL (ref 0.00–0.07)
Basophils Absolute: 0.1 10*3/uL (ref 0.0–0.1)
Basophils Relative: 1 %
Eosinophils Absolute: 0.1 10*3/uL (ref 0.0–0.5)
Eosinophils Relative: 1 %
HCT: 33.8 % — ABNORMAL LOW (ref 36.0–46.0)
Hemoglobin: 11.1 g/dL — ABNORMAL LOW (ref 12.0–15.0)
Immature Granulocytes: 0 %
Lymphocytes Relative: 31 %
Lymphs Abs: 1.5 10*3/uL (ref 0.7–4.0)
MCH: 26.9 pg (ref 26.0–34.0)
MCHC: 32.8 g/dL (ref 30.0–36.0)
MCV: 82 fL (ref 80.0–100.0)
Monocytes Absolute: 0.7 10*3/uL (ref 0.1–1.0)
Monocytes Relative: 14 %
Neutro Abs: 2.5 10*3/uL (ref 1.7–7.7)
Neutrophils Relative %: 53 %
Platelet Count: 388 10*3/uL (ref 150–400)
RBC: 4.12 MIL/uL (ref 3.87–5.11)
RDW: 14.7 % (ref 11.5–15.5)
WBC Count: 4.9 10*3/uL (ref 4.0–10.5)
nRBC: 0 % (ref 0.0–0.2)

## 2020-04-20 LAB — CMP (CANCER CENTER ONLY)
ALT: 44 U/L (ref 0–44)
AST: 51 U/L — ABNORMAL HIGH (ref 15–41)
Albumin: 2.5 g/dL — ABNORMAL LOW (ref 3.5–5.0)
Alkaline Phosphatase: 549 U/L — ABNORMAL HIGH (ref 38–126)
Anion gap: 6 (ref 5–15)
BUN: 5 mg/dL — ABNORMAL LOW (ref 8–23)
CO2: 28 mmol/L (ref 22–32)
Calcium: 9.6 mg/dL (ref 8.9–10.3)
Chloride: 101 mmol/L (ref 98–111)
Creatinine: 0.63 mg/dL (ref 0.44–1.00)
GFR, Estimated: >60 mL/min (ref >60)
Glucose, Bld: 83 mg/dL (ref 70–99)
Potassium: 3.8 mmol/L (ref 3.5–5.1)
Sodium: 135 mmol/L (ref 135–145)
Total Bilirubin: 0.7 mg/dL (ref 0.3–1.2)
Total Protein: 8.5 g/dL — ABNORMAL HIGH (ref 6.5–8.1)

## 2020-04-20 LAB — CEA (IN HOUSE-CHCC): CEA (CHCC-In House): 7394.98 ng/mL — ABNORMAL HIGH (ref 0.00–5.00)

## 2020-04-20 MED ORDER — SODIUM CHLORIDE 0.9% FLUSH
10.0000 mL | INTRAVENOUS | Status: AC | PRN
  Administered 2020-04-20: 10 mL
  Filled 2020-04-20: qty 10

## 2020-04-20 MED ORDER — HEPARIN SOD (PORK) LOCK FLUSH 100 UNIT/ML IV SOLN
500.0000 [IU] | INTRAVENOUS | Status: AC | PRN
  Administered 2020-04-20: 500 [IU]
  Filled 2020-04-20: qty 5

## 2020-04-20 NOTE — Progress Notes (Signed)
[place] OFFICE PROGRESS NOTE   Diagnosis: Colon cancer  INTERVAL HISTORY:   Ms. Milsap returns as scheduled. She completed cycle 12 FOLFOX 03/30/2020. Oxaliplatin was held due to neuropathy.  She reports persistent neuropathy symptoms mainly affecting the feet.  This week she has noticed upper abdominal discomfort.  Also having some nausea.  No diarrhea.  She denies bleeding.  Overall good appetite.  Objective:  Vital signs in last 24 hours:  Blood pressure (!) 142/80, pulse 82, temperature (!) 97.5 F (36.4 C), temperature source Tympanic, resp. rate 16, height 5' 2.5" (1.588 m), weight 151 lb 8 oz (68.7 kg), SpO2 100 %.    HEENT: No thrush or ulcers. Resp: Lungs clear bilaterally. Cardio: Regular rate and rhythm. GI: Liver palpable right upper abdomen, associated tenderness. Vascular: No leg edema. Neuro: Alert and oriented.  Port-A-Cath without erythema.  Lab Results:  Lab Results  Component Value Date   WBC 4.9 04/20/2020   HGB 11.1 (L) 04/20/2020   HCT 33.8 (L) 04/20/2020   MCV 82.0 04/20/2020   PLT 388 04/20/2020   NEUTROABS 2.5 04/20/2020    Imaging:  CT ABDOMEN PELVIS W CONTRAST  Result Date: 04/20/2020 CLINICAL DATA:  Colorectal cancer.  Restaging. EXAM: CT ABDOMEN AND PELVIS WITH CONTRAST TECHNIQUE: Multidetector CT imaging of the abdomen and pelvis was performed using the standard protocol following bolus administration of intravenous contrast. CONTRAST:  159m OMNIPAQUE IOHEXOL 300 MG/ML  SOLN COMPARISON:  01/04/2020 FINDINGS: Lower chest: Unremarkable. Hepatobiliary: Numerous hepatic metastases again noted. Index lesion in the dome of the left liver measured previously at 1.4 cm is 2.1 cm today (image 17/series 2). Index lesion posterior aspect of the lateral segment left liver measured previously at 5 cm is now 6.4 cm (28/2) and shows interval development of central necrosis. 3rd index lesion noted central right liver on the previous study at  2.5 cm is 3.0 cm today (28/2). 6.3 cm necrotic lesion seen in the posterior right liver (27/2) was 5.4 cm previously (remeasured). Gallbladder is decompressed. No intrahepatic or extrahepatic biliary dilation. Pancreas: No focal mass lesion. No dilatation of the main duct. No intraparenchymal cyst. No peripancreatic edema. Spleen: No splenomegaly. No focal mass lesion. Adrenals/Urinary Tract: No adrenal nodule or mass. 6 mm nonobstructing stone again noted upper pole right kidney. Tiny low-density lesion in the upper pole left kidney is unchanged. No evidence for hydroureter. The urinary bladder appears normal for the degree of distention. Stomach/Bowel: Stomach is unremarkable. No gastric wall thickening. No evidence of outlet obstruction. Duodenum is normally positioned as is the ligament of Treitz. No small bowel wall thickening. No small bowel dilatation. Wall thickening and soft tissue in the region of the ileocecal valve and cecal tip is again noted. Given the irregular shape, this is difficult to reproducibly measure but the abnormal soft tissue is approximately 2.2 x 3.5 cm today compared to 2.6 x 3.3 cm previously (remeasured). This involves the appendiceal origin and stable distention of the appendix up to 11 mm evident. Diverticular changes are noted in the left colon without evidence of diverticulitis. Vascular/Lymphatic: There is abdominal aortic atherosclerosis without aneurysm. There is no gastrohepatic or hepatoduodenal ligament lymphadenopathy. No retroperitoneal or mesenteric lymphadenopathy. Ileocolic mesentery node measured previously at 8 mm is 8 mm again today (60/2). 11 mm focus of irregular soft tissue in the ileocolic mesentery (661/9 was 14 mm previously (remeasured). Reproductive: Calcified fibroids are noted in the uterus. There is no adnexal mass. Other: Small volume free fluid seen  in the cul-de-sac. Musculoskeletal: No worrisome lytic or sclerotic osseous abnormality. IMPRESSION: 1.  Abnormal soft tissue lesion in the region of the cecal tip involving the ileocecal valve and appendiceal orifice is again noted. This lesion shows no substantial change, measuring 2.2 x 3.5 cm today compared to 2.6 x 3.3 cm previously. 2. Slight interval progression of numerous hepatic metastases. 3. Similar appearance of small lymph nodes in the ileocolic mesentery with adjacent irregular focus of soft tissue also stable. 4. Small volume free fluid in the cul-de-sac. 5. Nonobstructing right renal stone. 6. Aortic Atherosclerosis (ICD10-I70.0). Electronically Signed   By: Misty Stanley M.D.   On: 04/20/2020 06:42    Medications: I have reviewed the patient's current medications.  Assessment/Plan: 1.Stage IV adenocarcinoma of the cecum. She presented with a large cecal mass, extensive hepatic metastatic disease; mild thoracic and abdominal adenopathy. She also has several small nonspecific pulmonary nodules. She was diagnosed in March 2021.   Colonoscopy 09/16/2019-fungating mass at the cecum, sessile polyps in the this sigmoid, transverse colon, and hepatic flexure, adenocarcinoma involving cecum biopsy, tubular adenoma and sessile serrated polyps  CTs chest, abdomen, and pelvis 10/08/2019-extensive liver metastases, thoracic/abdominal adenopathy, cecal mass, dilated and thin thickened appendix, nonspecific pulmonary nodules  Ultrasound-guided liver biopsy 10/13/2019-adenocarcinoma consistent with a metastatic colorectal primary, MSS, tumor mutation burden 3, KRAS Q61,  Cycle 1 FOLFOX 10/14/2019  Cycle 2 FOLFOX 10/27/2019  Cycle 3 FOLFOX 11/11/2019, Emend and Decadron added, 5-FU dose escalated  Cycle 4 FOLFOX 11/25/2019, Udenyca added  Cycle 5 FOLFOX 12/08/2019  Cycle 6 FOLFOX 12/23/2019  CT 01/04/2020-decreased size of multiple hepatic metastases, slight decrease in bulk of cecal mass, stable ileocecal adenopathy  Cycle 7 FOLFOX 01/06/2020  Cycle 8 FOLFOX 01/31/2020  Cycle 9 FOLFOX 02/17/2020  (oxaliplatin dose reduced due to borderline ANC, persistent cold sensitivity)  Cycle 10 FOLFOX 03/02/2020  Cycle 11 FOLFOX 03/16/2020  Cycle 12 FOLFOX 03/30/2020 (oxaliplatin held secondary to neuropathy)  CT abdomen/pelvis 04/19/2020-no significant change in the abnormal soft tissue lesion in the region of the cecal tip involving the ileocecal valve and appendiceal orifice; slight interval progression of numerous hepatic metastases; similar appearance of small lymph nodes in the ileocolic mesentery with adjacent irregular focus of soft tissue also stable 2) Iron deficiency anemia secondary to #1. 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 Diarrhea 01/20/2020. C. difficile negative   Disposition: Ms. Scrogham has completed 12 cycles of FOLFOX.  Recent restaging CT scan show evidence of progression in the liver.  Dr. Benay Spice recommends discontinuation of FOLFOX and initiation of FOLFIRI/Avastin.  We reviewed potential toxicities associated with irinotecan including bone marrow toxicity, nausea, hair loss, diarrhea.  We reviewed potential toxicities associated with Avastin including hypertension, bleeding, delayed wound healing, proteinuria, increased risk of blood clots, bowel perforation, posterior reversible encephalopathy syndrome.  She agrees to proceed.  She will return for lab, follow-up, cycle 1 FOLFIRI/Avastin in 1 week.  Patient seen with Dr. Benay Spice.    Ned Card ANP/GNP-BC   04/20/2020  9:43 AM This was a shared visit with Ned Card.  We reviewed the restaging CT images with Ms. Cayabyab and her daughter.  There is CT evidence of disease progression in the liver.  I recommend changing to FOLFIRI/bevacizumab.  We reviewed potential toxicities associated with this regimen.  She  agrees to proceed. A new chemotherapy plan was entered.  Julieanne Manson, MD

## 2020-04-20 NOTE — Patient Instructions (Signed)

## 2020-04-20 NOTE — Progress Notes (Signed)
DISCONTINUE ON PATHWAY REGIMEN - Colorectal     A cycle is every 14 days:     Oxaliplatin      Leucovorin      Fluorouracil      Fluorouracil   **Always confirm dose/schedule in your pharmacy ordering system**  REASON: Disease Progression PRIOR TREATMENT: [id number]: mFOLFOX6 q14 Days TREATMENT RESPONSE: Partial Response (PR)  START ON PATHWAY REGIMEN - Colorectal     A cycle is every 14 days:     Bevacizumab-xxxx      Irinotecan      Leucovorin      Fluorouracil      Fluorouracil   **Always confirm dose/schedule in your pharmacy ordering system**  Patient Characteristics: Distant Metastases, Nonsurgical Candidate, KRAS/NRAS Mutation Positive/Unknown (BRAF V600 Wild-Type/Unknown), Standard Cytotoxic Therapy, Second Line Standard Cytotoxic Therapy, Bevacizumab Eligible Tumor Location: Colon Therapeutic Status: Distant Metastases Microsatellite/Mismatch Repair Status: MSS/pMMR BRAF Mutation Status: Wild-Type (no mutation) KRAS/NRAS Mutation Status: Mutation Positive Standard Cytotoxic Line of Therapy: Second Line Standard Cytotoxic Therapy Bevacizumab Eligibility: Eligible Intent of Therapy: Non-Curative / Palliative Intent, Discussed with Patient

## 2020-04-20 NOTE — Addendum Note (Signed)
Addended by: Kennedy Bucker on: 04/20/2020 11:48 AM   Modules accepted: Orders, SmartSet

## 2020-04-24 ENCOUNTER — Telehealth: Payer: Self-pay | Admitting: Oncology

## 2020-04-24 NOTE — Telephone Encounter (Signed)
Called and spoke with patients husband, [name]. Informed and confirmed appt on 10/12 in [facility].

## 2020-04-25 ENCOUNTER — Inpatient Hospital Stay: Payer: 59

## 2020-04-25 ENCOUNTER — Other Ambulatory Visit: Payer: Self-pay

## 2020-04-25 VITALS — BP 120/87 | HR 79 | Temp 98.1°F | Resp 17

## 2020-04-25 DIAGNOSIS — C19 Malignant neoplasm of rectosigmoid junction: Secondary | ICD-10-CM

## 2020-04-25 DIAGNOSIS — Z5112 Encounter for antineoplastic immunotherapy: Secondary | ICD-10-CM | POA: Diagnosis not present

## 2020-04-25 MED ORDER — PALONOSETRON HCL INJECTION 0.25 MG/5ML
0.2500 mg | Freq: Once | INTRAVENOUS | Status: AC
  Administered 2020-04-25: 0.25 mg via INTRAVENOUS

## 2020-04-25 MED ORDER — PALONOSETRON HCL INJECTION 0.25 MG/5ML
INTRAVENOUS | Status: AC
  Filled 2020-04-25: qty 5

## 2020-04-25 MED ORDER — SODIUM CHLORIDE 0.9 % IV SOLN
Freq: Once | INTRAVENOUS | Status: AC
  Filled 2020-04-25: qty 250

## 2020-04-25 MED ORDER — ATROPINE SULFATE 1 MG/ML IJ SOLN: 0.5000 mg | Freq: Once | INTRAMUSCULAR | Status: DC | PRN

## 2020-04-25 MED ORDER — ATROPINE SULFATE 1 MG/ML IJ SOLN
INTRAMUSCULAR | Status: AC
  Filled 2020-04-25: qty 1

## 2020-04-25 MED ORDER — SODIUM CHLORIDE 0.9 % IV SOLN
5.0000 mg/kg | Freq: Once | INTRAVENOUS | Status: AC
  Administered 2020-04-25: 350 mg via INTRAVENOUS
  Filled 2020-04-25: qty 14

## 2020-04-25 MED ORDER — HEPARIN SOD (PORK) LOCK FLUSH 100 UNIT/ML IV SOLN
500.0000 [IU] | Freq: Once | INTRAVENOUS | Status: DC | PRN
  Filled 2020-04-25: qty 5

## 2020-04-25 MED ORDER — SODIUM CHLORIDE 0.9 % IV SOLN
1600.0000 mg/m2 | INTRAVENOUS | Status: DC
  Administered 2020-04-25: 2800 mg via INTRAVENOUS
  Filled 2020-04-25: qty 56

## 2020-04-25 MED ORDER — SODIUM CHLORIDE 0.9% FLUSH
10.0000 mL | INTRAVENOUS | Status: DC | PRN
  Filled 2020-04-25: qty 10

## 2020-04-25 MED ORDER — SODIUM CHLORIDE 0.9 % IV SOLN
180.0000 mg/m2 | Freq: Once | INTRAVENOUS | Status: AC
  Administered 2020-04-25: 320 mg via INTRAVENOUS
  Filled 2020-04-25: qty 15

## 2020-04-25 MED ORDER — SODIUM CHLORIDE 0.9 % IV SOLN
10.0000 mg | Freq: Once | INTRAVENOUS | Status: AC
  Administered 2020-04-25: 10 mg via INTRAVENOUS
  Filled 2020-04-25: qty 10

## 2020-04-25 MED ORDER — SODIUM CHLORIDE 0.9 % IV SOLN
300.0000 mg/m2 | Freq: Once | INTRAVENOUS | Status: AC
  Administered 2020-04-25: 522 mg via INTRAVENOUS
  Filled 2020-04-25: qty 26.1

## 2020-04-25 NOTE — Patient Instructions (Signed)
[facility] Discharge Instructions for Patients Receiving Chemotherapy  Today you received the following chemotherapy agents: Leucovorin, 5FU, Becacizumab, Irinotecan  To help prevent nausea and vomiting after your treatment, we encourage you to take your nausea medication as directed.   If you develop nausea and vomiting that is not controlled by your nausea medication, call the clinic.   BELOW ARE SYMPTOMS THAT SHOULD BE REPORTED IMMEDIATELY:  *FEVER GREATER THAN 100.5 F  *CHILLS WITH OR WITHOUT FEVER  NAUSEA AND VOMITING THAT IS NOT CONTROLLED WITH YOUR NAUSEA MEDICATION  *UNUSUAL SHORTNESS OF BREATH  *UNUSUAL BRUISING OR BLEEDING  TENDERNESS IN MOUTH AND THROAT WITH OR WITHOUT PRESENCE OF ULCERS  *URINARY PROBLEMS  *BOWEL PROBLEMS  UNUSUAL RASH Items with * indicate a potential emergency and should be followed up as soon as possible.  Feel free to call the clinic should you have any questions or concerns. The clinic phone number is [phone].  Please show the CHEMO ALERT CARD at check-in to the Emergency Department and triage nurse.   

## 2020-04-25 NOTE — Progress Notes (Signed)
Okay to proceed for treatment today with PA pending per Gaspar Bidding.

## 2020-04-27 ENCOUNTER — Inpatient Hospital Stay: Payer: 59

## 2020-04-27 ENCOUNTER — Other Ambulatory Visit: Payer: Self-pay | Admitting: Oncology

## 2020-04-27 ENCOUNTER — Other Ambulatory Visit: Payer: Self-pay

## 2020-04-27 VITALS — BP 174/89 | HR 63 | Temp 97.9°F | Resp 18

## 2020-04-27 DIAGNOSIS — Z5112 Encounter for antineoplastic immunotherapy: Secondary | ICD-10-CM | POA: Diagnosis not present

## 2020-04-27 DIAGNOSIS — C19 Malignant neoplasm of rectosigmoid junction: Secondary | ICD-10-CM

## 2020-04-27 MED ORDER — PEGFILGRASTIM-CBQV 6 MG/0.6ML ~~LOC~~ SOSY
6.0000 mg | PREFILLED_SYRINGE | Freq: Once | SUBCUTANEOUS | Status: AC
  Administered 2020-04-27: 6 mg via SUBCUTANEOUS

## 2020-04-27 MED ORDER — SODIUM CHLORIDE 0.9% FLUSH
10.0000 mL | INTRAVENOUS | Status: DC | PRN
  Administered 2020-04-27: 10 mL
  Filled 2020-04-27: qty 10

## 2020-04-27 MED ORDER — PEGFILGRASTIM-CBQV 6 MG/0.6ML ~~LOC~~ SOSY
PREFILLED_SYRINGE | SUBCUTANEOUS | Status: AC
  Filled 2020-04-27: qty 0.6

## 2020-04-27 MED ORDER — HEPARIN SOD (PORK) LOCK FLUSH 100 UNIT/ML IV SOLN
500.0000 [IU] | Freq: Once | INTRAVENOUS | Status: AC | PRN
  Administered 2020-04-27: 500 [IU]
  Filled 2020-04-27: qty 5

## 2020-05-07 ENCOUNTER — Other Ambulatory Visit: Payer: Self-pay | Admitting: Oncology

## 2020-05-11 ENCOUNTER — Inpatient Hospital Stay: Payer: 59

## 2020-05-11 ENCOUNTER — Inpatient Hospital Stay (HOSPITAL_BASED_OUTPATIENT_CLINIC_OR_DEPARTMENT_OTHER): Payer: 59 | Admitting: Nurse Practitioner

## 2020-05-11 ENCOUNTER — Other Ambulatory Visit: Payer: Self-pay

## 2020-05-11 ENCOUNTER — Encounter: Payer: Self-pay | Admitting: Nurse Practitioner

## 2020-05-11 ENCOUNTER — Other Ambulatory Visit: Payer: Self-pay | Admitting: Nurse Practitioner

## 2020-05-11 ENCOUNTER — Telehealth: Payer: Self-pay

## 2020-05-11 VITALS — BP 161/77

## 2020-05-11 VITALS — BP 142/86 | HR 79 | Temp 97.0°F | Resp 15 | Ht 62.5 in | Wt 148.2 lb

## 2020-05-11 DIAGNOSIS — C19 Malignant neoplasm of rectosigmoid junction: Secondary | ICD-10-CM

## 2020-05-11 DIAGNOSIS — Z95828 Presence of other vascular implants and grafts: Secondary | ICD-10-CM

## 2020-05-11 DIAGNOSIS — Z5112 Encounter for antineoplastic immunotherapy: Secondary | ICD-10-CM | POA: Diagnosis not present

## 2020-05-11 LAB — CBC WITH DIFFERENTIAL (CANCER CENTER ONLY)
Abs Immature Granulocytes: 0.02 10*3/uL (ref 0.00–0.07)
Basophils Absolute: 0 10*3/uL (ref 0.0–0.1)
Basophils Relative: 1 %
Eosinophils Absolute: 0.1 10*3/uL (ref 0.0–0.5)
Eosinophils Relative: 1 %
HCT: 37.9 % (ref 36.0–46.0)
Hemoglobin: 12.1 g/dL (ref 12.0–15.0)
Immature Granulocytes: 0 %
Lymphocytes Relative: 30 %
Lymphs Abs: 1.5 10*3/uL (ref 0.7–4.0)
MCH: 26.6 pg (ref 26.0–34.0)
MCHC: 31.9 g/dL (ref 30.0–36.0)
MCV: 83.3 fL (ref 80.0–100.0)
Monocytes Absolute: 0.3 10*3/uL (ref 0.1–1.0)
Monocytes Relative: 6 %
Neutro Abs: 3.1 10*3/uL (ref 1.7–7.7)
Neutrophils Relative %: 62 %
Platelet Count: 370 10*3/uL (ref 150–400)
RBC: 4.55 MIL/uL (ref 3.87–5.11)
RDW: 15.1 % (ref 11.5–15.5)
WBC Count: 5 10*3/uL (ref 4.0–10.5)
nRBC: 0 % (ref 0.0–0.2)

## 2020-05-11 LAB — CMP (CANCER CENTER ONLY)
ALT: 38 U/L (ref 0–44)
AST: 50 U/L — ABNORMAL HIGH (ref 15–41)
Albumin: 2.8 g/dL — ABNORMAL LOW (ref 3.5–5.0)
Alkaline Phosphatase: 704 U/L — ABNORMAL HIGH (ref 38–126)
Anion gap: 9 (ref 5–15)
BUN: 10 mg/dL (ref 8–23)
CO2: 28 mmol/L (ref 22–32)
Calcium: 9.5 mg/dL (ref 8.9–10.3)
Chloride: 99 mmol/L (ref 98–111)
Creatinine: 0.73 mg/dL (ref 0.44–1.00)
GFR, Estimated: >60 mL/min (ref >60)
Glucose, Bld: 117 mg/dL — ABNORMAL HIGH (ref 70–99)
Potassium: 3.3 mmol/L — ABNORMAL LOW (ref 3.5–5.1)
Sodium: 136 mmol/L (ref 135–145)
Total Bilirubin: 0.4 mg/dL (ref 0.3–1.2)
Total Protein: 8.3 g/dL — ABNORMAL HIGH (ref 6.5–8.1)

## 2020-05-11 LAB — TOTAL PROTEIN, URINE DIPSTICK: Protein, ur: 100 mg/dL — AB

## 2020-05-11 MED ORDER — PALONOSETRON HCL INJECTION 0.25 MG/5ML
0.2500 mg | Freq: Once | INTRAVENOUS | Status: AC
  Administered 2020-05-11: 0.25 mg via INTRAVENOUS

## 2020-05-11 MED ORDER — SODIUM CHLORIDE 0.9 % IV SOLN
10.0000 mg | Freq: Once | INTRAVENOUS | Status: AC
  Administered 2020-05-11: 10 mg via INTRAVENOUS
  Filled 2020-05-11: qty 10

## 2020-05-11 MED ORDER — SODIUM CHLORIDE 0.9 % IV SOLN
300.0000 mg/m2 | Freq: Once | INTRAVENOUS | Status: AC
  Administered 2020-05-11: 522 mg via INTRAVENOUS
  Filled 2020-05-11: qty 26.1

## 2020-05-11 MED ORDER — PALONOSETRON HCL INJECTION 0.25 MG/5ML
INTRAVENOUS | Status: AC
  Filled 2020-05-11: qty 5

## 2020-05-11 MED ORDER — SODIUM CHLORIDE 0.9% FLUSH
10.0000 mL | INTRAVENOUS | Status: DC | PRN
  Administered 2020-05-11: 10 mL via INTRAVENOUS
  Filled 2020-05-11: qty 10

## 2020-05-11 MED ORDER — ATROPINE SULFATE 1 MG/ML IJ SOLN
INTRAMUSCULAR | Status: AC
  Filled 2020-05-11: qty 1

## 2020-05-11 MED ORDER — SODIUM CHLORIDE 0.9 % IV SOLN
180.0000 mg/m2 | Freq: Once | INTRAVENOUS | Status: AC
  Administered 2020-05-11: 320 mg via INTRAVENOUS
  Filled 2020-05-11: qty 15

## 2020-05-11 MED ORDER — SODIUM CHLORIDE 0.9 % IV SOLN
Freq: Once | INTRAVENOUS | Status: AC
  Filled 2020-05-11: qty 250

## 2020-05-11 MED ORDER — ATROPINE SULFATE 1 MG/ML IJ SOLN: 0.5000 mg | Freq: Once | INTRAMUSCULAR | Status: DC | PRN

## 2020-05-11 MED ORDER — SODIUM CHLORIDE 0.9 % IV SOLN
1600.0000 mg/m2 | INTRAVENOUS | Status: DC
  Administered 2020-05-11: 2800 mg via INTRAVENOUS
  Filled 2020-05-11: qty 56

## 2020-05-11 MED ORDER — SODIUM CHLORIDE 0.9 % IV SOLN
5.0000 mg/kg | Freq: Once | INTRAVENOUS | Status: AC
  Administered 2020-05-11: 350 mg via INTRAVENOUS
  Filled 2020-05-11: qty 14

## 2020-05-11 NOTE — Patient Instructions (Signed)
[facility] Discharge Instructions for Patients Receiving Chemotherapy  Today you received the following chemotherapy agents: Leucovorin, 5FU, Becacizumab, Irinotecan  To help prevent nausea and vomiting after your treatment, we encourage you to take your nausea medication as directed.   If you develop nausea and vomiting that is not controlled by your nausea medication, call the clinic.   BELOW ARE SYMPTOMS THAT SHOULD BE REPORTED IMMEDIATELY:  *FEVER GREATER THAN 100.5 F  *CHILLS WITH OR WITHOUT FEVER  NAUSEA AND VOMITING THAT IS NOT CONTROLLED WITH YOUR NAUSEA MEDICATION  *UNUSUAL SHORTNESS OF BREATH  *UNUSUAL BRUISING OR BLEEDING  TENDERNESS IN MOUTH AND THROAT WITH OR WITHOUT PRESENCE OF ULCERS  *URINARY PROBLEMS  *BOWEL PROBLEMS  UNUSUAL RASH Items with * indicate a potential emergency and should be followed up as soon as possible.  Feel free to call the clinic should you have any questions or concerns. The clinic phone number is [phone].  Please show the [place] at check-in to the Emergency Department and triage nurse.

## 2020-05-11 NOTE — Progress Notes (Signed)
Pt unable to void, will release bevacizumab when pt voids & urine sample results   Ok to treat with urine protein 100 per Ned Card, NP

## 2020-05-11 NOTE — Progress Notes (Signed)
  [place] OFFICE PROGRESS NOTE   Diagnosis: Colon cancer  INTERVAL HISTORY:   Ms. Fei returns as scheduled.  She completed cycle 1 FOLFIRI/bevacizumab 04/25/2020.  No significant nausea.  No vomiting.  No mouth sores.  No diarrhea.  She occasionally notes blood with nose blowing.  No other bleeding.  Objective:  Vital signs in last 24 hours:  Blood pressure (!) 142/86, pulse 79, temperature (!) 97 F (36.1 C), temperature source Tympanic, resp. rate 15, height 5' 2.5" (1.588 m), weight 148 lb 3.2 oz (67.2 kg), SpO2 100 %.    HEENT: No thrush or ulcers. Resp: Lungs clear bilaterally. Cardio: Regular rate and rhythm. GI: Liver palpable right upper abdomen, crossing midline, associated tenderness. Vascular: No leg edema.  Calf soft and nontender. Neuro: Alert and oriented. Port-A-Cath without erythema.  Lab Results:  Lab Results  Component Value Date   WBC 5.0 05/11/2020   HGB 12.1 05/11/2020   HCT 37.9 05/11/2020   MCV 83.3 05/11/2020   PLT 370 05/11/2020   NEUTROABS 3.1 05/11/2020    Imaging:  No results found.  Medications: I have reviewed the patient's current medications.  Assessment/Plan: 1.Stage IV adenocarcinoma of the cecum. She presented with a large cecal mass, extensive hepatic metastatic disease; mild thoracic and abdominal adenopathy. She also has several small nonspecific pulmonary nodules. She was diagnosed in March 2021.   Colonoscopy 09/16/2019-fungating mass at the cecum, sessile polyps in the this sigmoid, transverse colon, and hepatic flexure, adenocarcinoma involving cecum biopsy, tubular adenoma and sessile serrated polyps  CTs chest, abdomen, and pelvis 10/08/2019-extensive liver metastases, thoracic/abdominal adenopathy, cecal mass, dilated and thin thickened appendix, nonspecific pulmonary nodules  Ultrasound-guided liver biopsy 10/13/2019-adenocarcinoma consistent with a metastatic colorectal primary, MSS, tumor mutation  burden 3, KRAS Q61,  Cycle 1 FOLFOX 10/14/2019  Cycle 2 FOLFOX 10/27/2019  Cycle 3 FOLFOX 11/11/2019, Emend and Decadron added, 5-FU dose escalated  Cycle 4 FOLFOX 11/25/2019, Udenyca added  Cycle 5 FOLFOX 12/08/2019  Cycle 6 FOLFOX 12/23/2019  CT 01/04/2020-decreased size of multiple hepatic metastases, slight decrease in bulk of cecal mass, stable ileocecal adenopathy  Cycle 7 FOLFOX 01/06/2020  Cycle 8 FOLFOX 01/31/2020  Cycle 9 FOLFOX 02/17/2020 (oxaliplatin dose reduced due to borderline ANC, persistent cold sensitivity)  Cycle 10 FOLFOX 03/02/2020  Cycle 11 FOLFOX 03/16/2020  Cycle 12 FOLFOX 03/30/2020 (oxaliplatin held secondary to neuropathy)  CT abdomen/pelvis 04/19/2020-no significant change in the abnormal soft tissue lesion in the region of the cecal tip involving the ileocecal valve and appendiceal orifice; slight interval progression of numerous hepatic metastases; similar appearance of small lymph nodes in the ileocolic mesentery with adjacent irregular focus of soft tissue also stable  Cycle 1 FOLFIRI/Avastin 04/25/2020  Cycle 2 FOLFIRI/Avastin 05/11/2020 2) Iron deficiency anemia secondary to #1. 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 Diarrhea 01/20/2020. C. difficile negative   Disposition: Ms. Reitz appears stable.  She tolerated the first cycle of FOLFIRI/Avastin well.  Plan to proceed with cycle 2 today as scheduled.  We reviewed the CBC from today.  Counts adequate to proceed with treatment.  She will return for lab, follow-up, cycle 3 FOLFIRI/Avastin in 3 weeks.  She will contact the office in the interim with any problems.    Ned Card ANP/GNP-BC   05/11/2020  12:21 PM

## 2020-05-11 NOTE — Telephone Encounter (Signed)
Please let her know she has mild hypokalemia. Is she taking potassium as listed on her medications?  Called spoke with pt she will make sure she is taking potassium as directed daily

## 2020-05-12 ENCOUNTER — Telehealth: Payer: Self-pay | Admitting: Oncology

## 2020-05-12 MED ORDER — LIDOCAINE HCL 2 % IJ SOLN
INTRAMUSCULAR | Status: AC
  Filled 2020-05-12: qty 20

## 2020-05-12 NOTE — Telephone Encounter (Signed)
Scheduled appointments per 10/28 los. Spoke to patient who is aware of appointments. I told her about her Lab/flush/OV on 11/10 is here at the [facility] and her infusion on 11/11 will be at [facility]. Okay per MD and scheduled in [place] via secure chat with Donita Brooks.

## 2020-05-13 ENCOUNTER — Inpatient Hospital Stay: Payer: 59

## 2020-05-13 ENCOUNTER — Other Ambulatory Visit: Payer: Self-pay

## 2020-05-13 VITALS — BP 138/80 | HR 65 | Temp 97.7°F | Resp 18

## 2020-05-13 DIAGNOSIS — C19 Malignant neoplasm of rectosigmoid junction: Secondary | ICD-10-CM

## 2020-05-13 DIAGNOSIS — Z5112 Encounter for antineoplastic immunotherapy: Secondary | ICD-10-CM | POA: Diagnosis not present

## 2020-05-13 MED ORDER — SODIUM CHLORIDE 0.9% FLUSH
10.0000 mL | INTRAVENOUS | Status: DC | PRN
  Administered 2020-05-13: 10 mL
  Filled 2020-05-13: qty 10

## 2020-05-13 MED ORDER — PEGFILGRASTIM-CBQV 6 MG/0.6ML ~~LOC~~ SOSY
6.0000 mg | PREFILLED_SYRINGE | Freq: Once | SUBCUTANEOUS | Status: AC
  Administered 2020-05-13: 6 mg via SUBCUTANEOUS

## 2020-05-13 MED ORDER — HEPARIN SOD (PORK) LOCK FLUSH 100 UNIT/ML IV SOLN
500.0000 [IU] | Freq: Once | INTRAVENOUS | Status: AC | PRN
  Administered 2020-05-13: 500 [IU]
  Filled 2020-05-13: qty 5

## 2020-05-15 NOTE — Progress Notes (Signed)
The following Assist/Replace Program for Udenyca from [employer] has been terminated due to Insurance coverage re-instated.  Last DOS:04/27/2020

## 2020-05-21 ENCOUNTER — Other Ambulatory Visit: Payer: Self-pay | Admitting: Oncology

## 2020-05-24 ENCOUNTER — Inpatient Hospital Stay (HOSPITAL_BASED_OUTPATIENT_CLINIC_OR_DEPARTMENT_OTHER): Payer: 59 | Admitting: Nurse Practitioner

## 2020-05-24 ENCOUNTER — Encounter: Payer: Self-pay | Admitting: Nurse Practitioner

## 2020-05-24 ENCOUNTER — Inpatient Hospital Stay: Payer: 59 | Attending: Physician Assistant

## 2020-05-24 ENCOUNTER — Other Ambulatory Visit: Payer: Self-pay

## 2020-05-24 ENCOUNTER — Inpatient Hospital Stay: Payer: 59

## 2020-05-24 ENCOUNTER — Ambulatory Visit: Payer: Self-pay | Admitting: Oncology

## 2020-05-24 VITALS — BP 146/90 | HR 70 | Temp 97.8°F | Resp 16 | Ht 62.5 in | Wt 149.1 lb

## 2020-05-24 DIAGNOSIS — R197 Diarrhea, unspecified: Secondary | ICD-10-CM | POA: Insufficient documentation

## 2020-05-24 DIAGNOSIS — G893 Neoplasm related pain (acute) (chronic): Secondary | ICD-10-CM | POA: Insufficient documentation

## 2020-05-24 DIAGNOSIS — C19 Malignant neoplasm of rectosigmoid junction: Secondary | ICD-10-CM | POA: Diagnosis not present

## 2020-05-24 DIAGNOSIS — Z5111 Encounter for antineoplastic chemotherapy: Secondary | ICD-10-CM | POA: Insufficient documentation

## 2020-05-24 DIAGNOSIS — D509 Iron deficiency anemia, unspecified: Secondary | ICD-10-CM | POA: Insufficient documentation

## 2020-05-24 DIAGNOSIS — C787 Secondary malignant neoplasm of liver and intrahepatic bile duct: Secondary | ICD-10-CM | POA: Insufficient documentation

## 2020-05-24 DIAGNOSIS — G620 Drug-induced polyneuropathy: Secondary | ICD-10-CM | POA: Diagnosis not present

## 2020-05-24 DIAGNOSIS — C180 Malignant neoplasm of cecum: Secondary | ICD-10-CM | POA: Diagnosis present

## 2020-05-24 DIAGNOSIS — Z5189 Encounter for other specified aftercare: Secondary | ICD-10-CM | POA: Insufficient documentation

## 2020-05-24 LAB — CBC WITH DIFFERENTIAL (CANCER CENTER ONLY)
Abs Immature Granulocytes: 0.22 10*3/uL — ABNORMAL HIGH (ref 0.00–0.07)
Basophils Absolute: 0 10*3/uL (ref 0.0–0.1)
Basophils Relative: 1 %
Eosinophils Absolute: 0 10*3/uL (ref 0.0–0.5)
Eosinophils Relative: 0 %
HCT: 36.4 % (ref 36.0–46.0)
Hemoglobin: 11.8 g/dL — ABNORMAL LOW (ref 12.0–15.0)
Immature Granulocytes: 3 %
Lymphocytes Relative: 31 %
Lymphs Abs: 2.1 10*3/uL (ref 0.7–4.0)
MCH: 27.2 pg (ref 26.0–34.0)
MCHC: 32.4 g/dL (ref 30.0–36.0)
MCV: 83.9 fL (ref 80.0–100.0)
Monocytes Absolute: 0.6 10*3/uL (ref 0.1–1.0)
Monocytes Relative: 8 %
Neutro Abs: 3.8 10*3/uL (ref 1.7–7.7)
Neutrophils Relative %: 57 %
Platelet Count: 279 10*3/uL (ref 150–400)
RBC: 4.34 MIL/uL (ref 3.87–5.11)
RDW: 16 % — ABNORMAL HIGH (ref 11.5–15.5)
WBC Count: 6.7 10*3/uL (ref 4.0–10.5)
nRBC: 0 % (ref 0.0–0.2)

## 2020-05-24 LAB — CMP (CANCER CENTER ONLY)
ALT: 40 U/L (ref 0–44)
AST: 37 U/L (ref 15–41)
Albumin: 3 g/dL — ABNORMAL LOW (ref 3.5–5.0)
Alkaline Phosphatase: 599 U/L — ABNORMAL HIGH (ref 38–126)
Anion gap: 9 (ref 5–15)
BUN: 8 mg/dL (ref 8–23)
CO2: 28 mmol/L (ref 22–32)
Calcium: 9.2 mg/dL (ref 8.9–10.3)
Chloride: 103 mmol/L (ref 98–111)
Creatinine: 0.62 mg/dL (ref 0.44–1.00)
GFR, Estimated: >60 mL/min (ref >60)
Glucose, Bld: 86 mg/dL (ref 70–99)
Potassium: 3.4 mmol/L — ABNORMAL LOW (ref 3.5–5.1)
Sodium: 140 mmol/L (ref 135–145)
Total Bilirubin: 0.5 mg/dL (ref 0.3–1.2)
Total Protein: 7.6 g/dL (ref 6.5–8.1)

## 2020-05-24 LAB — CEA (IN HOUSE-CHCC): CEA (CHCC-In House): 9941.18 ng/mL — ABNORMAL HIGH (ref 0.00–5.00)

## 2020-05-24 LAB — TOTAL PROTEIN, URINE DIPSTICK: Protein, ur: 300 mg/dL — AB

## 2020-05-24 NOTE — Progress Notes (Addendum)
[place] OFFICE PROGRESS NOTE   Diagnosis: Colon cancer  INTERVAL HISTORY:   Rachel Frank returns as scheduled.  She completed cycle 2 FOLFIRI/bevacizumab 05/11/2020.  She denies nausea/vomiting.  No diarrhea.  She recently noticed a single sore area of the left lower inner gumline.  She used Biotene and reports improvement.  She has an occasional mild nosebleed.  No other bleeding.  Objective:  Vital signs in last 24 hours:  Blood pressure (!) 146/90, pulse 70, temperature 97.8 F (36.6 C), temperature source Tympanic, resp. rate 16, height 5' 2.5" (1.588 m), weight 149 lb 1.6 oz (67.6 kg), SpO2 99 %.    HEENT: No thrush or ulcers. Resp: Lungs clear bilaterally. Cardio: Regular rate and rhythm. GI: Firm fullness right upper abdomen, mild associated tenderness. Vascular: No leg edema.  Calves soft and nontender. Neuro: Alert and oriented. Skin: No rash. Port-A-Cath without erythema.   Lab Results:  Lab Results  Component Value Date   WBC 6.7 05/24/2020   HGB 11.8 (L) 05/24/2020   HCT 36.4 05/24/2020   MCV 83.9 05/24/2020   PLT 279 05/24/2020   NEUTROABS 3.8 05/24/2020    Imaging:  No results found.  Medications: I have reviewed the patient's current medications.  Assessment/Plan: 1.Stage IV adenocarcinoma of the cecum. She presented with a large cecal mass, extensive hepatic metastatic disease; mild thoracic and abdominal adenopathy. She also has several small nonspecific pulmonary nodules. She was diagnosed in March 2021.   Colonoscopy 09/16/2019-fungating mass at the cecum, sessile polyps in the this sigmoid, transverse colon, and hepatic flexure, adenocarcinoma involving cecum biopsy, tubular adenoma and sessile serrated polyps  CTs chest, abdomen, and pelvis 10/08/2019-extensive liver metastases, thoracic/abdominal adenopathy, cecal mass, dilated and thin thickened appendix, nonspecific pulmonary nodules  Ultrasound-guided liver biopsy  10/13/2019-adenocarcinoma consistent with a metastatic colorectal primary, MSS, tumor mutation burden 3, KRAS Q61,  Cycle 1 FOLFOX 10/14/2019  Cycle 2 FOLFOX 10/27/2019  Cycle 3 FOLFOX 11/11/2019, Emend and Decadron added, 5-FU dose escalated  Cycle 4 FOLFOX 11/25/2019, Udenyca added  Cycle 5 FOLFOX 12/08/2019  Cycle 6 FOLFOX 12/23/2019  CT 01/04/2020-decreased size of multiple hepatic metastases, slight decrease in bulk of cecal mass, stable ileocecal adenopathy  Cycle 7 FOLFOX 01/06/2020  Cycle 8 FOLFOX 01/31/2020  Cycle 9 FOLFOX 02/17/2020 (oxaliplatin dose reduced due to borderline ANC, persistent cold sensitivity)  Cycle 10 FOLFOX 03/02/2020  Cycle 11 FOLFOX 03/16/2020  Cycle 12 FOLFOX 03/30/2020 (oxaliplatin held secondary to neuropathy)  CT abdomen/pelvis 04/19/2020-no significant change in the abnormal soft tissue lesion in the region of the cecal tip involving the ileocecal valve and appendiceal orifice; slight interval progression of numerous hepatic metastases; similar appearance of small lymph nodes in the ileocolic mesentery with adjacent irregular focus of soft tissue also stable  Cycle 1 FOLFIRI/Avastin 04/25/2020  Cycle 2 FOLFIRI/Avastin 05/11/2020  Cycle 3 FOLFIRI/Avastin 05/25/2020 2) Iron deficiency anemia secondary to #1. 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 Diarrhea 01/20/2020. C. difficile negative  Disposition: Rachel Frank appears stable.  She has completed 2 cycles of FOLFIRI/Avastin.  She is tolerating treatment well.  Plan to proceed with cycle 3 as scheduled 05/25/2020.  We reviewed the CBC and chemistry panel from today.  Labs adequate to proceed with treatment.  She has persistent mild hypokalemia.  She will increase potassium to 10 mEq 3 times  daily.  She will return for lab, follow-up, cycle 4 FOLFIRI/Avastin on 06/07/2020.  She will contact the office in the interim with any problems.  Patient seen with Dr. Benay Spice.    Ned Card ANP/GNP-BC   05/24/2020  11:07 AM This was a shared visit with Ned Card. Rachel Frank will complete cycle three FOLFIRI/Avastin tomorrow. The plan is to complete five cycles of FOLFIRI/Avastin prior to restaging CTs.  Julieanne Manson, MD

## 2020-05-25 ENCOUNTER — Other Ambulatory Visit: Payer: Self-pay

## 2020-05-25 ENCOUNTER — Inpatient Hospital Stay: Payer: 59

## 2020-05-25 ENCOUNTER — Other Ambulatory Visit: Payer: Self-pay | Admitting: *Deleted

## 2020-05-25 ENCOUNTER — Other Ambulatory Visit: Payer: Self-pay | Admitting: Nurse Practitioner

## 2020-05-25 ENCOUNTER — Ambulatory Visit: Payer: Self-pay | Admitting: Oncology

## 2020-05-25 ENCOUNTER — Telehealth: Payer: Self-pay | Admitting: Nurse Practitioner

## 2020-05-25 VITALS — BP 154/86 | HR 68 | Temp 98.2°F | Resp 16

## 2020-05-25 DIAGNOSIS — Z5111 Encounter for antineoplastic chemotherapy: Secondary | ICD-10-CM | POA: Diagnosis not present

## 2020-05-25 DIAGNOSIS — N069 Isolated proteinuria with unspecified morphologic lesion: Secondary | ICD-10-CM

## 2020-05-25 DIAGNOSIS — C19 Malignant neoplasm of rectosigmoid junction: Secondary | ICD-10-CM

## 2020-05-25 LAB — URINALYSIS, COMPLETE (UACMP) WITH MICROSCOPIC
Bilirubin Urine: NEGATIVE
Glucose, UA: NEGATIVE mg/dL
Hgb urine dipstick: NEGATIVE
Ketones, ur: NEGATIVE mg/dL
Leukocytes,Ua: NEGATIVE
Nitrite: NEGATIVE
Protein, ur: 100 mg/dL — AB
Specific Gravity, Urine: 1.02 (ref 1.005–1.030)
pH: 8.5 — ABNORMAL HIGH (ref 5.0–8.0)

## 2020-05-25 MED ORDER — SODIUM CHLORIDE 0.9 % IV SOLN
10.0000 mg | Freq: Once | INTRAVENOUS | Status: AC
  Administered 2020-05-25: 10 mg via INTRAVENOUS
  Filled 2020-05-25: qty 10

## 2020-05-25 MED ORDER — ATROPINE SULFATE 1 MG/ML IJ SOLN
INTRAMUSCULAR | Status: AC
  Filled 2020-05-25: qty 1

## 2020-05-25 MED ORDER — SODIUM CHLORIDE 0.9% FLUSH
10.0000 mL | INTRAVENOUS | Status: DC | PRN
  Filled 2020-05-25: qty 10

## 2020-05-25 MED ORDER — SODIUM CHLORIDE 0.9 % IV SOLN
180.0000 mg/m2 | Freq: Once | INTRAVENOUS | Status: AC
  Administered 2020-05-25: 320 mg via INTRAVENOUS
  Filled 2020-05-25: qty 10

## 2020-05-25 MED ORDER — PALONOSETRON HCL INJECTION 0.25 MG/5ML
INTRAVENOUS | Status: AC
  Filled 2020-05-25: qty 5

## 2020-05-25 MED ORDER — SODIUM CHLORIDE 0.9 % IV SOLN
Freq: Once | INTRAVENOUS | Status: AC
  Filled 2020-05-25: qty 250

## 2020-05-25 MED ORDER — ATROPINE SULFATE 1 MG/ML IJ SOLN
0.5000 mg | Freq: Once | INTRAMUSCULAR | Status: AC | PRN
  Administered 2020-05-25: 0.5 mg via INTRAVENOUS

## 2020-05-25 MED ORDER — HEPARIN SOD (PORK) LOCK FLUSH 100 UNIT/ML IV SOLN
500.0000 [IU] | Freq: Once | INTRAVENOUS | Status: DC | PRN
  Filled 2020-05-25: qty 5

## 2020-05-25 MED ORDER — SODIUM CHLORIDE 0.9 % IV SOLN
1600.0000 mg/m2 | INTRAVENOUS | Status: DC
  Administered 2020-05-25: 2800 mg via INTRAVENOUS
  Filled 2020-05-25: qty 56

## 2020-05-25 MED ORDER — SODIUM CHLORIDE 0.9 % IV SOLN
300.0000 mg/m2 | Freq: Once | INTRAVENOUS | Status: AC
  Administered 2020-05-25: 522 mg via INTRAVENOUS
  Filled 2020-05-25: qty 26.1

## 2020-05-25 MED ORDER — PALONOSETRON HCL INJECTION 0.25 MG/5ML
0.2500 mg | Freq: Once | INTRAVENOUS | Status: AC
  Administered 2020-05-25: 0.25 mg via INTRAVENOUS

## 2020-05-25 NOTE — Patient Instructions (Signed)
[facility] Discharge Instructions for Patients Receiving Chemotherapy  Today you received the following chemotherapy agents 5FU, Camptosar, Leucovorin  To help prevent nausea and vomiting after your treatment, we encourage you to take your nausea medication    If you develop nausea and vomiting that is not controlled by your nausea medication, call the clinic.   BELOW ARE SYMPTOMS THAT SHOULD BE REPORTED IMMEDIATELY:  *FEVER GREATER THAN 100.5 F  *CHILLS WITH OR WITHOUT FEVER  NAUSEA AND VOMITING THAT IS NOT CONTROLLED WITH YOUR NAUSEA MEDICATION  *UNUSUAL SHORTNESS OF BREATH  *UNUSUAL BRUISING OR BLEEDING  TENDERNESS IN MOUTH AND THROAT WITH OR WITHOUT PRESENCE OF ULCERS  *URINARY PROBLEMS  *BOWEL PROBLEMS  UNUSUAL RASH Items with * indicate a potential emergency and should be followed up as soon as possible.  Feel free to call the clinic should you have any questions or concerns. The clinic phone number is [phone].  Please show the [place] at check-in to the Emergency Department and triage nurse.

## 2020-05-25 NOTE — Telephone Encounter (Signed)
Scheduled appointments per 11/10 los. Will have updated calendar printed for patient at next visit.

## 2020-05-25 NOTE — Progress Notes (Signed)
Per office note by Ned Card NP on 05/24/20 ok to proceed with treatment today.   Per secure chat, no Avastin due to elevated Urine protein  UA will be done today per orders

## 2020-05-27 ENCOUNTER — Inpatient Hospital Stay: Payer: 59

## 2020-05-27 ENCOUNTER — Other Ambulatory Visit: Payer: Self-pay

## 2020-05-27 VITALS — BP 173/97 | HR 66 | Temp 97.0°F | Resp 18

## 2020-05-27 DIAGNOSIS — Z5111 Encounter for antineoplastic chemotherapy: Secondary | ICD-10-CM | POA: Diagnosis not present

## 2020-05-27 DIAGNOSIS — C19 Malignant neoplasm of rectosigmoid junction: Secondary | ICD-10-CM

## 2020-05-27 MED ORDER — HEPARIN SOD (PORK) LOCK FLUSH 100 UNIT/ML IV SOLN
500.0000 [IU] | Freq: Once | INTRAVENOUS | Status: AC | PRN
  Administered 2020-05-27: 500 [IU]
  Filled 2020-05-27: qty 5

## 2020-05-27 MED ORDER — SODIUM CHLORIDE 0.9% FLUSH
10.0000 mL | INTRAVENOUS | Status: DC | PRN
  Administered 2020-05-27: 10 mL
  Filled 2020-05-27: qty 10

## 2020-05-27 MED ORDER — PEGFILGRASTIM-CBQV 6 MG/0.6ML ~~LOC~~ SOSY
6.0000 mg | PREFILLED_SYRINGE | Freq: Once | SUBCUTANEOUS | Status: AC
  Administered 2020-05-27: 6 mg via SUBCUTANEOUS

## 2020-05-27 NOTE — Patient Instructions (Signed)
Pegfilgrastim injection What is this medicine? PEGFILGRASTIM (PEG fil gra stim) is a long-acting granulocyte colony-stimulating factor that stimulates the growth of neutrophils, a type of white blood cell important in the body's fight against infection. It is used to reduce the incidence of fever and infection in patients with certain types of cancer who are receiving chemotherapy that affects the bone marrow, and to increase survival after being exposed to high doses of radiation. This medicine may be used for other purposes; ask your health care provider or pharmacist if you have questions. COMMON BRAND NAME(S): Fulphila, Neulasta, UDENYCA, Ziextenzo What should I tell my health care provider before I take this medicine? They need to know if you have any of these conditions:  kidney disease  latex allergy  ongoing radiation therapy  sickle cell disease  skin reactions to acrylic adhesives (On-Body Injector only)  an unusual or allergic reaction to pegfilgrastim, filgrastim, other medicines, foods, dyes, or preservatives  pregnant or trying to get pregnant  breast-feeding How should I use this medicine? This medicine is for injection under the skin. If you get this medicine at home, you will be taught how to prepare and give the pre-filled syringe or how to use the On-body Injector. Refer to the patient Instructions for Use for detailed instructions. Use exactly as directed. Tell your healthcare provider immediately if you suspect that the On-body Injector may not have performed as intended or if you suspect the use of the On-body Injector resulted in a missed or partial dose. It is important that you put your used needles and syringes in a special sharps container. Do not put them in a trash can. If you do not have a sharps container, call your pharmacist or healthcare provider to get one. Talk to your pediatrician regarding the use of this medicine in children. While this drug may be  prescribed for selected conditions, precautions do apply. Overdosage: If you think you have taken too much of this medicine contact a poison control center or emergency room at once. NOTE: This medicine is only for you. Do not share this medicine with others. What if I miss a dose? It is important not to miss your dose. Call your doctor or health care professional if you miss your dose. If you miss a dose due to an On-body Injector failure or leakage, a new dose should be administered as soon as possible using a single prefilled syringe for manual use. What may interact with this medicine? Interactions have not been studied. Give your health care provider a list of all the medicines, herbs, non-prescription drugs, or dietary supplements you use. Also tell them if you smoke, drink alcohol, or use illegal drugs. Some items may interact with your medicine. This list may not describe all possible interactions. Give your health care provider a list of all the medicines, herbs, non-prescription drugs, or dietary supplements you use. Also tell them if you smoke, drink alcohol, or use illegal drugs. Some items may interact with your medicine. What should I watch for while using this medicine? You may need blood work done while you are taking this medicine. If you are going to need a MRI, CT scan, or other procedure, tell your doctor that you are using this medicine (On-Body Injector only). What side effects may I notice from receiving this medicine? Side effects that you should report to your doctor or health care professional as soon as possible:  allergic reactions like skin rash, itching or hives, swelling of the   face, lips, or tongue  back pain  dizziness  fever  pain, redness, or irritation at site where injected  pinpoint red spots on the skin  red or dark-brown urine  shortness of breath or breathing problems  stomach or side pain, or pain at the  shoulder  swelling  tiredness  trouble passing urine or change in the amount of urine Side effects that usually do not require medical attention (report to your doctor or health care professional if they continue or are bothersome):  bone pain  muscle pain This list may not describe all possible side effects. Call your doctor for medical advice about side effects. You may report side effects to FDA at 1-800-FDA-1088. Where should I keep my medicine? Keep out of the reach of children. If you are using this medicine at home, you will be instructed on how to store it. Throw away any unused medicine after the expiration date on the label. NOTE: This sheet is a summary. It may not cover all possible information. If you have questions about this medicine, talk to your doctor, pharmacist, or health care provider.  2020 Elsevier/Gold Standard (2017-10-06 16:57:08) Implanted Port Home Guide An implanted port is a device that is placed under the skin. It is usually placed in the chest. The device can be used to give IV medicine, to take blood, or for dialysis. You may have an implanted port if:  You need IV medicine that would be irritating to the small veins in your hands or arms.  You need IV medicines, such as antibiotics, for a long period of time.  You need IV nutrition for a long period of time.  You need dialysis. Having a port means that your health care provider will not need to use the veins in your arms for these procedures. You may have fewer limitations when using a port than you would if you used other types of long-term IVs, and you will likely be able to return to normal activities after your incision heals. An implanted port has two main parts:  Reservoir. The reservoir is the part where a needle is inserted to give medicines or draw blood. The reservoir is round. After it is placed, it appears as a small, raised area under your skin.  Catheter. The catheter is a thin,  flexible tube that connects the reservoir to a vein. Medicine that is inserted into the reservoir goes into the catheter and then into the vein. How is my port accessed? To access your port:  A numbing cream may be placed on the skin over the port site.  Your health care provider will put on a mask and sterile gloves.  The skin over your port will be cleaned carefully with a germ-killing soap and allowed to dry.  Your health care provider will gently pinch the port and insert a needle into it.  Your health care provider will check for a blood return to make sure the port is in the vein and is not clogged.  If your port needs to remain accessed to get medicine continuously (constant infusion), your health care provider will place a clear bandage (dressing) over the needle site. The dressing and needle will need to be changed every week, or as told by your health care provider. What is flushing? Flushing helps keep the port from getting clogged. Follow instructions from your health care provider about how and when to flush the port. Ports are usually flushed with saline solution or a medicine   called heparin. The need for flushing will depend on how the port is used:  If the port is only used from time to time to give medicines or draw blood, the port may need to be flushed: ? Before and after medicines have been given. ? Before and after blood has been drawn. ? As part of routine maintenance. Flushing may be recommended every 4-6 weeks.  If a constant infusion is running, the port may not need to be flushed.  Throw away any syringes in a disposal container that is meant for sharp items (sharps container). You can buy a sharps container from a pharmacy, or you can make one by using an empty hard plastic bottle with a cover. How long will my port stay implanted? The port can stay in for as long as your health care provider thinks it is needed. When it is time for the port to come out, a  surgery will be done to remove it. The surgery will be similar to the procedure that was done to put the port in. Follow these instructions at home:   Flush your port as told by your health care provider.  If you need an infusion over several days, follow instructions from your health care provider about how to take care of your port site. Make sure you: ? Wash your hands with soap and water before you change your dressing. If soap and water are not available, use alcohol-based hand sanitizer. ? Change your dressing as told by your health care provider. ? Place any used dressings or infusion bags into a plastic bag. Throw that bag in the trash. ? Keep the dressing that covers the needle clean and dry. Do not get it wet. ? Do not use scissors or sharp objects near the tube. ? Keep the tube clamped, unless it is being used.  Check your port site every day for signs of infection. Check for: ? Redness, swelling, or pain. ? Fluid or blood. ? Pus or a bad smell.  Protect the skin around the port site. ? Avoid wearing bra straps that rub or irritate the site. ? Protect the skin around your port from seat belts. Place a soft pad over your chest if needed.  Bathe or shower as told by your health care provider. The site may get wet as long as you are not actively receiving an infusion.  Return to your normal activities as told by your health care provider. Ask your health care provider what activities are safe for you.  Carry a medical alert card or wear a medical alert bracelet at all times. This will let health care providers know that you have an implanted port in case of an emergency. Get help right away if:  You have redness, swelling, or pain at the port site.  You have fluid or blood coming from your port site.  You have pus or a bad smell coming from the port site.  You have a fever. Summary  Implanted ports are usually placed in the chest for long-term IV access.  Follow  instructions from your health care provider about flushing the port and changing bandages (dressings).  Take care of the area around your port by avoiding clothing that puts pressure on the area, and by watching for signs of infection.  Protect the skin around your port from seat belts. Place a soft pad over your chest if needed.  Get help right away if you have a fever or you have redness,   swelling, pain, drainage, or a bad smell at the port site. This information is not intended to replace advice given to you by your health care provider. Make sure you discuss any questions you have with your health care provider. Document Revised: 10/23/2018 Document Reviewed: 08/03/2016 Elsevier Patient Education  2020 Elsevier Inc.  

## 2020-06-04 ENCOUNTER — Other Ambulatory Visit: Payer: Self-pay | Admitting: Oncology

## 2020-06-07 ENCOUNTER — Other Ambulatory Visit: Payer: Self-pay

## 2020-06-07 ENCOUNTER — Inpatient Hospital Stay: Payer: 59

## 2020-06-07 ENCOUNTER — Inpatient Hospital Stay (HOSPITAL_BASED_OUTPATIENT_CLINIC_OR_DEPARTMENT_OTHER): Payer: 59 | Admitting: Nurse Practitioner

## 2020-06-07 ENCOUNTER — Other Ambulatory Visit: Payer: Self-pay | Admitting: Nurse Practitioner

## 2020-06-07 ENCOUNTER — Encounter: Payer: Self-pay | Admitting: Nurse Practitioner

## 2020-06-07 VITALS — BP 147/78 | HR 87 | Temp 97.6°F | Resp 16 | Ht 62.5 in | Wt 150.9 lb

## 2020-06-07 DIAGNOSIS — C19 Malignant neoplasm of rectosigmoid junction: Secondary | ICD-10-CM

## 2020-06-07 DIAGNOSIS — Z5111 Encounter for antineoplastic chemotherapy: Secondary | ICD-10-CM | POA: Diagnosis not present

## 2020-06-07 LAB — TOTAL PROTEIN, URINE DIPSTICK: Protein, ur: 100 mg/dL — AB

## 2020-06-07 LAB — CMP (CANCER CENTER ONLY)
ALT: 39 U/L (ref 0–44)
AST: 38 U/L (ref 15–41)
Albumin: 3.1 g/dL — ABNORMAL LOW (ref 3.5–5.0)
Alkaline Phosphatase: 512 U/L — ABNORMAL HIGH (ref 38–126)
Anion gap: 9 (ref 5–15)
BUN: 9 mg/dL (ref 8–23)
CO2: 26 mmol/L (ref 22–32)
Calcium: 9.2 mg/dL (ref 8.9–10.3)
Chloride: 105 mmol/L (ref 98–111)
Creatinine: 0.77 mg/dL (ref 0.44–1.00)
GFR, Estimated: >60 mL/min (ref >60)
Glucose, Bld: 132 mg/dL — ABNORMAL HIGH (ref 70–99)
Potassium: 3.3 mmol/L — ABNORMAL LOW (ref 3.5–5.1)
Sodium: 140 mmol/L (ref 135–145)
Total Bilirubin: 0.4 mg/dL (ref 0.3–1.2)
Total Protein: 7.2 g/dL (ref 6.5–8.1)

## 2020-06-07 LAB — CBC WITH DIFFERENTIAL (CANCER CENTER ONLY)
Abs Immature Granulocytes: 0.05 10*3/uL (ref 0.00–0.07)
Basophils Absolute: 0 10*3/uL (ref 0.0–0.1)
Basophils Relative: 0 %
Eosinophils Absolute: 0 10*3/uL (ref 0.0–0.5)
Eosinophils Relative: 0 %
HCT: 36.2 % (ref 36.0–46.0)
Hemoglobin: 11.5 g/dL — ABNORMAL LOW (ref 12.0–15.0)
Immature Granulocytes: 1 %
Lymphocytes Relative: 28 %
Lymphs Abs: 1.5 10*3/uL (ref 0.7–4.0)
MCH: 27.1 pg (ref 26.0–34.0)
MCHC: 31.8 g/dL (ref 30.0–36.0)
MCV: 85.2 fL (ref 80.0–100.0)
Monocytes Absolute: 0.3 10*3/uL (ref 0.1–1.0)
Monocytes Relative: 6 %
Neutro Abs: 3.3 10*3/uL (ref 1.7–7.7)
Neutrophils Relative %: 65 %
Platelet Count: 302 10*3/uL (ref 150–400)
RBC: 4.25 MIL/uL (ref 3.87–5.11)
RDW: 16.9 % — ABNORMAL HIGH (ref 11.5–15.5)
WBC Count: 5.1 10*3/uL (ref 4.0–10.5)
nRBC: 0 % (ref 0.0–0.2)

## 2020-06-07 LAB — CEA (IN HOUSE-CHCC): CEA (CHCC-In House): 7925.42 ng/mL — ABNORMAL HIGH (ref 0.00–5.00)

## 2020-06-07 MED ORDER — SODIUM CHLORIDE 0.9 % IV SOLN
5.0000 mg/kg | Freq: Once | INTRAVENOUS | Status: AC
  Administered 2020-06-07: 350 mg via INTRAVENOUS
  Filled 2020-06-07: qty 14

## 2020-06-07 MED ORDER — PALONOSETRON HCL INJECTION 0.25 MG/5ML
0.2500 mg | Freq: Once | INTRAVENOUS | Status: AC
  Administered 2020-06-07: 0.25 mg via INTRAVENOUS

## 2020-06-07 MED ORDER — SODIUM CHLORIDE 0.9 % IV SOLN
300.0000 mg/m2 | Freq: Once | INTRAVENOUS | Status: AC
  Administered 2020-06-07: 522 mg via INTRAVENOUS
  Filled 2020-06-07: qty 26.1

## 2020-06-07 MED ORDER — PALONOSETRON HCL INJECTION 0.25 MG/5ML
INTRAVENOUS | Status: AC
  Filled 2020-06-07: qty 5

## 2020-06-07 MED ORDER — POTASSIUM CHLORIDE ER 10 MEQ PO CPCR: 20.0000 meq | ORAL_CAPSULE | Freq: Two times a day (BID) | ORAL | 2 refills | Status: DC

## 2020-06-07 MED ORDER — SODIUM CHLORIDE 0.9 % IV SOLN
1600.0000 mg/m2 | INTRAVENOUS | Status: DC
  Administered 2020-06-07: 2800 mg via INTRAVENOUS
  Filled 2020-06-07: qty 56

## 2020-06-07 MED ORDER — ATROPINE SULFATE 1 MG/ML IJ SOLN
INTRAMUSCULAR | Status: AC
  Filled 2020-06-07: qty 1

## 2020-06-07 MED ORDER — ATROPINE SULFATE 1 MG/ML IJ SOLN
0.5000 mg | Freq: Once | INTRAMUSCULAR | Status: AC | PRN
  Administered 2020-06-07: 0.5 mg via INTRAVENOUS

## 2020-06-07 MED ORDER — SODIUM CHLORIDE 0.9 % IV SOLN
180.0000 mg/m2 | Freq: Once | INTRAVENOUS | Status: AC
  Administered 2020-06-07: 320 mg via INTRAVENOUS
  Filled 2020-06-07: qty 15

## 2020-06-07 MED ORDER — SODIUM CHLORIDE 0.9 % IV SOLN
10.0000 mg | Freq: Once | INTRAVENOUS | Status: AC
  Administered 2020-06-07: 10 mg via INTRAVENOUS
  Filled 2020-06-07: qty 10

## 2020-06-07 MED ORDER — SODIUM CHLORIDE 0.9 % IV SOLN
Freq: Once | INTRAVENOUS | Status: AC
  Filled 2020-06-07: qty 250

## 2020-06-07 NOTE — Progress Notes (Signed)
Ok to treat with UP 100 per Ned Card, NP

## 2020-06-07 NOTE — Patient Instructions (Addendum)
[facility] Discharge Instructions for Patients Receiving Chemotherapy  Today you received the following chemotherapy agents: Bevacizumab-bvzr (ZIRABEV), Irinotecan (CAMPTOSAR), Leucovorin & Flourouracil (ADRUCIL).  To help prevent nausea and vomiting after your treatment, we encourage you to take your nausea medication as directed.   If you develop nausea and vomiting that is not controlled by your nausea medication, call the clinic.   BELOW ARE SYMPTOMS THAT SHOULD BE REPORTED IMMEDIATELY:  *FEVER GREATER THAN 100.5 F  *CHILLS WITH OR WITHOUT FEVER  NAUSEA AND VOMITING THAT IS NOT CONTROLLED WITH YOUR NAUSEA MEDICATION  *UNUSUAL SHORTNESS OF BREATH  *UNUSUAL BRUISING OR BLEEDING  TENDERNESS IN MOUTH AND THROAT WITH OR WITHOUT PRESENCE OF ULCERS  *URINARY PROBLEMS  *BOWEL PROBLEMS  UNUSUAL RASH Items with * indicate a potential emergency and should be followed up as soon as possible.  Feel free to call the clinic should you have any questions or concerns. The clinic phone number is [phone].  Please show the [place] at check-in to the Emergency Department and triage nurse.

## 2020-06-07 NOTE — Progress Notes (Signed)
[facility] OFFICE PROGRESS NOTE   Diagnosis: Colon cancer  INTERVAL HISTORY:   Rachel Frank returns as scheduled.  She completed cycle 3 FOLFIRI 05/25/2020.  Avastin was held due to proteinuria.  She denies nausea/vomiting.  No mouth sores.  She had loose stools for a few days.  She developed irritation from the frequent bowel movements and noted blood on the toilet tissue on a few occasions.  No other bleeding.  No shortness of breath.  No cough.  She describes her pain as "fine".  Objective:  Vital signs in last 24 hours:  Blood pressure (!) 147/78, pulse 87, temperature 97.6 F (36.4 C), temperature source Tympanic, resp. rate 16, height 5' 2.5" (1.588 m), weight 150 lb 14.4 oz (68.4 kg), SpO2 100 %.    HEENT: No thrush or ulcers. Resp: Lungs clear bilaterally. Cardio: Regular rate and rhythm. GI: Liver palpable right mid abdomen, no significant associated tenderness. Vascular: No leg edema.  Calves soft and nontender.  Skin: Palms without erythema. Port-A-Cath without erythema.   Lab Results:  Lab Results  Component Value Date   WBC 5.1 06/07/2020   HGB 11.5 (L) 06/07/2020   HCT 36.2 06/07/2020   MCV 85.2 06/07/2020   PLT 302 06/07/2020   NEUTROABS 3.3 06/07/2020    Imaging:  No results found.  Medications: I have reviewed the patient's current medications.  Assessment/Plan: 1.Stage IV adenocarcinoma of the cecum. She presented with a large cecal mass, extensive hepatic metastatic disease; mild thoracic and abdominal adenopathy. She also has several small nonspecific pulmonary nodules. She was diagnosed in March 2021.   Colonoscopy 09/16/2019-fungating mass at the cecum, sessile polyps in the this sigmoid, transverse colon, and hepatic flexure, adenocarcinoma involving cecum biopsy, tubular adenoma and sessile serrated polyps  CTs chest, abdomen, and pelvis 10/08/2019-extensive liver metastases, thoracic/abdominal adenopathy, cecal mass, dilated and  thin thickened appendix, nonspecific pulmonary nodules  Ultrasound-guided liver biopsy 10/13/2019-adenocarcinoma consistent with a metastatic colorectal primary, MSS, tumor mutation burden 3, KRAS Q61,  Cycle 1 FOLFOX 10/14/2019  Cycle 2 FOLFOX 10/27/2019  Cycle 3 FOLFOX 11/11/2019, Emend and Decadron added, 5-FU dose escalated  Cycle 4 FOLFOX 11/25/2019, Udenyca added  Cycle 5 FOLFOX 12/08/2019  Cycle 6 FOLFOX 12/23/2019  CT 01/04/2020-decreased size of multiple hepatic metastases, slight decrease in bulk of cecal mass, stable ileocecal adenopathy  Cycle 7 FOLFOX 01/06/2020  Cycle 8 FOLFOX 01/31/2020  Cycle 9 FOLFOX 02/17/2020 (oxaliplatin dose reduced due to borderline ANC, persistent cold sensitivity)  Cycle 10 FOLFOX 03/02/2020  Cycle 11 FOLFOX 03/16/2020  Cycle 12 FOLFOX 03/30/2020 (oxaliplatin held secondary to neuropathy)  CT abdomen/pelvis 04/19/2020-no significant change in the abnormal soft tissue lesion in the region of the cecal tip involving the ileocecal valve and appendiceal orifice; slight interval progression of numerous hepatic metastases; similar appearance of small lymph nodes in the ileocolic mesentery with adjacent irregular focus of soft tissue also stable  Cycle 1 FOLFIRI/Avastin 04/25/2020  Cycle 2 FOLFIRI/Avastin 05/11/2020  Cycle 3 FOLFIRI 05/25/2020 (Avastin held due to proteinuria)  Cycle 4 FOLFIRI/Avastin 06/07/2020 2) Iron deficiency anemia secondary to #1. 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 Diarrhea 01/20/2020. C. difficile negative  Disposition: Rachel Frank appears stable.  She has completed 3 cycles of FOLFIRI/2 cycles of Avastin.  Avastin was held 2 weeks ago due to proteinuria.  We will follow-up on the  urine protein from today.  If the urine  protein level remains elevated Avastin will be held again and she will complete a 24-hour urine.  If urine protein is at baseline plan to proceed with cycle 4 FOLFIRI/Avastin today as scheduled.  Restaging CTs after completing 5 cycles.  We reviewed the CBC from today.  Counts adequate to proceed with treatment.  She will return for lab, follow-up, cycle 5 FOLFIRI/Avastin in 2 weeks.  She will contact the office in the interim with any problems.    Ned Card ANP/GNP-BC   06/07/2020  12:48 PM

## 2020-06-07 NOTE — Progress Notes (Signed)
Pt tolerated tx well, ambulatory, steady gait, relative driving pt home

## 2020-06-09 ENCOUNTER — Inpatient Hospital Stay: Payer: 59

## 2020-06-09 ENCOUNTER — Other Ambulatory Visit: Payer: Self-pay

## 2020-06-09 VITALS — BP 138/62 | Resp 18

## 2020-06-09 DIAGNOSIS — C19 Malignant neoplasm of rectosigmoid junction: Secondary | ICD-10-CM

## 2020-06-09 DIAGNOSIS — Z5111 Encounter for antineoplastic chemotherapy: Secondary | ICD-10-CM | POA: Diagnosis not present

## 2020-06-09 MED ORDER — PEGFILGRASTIM-CBQV 6 MG/0.6ML ~~LOC~~ SOSY
6.0000 mg | PREFILLED_SYRINGE | Freq: Once | SUBCUTANEOUS | Status: AC
  Administered 2020-06-09: 6 mg via SUBCUTANEOUS

## 2020-06-09 MED ORDER — HEPARIN SOD (PORK) LOCK FLUSH 100 UNIT/ML IV SOLN
500.0000 [IU] | Freq: Once | INTRAVENOUS | Status: AC | PRN
  Administered 2020-06-09: 500 [IU]
  Filled 2020-06-09: qty 5

## 2020-06-09 MED ORDER — PEGFILGRASTIM-CBQV 6 MG/0.6ML ~~LOC~~ SOSY
PREFILLED_SYRINGE | SUBCUTANEOUS | Status: AC
  Filled 2020-06-09: qty 0.6

## 2020-06-09 MED ORDER — SODIUM CHLORIDE 0.9% FLUSH
10.0000 mL | INTRAVENOUS | Status: DC | PRN
  Administered 2020-06-09: 10 mL
  Filled 2020-06-09: qty 10

## 2020-06-09 NOTE — Patient Instructions (Signed)

## 2020-06-18 ENCOUNTER — Other Ambulatory Visit: Payer: Self-pay | Admitting: Oncology

## 2020-06-22 ENCOUNTER — Inpatient Hospital Stay: Payer: 59 | Attending: Physician Assistant

## 2020-06-22 ENCOUNTER — Other Ambulatory Visit: Payer: Self-pay

## 2020-06-22 ENCOUNTER — Inpatient Hospital Stay: Payer: 59

## 2020-06-22 ENCOUNTER — Inpatient Hospital Stay (HOSPITAL_BASED_OUTPATIENT_CLINIC_OR_DEPARTMENT_OTHER): Payer: 59 | Admitting: Oncology

## 2020-06-22 VITALS — BP 161/90 | HR 75 | Temp 98.2°F | Resp 17 | Ht 62.5 in | Wt 156.6 lb

## 2020-06-22 DIAGNOSIS — Z5112 Encounter for antineoplastic immunotherapy: Secondary | ICD-10-CM | POA: Diagnosis not present

## 2020-06-22 DIAGNOSIS — C19 Malignant neoplasm of rectosigmoid junction: Secondary | ICD-10-CM | POA: Diagnosis not present

## 2020-06-22 DIAGNOSIS — G893 Neoplasm related pain (acute) (chronic): Secondary | ICD-10-CM | POA: Insufficient documentation

## 2020-06-22 DIAGNOSIS — C180 Malignant neoplasm of cecum: Secondary | ICD-10-CM | POA: Insufficient documentation

## 2020-06-22 DIAGNOSIS — R110 Nausea: Secondary | ICD-10-CM | POA: Diagnosis not present

## 2020-06-22 DIAGNOSIS — Z5111 Encounter for antineoplastic chemotherapy: Secondary | ICD-10-CM | POA: Diagnosis present

## 2020-06-22 DIAGNOSIS — R197 Diarrhea, unspecified: Secondary | ICD-10-CM | POA: Diagnosis not present

## 2020-06-22 DIAGNOSIS — G620 Drug-induced polyneuropathy: Secondary | ICD-10-CM | POA: Insufficient documentation

## 2020-06-22 DIAGNOSIS — D509 Iron deficiency anemia, unspecified: Secondary | ICD-10-CM | POA: Diagnosis not present

## 2020-06-22 DIAGNOSIS — C787 Secondary malignant neoplasm of liver and intrahepatic bile duct: Secondary | ICD-10-CM | POA: Insufficient documentation

## 2020-06-22 DIAGNOSIS — Z5189 Encounter for other specified aftercare: Secondary | ICD-10-CM | POA: Insufficient documentation

## 2020-06-22 LAB — CBC WITH DIFFERENTIAL (CANCER CENTER ONLY)
Abs Immature Granulocytes: 0.01 10*3/uL (ref 0.00–0.07)
Basophils Absolute: 0 10*3/uL (ref 0.0–0.1)
Basophils Relative: 1 %
Eosinophils Absolute: 0 10*3/uL (ref 0.0–0.5)
Eosinophils Relative: 1 %
HCT: 34 % — ABNORMAL LOW (ref 36.0–46.0)
Hemoglobin: 10.8 g/dL — ABNORMAL LOW (ref 12.0–15.0)
Immature Granulocytes: 0 %
Lymphocytes Relative: 36 %
Lymphs Abs: 1.3 10*3/uL (ref 0.7–4.0)
MCH: 27.2 pg (ref 26.0–34.0)
MCHC: 31.8 g/dL (ref 30.0–36.0)
MCV: 85.6 fL (ref 80.0–100.0)
Monocytes Absolute: 0.2 10*3/uL (ref 0.1–1.0)
Monocytes Relative: 5 %
Neutro Abs: 2.1 10*3/uL (ref 1.7–7.7)
Neutrophils Relative %: 57 %
Platelet Count: 246 10*3/uL (ref 150–400)
RBC: 3.97 MIL/uL (ref 3.87–5.11)
RDW: 17.2 % — ABNORMAL HIGH (ref 11.5–15.5)
WBC Count: 3.6 10*3/uL — ABNORMAL LOW (ref 4.0–10.5)
nRBC: 0 % (ref 0.0–0.2)

## 2020-06-22 LAB — CMP (CANCER CENTER ONLY)
ALT: 28 U/L (ref 0–44)
AST: 28 U/L (ref 15–41)
Albumin: 3 g/dL — ABNORMAL LOW (ref 3.5–5.0)
Alkaline Phosphatase: 410 U/L — ABNORMAL HIGH (ref 38–126)
Anion gap: 12 (ref 5–15)
BUN: 9 mg/dL (ref 8–23)
CO2: 24 mmol/L (ref 22–32)
Calcium: 9.1 mg/dL (ref 8.9–10.3)
Chloride: 105 mmol/L (ref 98–111)
Creatinine: 0.67 mg/dL (ref 0.44–1.00)
GFR, Estimated: >60 mL/min (ref >60)
Glucose, Bld: 141 mg/dL — ABNORMAL HIGH (ref 70–99)
Potassium: 3.6 mmol/L (ref 3.5–5.1)
Sodium: 141 mmol/L (ref 135–145)
Total Bilirubin: 0.4 mg/dL (ref 0.3–1.2)
Total Protein: 7.1 g/dL (ref 6.5–8.1)

## 2020-06-22 LAB — CEA (IN HOUSE-CHCC): CEA (CHCC-In House): 5868.71 ng/mL — ABNORMAL HIGH (ref 0.00–5.00)

## 2020-06-22 LAB — TOTAL PROTEIN, URINE DIPSTICK: Protein, ur: 100 mg/dL — AB

## 2020-06-22 MED ORDER — ATROPINE SULFATE 1 MG/ML IJ SOLN
0.5000 mg | Freq: Once | INTRAMUSCULAR | Status: AC | PRN
  Administered 2020-06-22: 0.5 mg via INTRAVENOUS

## 2020-06-22 MED ORDER — PALONOSETRON HCL INJECTION 0.25 MG/5ML
INTRAVENOUS | Status: AC
  Filled 2020-06-22: qty 5

## 2020-06-22 MED ORDER — SODIUM CHLORIDE 0.9 % IV SOLN
Freq: Once | INTRAVENOUS | Status: DC
  Filled 2020-06-22: qty 250

## 2020-06-22 MED ORDER — SODIUM CHLORIDE 0.9 % IV SOLN
5.0000 mg/kg | Freq: Once | INTRAVENOUS | Status: AC
  Administered 2020-06-22: 350 mg via INTRAVENOUS
  Filled 2020-06-22: qty 14

## 2020-06-22 MED ORDER — SODIUM CHLORIDE 0.9 % IV SOLN
180.0000 mg/m2 | Freq: Once | INTRAVENOUS | Status: AC
  Administered 2020-06-22: 320 mg via INTRAVENOUS
  Filled 2020-06-22: qty 5

## 2020-06-22 MED ORDER — ATROPINE SULFATE 1 MG/ML IJ SOLN
INTRAMUSCULAR | Status: AC
  Filled 2020-06-22: qty 1

## 2020-06-22 MED ORDER — SODIUM CHLORIDE 0.9% FLUSH
10.0000 mL | INTRAVENOUS | Status: DC | PRN
  Administered 2020-06-22: 10 mL
  Filled 2020-06-22: qty 10

## 2020-06-22 MED ORDER — SODIUM CHLORIDE 0.9 % IV SOLN
300.0000 mg/m2 | Freq: Once | INTRAVENOUS | Status: AC
  Administered 2020-06-22: 522 mg via INTRAVENOUS
  Filled 2020-06-22: qty 26.1

## 2020-06-22 MED ORDER — PALONOSETRON HCL INJECTION 0.25 MG/5ML
0.2500 mg | Freq: Once | INTRAVENOUS | Status: AC
  Administered 2020-06-22: 0.25 mg via INTRAVENOUS

## 2020-06-22 MED ORDER — SODIUM CHLORIDE 0.9 % IV SOLN
10.0000 mg | Freq: Once | INTRAVENOUS | Status: AC
  Administered 2020-06-22: 10 mg via INTRAVENOUS
  Filled 2020-06-22: qty 10

## 2020-06-22 MED ORDER — SODIUM CHLORIDE 0.9 % IV SOLN
1600.0000 mg/m2 | INTRAVENOUS | Status: DC
  Administered 2020-06-22: 2800 mg via INTRAVENOUS
  Filled 2020-06-22: qty 56

## 2020-06-22 MED ORDER — SODIUM CHLORIDE 0.9 % IV SOLN
Freq: Once | INTRAVENOUS | Status: AC
  Filled 2020-06-22: qty 250

## 2020-06-22 NOTE — Progress Notes (Signed)
Patient stable upon leaving infusion room.  

## 2020-06-22 NOTE — Patient Instructions (Signed)

## 2020-06-22 NOTE — Patient Instructions (Signed)
[place] Discharge Instructions for Patients Receiving Chemotherapy  Today you received the following chemotherapy agents: Bevacizumab-bvzr (ZIRABEV), Irinotecan (CAMPTOSAR), Leucovorin & Flourouracil (ADRUCIL).  To help prevent nausea and vomiting after your treatment, we encourage you to take your nausea medication as directed.   If you develop nausea and vomiting that is not controlled by your nausea medication, call the clinic.   BELOW ARE SYMPTOMS THAT SHOULD BE REPORTED IMMEDIATELY:  *FEVER GREATER THAN 100.5 F  *CHILLS WITH OR WITHOUT FEVER  NAUSEA AND VOMITING THAT IS NOT CONTROLLED WITH YOUR NAUSEA MEDICATION  *UNUSUAL SHORTNESS OF BREATH  *UNUSUAL BRUISING OR BLEEDING  TENDERNESS IN MOUTH AND THROAT WITH OR WITHOUT PRESENCE OF ULCERS  *URINARY PROBLEMS  *BOWEL PROBLEMS  UNUSUAL RASH Items with * indicate a potential emergency and should be followed up as soon as possible.  Feel free to call the clinic should you have any questions or concerns. The clinic phone number is [phone].  Please show the [place] at check-in to the Emergency Department and triage nurse.

## 2020-06-22 NOTE — Progress Notes (Signed)
[place] OFFICE PROGRESS NOTE   Diagnosis: Colon cancer  INTERVAL HISTORY:   Ms. Wordell complete another cycle of FOLFIRI/Avastin on 06/07/2020.  No nausea/vomiting, mouth sores, or diarrhea.  She reports mild bleeding when she blows her nose.  She has mild bleeding with bowel movements and wiping.  She feels the perineum is irritated.  Neuropathy symptoms in the hands have partially improved.  Objective:  Vital signs in last 24 hours:  Blood pressure (!) 161/90, pulse 75, temperature 98.2 F (36.8 C), temperature source Tympanic, resp. rate 17, height 5' 2.5" (1.588 m), weight 156 lb 9.6 oz (71 kg), SpO2 100 %.    HEENT: No thrush or ulcers Resp: Lungs clear bilaterally Cardio: Regular rate and rhythm GI: The liver is palpable in the right upper abdomen, no splenomegaly Vascular: No leg edema  Skin: Mild hyperpigmentation of the hands, no erythema  Portacath/PICC-without erythema  Lab Results:  Lab Results  Component Value Date   WBC 3.6 (L) 06/22/2020   HGB 10.8 (L) 06/22/2020   HCT 34.0 (L) 06/22/2020   MCV 85.6 06/22/2020   PLT 246 06/22/2020   NEUTROABS 2.1 06/22/2020    CMP  Lab Results  Component Value Date   NA 141 06/22/2020   K 3.6 06/22/2020   CL 105 06/22/2020   CO2 24 06/22/2020   GLUCOSE 141 (H) 06/22/2020   BUN 9 06/22/2020   CREATININE 0.67 06/22/2020   CALCIUM 9.1 06/22/2020   PROT 7.1 06/22/2020   ALBUMIN 3.0 (L) 06/22/2020   AST 28 06/22/2020   ALT 28 06/22/2020   ALKPHOS 410 (H) 06/22/2020   BILITOT 0.4 06/22/2020   GFRNONAA >60 06/22/2020   GFRAA >60 03/30/2020    Lab Results  Component Value Date   CEA1 6,962.95 (H) 06/07/2020     Medications: I have reviewed the patient's current medications.   Assessment/Plan: 1.Stage IV adenocarcinoma of the cecum. She presented with a large cecal mass, extensive hepatic metastatic disease; mild thoracic and abdominal adenopathy. She also has several small nonspecific  pulmonary nodules. She was diagnosed in March 2021.   Colonoscopy 09/16/2019-fungating mass at the cecum, sessile polyps in the this sigmoid, transverse colon, and hepatic flexure, adenocarcinoma involving cecum biopsy, tubular adenoma and sessile serrated polyps  CTs chest, abdomen, and pelvis 10/08/2019-extensive liver metastases, thoracic/abdominal adenopathy, cecal mass, dilated and thin thickened appendix, nonspecific pulmonary nodules  Ultrasound-guided liver biopsy 10/13/2019-adenocarcinoma consistent with a metastatic colorectal primary, MSS, tumor mutation burden 3, KRAS Q61,  Cycle 1 FOLFOX 10/14/2019  Cycle 2 FOLFOX 10/27/2019  Cycle 3 FOLFOX 11/11/2019, Emend and Decadron added, 5-FU dose escalated  Cycle 4 FOLFOX 11/25/2019, Udenyca added  Cycle 5 FOLFOX 12/08/2019  Cycle 6 FOLFOX 12/23/2019  CT 01/04/2020-decreased size of multiple hepatic metastases, slight decrease in bulk of cecal mass, stable ileocecal adenopathy  Cycle 7 FOLFOX 01/06/2020  Cycle 8 FOLFOX 01/31/2020  Cycle 9 FOLFOX 02/17/2020 (oxaliplatin dose reduced due to borderline ANC, persistent cold sensitivity)  Cycle 10 FOLFOX 03/02/2020  Cycle 11 FOLFOX 03/16/2020  Cycle 12 FOLFOX 03/30/2020 (oxaliplatin held secondary to neuropathy)  CT abdomen/pelvis 04/19/2020-no significant change in the abnormal soft tissue lesion in the region of the cecal tip involving the ileocecal valve and appendiceal orifice; slight interval progression of numerous hepatic metastases; similar appearance of small lymph nodes in the ileocolic mesentery with adjacent irregular focus of soft tissue also stable  Cycle 1 FOLFIRI/Avastin 04/25/2020  Cycle 2 FOLFIRI/Avastin 05/11/2020  Cycle 3 FOLFIRI 05/25/2020 (Avastin held due  to proteinuria)  Cycle 4 FOLFIRI/Avastin 06/07/2020  Cycle 5 FOLFIRI/Avastin 06/22/2020 2) Iron deficiency anemia secondary to #1. 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 Diarrhea 01/20/2020. C. difficile negative    Disposition: Rachel Frank appears stable.  She will complete another cycle of FOLFIRI/Avastin today.  She will undergo a restaging CT evaluation after this cycle.  She will call for increased bleeding.  She declined rectal examination today.  Ms. Yung will return for an office visit in 2 weeks.  Betsy Coder, MD  06/22/2020  11:45 AM

## 2020-06-24 ENCOUNTER — Inpatient Hospital Stay: Payer: 59

## 2020-06-24 ENCOUNTER — Other Ambulatory Visit: Payer: Self-pay

## 2020-06-24 VITALS — BP 157/87 | HR 72 | Temp 97.8°F | Resp 18

## 2020-06-24 DIAGNOSIS — C19 Malignant neoplasm of rectosigmoid junction: Secondary | ICD-10-CM

## 2020-06-24 DIAGNOSIS — Z5112 Encounter for antineoplastic immunotherapy: Secondary | ICD-10-CM | POA: Diagnosis not present

## 2020-06-24 MED ORDER — HEPARIN SOD (PORK) LOCK FLUSH 100 UNIT/ML IV SOLN
500.0000 [IU] | Freq: Once | INTRAVENOUS | Status: AC | PRN
Start: 2020-06-24 — End: 2020-06-24
  Administered 2020-06-24: 14:00:00 500 [IU]
  Filled 2020-06-24: qty 5

## 2020-06-24 MED ORDER — PEGFILGRASTIM-CBQV 6 MG/0.6ML ~~LOC~~ SOSY
6.0000 mg | PREFILLED_SYRINGE | Freq: Once | SUBCUTANEOUS | Status: AC
  Administered 2020-06-24: 14:00:00 6 mg via SUBCUTANEOUS

## 2020-06-24 MED ORDER — SODIUM CHLORIDE 0.9% FLUSH
10.0000 mL | INTRAVENOUS | Status: DC | PRN
  Administered 2020-06-24: 14:00:00 10 mL
  Filled 2020-06-24: qty 10

## 2020-06-24 NOTE — Patient Instructions (Signed)
Implanted Port Home Guide An implanted port is a device that is placed under the skin. It is usually placed in the chest. The device can be used to give IV medicine, to take blood, or for dialysis. You may have an implanted port if:  You need IV medicine that would be irritating to the small veins in your hands or arms.  You need IV medicines, such as antibiotics, for a long period of time.  You need IV nutrition for a long period of time.  You need dialysis. Having a port means that your health care provider will not need to use the veins in your arms for these procedures. You may have fewer limitations when using a port than you would if you used other types of long-term IVs, and you will likely be able to return to normal activities after your incision heals. An implanted port has two main parts:  Reservoir. The reservoir is the part where a needle is inserted to give medicines or draw blood. The reservoir is round. After it is placed, it appears as a small, raised area under your skin.  Catheter. The catheter is a thin, flexible tube that connects the reservoir to a vein. Medicine that is inserted into the reservoir goes into the catheter and then into the vein. How is my port accessed? To access your port:  A numbing cream may be placed on the skin over the port site.  Your health care provider will put on a mask and sterile gloves.  The skin over your port will be cleaned carefully with a germ-killing soap and allowed to dry.  Your health care provider will gently pinch the port and insert a needle into it.  Your health care provider will check for a blood return to make sure the port is in the vein and is not clogged.  If your port needs to remain accessed to get medicine continuously (constant infusion), your health care provider will place a clear bandage (dressing) over the needle site. The dressing and needle will need to be changed every week, or as told by your health care  provider. What is flushing? Flushing helps keep the port from getting clogged. Follow instructions from your health care provider about how and when to flush the port. Ports are usually flushed with saline solution or a medicine called heparin. The need for flushing will depend on how the port is used:  If the port is only used from time to time to give medicines or draw blood, the port may need to be flushed: ? Before and after medicines have been given. ? Before and after blood has been drawn. ? As part of routine maintenance. Flushing may be recommended every 4-6 weeks.  If a constant infusion is running, the port may not need to be flushed.  Throw away any syringes in a disposal container that is meant for sharp items (sharps container). You can buy a sharps container from a pharmacy, or you can make one by using an empty hard plastic bottle with a cover. How long will my port stay implanted? The port can stay in for as long as your health care provider thinks it is needed. When it is time for the port to come out, a surgery will be done to remove it. The surgery will be similar to the procedure that was done to put the port in. Follow these instructions at home:   Flush your port as told by your health care provider.    If you need an infusion over several days, follow instructions from your health care provider about how to take care of your port site. Make sure you: ? Wash your hands with soap and water before you change your dressing. If soap and water are not available, use alcohol-based hand sanitizer. ? Change your dressing as told by your health care provider. ? Place any used dressings or infusion bags into a plastic bag. Throw that bag in the trash. ? Keep the dressing that covers the needle clean and dry. Do not get it wet. ? Do not use scissors or sharp objects near the tube. ? Keep the tube clamped, unless it is being used.  Check your port site every day for signs of  infection. Check for: ? Redness, swelling, or pain. ? Fluid or blood. ? Pus or a bad smell.  Protect the skin around the port site. ? Avoid wearing bra straps that rub or irritate the site. ? Protect the skin around your port from seat belts. Place a soft pad over your chest if needed.  Bathe or shower as told by your health care provider. The site may get wet as long as you are not actively receiving an infusion.  Return to your normal activities as told by your health care provider. Ask your health care provider what activities are safe for you.  Carry a medical alert card or wear a medical alert bracelet at all times. This will let health care providers know that you have an implanted port in case of an emergency. Get help right away if:  You have redness, swelling, or pain at the port site.  You have fluid or blood coming from your port site.  You have pus or a bad smell coming from the port site.  You have a fever. Summary  Implanted ports are usually placed in the chest for long-term IV access.  Follow instructions from your health care provider about flushing the port and changing bandages (dressings).  Take care of the area around your port by avoiding clothing that puts pressure on the area, and by watching for signs of infection.  Protect the skin around your port from seat belts. Place a soft pad over your chest if needed.  Get help right away if you have a fever or you have redness, swelling, pain, drainage, or a bad smell at the port site. This information is not intended to replace advice given to you by your health care provider. Make sure you discuss any questions you have with your health care provider. Document Revised: 10/23/2018 Document Reviewed: 08/03/2016 Elsevier Patient Education  2020 [employer] Pegfilgrastim injection What is this medicine? PEGFILGRASTIM (PEG fil gra stim) is a long-acting granulocyte colony-stimulating factor that stimulates the  growth of neutrophils, a type of white blood cell important in the body's fight against infection. It is used to reduce the incidence of fever and infection in patients with certain types of cancer who are receiving chemotherapy that affects the bone marrow, and to increase survival after being exposed to high doses of radiation. This medicine may be used for other purposes; ask your health care provider or pharmacist if you have questions. COMMON BRAND NAME(S): Fulphila, Neulasta, UDENYCA, Ziextenzo What should I tell my health care provider before I take this medicine? They need to know if you have any of these conditions:  kidney disease  latex allergy  ongoing radiation therapy  sickle cell disease  skin reactions to acrylic adhesives (On-Body   Injector only)  an unusual or allergic reaction to pegfilgrastim, filgrastim, other medicines, foods, dyes, or preservatives  pregnant or trying to get pregnant  breast-feeding How should I use this medicine? This medicine is for injection under the skin. If you get this medicine at home, you will be taught how to prepare and give the pre-filled syringe or how to use the On-body Injector. Refer to the patient Instructions for Use for detailed instructions. Use exactly as directed. Tell your healthcare provider immediately if you suspect that the On-body Injector may not have performed as intended or if you suspect the use of the On-body Injector resulted in a missed or partial dose. It is important that you put your used needles and syringes in a special sharps container. Do not put them in a trash can. If you do not have a sharps container, call your pharmacist or healthcare provider to get one. Talk to your pediatrician regarding the use of this medicine in children. While this drug may be prescribed for selected conditions, precautions do apply. Overdosage: If you think you have taken too much of this medicine contact a poison control center or  emergency room at once. NOTE: This medicine is only for you. Do not share this medicine with others. What if I miss a dose? It is important not to miss your dose. Call your doctor or health care professional if you miss your dose. If you miss a dose due to an On-body Injector failure or leakage, a new dose should be administered as soon as possible using a single prefilled syringe for manual use. What may interact with this medicine? Interactions have not been studied. Give your health care provider a list of all the medicines, herbs, non-prescription drugs, or dietary supplements you use. Also tell them if you smoke, drink alcohol, or use illegal drugs. Some items may interact with your medicine. This list may not describe all possible interactions. Give your health care provider a list of all the medicines, herbs, non-prescription drugs, or dietary supplements you use. Also tell them if you smoke, drink alcohol, or use illegal drugs. Some items may interact with your medicine. What should I watch for while using this medicine? You may need blood work done while you are taking this medicine. If you are going to need a MRI, CT scan, or other procedure, tell your doctor that you are using this medicine (On-Body Injector only). What side effects may I notice from receiving this medicine? Side effects that you should report to your doctor or health care professional as soon as possible:  allergic reactions like skin rash, itching or hives, swelling of the face, lips, or tongue  back pain  dizziness  fever  pain, redness, or irritation at site where injected  pinpoint red spots on the skin  red or dark-brown urine  shortness of breath or breathing problems  stomach or side pain, or pain at the shoulder  swelling  tiredness  trouble passing urine or change in the amount of urine Side effects that usually do not require medical attention (report to your doctor or health care  professional if they continue or are bothersome):  bone pain  muscle pain This list may not describe all possible side effects. Call your doctor for medical advice about side effects. You may report side effects to FDA at 1-800-FDA-1088. Where should I keep my medicine? Keep out of the reach of children. If you are using this medicine at home, you will be instructed   on how to store it. Throw away any unused medicine after the expiration date on the label. NOTE: This sheet is a summary. It may not cover all possible information. If you have questions about this medicine, talk to your doctor, pharmacist, or health care provider.  2020 Elsevier/Gold Standard (2017-10-06 16:57:08)  

## 2020-07-02 ENCOUNTER — Other Ambulatory Visit: Payer: Self-pay | Admitting: Oncology

## 2020-07-05 ENCOUNTER — Other Ambulatory Visit: Payer: 59

## 2020-07-05 ENCOUNTER — Inpatient Hospital Stay: Payer: 59

## 2020-07-05 ENCOUNTER — Other Ambulatory Visit: Payer: Self-pay

## 2020-07-05 ENCOUNTER — Inpatient Hospital Stay (HOSPITAL_BASED_OUTPATIENT_CLINIC_OR_DEPARTMENT_OTHER): Payer: 59 | Admitting: Nurse Practitioner

## 2020-07-05 ENCOUNTER — Ambulatory Visit: Payer: 59 | Admitting: Nurse Practitioner

## 2020-07-05 ENCOUNTER — Encounter (HOSPITAL_COMMUNITY): Payer: Self-pay

## 2020-07-05 ENCOUNTER — Encounter: Payer: Self-pay | Admitting: Nurse Practitioner

## 2020-07-05 ENCOUNTER — Ambulatory Visit (HOSPITAL_COMMUNITY)
Admission: RE | Admit: 2020-07-05 | Discharge: 2020-07-05 | Disposition: A | Discharge status: Home / Self Care | Payer: 59 | Source: Ambulatory Visit | Attending: Oncology | Admitting: Oncology

## 2020-07-05 DIAGNOSIS — D509 Iron deficiency anemia, unspecified: Secondary | ICD-10-CM

## 2020-07-05 DIAGNOSIS — C19 Malignant neoplasm of rectosigmoid junction: Secondary | ICD-10-CM

## 2020-07-05 DIAGNOSIS — Z5112 Encounter for antineoplastic immunotherapy: Secondary | ICD-10-CM | POA: Diagnosis not present

## 2020-07-05 LAB — CBC WITH DIFFERENTIAL (CANCER CENTER ONLY)
Abs Immature Granulocytes: 0.04 10*3/uL (ref 0.00–0.07)
Basophils Absolute: 0 10*3/uL (ref 0.0–0.1)
Basophils Relative: 1 %
Eosinophils Absolute: 0 10*3/uL (ref 0.0–0.5)
Eosinophils Relative: 0 %
HCT: 30.7 % — ABNORMAL LOW (ref 36.0–46.0)
Hemoglobin: 9.9 g/dL — ABNORMAL LOW (ref 12.0–15.0)
Immature Granulocytes: 1 %
Lymphocytes Relative: 22 %
Lymphs Abs: 1.4 10*3/uL (ref 0.7–4.0)
MCH: 27.4 pg (ref 26.0–34.0)
MCHC: 32.2 g/dL (ref 30.0–36.0)
MCV: 85 fL (ref 80.0–100.0)
Monocytes Absolute: 0.5 10*3/uL (ref 0.1–1.0)
Monocytes Relative: 8 %
Neutro Abs: 4.4 10*3/uL (ref 1.7–7.7)
Neutrophils Relative %: 68 %
Platelet Count: 240 10*3/uL (ref 150–400)
RBC: 3.61 MIL/uL — ABNORMAL LOW (ref 3.87–5.11)
RDW: 17.2 % — ABNORMAL HIGH (ref 11.5–15.5)
WBC Count: 6.4 10*3/uL (ref 4.0–10.5)
nRBC: 0 % (ref 0.0–0.2)

## 2020-07-05 LAB — CMP (CANCER CENTER ONLY)
ALT: 33 U/L (ref 0–44)
AST: 27 U/L (ref 15–41)
Albumin: 3 g/dL — ABNORMAL LOW (ref 3.5–5.0)
Alkaline Phosphatase: 418 U/L — ABNORMAL HIGH (ref 38–126)
Anion gap: 8 (ref 5–15)
BUN: 12 mg/dL (ref 8–23)
CO2: 24 mmol/L (ref 22–32)
Calcium: 9 mg/dL (ref 8.9–10.3)
Chloride: 106 mmol/L (ref 98–111)
Creatinine: 0.59 mg/dL (ref 0.44–1.00)
GFR, Estimated: >60 mL/min (ref >60)
Glucose, Bld: 94 mg/dL (ref 70–99)
Potassium: 3.8 mmol/L (ref 3.5–5.1)
Sodium: 138 mmol/L (ref 135–145)
Total Bilirubin: 0.4 mg/dL (ref 0.3–1.2)
Total Protein: 7 g/dL (ref 6.5–8.1)

## 2020-07-05 LAB — CEA (IN HOUSE-CHCC): CEA (CHCC-In House): 5244.77 ng/mL — ABNORMAL HIGH (ref 0.00–5.00)

## 2020-07-05 LAB — TOTAL PROTEIN, URINE DIPSTICK: Protein, ur: 30 mg/dL — AB

## 2020-07-05 MED ORDER — SODIUM CHLORIDE 0.9% FLUSH
10.0000 mL | Freq: Once | INTRAVENOUS | Status: AC
  Administered 2020-07-05: 09:00:00 10 mL
  Filled 2020-07-05: qty 10

## 2020-07-05 MED ORDER — PROCHLORPERAZINE MALEATE 10 MG PO TABS: 10.0000 mg | ORAL_TABLET | Freq: Four times a day (QID) | ORAL | 2 refills | Status: DC | PRN

## 2020-07-05 MED ORDER — HEPARIN SOD (PORK) LOCK FLUSH 100 UNIT/ML IV SOLN
500.0000 [IU] | Freq: Once | INTRAVENOUS | Status: AC
  Administered 2020-07-05: 08:00:00 500 [IU] via INTRAVENOUS

## 2020-07-05 MED ORDER — ATROPINE SULFATE 1 MG/ML IJ SOLN
0.5000 mg | Freq: Once | INTRAMUSCULAR | Status: AC | PRN
  Administered 2020-07-05: 13:00:00 0.5 mg via INTRAVENOUS

## 2020-07-05 MED ORDER — SODIUM CHLORIDE 0.9 % IV SOLN
5.0000 mg/kg | Freq: Once | INTRAVENOUS | Status: AC
  Administered 2020-07-05: 13:00:00 350 mg via INTRAVENOUS
  Filled 2020-07-05: qty 14

## 2020-07-05 MED ORDER — SODIUM CHLORIDE 0.9 % IV SOLN
Freq: Once | INTRAVENOUS | Status: AC
  Filled 2020-07-05: qty 250

## 2020-07-05 MED ORDER — SODIUM CHLORIDE 0.9 % IV SOLN
10.0000 mg | Freq: Once | INTRAVENOUS | Status: AC
  Administered 2020-07-05: 12:00:00 10 mg via INTRAVENOUS
  Filled 2020-07-05: qty 10

## 2020-07-05 MED ORDER — SODIUM CHLORIDE 0.9 % IV SOLN
1600.0000 mg/m2 | INTRAVENOUS | Status: DC
  Administered 2020-07-05: 15:00:00 2800 mg via INTRAVENOUS
  Filled 2020-07-05: qty 56

## 2020-07-05 MED ORDER — PALONOSETRON HCL INJECTION 0.25 MG/5ML
0.2500 mg | Freq: Once | INTRAVENOUS | Status: AC
  Administered 2020-07-05: 0.25 mg via INTRAVENOUS

## 2020-07-05 MED ORDER — PALONOSETRON HCL INJECTION 0.25 MG/5ML
INTRAVENOUS | Status: AC
  Filled 2020-07-05: qty 5

## 2020-07-05 MED ORDER — HEPARIN SOD (PORK) LOCK FLUSH 100 UNIT/ML IV SOLN
INTRAVENOUS | Status: AC
  Filled 2020-07-05: qty 5

## 2020-07-05 MED ORDER — ATROPINE SULFATE 1 MG/ML IJ SOLN
INTRAMUSCULAR | Status: AC
  Filled 2020-07-05: qty 1

## 2020-07-05 MED ORDER — SODIUM CHLORIDE 0.9 % IV SOLN
300.0000 mg/m2 | Freq: Once | INTRAVENOUS | Status: AC
  Administered 2020-07-05: 13:00:00 522 mg via INTRAVENOUS
  Filled 2020-07-05: qty 26.1

## 2020-07-05 MED ORDER — LIDOCAINE-PRILOCAINE 2.5-2.5 % EX CREA: 1.0000 "application " | TOPICAL_CREAM | CUTANEOUS | 2 refills | Status: DC | PRN

## 2020-07-05 MED ORDER — SODIUM CHLORIDE 0.9 % IV SOLN
180.0000 mg/m2 | Freq: Once | INTRAVENOUS | Status: AC
  Administered 2020-07-05: 13:00:00 320 mg via INTRAVENOUS
  Filled 2020-07-05: qty 15

## 2020-07-05 MED ORDER — IOHEXOL 300 MG/ML  SOLN
100.0000 mL | Freq: Once | INTRAMUSCULAR | Status: AC | PRN
  Administered 2020-07-05: 08:00:00 100 mL via INTRAVENOUS

## 2020-07-05 MED ORDER — DEXAMETHASONE 4 MG PO TABS: 4.0000 mg | ORAL_TABLET | ORAL | 1 refills | Status: DC

## 2020-07-05 NOTE — Patient Instructions (Signed)
[facility] Discharge Instructions for Patients Receiving Chemotherapy  Today you received the following chemotherapy agents: bevacizumab/irinotecan/leucovorin/fluorouracil.  To help prevent nausea and vomiting after your treatment, we encourage you to take your nausea medication as directed.   If you develop nausea and vomiting that is not controlled by your nausea medication, call the clinic.   BELOW ARE SYMPTOMS THAT SHOULD BE REPORTED IMMEDIATELY:  *FEVER GREATER THAN 100.5 F  *CHILLS WITH OR WITHOUT FEVER  NAUSEA AND VOMITING THAT IS NOT CONTROLLED WITH YOUR NAUSEA MEDICATION  *UNUSUAL SHORTNESS OF BREATH  *UNUSUAL BRUISING OR BLEEDING  TENDERNESS IN MOUTH AND THROAT WITH OR WITHOUT PRESENCE OF ULCERS  *URINARY PROBLEMS  *BOWEL PROBLEMS  UNUSUAL RASH Items with * indicate a potential emergency and should be followed up as soon as possible.  Feel free to call the clinic should you have any questions or concerns. The clinic phone number is [phone].  Please show the CHEMO ALERT CARD at check-in to the Emergency Department and triage nurse.   

## 2020-07-05 NOTE — Progress Notes (Addendum)
[facility] OFFICE PROGRESS NOTE   Diagnosis: Colon cancer  INTERVAL HISTORY:   [facility] Frank returns as scheduled.  She completed another cycle of FOLFIRI/Avastin 06/22/2020.  She denies significant nausea/vomiting.  No mouth sores.  She had some loose stools yesterday, estimates 6 or 7.  She typically has around 4.  She tends to note blood on the toilet tissue the first time she wipes after a bowel movement.  She notes blood with nose blowing.  She has had 1 mild nosebleed.  She has intermittent mild right-sided abdominal pain.  Objective:  Vital signs in last 24 hours:  Blood pressure (!) 154/93, pulse 81, temperature 97.8 F (36.6 C), temperature source Tympanic, resp. rate 20, height $RemoveBe'5\' 2"'OPPkpMnBk$  (1.575 m), weight 160 lb 4.8 oz (72.7 kg), SpO2 100 %.    Vascular: No leg edema. Neuro: Alert and oriented. Skin: No rash. Port-A-Cath without erythema.  Lab Results:  Lab Results  Component Value Date   WBC 6.4 07/05/2020   HGB 9.9 (L) 07/05/2020   HCT 30.7 (L) 07/05/2020   MCV 85.0 07/05/2020   PLT 240 07/05/2020   NEUTROABS 4.4 07/05/2020    Imaging:  CT ABDOMEN PELVIS W CONTRAST  Result Date: 07/05/2020 CLINICAL DATA:  Colorectal carcinoma.  Assess response to therapy. EXAM: CT ABDOMEN AND PELVIS WITH CONTRAST TECHNIQUE: Multidetector CT imaging of the abdomen and pelvis was performed using the standard protocol following bolus administration of intravenous contrast. CONTRAST:  126mL OMNIPAQUE IOHEXOL 300 MG/ML  SOLN COMPARISON:  04/19/2020 FINDINGS: Lower chest: No acute abnormality. Hepatobiliary: Extensive liver metastases involving both lobes of liver are again noted. -Index lesion within dome of left hepatic lobe measures 2.1 cm, image 15/2. Previously 2.1 cm -lateral segment left lobe of liver lesion measures 5.2 cm, image 22/2. Previously 6.4 cm. -conglomerate mass within the lateral right hepatic lobe measures 8.0 x 6.3 cm, image 25/2. This is compared with 10.2  by 7.9 cm previously. -dome of right hepatic lobe lesion measures 4.4 x 2.4 cm, image 36/4. Previously 5.0 x 3.0 cm Gallbladder appears collapsed.  No bile duct dilatation identified. Pancreas: Unremarkable. No pancreatic ductal dilatation or surrounding inflammatory changes. Spleen: Normal in size without focal abnormality. Adrenals/Urinary Tract: Normal appearance of the left adrenal gland. Unchanged right adrenal nodule measuring 1 cm, image 65/4. No kidney mass or hydronephrosis identified. The urinary bladder is unremarkable. Stomach/Bowel: Stomach appears normal. Mass at the base of cecum measures 3.6 x 2.8 cm, image 62/2. Unchanged. Persistent thickening of the appendix which measures 1.1 cm in diameter on today's exam, image 66/2. No small bowel wall thickening, inflammation or distension. Sigmoid diverticulosis is identified without acute inflammation. Vascular/Lymphatic: Aortic atherosclerosis. No aneurysm. No abdominopelvic adenopathy identified. Ileocolic mesenteric node measures 8 mm, image 55/2. Unchanged. Adjacent node with surrounding calcifications is also stable measuring 1.4 cm, image 52/2. Reproductive: Partially calcified fibroid uterus. No adnexal mass identified. Other: Small volume of fluid is identified along the inferior margin of the right hepatic lobe which is increased from previous exam, image 47/2. Small amount of free fluid within the dependent portion of the pelvis is unchanged. No focal fluid collections identified. No definite peritoneal nodule or mass identified. Musculoskeletal: No acute or significant osseous findings. IMPRESSION: 1. Overall there has been mild improvement in multifocal liver metastases. 2. Mass at the base of cecum is stable in the interval. There is persistent thickening of the appendix. 3. Small volume of fluid along the inferior margin of the right hepatic lobe  is increased from previous exam. Small amount of free fluid within the dependent portion of the  pelvis is unchanged. 4. Stable right adrenal nodule. 5. Aortic atherosclerosis. Aortic Atherosclerosis (ICD10-I70.0). Electronically Signed   By: Kerby Moors M.D.   On: 07/05/2020 09:57    Medications: I have reviewed the patient's current medications.  Assessment/Plan: 1.Stage IV adenocarcinoma of the cecum. She presented with a large cecal mass, extensive hepatic metastatic disease; mild thoracic and abdominal adenopathy. She also has several small nonspecific pulmonary nodules. She was diagnosed in March 2021.   Colonoscopy 09/16/2019-fungating mass at the cecum, sessile polyps in the this sigmoid, transverse colon, and hepatic flexure, adenocarcinoma involving cecum biopsy, tubular adenoma and sessile serrated polyps  CTs chest, abdomen, and pelvis 10/08/2019-extensive liver metastases, thoracic/abdominal adenopathy, cecal mass, dilated and thin thickened appendix, nonspecific pulmonary nodules  Ultrasound-guided liver biopsy 10/13/2019-adenocarcinoma consistent with a metastatic colorectal primary, MSS, tumor mutation burden 3, KRAS Q61,  Cycle 1 FOLFOX 10/14/2019  Cycle 2 FOLFOX 10/27/2019  Cycle 3 FOLFOX 11/11/2019, Emend and Decadron added, 5-FU dose escalated  Cycle 4 FOLFOX 11/25/2019, Udenyca added  Cycle 5 FOLFOX 12/08/2019  Cycle 6 FOLFOX 12/23/2019  CT 01/04/2020-decreased size of multiple hepatic metastases, slight decrease in bulk of cecal mass, stable ileocecal adenopathy  Cycle 7 FOLFOX 01/06/2020  Cycle 8 FOLFOX 01/31/2020  Cycle 9 FOLFOX 02/17/2020 (oxaliplatin dose reduced due to borderline ANC, persistent cold sensitivity)  Cycle 10 FOLFOX 03/02/2020  Cycle 11 FOLFOX 03/16/2020  Cycle 12 FOLFOX 03/30/2020 (oxaliplatin held secondary to neuropathy)  CT abdomen/pelvis 04/19/2020-no significant change in the abnormal soft tissue lesion in the region of the cecal tip involving the ileocecal valve and appendiceal orifice; slight interval progression of numerous hepatic  metastases; similar appearance of small lymph nodes in the ileocolic mesentery with adjacent irregular focus of soft tissue also stable  Cycle 1 FOLFIRI/Avastin 04/25/2020  Cycle 2 FOLFIRI/Avastin 05/11/2020  Cycle 3 FOLFIRI 05/25/2020 (Avastin held due to proteinuria)  Cycle 4 FOLFIRI/Avastin 06/07/2020  Cycle 5 FOLFIRI/Avastin 06/22/2020  CTs 07/05/2020-mild improvement in multifocal liver metastases.  Mass at the base of the cecum is stable.  Persistent thickening of the appendix.  Cycle 6 FOLFIRI/Avastin 07/05/2020 2) Iron deficiency anemia secondary to #1. 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 Diarrhea 01/20/2020. C. difficile negative   Disposition: Ms. Strick appears stable.  She has completed 5 cycles of FOLFIRI/Avastin.  Restaging CTs from earlier today show improvement in liver metastases.  Plan to continue treatment with FOLFIRI/Avastin, cycle 6 today.  We reviewed the CBC and chemistry panel from today.  Labs adequate to proceed with treatment.  She will return for lab, follow-up, cycle 7 FOLFIRI/Avastin in 2 weeks.  She will contact the office in the interim with any problems.  Patient seen with Dr. Benay Spice.  CT images reviewed on the computer with Ms. Finkel and her daughter.  Ned Card ANP/GNP-BC   07/05/2020  10:49 AM This was a shared visit with Ned Card.  We reviewed the restaging CT images and findings with Ms. Brossard and her daughter.  The CEA is lower.  The metastatic colon cancer appears to be responding to FOLFIRI/Avastin.  The plan is to continue the current treatment.  Julieanne Manson, MD

## 2020-07-06 ENCOUNTER — Telehealth: Payer: Self-pay | Admitting: Nurse Practitioner

## 2020-07-06 NOTE — Telephone Encounter (Signed)
Scheduled appointments per 12/22 los. Spoke to patient who is aware of appointments dates and times.  

## 2020-07-07 ENCOUNTER — Inpatient Hospital Stay: Payer: 59

## 2020-07-07 ENCOUNTER — Other Ambulatory Visit: Payer: Self-pay

## 2020-07-07 VITALS — BP 189/94 | HR 66 | Temp 97.8°F | Resp 17

## 2020-07-07 DIAGNOSIS — Z5112 Encounter for antineoplastic immunotherapy: Secondary | ICD-10-CM | POA: Diagnosis not present

## 2020-07-07 DIAGNOSIS — C19 Malignant neoplasm of rectosigmoid junction: Secondary | ICD-10-CM

## 2020-07-07 MED ORDER — PEGFILGRASTIM-CBQV 6 MG/0.6ML ~~LOC~~ SOSY
6.0000 mg | PREFILLED_SYRINGE | Freq: Once | SUBCUTANEOUS | Status: AC
  Administered 2020-07-07: 13:00:00 6 mg via SUBCUTANEOUS

## 2020-07-07 MED ORDER — PEGFILGRASTIM-CBQV 6 MG/0.6ML ~~LOC~~ SOSY
PREFILLED_SYRINGE | SUBCUTANEOUS | Status: AC
  Filled 2020-07-07: qty 0.6

## 2020-07-07 MED ORDER — SODIUM CHLORIDE 0.9% FLUSH
10.0000 mL | INTRAVENOUS | Status: DC | PRN
  Administered 2020-07-07: 13:00:00 10 mL
  Filled 2020-07-07: qty 10

## 2020-07-07 MED ORDER — HEPARIN SOD (PORK) LOCK FLUSH 100 UNIT/ML IV SOLN
500.0000 [IU] | Freq: Once | INTRAVENOUS | Status: AC | PRN
  Administered 2020-07-07: 13:00:00 500 [IU]
  Filled 2020-07-07: qty 5

## 2020-07-07 NOTE — Patient Instructions (Signed)

## 2020-07-15 ENCOUNTER — Other Ambulatory Visit: Payer: Self-pay | Admitting: Oncology

## 2020-07-20 ENCOUNTER — Inpatient Hospital Stay: Payer: 59

## 2020-07-20 ENCOUNTER — Other Ambulatory Visit: Payer: Self-pay

## 2020-07-20 ENCOUNTER — Inpatient Hospital Stay (HOSPITAL_BASED_OUTPATIENT_CLINIC_OR_DEPARTMENT_OTHER): Payer: 59 | Admitting: Oncology

## 2020-07-20 ENCOUNTER — Inpatient Hospital Stay: Payer: 59 | Attending: Physician Assistant

## 2020-07-20 VITALS — BP 136/79 | HR 87 | Temp 98.1°F | Resp 20 | Ht 62.0 in | Wt 156.4 lb

## 2020-07-20 DIAGNOSIS — R197 Diarrhea, unspecified: Secondary | ICD-10-CM | POA: Diagnosis not present

## 2020-07-20 DIAGNOSIS — R110 Nausea: Secondary | ICD-10-CM | POA: Diagnosis not present

## 2020-07-20 DIAGNOSIS — G893 Neoplasm related pain (acute) (chronic): Secondary | ICD-10-CM | POA: Insufficient documentation

## 2020-07-20 DIAGNOSIS — G620 Drug-induced polyneuropathy: Secondary | ICD-10-CM | POA: Insufficient documentation

## 2020-07-20 DIAGNOSIS — D509 Iron deficiency anemia, unspecified: Secondary | ICD-10-CM | POA: Diagnosis not present

## 2020-07-20 DIAGNOSIS — Z5111 Encounter for antineoplastic chemotherapy: Secondary | ICD-10-CM | POA: Diagnosis present

## 2020-07-20 DIAGNOSIS — Z5112 Encounter for antineoplastic immunotherapy: Secondary | ICD-10-CM | POA: Diagnosis present

## 2020-07-20 DIAGNOSIS — C19 Malignant neoplasm of rectosigmoid junction: Secondary | ICD-10-CM

## 2020-07-20 DIAGNOSIS — Z5189 Encounter for other specified aftercare: Secondary | ICD-10-CM | POA: Diagnosis not present

## 2020-07-20 DIAGNOSIS — Z95828 Presence of other vascular implants and grafts: Secondary | ICD-10-CM

## 2020-07-20 DIAGNOSIS — C180 Malignant neoplasm of cecum: Secondary | ICD-10-CM | POA: Diagnosis present

## 2020-07-20 LAB — CMP (CANCER CENTER ONLY)
ALT: 22 U/L (ref 0–44)
AST: 23 U/L (ref 15–41)
Albumin: 2.8 g/dL — ABNORMAL LOW (ref 3.5–5.0)
Alkaline Phosphatase: 397 U/L — ABNORMAL HIGH (ref 38–126)
Anion gap: 9 (ref 5–15)
BUN: 11 mg/dL (ref 8–23)
CO2: 24 mmol/L (ref 22–32)
Calcium: 9.4 mg/dL (ref 8.9–10.3)
Chloride: 104 mmol/L (ref 98–111)
Creatinine: 0.65 mg/dL (ref 0.44–1.00)
GFR, Estimated: >60 mL/min (ref >60)
Glucose, Bld: 90 mg/dL (ref 70–99)
Potassium: 3.8 mmol/L (ref 3.5–5.1)
Sodium: 137 mmol/L (ref 135–145)
Total Bilirubin: 1.2 mg/dL (ref 0.3–1.2)
Total Protein: 7.4 g/dL (ref 6.5–8.1)

## 2020-07-20 LAB — CBC WITH DIFFERENTIAL (CANCER CENTER ONLY)
Abs Immature Granulocytes: 0.01 10*3/uL (ref 0.00–0.07)
Basophils Absolute: 0 10*3/uL (ref 0.0–0.1)
Basophils Relative: 0 %
Eosinophils Absolute: 0 10*3/uL (ref 0.0–0.5)
Eosinophils Relative: 0 %
HCT: 29.8 % — ABNORMAL LOW (ref 36.0–46.0)
Hemoglobin: 9.3 g/dL — ABNORMAL LOW (ref 12.0–15.0)
Immature Granulocytes: 0 %
Lymphocytes Relative: 25 %
Lymphs Abs: 1.2 10*3/uL (ref 0.7–4.0)
MCH: 26.5 pg (ref 26.0–34.0)
MCHC: 31.2 g/dL (ref 30.0–36.0)
MCV: 84.9 fL (ref 80.0–100.0)
Monocytes Absolute: 0.7 10*3/uL (ref 0.1–1.0)
Monocytes Relative: 14 %
Neutro Abs: 2.9 10*3/uL (ref 1.7–7.7)
Neutrophils Relative %: 61 %
Platelet Count: 317 10*3/uL (ref 150–400)
RBC: 3.51 MIL/uL — ABNORMAL LOW (ref 3.87–5.11)
RDW: 16.8 % — ABNORMAL HIGH (ref 11.5–15.5)
WBC Count: 4.7 10*3/uL (ref 4.0–10.5)
nRBC: 0 % (ref 0.0–0.2)

## 2020-07-20 LAB — CEA (IN HOUSE-CHCC): CEA (CHCC-In House): 4235.47 ng/mL — ABNORMAL HIGH (ref 0.00–5.00)

## 2020-07-20 LAB — TOTAL PROTEIN, URINE DIPSTICK: Protein, ur: 100 mg/dL — AB

## 2020-07-20 MED ORDER — ATROPINE SULFATE 1 MG/ML IJ SOLN
INTRAMUSCULAR | Status: AC
  Filled 2020-07-20: qty 1

## 2020-07-20 MED ORDER — SODIUM CHLORIDE 0.9 % IV SOLN
300.0000 mg/m2 | Freq: Once | INTRAVENOUS | Status: AC
  Administered 2020-07-20: 522 mg via INTRAVENOUS
  Filled 2020-07-20: qty 26.1

## 2020-07-20 MED ORDER — ATROPINE SULFATE 1 MG/ML IJ SOLN
0.5000 mg | Freq: Once | INTRAMUSCULAR | Status: AC | PRN
  Administered 2020-07-20: 0.5 mg via INTRAVENOUS

## 2020-07-20 MED ORDER — PALONOSETRON HCL INJECTION 0.25 MG/5ML
0.2500 mg | Freq: Once | INTRAVENOUS | Status: AC
  Administered 2020-07-20: 0.25 mg via INTRAVENOUS

## 2020-07-20 MED ORDER — SODIUM CHLORIDE 0.9 % IV SOLN
180.0000 mg/m2 | Freq: Once | INTRAVENOUS | Status: AC
  Administered 2020-07-20: 320 mg via INTRAVENOUS
  Filled 2020-07-20: qty 15

## 2020-07-20 MED ORDER — SODIUM CHLORIDE 0.9 % IV SOLN
Freq: Once | INTRAVENOUS | Status: AC
  Filled 2020-07-20: qty 250

## 2020-07-20 MED ORDER — DEXAMETHASONE SODIUM PHOSPHATE 100 MG/10ML IJ SOLN
10.0000 mg | Freq: Once | INTRAMUSCULAR | Status: AC
  Administered 2020-07-20: 10 mg via INTRAVENOUS
  Filled 2020-07-20: qty 10

## 2020-07-20 MED ORDER — SODIUM CHLORIDE 0.9 % IV SOLN
5.0000 mg/kg | Freq: Once | INTRAVENOUS | Status: AC
  Administered 2020-07-20: 350 mg via INTRAVENOUS
  Filled 2020-07-20: qty 14

## 2020-07-20 MED ORDER — PALONOSETRON HCL INJECTION 0.25 MG/5ML
INTRAVENOUS | Status: AC
  Filled 2020-07-20: qty 5

## 2020-07-20 MED ORDER — SODIUM CHLORIDE 0.9% FLUSH
10.0000 mL | INTRAVENOUS | Status: DC | PRN
  Administered 2020-07-20: 10 mL via INTRAVENOUS
  Filled 2020-07-20: qty 10

## 2020-07-20 MED ORDER — SODIUM CHLORIDE 0.9 % IV SOLN
1600.0000 mg/m2 | INTRAVENOUS | Status: DC
  Administered 2020-07-20: 2800 mg via INTRAVENOUS
  Filled 2020-07-20: qty 56

## 2020-07-20 NOTE — Progress Notes (Signed)
Per Dr Truett Perna, ok to use urine protein from 12/22 to treat. Will have patient produce specimen before discharge today.  Ok to treat with urine protein 100 per Dr Truett Perna.

## 2020-07-20 NOTE — Patient Instructions (Signed)

## 2020-07-20 NOTE — Progress Notes (Signed)
[facility] OFFICE PROGRESS NOTE   Diagnosis: Colon cancer  INTERVAL HISTORY:   [name] completed another treatment with FOLFIRI/bevacizumab on 07/05/2020.  She reports one episode of diarrhea following chemotherapy.  She had mild nausea following chemotherapy.  She has anorexia.  No more rectal bleeding.  She continues to have neuropathy symptoms in the feet more than the hands.  Objective:  Vital signs in last 24 hours:  Blood pressure 136/79, pulse 87, temperature 98.1 F (36.7 C), temperature source Tympanic, resp. rate 20, height _0  (1.575 m), weight 156 lb 6.4 oz (70.9 kg), SpO2 100 %.    HEENT: No thrush or ulcers Resp: Lungs clear bilaterally Cardio: Regular rate and rhythm GI: Fullness in the right upper abdomen without a discrete liver edge, no splenomegaly, nontender Vascular: No leg edema  Skin: Mild hyperpigmentation of the hands  Portacath/PICC-without erythema  Lab Results:  Lab Results  Component Value Date   WBC 4.7 07/20/2020   HGB 9.3 (L) 07/20/2020   HCT 29.8 (L) 07/20/2020   MCV 84.9 07/20/2020   PLT 317 07/20/2020   NEUTROABS 2.9 07/20/2020    CMP  Lab Results  Component Value Date   NA 138 07/05/2020   K 3.8 07/05/2020   CL 106 07/05/2020   CO2 24 07/05/2020   GLUCOSE 94 07/05/2020   BUN 12 07/05/2020   CREATININE 0.59 07/05/2020   CALCIUM 9.0 07/05/2020   PROT 7.0 07/05/2020   ALBUMIN 3.0 (L) 07/05/2020   AST 27 07/05/2020   ALT 33 07/05/2020   ALKPHOS 418 (H) 07/05/2020   BILITOT 0.4 07/05/2020   GFRNONAA >60 07/05/2020   GFRAA >60 03/30/2020    Lab Results  Component Value Date   CEA1 5,244.77 (H) 07/05/2020    Medications: I have reviewed the patient's current medications.   Assessment/Plan: 1.Stage IV adenocarcinoma of the cecum. She presented with a large cecal mass, extensive hepatic metastatic disease; mild thoracic and abdominal adenopathy. She also has several small nonspecific pulmonary  nodules. She was diagnosed in March 2021.   Colonoscopy 09/16/2019-fungating mass at the cecum, sessile polyps in the this sigmoid, transverse colon, and hepatic flexure, adenocarcinoma involving cecum biopsy, tubular adenoma and sessile serrated polyps  CTs chest, abdomen, and pelvis 10/08/2019-extensive liver metastases, thoracic/abdominal adenopathy, cecal mass, dilated and thin thickened appendix, nonspecific pulmonary nodules  Ultrasound-guided liver biopsy 10/13/2019-adenocarcinoma consistent with a metastatic colorectal primary, MSS, tumor mutation burden 3, KRAS Q61,  Cycle 1 FOLFOX 10/14/2019  Cycle 2 FOLFOX 10/27/2019  Cycle 3 FOLFOX 11/11/2019, Emend and Decadron added, 5-FU dose escalated  Cycle 4 FOLFOX 11/25/2019, Udenyca added  Cycle 5 FOLFOX 12/08/2019  Cycle 6 FOLFOX 12/23/2019  CT 01/04/2020-decreased size of multiple hepatic metastases, slight decrease in bulk of cecal mass, stable ileocecal adenopathy  Cycle 7 FOLFOX 01/06/2020  Cycle 8 FOLFOX 01/31/2020  Cycle 9 FOLFOX 02/17/2020 (oxaliplatin dose reduced due to borderline ANC, persistent cold sensitivity)  Cycle 10 FOLFOX 03/02/2020  Cycle 11 FOLFOX 03/16/2020  Cycle 12 FOLFOX 03/30/2020 (oxaliplatin held secondary to neuropathy)  CT abdomen/pelvis 04/19/2020-no significant change in the abnormal soft tissue lesion in the region of the cecal tip involving the ileocecal valve and appendiceal orifice; slight interval progression of numerous hepatic metastases; similar appearance of small lymph nodes in the ileocolic mesentery with adjacent irregular focus of soft tissue also stable  Cycle 1 FOLFIRI/Avastin 04/25/2020  Cycle 2 FOLFIRI/Avastin 05/11/2020  Cycle 3 FOLFIRI 05/25/2020 (Avastin held due to proteinuria)  Cycle 4 FOLFIRI/Avastin 06/07/2020  Cycle 5 FOLFIRI/Avastin 06/22/2020  CTs 07/05/2020-mild improvement in multifocal liver metastases.  Mass at the base of the cecum is stable.  Persistent thickening of the  appendix.  Cycle 6 FOLFIRI/Avastin 07/05/2020  Cycle 7 FOLFIRI/Avastin 07/20/2020 2) Iron deficiency anemia secondary to #1. 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 Diarrhea 01/20/2020. C. difficile negative     Disposition: [name] appears unchanged.  She is tolerating the FOLFIRI/Avastin well.  She will complete another cycle today.  The CEA was lower when she was here on 07/05/2020.  There is no clinical evidence for disease progression.  [name] will return for an office visit and chemotherapy in 2 weeks.  She will be scheduled for restaging CTs after cycle 10 FOLFIRI/Avastin.  Rachel Coder, MD  07/20/2020  12:10 PM

## 2020-07-20 NOTE — Patient Instructions (Signed)
[facility] Discharge Instructions for Patients Receiving Chemotherapy  Today you received the following chemotherapy agents: bevacizumab/irinotecan/leucovorin/fluorouracil.  To help prevent nausea and vomiting after your treatment, we encourage you to take your nausea medication as directed.   If you develop nausea and vomiting that is not controlled by your nausea medication, call the clinic.   BELOW ARE SYMPTOMS THAT SHOULD BE REPORTED IMMEDIATELY:  *FEVER GREATER THAN 100.5 F  *CHILLS WITH OR WITHOUT FEVER  NAUSEA AND VOMITING THAT IS NOT CONTROLLED WITH YOUR NAUSEA MEDICATION  *UNUSUAL SHORTNESS OF BREATH  *UNUSUAL BRUISING OR BLEEDING  TENDERNESS IN MOUTH AND THROAT WITH OR WITHOUT PRESENCE OF ULCERS  *URINARY PROBLEMS  *BOWEL PROBLEMS  UNUSUAL RASH Items with * indicate a potential emergency and should be followed up as soon as possible.  Feel free to call the clinic should you have any questions or concerns. The clinic phone number is [phone].  Please show the CHEMO ALERT CARD at check-in to the Emergency Department and triage nurse.   

## 2020-07-21 ENCOUNTER — Telehealth: Payer: Self-pay | Admitting: Oncology

## 2020-07-21 NOTE — Telephone Encounter (Signed)
Scheduled appointments per 1/6 los. Will have updated calendar printed for patient at next visit.  

## 2020-07-22 ENCOUNTER — Other Ambulatory Visit: Payer: Self-pay

## 2020-07-22 ENCOUNTER — Ambulatory Visit: Payer: 59

## 2020-07-22 VITALS — BP 144/76 | HR 70 | Temp 97.6°F | Resp 18

## 2020-07-22 DIAGNOSIS — Z5112 Encounter for antineoplastic immunotherapy: Secondary | ICD-10-CM | POA: Diagnosis not present

## 2020-07-22 MED ORDER — HEPARIN SOD (PORK) LOCK FLUSH 100 UNIT/ML IV SOLN
500.0000 [IU] | Freq: Once | INTRAVENOUS | Status: AC | PRN
  Administered 2020-07-22: 500 [IU]
  Filled 2020-07-22: qty 5

## 2020-07-22 MED ORDER — PEGFILGRASTIM-CBQV 6 MG/0.6ML ~~LOC~~ SOSY
6.0000 mg | PREFILLED_SYRINGE | Freq: Once | SUBCUTANEOUS | Status: AC
  Administered 2020-07-22: 6 mg via SUBCUTANEOUS

## 2020-07-22 MED ORDER — SODIUM CHLORIDE 0.9% FLUSH
10.0000 mL | INTRAVENOUS | Status: DC | PRN
  Administered 2020-07-22: 10 mL
  Filled 2020-07-22: qty 10

## 2020-07-22 NOTE — Patient Instructions (Signed)
Tunneled Central Venous Catheter Flushing Guide  It is important to flush your tunneled central venous catheter each time you use it, both before and after you use it. Flushing your catheter will help prevent it from clogging. What are the risks? Risks may include:  Infection.  Air getting into the catheter and bloodstream. Supplies needed:  A clean pair of gloves.  A disinfecting wipe. Use an alcohol wipe, chlorhexidine wipe, or iodine wipe as told by your health care provider.  A 10 mL syringe that has been prefilled with saline solution.  An empty 10 mL syringe, if a substance called heparin was injected into your catheter. How to flush your catheter When you flush your catheter, make sure you follow any specific instructions from your health care provider or the manufacturer. These are general guidelines. Flushing your catheter before use If there is heparin in your catheter: 1. Wash your hands with soap and water. 2. Put on gloves. 3. Scrub the injection cap for a minimum of 15 seconds with a disinfecting wipe. 4. Unclamp the catheter. 5. Attach the empty syringe to the injection cap. 6. Pull the syringe plunger back and withdraw 10 mL of blood. 7. Place the syringe into an appropriate waste container. 8. Scrub the injection cap for 15 seconds with a disinfecting wipe. 9. Attach the prefilled syringe to the injection cap. 10. Flush the catheter by pushing the plunger forward until all the liquid from the syringe is in the catheter. 11. Remove the syringe from the injection cap. 12. Clamp the catheter. If there is no heparin in your catheter: 1. Wash your hands with soap and water. 2. Put on gloves. 3. Scrub the injection cap for 15 seconds with a disinfecting wipe. 4. Unclamp the catheter. 5. Attach the prefilled syringe to the injection cap. 6. Flush the catheter by pushing the plunger forward until 5 mL of the liquid from the syringe is in the catheter. 7. Pull back on  the syringe until you see blood in the catheter. 8. If you have been asked to collect any blood, follow your health care provider's instructions. Otherwise, flush the catheter with the rest of the solution from the syringe. 9. Remove the syringe from the injection cap. 10. Clamp the catheter.  Flushing your catheter after use 1. Wash your hands with soap and water. 2. Put on gloves. 3. Scrub the injection cap for 15 seconds with a disinfecting wipe. 4. Unclamp the catheter. 5. Attach the prefilled syringe to the injection cap. 6. Flush the catheter by pushing the plunger forward until all of the liquid from the syringe is in the catheter. 7. Remove the syringe from the injection cap. 8. Clamp the catheter. Problems and solutions  If blood cannot be completely cleared from the injection cap, you may need to have the injection cap replaced.  If the catheter is difficult to flush, use the pulsing method. The pulsing method involves pushing only a few milliliters of solution into the catheter at a time and pausing between pushes.  If you do not see blood in the catheter when you pull back on the syringe, change your body position, such as by raising your arms above your head. Take a deep breath and cough. Then, pull back on the syringe. If you still do not see blood, flush the catheter with a small amount of solution. Then, change positions again and take a breath or cough. Pull back on the syringe again. If you still do not see   blood, finish flushing the catheter and contact your health care provider. Do not use your catheter until your health care provider says it is okay. General tips  Have someone help you flush your catheter, if possible.  Do not force fluid through your catheter.  Do not use a syringe that is larger or smaller than 10 mL. Using a smaller syringe can make the catheter burst.  Do not use your catheter without flushing it first if it has heparin in it. Contact a health  care provider if:  You cannot see any blood in the catheter when you flush it before using it.  Your catheter is difficult to flush. Get help right away if:  You cannot flush the catheter.  The catheter leaks when you flush it or when there is fluid in it.  There are cracks or breaks in the catheter. Summary  It is important to flush your tunneled central venous catheter each time you use it, both before and after you use it.  Scrub the injection cap for 15 seconds with a disinfecting wipe before and after you flush it.  When you flush your catheter, make sure you follow any specific instructions from your health care provider or the manufacturer.  Get help right away if you cannot flush the catheter. This information is not intended to replace advice given to you by your health care provider. Make sure you discuss any questions you have with your health care provider. Document Revised: 03/26/2019 Document Reviewed: 09/16/2018 [employer]vier Patient Education  2020 [employer]  

## 2020-07-30 ENCOUNTER — Other Ambulatory Visit: Payer: Self-pay | Admitting: Oncology

## 2020-08-03 ENCOUNTER — Inpatient Hospital Stay: Payer: 59

## 2020-08-03 ENCOUNTER — Other Ambulatory Visit: Payer: Self-pay

## 2020-08-03 ENCOUNTER — Inpatient Hospital Stay (HOSPITAL_BASED_OUTPATIENT_CLINIC_OR_DEPARTMENT_OTHER): Payer: 59 | Admitting: Nurse Practitioner

## 2020-08-03 ENCOUNTER — Encounter: Payer: Self-pay | Admitting: Nurse Practitioner

## 2020-08-03 VITALS — BP 156/74 | HR 67

## 2020-08-03 VITALS — BP 150/90 | HR 77 | Temp 97.9°F | Resp 18 | Ht 62.0 in | Wt 159.0 lb

## 2020-08-03 DIAGNOSIS — C19 Malignant neoplasm of rectosigmoid junction: Secondary | ICD-10-CM

## 2020-08-03 DIAGNOSIS — Z5112 Encounter for antineoplastic immunotherapy: Secondary | ICD-10-CM | POA: Diagnosis not present

## 2020-08-03 DIAGNOSIS — D509 Iron deficiency anemia, unspecified: Secondary | ICD-10-CM

## 2020-08-03 LAB — CBC WITH DIFFERENTIAL (CANCER CENTER ONLY)
Abs Immature Granulocytes: 0.02 10*3/uL (ref 0.00–0.07)
Basophils Absolute: 0 10*3/uL (ref 0.0–0.1)
Basophils Relative: 0 %
Eosinophils Absolute: 0 10*3/uL (ref 0.0–0.5)
Eosinophils Relative: 1 %
HCT: 32.3 % — ABNORMAL LOW (ref 36.0–46.0)
Hemoglobin: 10.1 g/dL — ABNORMAL LOW (ref 12.0–15.0)
Immature Granulocytes: 0 %
Lymphocytes Relative: 36 %
Lymphs Abs: 1.6 10*3/uL (ref 0.7–4.0)
MCH: 27.1 pg (ref 26.0–34.0)
MCHC: 31.3 g/dL (ref 30.0–36.0)
MCV: 86.6 fL (ref 80.0–100.0)
Monocytes Absolute: 0.4 10*3/uL (ref 0.1–1.0)
Monocytes Relative: 8 %
Neutro Abs: 2.5 10*3/uL (ref 1.7–7.7)
Neutrophils Relative %: 55 %
Platelet Count: 254 10*3/uL (ref 150–400)
RBC: 3.73 MIL/uL — ABNORMAL LOW (ref 3.87–5.11)
RDW: 17.1 % — ABNORMAL HIGH (ref 11.5–15.5)
WBC Count: 4.5 10*3/uL (ref 4.0–10.5)
nRBC: 0 % (ref 0.0–0.2)

## 2020-08-03 LAB — CMP (CANCER CENTER ONLY)
ALT: 41 U/L (ref 0–44)
AST: 45 U/L — ABNORMAL HIGH (ref 15–41)
Albumin: 3 g/dL — ABNORMAL LOW (ref 3.5–5.0)
Alkaline Phosphatase: 445 U/L — ABNORMAL HIGH (ref 38–126)
Anion gap: 10 (ref 5–15)
BUN: 13 mg/dL (ref 8–23)
CO2: 25 mmol/L (ref 22–32)
Calcium: 9 mg/dL (ref 8.9–10.3)
Chloride: 107 mmol/L (ref 98–111)
Creatinine: 0.62 mg/dL (ref 0.44–1.00)
GFR, Estimated: >60 mL/min (ref >60)
Glucose, Bld: 90 mg/dL (ref 70–99)
Potassium: 3.9 mmol/L (ref 3.5–5.1)
Sodium: 142 mmol/L (ref 135–145)
Total Bilirubin: 0.3 mg/dL (ref 0.3–1.2)
Total Protein: 6.8 g/dL (ref 6.5–8.1)

## 2020-08-03 LAB — CEA (IN HOUSE-CHCC): CEA (CHCC-In House): 3648.33 ng/mL — ABNORMAL HIGH (ref 0.00–5.00)

## 2020-08-03 LAB — TOTAL PROTEIN, URINE DIPSTICK: Protein, ur: 100 mg/dL — AB

## 2020-08-03 MED ORDER — SODIUM CHLORIDE 0.9 % IV SOLN
10.0000 mg | Freq: Once | INTRAVENOUS | Status: AC
  Administered 2020-08-03: 10 mg via INTRAVENOUS
  Filled 2020-08-03: qty 10

## 2020-08-03 MED ORDER — ATROPINE SULFATE 1 MG/ML IJ SOLN
INTRAMUSCULAR | Status: AC
  Filled 2020-08-03: qty 1

## 2020-08-03 MED ORDER — SODIUM CHLORIDE 0.9% FLUSH
10.0000 mL | Freq: Once | INTRAVENOUS | Status: AC
  Administered 2020-08-03: 10 mL
  Filled 2020-08-03: qty 10

## 2020-08-03 MED ORDER — SODIUM CHLORIDE 0.9 % IV SOLN
Freq: Once | INTRAVENOUS | Status: AC
  Filled 2020-08-03: qty 250

## 2020-08-03 MED ORDER — IRINOTECAN HCL CHEMO INJECTION 100 MG/5ML
180.0000 mg/m2 | Freq: Once | INTRAVENOUS | Status: AC
  Administered 2020-08-03: 320 mg via INTRAVENOUS
  Filled 2020-08-03: qty 15

## 2020-08-03 MED ORDER — PALONOSETRON HCL INJECTION 0.25 MG/5ML
INTRAVENOUS | Status: AC
  Filled 2020-08-03: qty 5

## 2020-08-03 MED ORDER — PALONOSETRON HCL INJECTION 0.25 MG/5ML
0.2500 mg | Freq: Once | INTRAVENOUS | Status: AC
  Administered 2020-08-03: 0.25 mg via INTRAVENOUS

## 2020-08-03 MED ORDER — SODIUM CHLORIDE 0.9 % IV SOLN
300.0000 mg/m2 | Freq: Once | INTRAVENOUS | Status: AC
  Administered 2020-08-03: 522 mg via INTRAVENOUS
  Filled 2020-08-03: qty 26.1

## 2020-08-03 MED ORDER — ATROPINE SULFATE 1 MG/ML IJ SOLN
0.5000 mg | Freq: Once | INTRAMUSCULAR | Status: AC | PRN
  Administered 2020-08-03: 0.5 mg via INTRAVENOUS

## 2020-08-03 MED ORDER — SODIUM CHLORIDE 0.9 % IV SOLN
1600.0000 mg/m2 | INTRAVENOUS | Status: DC
  Administered 2020-08-03: 2800 mg via INTRAVENOUS
  Filled 2020-08-03: qty 56

## 2020-08-03 MED ORDER — SODIUM CHLORIDE 0.9 % IV SOLN
5.0000 mg/kg | Freq: Once | INTRAVENOUS | Status: AC
  Administered 2020-08-03: 350 mg via INTRAVENOUS
  Filled 2020-08-03: qty 14

## 2020-08-03 MED ORDER — SODIUM CHLORIDE 0.9% FLUSH
10.0000 mL | INTRAVENOUS | Status: DC | PRN
  Administered 2020-08-03: 10 mL
  Filled 2020-08-03: qty 10

## 2020-08-03 NOTE — Progress Notes (Signed)
[place] [facility] OFFICE PROGRESS NOTE   Diagnosis: Colon cancer  INTERVAL HISTORY:   Rachel Frank returns as scheduled.  She completed another cycle of FOLFIRI/bevacizumab 07/20/2020.  She has mild intermittent nausea.  No vomiting.  Home antiemetics effective.  No mouth sores.  Periodic loose stools.  No hand or foot pain or redness.  Intermittent blood with nose blowing.  No other bleeding.  No fever, cough, shortness of breath.  Abdominal pain continues to be improved.  Objective:  Vital signs in last 24 hours:  Blood pressure (!) 150/90, pulse 77, temperature 97.9 F (36.6 C), temperature source Tympanic, resp. rate 18, height $RemoveBe'5\' 2"'StrcUQzZM$  (1.575 m), weight 159 lb (72.1 kg), SpO2 100 %.    HEENT: No thrush or ulcers. Resp: Lungs clear bilaterally. Cardio: Regular rate and rhythm. GI: Fullness right upper abdomen, no definite hepatomegaly.  No splenomegaly.  Nontender. Vascular: No leg edema.  Calves soft and nontender.  Skin: Palms with mild hyperpigmentation.  No erythema. Port-A-Cath without erythema.   Lab Results:  Lab Results  Component Value Date   WBC 4.5 08/03/2020   HGB 10.1 (L) 08/03/2020   HCT 32.3 (L) 08/03/2020   MCV 86.6 08/03/2020   PLT 254 08/03/2020   NEUTROABS 2.5 08/03/2020    Imaging:  No results found.  Medications: I have reviewed the patient's current medications.  Assessment/Plan: 1.Stage IV adenocarcinoma of the cecum. She presented with a large cecal mass, extensive hepatic metastatic disease; mild thoracic and abdominal adenopathy. She also has several small nonspecific pulmonary nodules. She was diagnosed in March 2021.   Colonoscopy 09/16/2019-fungating mass at the cecum, sessile polyps in the this sigmoid, transverse colon, and hepatic flexure, adenocarcinoma involving cecum biopsy, tubular adenoma and sessile serrated polyps  CTs chest, abdomen, and pelvis 10/08/2019-extensive liver metastases, thoracic/abdominal adenopathy, cecal  mass, dilated and thin thickened appendix, nonspecific pulmonary nodules  Ultrasound-guided liver biopsy 10/13/2019-adenocarcinoma consistent with a metastatic colorectal primary, MSS, tumor mutation burden 3, KRAS Q61,  Cycle 1 FOLFOX 10/14/2019  Cycle 2 FOLFOX 10/27/2019  Cycle 3 FOLFOX 11/11/2019, Emend and Decadron added, 5-FU dose escalated  Cycle 4 FOLFOX 11/25/2019, Udenyca added  Cycle 5 FOLFOX 12/08/2019  Cycle 6 FOLFOX 12/23/2019  CT 01/04/2020-decreased size of multiple hepatic metastases, slight decrease in bulk of cecal mass, stable ileocecal adenopathy  Cycle 7 FOLFOX 01/06/2020  Cycle 8 FOLFOX 01/31/2020  Cycle 9 FOLFOX 02/17/2020 (oxaliplatin dose reduced due to borderline ANC, persistent cold sensitivity)  Cycle 10 FOLFOX 03/02/2020  Cycle 11 FOLFOX 03/16/2020  Cycle 12 FOLFOX 03/30/2020 (oxaliplatin held secondary to neuropathy)  CT abdomen/pelvis 04/19/2020-no significant change in the abnormal soft tissue lesion in the region of the cecal tip involving the ileocecal valve and appendiceal orifice; slight interval progression of numerous hepatic metastases; similar appearance of small lymph nodes in the ileocolic mesentery with adjacent irregular focus of soft tissue also stable  Cycle 1 FOLFIRI/Avastin 04/25/2020  Cycle 2 FOLFIRI/Avastin 05/11/2020  Cycle 3 FOLFIRI 05/25/2020 (Avastin held due to proteinuria)  Cycle 4 FOLFIRI/Avastin 06/07/2020  Cycle 5 FOLFIRI/Avastin 06/22/2020  CTs 07/05/2020-mild improvement in multifocal liver metastases.  Mass at the base of the cecum is stable.  Persistent thickening of the appendix.  Cycle 6 FOLFIRI/Avastin 07/05/2020  Cycle 7 FOLFIRI/Avastin 07/20/2020  Cycle 8 FOLFIRI/Avastin 08/03/2020 2) Iron deficiency anemia secondary to #1. 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 Diarrhea 01/20/2020. C. difficile negative    Disposition: Rachel Frank appears stable.  She has completed 7 cycles of FOLFIRI/Avastin.  Most recent CEA further improved.  No clinical evidence of disease progression.  Plan to proceed with cycle 8 today as scheduled.  Restaging scans after cycle 10.  We reviewed the CBC from today.  Counts adequate to proceed with treatment.  She has stable anemia.  She will return for lab, follow-up, FOLFIRI/Avastin in 2 weeks.  We are available to see her sooner if needed.    Ned Card ANP/GNP-BC   08/03/2020  9:46 AM

## 2020-08-03 NOTE — Patient Instructions (Signed)
[facility] Discharge Instructions for Patients Receiving Chemotherapy  Today you received the following chemotherapy agents: bevacizumab/irinotecan/leucovorin/fluorouracil.  To help prevent nausea and vomiting after your treatment, we encourage you to take your nausea medication as directed.   If you develop nausea and vomiting that is not controlled by your nausea medication, call the clinic.   BELOW ARE SYMPTOMS THAT SHOULD BE REPORTED IMMEDIATELY:  *FEVER GREATER THAN 100.5 F  *CHILLS WITH OR WITHOUT FEVER  NAUSEA AND VOMITING THAT IS NOT CONTROLLED WITH YOUR NAUSEA MEDICATION  *UNUSUAL SHORTNESS OF BREATH  *UNUSUAL BRUISING OR BLEEDING  TENDERNESS IN MOUTH AND THROAT WITH OR WITHOUT PRESENCE OF ULCERS  *URINARY PROBLEMS  *BOWEL PROBLEMS  UNUSUAL RASH Items with * indicate a potential emergency and should be followed up as soon as possible.  Feel free to call the clinic should you have any questions or concerns. The clinic phone number is [phone].  Please show the CHEMO ALERT CARD at check-in to the Emergency Department and triage nurse.   

## 2020-08-03 NOTE — Progress Notes (Signed)
Per Dr. Benay Spice ok to treat with urine protein 100 today.

## 2020-08-04 ENCOUNTER — Telehealth: Payer: Self-pay | Admitting: Nurse Practitioner

## 2020-08-04 NOTE — Telephone Encounter (Signed)
Scheduled appointments per 1/20 los. Will have updated calendar printed for patient.  

## 2020-08-05 ENCOUNTER — Inpatient Hospital Stay: Payer: 59

## 2020-08-05 ENCOUNTER — Other Ambulatory Visit: Payer: Self-pay

## 2020-08-05 VITALS — BP 141/94 | HR 66 | Temp 97.6°F | Resp 18

## 2020-08-05 DIAGNOSIS — C19 Malignant neoplasm of rectosigmoid junction: Secondary | ICD-10-CM

## 2020-08-05 DIAGNOSIS — Z5112 Encounter for antineoplastic immunotherapy: Secondary | ICD-10-CM | POA: Diagnosis not present

## 2020-08-05 MED ORDER — SODIUM CHLORIDE 0.9% FLUSH
10.0000 mL | INTRAVENOUS | Status: DC | PRN
  Administered 2020-08-05: 10 mL
  Filled 2020-08-05: qty 10

## 2020-08-05 MED ORDER — HEPARIN SOD (PORK) LOCK FLUSH 100 UNIT/ML IV SOLN
500.0000 [IU] | Freq: Once | INTRAVENOUS | Status: AC | PRN
  Administered 2020-08-05: 500 [IU]
  Filled 2020-08-05: qty 5

## 2020-08-05 MED ORDER — PEGFILGRASTIM-CBQV 6 MG/0.6ML ~~LOC~~ SOSY
6.0000 mg | PREFILLED_SYRINGE | Freq: Once | SUBCUTANEOUS | Status: AC
  Administered 2020-08-05: 6 mg via SUBCUTANEOUS

## 2020-08-05 NOTE — Patient Instructions (Signed)
Pegfilgrastim injection What is this medicine? PEGFILGRASTIM (PEG fil gra stim) is a long-acting granulocyte colony-stimulating factor that stimulates the growth of neutrophils, a type of white blood cell important in the body's fight against infection. It is used to reduce the incidence of fever and infection in patients with certain types of cancer who are receiving chemotherapy that affects the bone marrow, and to increase survival after being exposed to high doses of radiation. This medicine may be used for other purposes; ask your health care provider or pharmacist if you have questions. COMMON BRAND NAME(S): Fulphila, Neulasta, Nyvepria, UDENYCA, Ziextenzo What should I tell my health care provider before I take this medicine? They need to know if you have any of these conditions:  kidney disease  latex allergy  ongoing radiation therapy  sickle cell disease  skin reactions to acrylic adhesives (On-Body Injector only)  an unusual or allergic reaction to pegfilgrastim, filgrastim, other medicines, foods, dyes, or preservatives  pregnant or trying to get pregnant  breast-feeding How should I use this medicine? This medicine is for injection under the skin. If you get this medicine at home, you will be taught how to prepare and give the pre-filled syringe or how to use the On-body Injector. Refer to the patient Instructions for Use for detailed instructions. Use exactly as directed. Tell your healthcare provider immediately if you suspect that the On-body Injector may not have performed as intended or if you suspect the use of the On-body Injector resulted in a missed or partial dose. It is important that you put your used needles and syringes in a special sharps container. Do not put them in a trash can. If you do not have a sharps container, call your pharmacist or healthcare provider to get one. Talk to your pediatrician regarding the use of this medicine in children. While this drug  may be prescribed for selected conditions, precautions do apply. Overdosage: If you think you have taken too much of this medicine contact a poison control center or emergency room at once. NOTE: This medicine is only for you. Do not share this medicine with others. What if I miss a dose? It is important not to miss your dose. Call your doctor or health care professional if you miss your dose. If you miss a dose due to an On-body Injector failure or leakage, a new dose should be administered as soon as possible using a single prefilled syringe for manual use. What may interact with this medicine? Interactions have not been studied. This list may not describe all possible interactions. Give your health care provider a list of all the medicines, herbs, non-prescription drugs, or dietary supplements you use. Also tell them if you smoke, drink alcohol, or use illegal drugs. Some items may interact with your medicine. What should I watch for while using this medicine? Your condition will be monitored carefully while you are receiving this medicine. You may need blood work done while you are taking this medicine. Talk to your health care provider about your risk of cancer. You may be more at risk for certain types of cancer if you take this medicine. If you are going to need a MRI, CT scan, or other procedure, tell your doctor that you are using this medicine (On-Body Injector only). What side effects may I notice from receiving this medicine? Side effects that you should report to your doctor or health care professional as soon as possible:  allergic reactions (skin rash, itching or hives, swelling of   the face, lips, or tongue)  back pain  dizziness  fever  pain, redness, or irritation at site where injected  pinpoint red spots on the skin  red or dark-brown urine  shortness of breath or breathing problems  stomach or side pain, or pain at the shoulder  swelling  tiredness  trouble  passing urine or change in the amount of urine  unusual bruising or bleeding Side effects that usually do not require medical attention (report to your doctor or health care professional if they continue or are bothersome):  bone pain  muscle pain This list may not describe all possible side effects. Call your doctor for medical advice about side effects. You may report side effects to FDA at 1-800-FDA-1088. Where should I keep my medicine? Keep out of the reach of children. If you are using this medicine at home, you will be instructed on how to store it. Throw away any unused medicine after the expiration date on the label. NOTE: This sheet is a summary. It may not cover all possible information. If you have questions about this medicine, talk to your doctor, pharmacist, or health care provider.  2021 Elsevier/Gold Standard (2019-07-23 13:20:51)  

## 2020-08-11 ENCOUNTER — Other Ambulatory Visit: Payer: Self-pay | Admitting: Oncology

## 2020-08-16 ENCOUNTER — Telehealth: Payer: Self-pay | Admitting: Oncology

## 2020-08-16 ENCOUNTER — Other Ambulatory Visit: Payer: Self-pay | Admitting: Oncology

## 2020-08-16 ENCOUNTER — Telehealth: Payer: Self-pay | Admitting: *Deleted

## 2020-08-16 DIAGNOSIS — R059 Cough, unspecified: Secondary | ICD-10-CM

## 2020-08-16 MED ORDER — HYDROCODONE-HOMATROPINE 5-1.5 MG/5ML PO SYRP: 5.0000 mL | ORAL_SOLUTION | Freq: Every evening | ORAL | 0 refills | Status: DC | PRN

## 2020-08-16 NOTE — Telephone Encounter (Signed)
Patient called to report she developed "cold symptoms" on 1/30 and was tested for COVID at site at [facility]. Results returned + today. Her daughter had tested + last week. Denies any shortness of breath or fever. Instructed to support symptoms with OTC meds and go to ER if she gets fever or shortness of breath. Cancelled her chemo for 2/3 and 4/17 due to policy of 21 days from diagnosis to return to cancer center. She understands and agrees. Scheduling message to move lab/flush/OV/chemo to 09/05/20. Registered her for potential treatment with monoclonal antibody with [facility] at instruction of Ned Card, NP.

## 2020-08-16 NOTE — Telephone Encounter (Signed)
Scheduled appt per 2/2 sch msg - pt is aware of new appt time /date

## 2020-08-17 ENCOUNTER — Ambulatory Visit: Payer: 59 | Admitting: Oncology

## 2020-08-17 ENCOUNTER — Ambulatory Visit: Payer: 59

## 2020-08-17 ENCOUNTER — Other Ambulatory Visit: Payer: 59

## 2020-08-19 ENCOUNTER — Other Ambulatory Visit: Payer: Self-pay | Admitting: Physician Assistant

## 2020-08-19 ENCOUNTER — Ambulatory Visit (HOSPITAL_COMMUNITY)
Admission: RE | Admit: 2020-08-19 | Discharge: 2020-08-19 | Disposition: A | Discharge status: Home / Self Care | Payer: 59 | Source: Ambulatory Visit | Attending: Pulmonary Disease | Admitting: Pulmonary Disease

## 2020-08-19 DIAGNOSIS — U071 COVID-19: Secondary | ICD-10-CM

## 2020-08-19 DIAGNOSIS — Z6830 Body mass index (BMI) 30.0-30.9, adult: Secondary | ICD-10-CM

## 2020-08-19 DIAGNOSIS — I10 Essential (primary) hypertension: Secondary | ICD-10-CM | POA: Diagnosis not present

## 2020-08-19 DIAGNOSIS — C19 Malignant neoplasm of rectosigmoid junction: Secondary | ICD-10-CM | POA: Diagnosis not present

## 2020-08-19 MED ORDER — ALBUTEROL SULFATE HFA 108 (90 BASE) MCG/ACT IN AERS: 2.0000 | INHALATION_SPRAY | Freq: Once | RESPIRATORY_TRACT | Status: DC | PRN

## 2020-08-19 MED ORDER — SOTROVIMAB 500 MG/8ML IV SOLN
500.0000 mg | Freq: Once | INTRAVENOUS | Status: AC
  Administered 2020-08-19: 500 mg via INTRAVENOUS

## 2020-08-19 MED ORDER — DIPHENHYDRAMINE HCL 50 MG/ML IJ SOLN: 50.0000 mg | Freq: Once | INTRAMUSCULAR | Status: DC | PRN

## 2020-08-19 MED ORDER — SODIUM CHLORIDE 0.9 % IV SOLN: INTRAVENOUS | Status: DC | PRN

## 2020-08-19 MED ORDER — EPINEPHRINE 0.3 MG/0.3ML IJ SOAJ: 0.3000 mg | Freq: Once | INTRAMUSCULAR | Status: DC | PRN

## 2020-08-19 MED ORDER — FAMOTIDINE IN NACL 20-0.9 MG/50ML-% IV SOLN: 20.0000 mg | Freq: Once | INTRAVENOUS | Status: DC | PRN

## 2020-08-19 MED ORDER — METHYLPREDNISOLONE SODIUM SUCC 125 MG IJ SOLR: 125.0000 mg | Freq: Once | INTRAMUSCULAR | Status: DC | PRN

## 2020-08-19 NOTE — Progress Notes (Signed)
Patient reviewed Fact Sheet for Patients, Parents, and Caregivers for Emergency Use Authorization (EUA) of sotrovimab for the Treatment of Coronavirus. Patient also reviewed and is agreeable to the estimated cost of treatment. Patient is agreeable to proceed.   

## 2020-08-19 NOTE — Discharge Instructions (Signed)

## 2020-08-19 NOTE — Progress Notes (Signed)
I connected by phone with Rachel Frank on 08/19/2020 at 10:32 AM to discuss the potential use of a new treatment for mild to moderate COVID-19 viral infection in non-hospitalized patients.  This patient is a 60 y.o. female that meets the FDA criteria for Emergency Use Authorization of COVID monoclonal antibody sotrovimab.  Has a (+) direct SARS-CoV-2 viral test result  Has mild or moderate COVID-19   Is NOT hospitalized due to COVID-19  Is within 10 days of symptom onset  Has at least one of the high risk factor(s) for progression to severe COVID-19 and/or hospitalization as defined in EUA.  Specific high risk criteria : BMI > 25, Immunosuppressive Disease or Treatment, Cardiovascular disease or hypertension and Other high risk medical condition per CDC:  high SVI   I have spoken and communicated the following to the patient or parent/caregiver regarding COVID monoclonal antibody treatment:  1. FDA has authorized the emergency use for the treatment of mild to moderate COVID-19 in adults and pediatric patients with positive results of direct SARS-CoV-2 viral testing who are 49 years of age and older weighing at least 40 kg, and who are at high risk for progressing to severe COVID-19 and/or hospitalization.  2. The significant known and potential risks and benefits of COVID monoclonal antibody, and the extent to which such potential risks and benefits are unknown.  3. Information on available alternative treatments and the risks and benefits of those alternatives, including clinical trials.  4. Patients treated with COVID monoclonal antibody should continue to self-isolate and use infection control measures (e.g., wear mask, isolate, social distance, avoid sharing personal items, clean and disinfect "high touch" surfaces, and frequent handwashing) according to CDC guidelines.   5. The patient or parent/caregiver has the option to accept or refuse COVID monoclonal antibody treatment.  After  reviewing this information with the patient, the patient has agreed to receive one of the available covid 19 monoclonal antibodies and will be provided an appropriate fact sheet prior to infusion.   Sx onset 1/30. Vaccinated but ongoing cancer therapy. Set up for infusion today at 12:30pm. Will bring in a copy of + test.   Angelena Form, PA-C 08/19/2020 10:32 AM

## 2020-08-31 ENCOUNTER — Other Ambulatory Visit: Payer: 59

## 2020-08-31 ENCOUNTER — Ambulatory Visit: Payer: 59

## 2020-08-31 ENCOUNTER — Ambulatory Visit: Payer: 59 | Admitting: Oncology

## 2020-09-05 ENCOUNTER — Telehealth: Payer: Self-pay | Admitting: *Deleted

## 2020-09-05 ENCOUNTER — Encounter (HOSPITAL_COMMUNITY): Payer: Self-pay

## 2020-09-05 ENCOUNTER — Inpatient Hospital Stay: Payer: 59

## 2020-09-05 ENCOUNTER — Other Ambulatory Visit: Payer: Self-pay | Admitting: Oncology

## 2020-09-05 ENCOUNTER — Ambulatory Visit (HOSPITAL_COMMUNITY)
Admission: RE | Admit: 2020-09-05 | Discharge: 2020-09-05 | Disposition: A | Discharge status: Home / Self Care | Payer: 59 | Source: Ambulatory Visit | Attending: Oncology | Admitting: Oncology

## 2020-09-05 ENCOUNTER — Other Ambulatory Visit: Payer: Self-pay

## 2020-09-05 ENCOUNTER — Inpatient Hospital Stay: Payer: 59 | Attending: Physician Assistant

## 2020-09-05 ENCOUNTER — Inpatient Hospital Stay (HOSPITAL_BASED_OUTPATIENT_CLINIC_OR_DEPARTMENT_OTHER): Payer: 59 | Admitting: Oncology

## 2020-09-05 VITALS — BP 148/85 | HR 98 | Temp 97.9°F | Resp 18 | Ht 62.0 in | Wt 160.2 lb

## 2020-09-05 DIAGNOSIS — C180 Malignant neoplasm of cecum: Secondary | ICD-10-CM | POA: Insufficient documentation

## 2020-09-05 DIAGNOSIS — Z5189 Encounter for other specified aftercare: Secondary | ICD-10-CM | POA: Diagnosis not present

## 2020-09-05 DIAGNOSIS — C19 Malignant neoplasm of rectosigmoid junction: Secondary | ICD-10-CM

## 2020-09-05 DIAGNOSIS — G893 Neoplasm related pain (acute) (chronic): Secondary | ICD-10-CM | POA: Insufficient documentation

## 2020-09-05 DIAGNOSIS — D509 Iron deficiency anemia, unspecified: Secondary | ICD-10-CM | POA: Diagnosis not present

## 2020-09-05 DIAGNOSIS — G620 Drug-induced polyneuropathy: Secondary | ICD-10-CM | POA: Diagnosis not present

## 2020-09-05 DIAGNOSIS — R197 Diarrhea, unspecified: Secondary | ICD-10-CM | POA: Insufficient documentation

## 2020-09-05 DIAGNOSIS — Z5111 Encounter for antineoplastic chemotherapy: Secondary | ICD-10-CM | POA: Diagnosis present

## 2020-09-05 DIAGNOSIS — R079 Chest pain, unspecified: Secondary | ICD-10-CM

## 2020-09-05 DIAGNOSIS — R110 Nausea: Secondary | ICD-10-CM | POA: Insufficient documentation

## 2020-09-05 DIAGNOSIS — Z5112 Encounter for antineoplastic immunotherapy: Secondary | ICD-10-CM | POA: Diagnosis present

## 2020-09-05 HISTORY — DX: Malignant (primary) neoplasm, unspecified: C80.1

## 2020-09-05 LAB — URINALYSIS, COMPLETE (UACMP) WITH MICROSCOPIC
Bilirubin Urine: NEGATIVE
Glucose, UA: NEGATIVE mg/dL
Hgb urine dipstick: NEGATIVE
Ketones, ur: NEGATIVE mg/dL
Leukocytes,Ua: NEGATIVE
Nitrite: NEGATIVE
Protein, ur: >=300 mg/dL — AB
Specific Gravity, Urine: 1.016 (ref 1.005–1.030)
pH: 7 (ref 5.0–8.0)

## 2020-09-05 LAB — CBC WITH DIFFERENTIAL (CANCER CENTER ONLY)
Abs Immature Granulocytes: 0.02 10*3/uL (ref 0.00–0.07)
Basophils Absolute: 0.1 10*3/uL (ref 0.0–0.1)
Basophils Relative: 1 %
Eosinophils Absolute: 0.1 10*3/uL (ref 0.0–0.5)
Eosinophils Relative: 1 %
HCT: 32.4 % — ABNORMAL LOW (ref 36.0–46.0)
Hemoglobin: 10.4 g/dL — ABNORMAL LOW (ref 12.0–15.0)
Immature Granulocytes: 0 %
Lymphocytes Relative: 22 %
Lymphs Abs: 1.6 10*3/uL (ref 0.7–4.0)
MCH: 26.5 pg (ref 26.0–34.0)
MCHC: 32.1 g/dL (ref 30.0–36.0)
MCV: 82.7 fL (ref 80.0–100.0)
Monocytes Absolute: 0.9 10*3/uL (ref 0.1–1.0)
Monocytes Relative: 12 %
Neutro Abs: 4.5 10*3/uL (ref 1.7–7.7)
Neutrophils Relative %: 64 %
Platelet Count: 340 10*3/uL (ref 150–400)
RBC: 3.92 MIL/uL (ref 3.87–5.11)
RDW: 15.9 % — ABNORMAL HIGH (ref 11.5–15.5)
WBC Count: 7.1 10*3/uL (ref 4.0–10.5)
nRBC: 0 % (ref 0.0–0.2)

## 2020-09-05 LAB — CMP (CANCER CENTER ONLY)
ALT: 50 U/L — ABNORMAL HIGH (ref 0–44)
AST: 65 U/L — ABNORMAL HIGH (ref 15–41)
Albumin: 2.9 g/dL — ABNORMAL LOW (ref 3.5–5.0)
Alkaline Phosphatase: 614 U/L — ABNORMAL HIGH (ref 38–126)
Anion gap: 8 (ref 5–15)
BUN: 9 mg/dL (ref 8–23)
CO2: 25 mmol/L (ref 22–32)
Calcium: 9.4 mg/dL (ref 8.9–10.3)
Chloride: 103 mmol/L (ref 98–111)
Creatinine: 0.64 mg/dL (ref 0.44–1.00)
GFR, Estimated: >60 mL/min (ref >60)
Glucose, Bld: 96 mg/dL (ref 70–99)
Potassium: 4.1 mmol/L (ref 3.5–5.1)
Sodium: 136 mmol/L (ref 135–145)
Total Bilirubin: 0.8 mg/dL (ref 0.3–1.2)
Total Protein: 8.9 g/dL — ABNORMAL HIGH (ref 6.5–8.1)

## 2020-09-05 LAB — CEA (IN HOUSE-CHCC): CEA (CHCC-In House): 3470.62 ng/mL — ABNORMAL HIGH (ref 0.00–5.00)

## 2020-09-05 LAB — TOTAL PROTEIN, URINE DIPSTICK: Protein, ur: 300 mg/dL — AB

## 2020-09-05 MED ORDER — SODIUM CHLORIDE 0.9% FLUSH
10.0000 mL | INTRAVENOUS | Status: DC | PRN
  Filled 2020-09-05: qty 10

## 2020-09-05 MED ORDER — PROMETHAZINE-CODEINE 6.25-10 MG/5ML PO SOLN: 5.0000 mL | Freq: Three times a day (TID) | ORAL | 0 refills | Status: DC | PRN

## 2020-09-05 MED ORDER — PALONOSETRON HCL INJECTION 0.25 MG/5ML
INTRAVENOUS | Status: AC
  Filled 2020-09-05: qty 5

## 2020-09-05 MED ORDER — HEPARIN SOD (PORK) LOCK FLUSH 100 UNIT/ML IV SOLN
500.0000 [IU] | Freq: Once | INTRAVENOUS | Status: DC | PRN
  Filled 2020-09-05: qty 5

## 2020-09-05 MED ORDER — PALONOSETRON HCL INJECTION 0.25 MG/5ML
0.2500 mg | Freq: Once | INTRAVENOUS | Status: AC
  Administered 2020-09-05: 0.25 mg via INTRAVENOUS

## 2020-09-05 MED ORDER — SODIUM CHLORIDE 0.9 % IV SOLN
Freq: Once | INTRAVENOUS | Status: AC
  Filled 2020-09-05: qty 250

## 2020-09-05 MED ORDER — SODIUM CHLORIDE 0.9 % IV SOLN
180.0000 mg/m2 | Freq: Once | INTRAVENOUS | Status: AC
  Administered 2020-09-05: 320 mg via INTRAVENOUS
  Filled 2020-09-05: qty 15

## 2020-09-05 MED ORDER — IOHEXOL 350 MG/ML SOLN
80.0000 mL | Freq: Once | INTRAVENOUS | Status: AC | PRN
  Administered 2020-09-05: 80 mL via INTRAVENOUS

## 2020-09-05 MED ORDER — SODIUM CHLORIDE 0.9% FLUSH
10.0000 mL | Freq: Once | INTRAVENOUS | Status: AC
Start: 2020-09-05 — End: 2020-09-05
  Administered 2020-09-05: 10 mL
  Filled 2020-09-05: qty 10

## 2020-09-05 MED ORDER — SODIUM CHLORIDE 0.9 % IV SOLN
10.0000 mg | Freq: Once | INTRAVENOUS | Status: AC
  Administered 2020-09-05: 10 mg via INTRAVENOUS
  Filled 2020-09-05: qty 10

## 2020-09-05 MED ORDER — ATROPINE SULFATE 1 MG/ML IJ SOLN
INTRAMUSCULAR | Status: AC
  Filled 2020-09-05: qty 1

## 2020-09-05 MED ORDER — ATROPINE SULFATE 1 MG/ML IJ SOLN
0.5000 mg | Freq: Once | INTRAMUSCULAR | Status: AC
  Administered 2020-09-05: 0.5 mg via INTRAVENOUS

## 2020-09-05 MED ORDER — SODIUM CHLORIDE 0.9 % IV SOLN
300.0000 mg/m2 | Freq: Once | INTRAVENOUS | Status: AC
  Administered 2020-09-05: 522 mg via INTRAVENOUS
  Filled 2020-09-05: qty 26.1

## 2020-09-05 MED ORDER — SODIUM CHLORIDE 0.9 % IV SOLN
1600.0000 mg/m2 | INTRAVENOUS | Status: DC
  Administered 2020-09-05: 2800 mg via INTRAVENOUS
  Filled 2020-09-05: qty 56

## 2020-09-05 NOTE — Telephone Encounter (Signed)
Notified that CT showed no cancer, pneumonia or PE. MD just sent script in for her new cough medication.

## 2020-09-05 NOTE — Progress Notes (Signed)
Per Dr. Benay Spice: Hold Avastin today due to urine protein > 300.

## 2020-09-05 NOTE — Telephone Encounter (Signed)
-----   Message from Ladell Pier, MD sent at 09/05/2020  4:29 PM EST ----- Please call patient, the CT is negative for pulmonary embolism, pneumonia, and metastatic disease to the chest, follow-up as scheduled

## 2020-09-05 NOTE — Patient Instructions (Signed)
[facility] Discharge Instructions for Patients Receiving Chemotherapy  Today you received the following chemotherapy agents irinotecan, leucovorin, fluorourcil  To help prevent nausea and vomiting after your treatment, we encourage you to take your nausea medication as directed   If you develop nausea and vomiting that is not controlled by your nausea medication, call the clinic.   BELOW ARE SYMPTOMS THAT SHOULD BE REPORTED IMMEDIATELY:  *FEVER GREATER THAN 100.5 F  *CHILLS WITH OR WITHOUT FEVER  NAUSEA AND VOMITING THAT IS NOT CONTROLLED WITH YOUR NAUSEA MEDICATION  *UNUSUAL SHORTNESS OF BREATH  *UNUSUAL BRUISING OR BLEEDING  TENDERNESS IN MOUTH AND THROAT WITH OR WITHOUT PRESENCE OF ULCERS  *URINARY PROBLEMS  *BOWEL PROBLEMS  UNUSUAL RASH Items with * indicate a potential emergency and should be followed up as soon as possible.  Feel free to call the clinic should you have any questions or concerns. The clinic phone number is [phone].  Please show the [place] at check-in to the Emergency Department and triage nurse.

## 2020-09-05 NOTE — Progress Notes (Signed)
[facility] OFFICE PROGRESS NOTE   Diagnosis: Colon cancer  INTERVAL HISTORY:   [name] completed another cycle FOLFIRI/Avastin 08/03/2020.  No nausea or diarrhea following chemotherapy.  She reports mouth soreness.  She developed COVID-19 infection 08/13/2020.  She received monoclonal antibody therapy.  [name] reports a cough for the past several weeks.  She developed dyspnea the past 2 days.  No fever.  She has been taking an over-the-counter cough medication since running out of Hydromet.  She reports exertional dyspnea for the past several days.  Discomfort in the mid anterior chest.  The cough is partially improved.  No fever. She reports occasional mild nosebleeding and bleeding when she wipes following a bowel movement.  Objective:  Vital signs in last 24 hours:  Blood pressure (!) 148/85, pulse 98, temperature 97.9 F (36.6 C), temperature source Tympanic, resp. rate 18, height 5\' 2"  (1.575 m), weight 160 lb 3.2 oz (72.7 kg), SpO2 99 %.    HEENT: No thrush or ulcers Resp: Lungs clear bilaterally Cardio: Regular rate and rhythm GI: The liver is palpable in the upper abdomen Vascular: No leg edema or tenderness    Portacath/PICC-without erythema  Lab Results:  Lab Results  Component Value Date   WBC 7.1 09/05/2020   HGB 10.4 (L) 09/05/2020   HCT 32.4 (L) 09/05/2020   MCV 82.7 09/05/2020   PLT 340 09/05/2020   NEUTROABS 4.5 09/05/2020    CMP  Lab Results  Component Value Date   NA 142 08/03/2020   K 3.9 08/03/2020   CL 107 08/03/2020   CO2 25 08/03/2020   GLUCOSE 90 08/03/2020   BUN 13 08/03/2020   CREATININE 0.62 08/03/2020   CALCIUM 9.0 08/03/2020   PROT 6.8 08/03/2020   ALBUMIN 3.0 (L) 08/03/2020   AST 45 (H) 08/03/2020   ALT 41 08/03/2020   ALKPHOS 445 (H) 08/03/2020   BILITOT 0.3 08/03/2020   GFRNONAA >60 08/03/2020   GFRAA >60 03/30/2020    Lab Results  Component Value Date   CEA1 04/01/2020 (H) 08/03/2020     Medications: I have reviewed the patient's current medications.   Assessment/Plan: 1.Stage IV adenocarcinoma of the cecum. She presented with a large cecal mass, extensive hepatic metastatic disease; mild thoracic and abdominal adenopathy. She also has several small nonspecific pulmonary nodules. She was diagnosed in March 2021.   Colonoscopy 09/16/2019-fungating mass at the cecum, sessile polyps in the this sigmoid, transverse colon, and hepatic flexure, adenocarcinoma involving cecum biopsy, tubular adenoma and sessile serrated polyps  CTs chest, abdomen, and pelvis 10/08/2019-extensive liver metastases, thoracic/abdominal adenopathy, cecal mass, dilated and thin thickened appendix, nonspecific pulmonary nodules  Ultrasound-guided liver biopsy 10/13/2019-adenocarcinoma consistent with a metastatic colorectal primary, MSS, tumor mutation burden 3, KRAS Q61,  Cycle 1 FOLFOX 10/14/2019  Cycle 2 FOLFOX 10/27/2019  Cycle 3 FOLFOX 11/11/2019, Emend and Decadron added, 5-FU dose escalated  Cycle 4 FOLFOX 11/25/2019, Udenyca added  Cycle 5 FOLFOX 12/08/2019  Cycle 6 FOLFOX 12/23/2019  CT 01/04/2020-decreased size of multiple hepatic metastases, slight decrease in bulk of cecal mass, stable ileocecal adenopathy  Cycle 7 FOLFOX 01/06/2020  Cycle 8 FOLFOX 01/31/2020  Cycle 9 FOLFOX 02/17/2020 (oxaliplatin dose reduced due to borderline ANC, persistent cold sensitivity)  Cycle 10 FOLFOX 03/02/2020  Cycle 11 FOLFOX 03/16/2020  Cycle 12 FOLFOX 03/30/2020 (oxaliplatin held secondary to neuropathy)  CT abdomen/pelvis 04/19/2020-no significant change in the abnormal soft tissue lesion in the region of the cecal tip involving the ileocecal valve and appendiceal  orifice; slight interval progression of numerous hepatic metastases; similar appearance of small lymph nodes in the ileocolic mesentery with adjacent irregular focus of soft tissue also stable  Cycle 1 FOLFIRI/Avastin 04/25/2020  Cycle 2  FOLFIRI/Avastin 05/11/2020  Cycle 3 FOLFIRI 05/25/2020 (Avastin held due to proteinuria)  Cycle 4 FOLFIRI/Avastin 06/07/2020  Cycle 5 FOLFIRI/Avastin 06/22/2020  CTs 07/05/2020-mild improvement in multifocal liver metastases.  Mass at the base of the cecum is stable.  Persistent thickening of the appendix.  Cycle 6 FOLFIRI/Avastin 07/05/2020  Cycle 7 FOLFIRI/Avastin 07/20/2020  Cycle 8 FOLFIRI/Avastin 08/03/2020  Cycle 9 FOLFIRI/Avastin 09/05/2020 2) Iron deficiency anemia secondary to #1. 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 Diarrhea 01/20/2020. C. difficile negative 7.  COVID-19 infection 08/13/2020, antibody infusion therapy to 522      Disposition: [name] has metastatic colon cancer.  She is currently being treated with FOLFIRI/Avastin.  Treatment has been delayed secondary to a COVID-19 infection.  She will complete another cycle of FOLFIRI/Avastin today.  The urine protein is elevated today.  We will check a urinalysis.  If there is no evidence of a urinary tract infection Avastin will be placed on hold and she will be referred for a 24-hour urine protein.  [name] reports a cough for the past several weeks and dyspnea for a few days.  Her lung exam is unremarkable.  She is at risk for pulmonary embolism with the recent COVID-19 infection, cancer, and FOLFIRI/Avastin.  She will be referred for a chest CT today.  [name] will be scheduled for an office visit in the next cycle of chemotherapy on 09/21/2020.  Betsy Coder, MD  09/05/2020  8:35 AM

## 2020-09-06 ENCOUNTER — Telehealth: Payer: Self-pay | Admitting: Oncology

## 2020-09-06 NOTE — Telephone Encounter (Signed)
Scheduled appointments per 2/22 los. Spoke to patient who is aware of appointments dates and times.  

## 2020-09-07 ENCOUNTER — Inpatient Hospital Stay: Payer: 59

## 2020-09-07 ENCOUNTER — Other Ambulatory Visit: Payer: Self-pay | Admitting: *Deleted

## 2020-09-07 ENCOUNTER — Other Ambulatory Visit: Payer: Self-pay

## 2020-09-07 VITALS — BP 157/88 | HR 69 | Temp 98.1°F | Resp 18

## 2020-09-07 DIAGNOSIS — C19 Malignant neoplasm of rectosigmoid junction: Secondary | ICD-10-CM

## 2020-09-07 DIAGNOSIS — Z5112 Encounter for antineoplastic immunotherapy: Secondary | ICD-10-CM | POA: Diagnosis not present

## 2020-09-07 DIAGNOSIS — D509 Iron deficiency anemia, unspecified: Secondary | ICD-10-CM

## 2020-09-07 MED ORDER — PEGFILGRASTIM-CBQV 6 MG/0.6ML ~~LOC~~ SOSY
6.0000 mg | PREFILLED_SYRINGE | Freq: Once | SUBCUTANEOUS | Status: AC
Start: 2020-09-07 — End: 2020-09-07
  Administered 2020-09-07: 6 mg via SUBCUTANEOUS

## 2020-09-07 MED ORDER — PEGFILGRASTIM-CBQV 6 MG/0.6ML ~~LOC~~ SOSY
PREFILLED_SYRINGE | SUBCUTANEOUS | Status: AC
  Filled 2020-09-07: qty 0.6

## 2020-09-07 MED ORDER — SODIUM CHLORIDE 0.9% FLUSH
10.0000 mL | Freq: Once | INTRAVENOUS | Status: AC
Start: 2020-09-07 — End: 2020-09-07
  Administered 2020-09-07: 10 mL
  Filled 2020-09-07: qty 10

## 2020-09-07 MED ORDER — HEPARIN SOD (PORK) LOCK FLUSH 100 UNIT/ML IV SOLN
500.0000 [IU] | Freq: Once | INTRAVENOUS | Status: AC
  Administered 2020-09-07: 500 [IU]
  Filled 2020-09-07: qty 5

## 2020-09-07 NOTE — Progress Notes (Signed)
Called patient to pick up a 24 hour urine collection container from the lab today when she is here for pump d/c. She agrees.

## 2020-09-07 NOTE — Patient Instructions (Signed)
Implanted Port Insertion, Care After This sheet gives you information about how to care for yourself after your procedure. Your health care provider may also give you more specific instructions. If you have problems or questions, contact your health care provider. What can I expect after the procedure? After the procedure, it is common to have:  Discomfort at the port insertion site.  Bruising on the skin over the port. This should improve over 3-4 days. Follow these instructions at home: [facility] care  After your port is placed, you will get a manufacturer's information card. The card has information about your port. Keep this card with you at all times.  Take care of the port as told by your health care provider. Ask your health care provider if you or a family member can get training for taking care of the port at home. A home health care nurse may also take care of the port.  Make sure to remember what type of port you have. Incision care  Follow instructions from your health care provider about how to take care of your port insertion site. Make sure you: ? Wash your hands with soap and water before and after you change your bandage (dressing). If soap and water are not available, use hand sanitizer. ? Change your dressing as told by your health care provider. ? Leave stitches (sutures), skin glue, or adhesive strips in place. These skin closures may need to stay in place for 2 weeks or longer. If adhesive strip edges start to loosen and curl up, you may trim the loose edges. Do not remove adhesive strips completely unless your health care provider tells you to do that.  Check your port insertion site every day for signs of infection. Check for: ? Redness, swelling, or pain. ? Fluid or blood. ? Warmth. ? Pus or a bad smell.      Activity  Return to your normal activities as told by your health care provider. Ask your health care provider what activities are safe for you.  Do not  lift anything that is heavier than 10 lb (4.5 kg), or the limit that you are told, until your health care provider says that it is safe. General instructions  Take over-the-counter and prescription medicines only as told by your health care provider.  Do not take baths, swim, or use a hot tub until your health care provider approves. Ask your health care provider if you may take showers. You may only be allowed to take sponge baths.  Do not drive for 24 hours if you were given a sedative during your procedure.  Wear a medical alert bracelet in case of an emergency. This will tell any health care providers that you have a port.  Keep all follow-up visits as told by your health care provider. This is important. Contact a health care provider if:  You cannot flush your port with saline as directed, or you cannot draw blood from the port.  You have a fever or chills. Pegfilgrastim injection What is this medicine? PEGFILGRASTIM (PEG fil gra stim) is a long-acting granulocyte colony-stimulating factor that stimulates the growth of neutrophils, a type of white blood cell important in the body's fight against infection. It is used to reduce the incidence of fever and infection in patients with certain types of cancer who are receiving chemotherapy that affects the bone marrow, and to increase survival after being exposed to high doses of radiation. This medicine may be used for other purposes; ask  your health care provider or pharmacist if you have questions. COMMON BRAND NAME(S): Rexene Edison, Ziextenzo What should I tell my health care provider before I take this medicine? They need to know if you have any of these conditions: kidney disease latex allergy ongoing radiation therapy sickle cell disease skin reactions to acrylic adhesives (On-Body Injector only) an unusual or allergic reaction to pegfilgrastim, filgrastim, other medicines, foods, dyes, or  preservatives pregnant or trying to get pregnant breast-feeding How should I use this medicine? This medicine is for injection under the skin. If you get this medicine at home, you will be taught how to prepare and give the pre-filled syringe or how to use the On-body Injector. Refer to the patient Instructions for Use for detailed instructions. Use exactly as directed. Tell your healthcare provider immediately if you suspect that the On-body Injector may not have performed as intended or if you suspect the use of the On-body Injector resulted in a missed or partial dose. It is important that you put your used needles and syringes in a special sharps container. Do not put them in a trash can. If you do not have a sharps container, call your pharmacist or healthcare provider to get one. Talk to your pediatrician regarding the use of this medicine in children. While this drug may be prescribed for selected conditions, precautions do apply. Overdosage: If you think you have taken too much of this medicine contact a poison control center or emergency room at once. NOTE: This medicine is only for you. Do not share this medicine with others. What if I miss a dose? It is important not to miss your dose. Call your doctor or health care professional if you miss your dose. If you miss a dose due to an On-body Injector failure or leakage, a new dose should be administered as soon as possible using a single prefilled syringe for manual use. What may interact with this medicine? Interactions have not been studied. This list may not describe all possible interactions. Give your health care provider a list of all the medicines, herbs, non-prescription drugs, or dietary supplements you use. Also tell them if you smoke, drink alcohol, or use illegal drugs. Some items may interact with your medicine. What should I watch for while using this medicine? Your condition will be monitored carefully while you are receiving  this medicine. You may need blood work done while you are taking this medicine. Talk to your health care provider about your risk of cancer. You may be more at risk for certain types of cancer if you take this medicine. If you are going to need a MRI, CT scan, or other procedure, tell your doctor that you are using this medicine (On-Body Injector only). What side effects may I notice from receiving this medicine? Side effects that you should report to your doctor or health care professional as soon as possible: allergic reactions (skin rash, itching or hives, swelling of the face, lips, or tongue) back pain dizziness fever pain, redness, or irritation at site where injected pinpoint red spots on the skin red or dark-brown urine shortness of breath or breathing problems stomach or side pain, or pain at the shoulder swelling tiredness trouble passing urine or change in the amount of urine unusual bruising or bleeding Side effects that usually do not require medical attention (report to your doctor or health care professional if they continue or are bothersome): bone pain muscle pain This list may not describe all possible  side effects. Call your doctor for medical advice about side effects. You may report side effects to FDA at 1-800-FDA-1088. Where should I keep my medicine? Keep out of the reach of children. If you are using this medicine at home, you will be instructed on how to store it. Throw away any unused medicine after the expiration date on the label. NOTE: This sheet is a summary. It may not cover all possible information. If you have questions about this medicine, talk to your doctor, pharmacist, or health care provider.  2021 Elsevier/Gold Standard (2019-07-23 13:20:51)  ou have redness, swelling, or pain around your port insertion site.  You have fluid or blood coming from your port insertion site.  Your port insertion site feels warm to the touch.  You have pus or a  bad smell coming from the port insertion site. Get help right away if:  You have chest pain or shortness of breath.  You have bleeding from your port that you cannot control. Summary  Take care of the port as told by your health care provider. Keep the manufacturer's information card with you at all times.  Change your dressing as told by your health care provider.  Contact a health care provider if you have a fever or chills or if you have redness, swelling, or pain around your port insertion site.  Keep all follow-up visits as told by your health care provider. This information is not intended to replace advice given to you by your health care provider. Make sure you discuss any questions you have with your health care provider. Document Revised: 01/27/2018 Document Reviewed: 01/27/2018 Elsevier Patient Education  [place].

## 2020-09-08 DIAGNOSIS — Z5112 Encounter for antineoplastic immunotherapy: Secondary | ICD-10-CM | POA: Diagnosis not present

## 2020-09-08 LAB — PROTEIN, URINE, 24 HOUR
Collection Interval-UPROT: 24 hours
Protein, 24H Urine: 1717 mg/d — ABNORMAL HIGH (ref 50–100)
Protein, Urine: 101 mg/dL
Urine Total Volume-UPROT: 1700 mL

## 2020-09-14 ENCOUNTER — Other Ambulatory Visit: Payer: 59

## 2020-09-14 ENCOUNTER — Ambulatory Visit: Payer: 59

## 2020-09-14 ENCOUNTER — Ambulatory Visit: Payer: 59 | Admitting: Oncology

## 2020-09-17 ENCOUNTER — Other Ambulatory Visit: Payer: Self-pay | Admitting: Oncology

## 2020-09-20 ENCOUNTER — Inpatient Hospital Stay: Payer: 59 | Attending: Physician Assistant

## 2020-09-20 ENCOUNTER — Other Ambulatory Visit: Payer: Self-pay

## 2020-09-20 ENCOUNTER — Inpatient Hospital Stay (HOSPITAL_BASED_OUTPATIENT_CLINIC_OR_DEPARTMENT_OTHER): Payer: 59 | Admitting: Oncology

## 2020-09-20 ENCOUNTER — Inpatient Hospital Stay: Payer: 59

## 2020-09-20 VITALS — BP 149/92 | HR 74 | Temp 97.6°F | Resp 18 | Ht 62.0 in | Wt 159.7 lb

## 2020-09-20 DIAGNOSIS — Z5189 Encounter for other specified aftercare: Secondary | ICD-10-CM | POA: Diagnosis not present

## 2020-09-20 DIAGNOSIS — Z5112 Encounter for antineoplastic immunotherapy: Secondary | ICD-10-CM | POA: Insufficient documentation

## 2020-09-20 DIAGNOSIS — D509 Iron deficiency anemia, unspecified: Secondary | ICD-10-CM | POA: Insufficient documentation

## 2020-09-20 DIAGNOSIS — C180 Malignant neoplasm of cecum: Secondary | ICD-10-CM | POA: Insufficient documentation

## 2020-09-20 DIAGNOSIS — T451X5A Adverse effect of antineoplastic and immunosuppressive drugs, initial encounter: Secondary | ICD-10-CM | POA: Insufficient documentation

## 2020-09-20 DIAGNOSIS — G620 Drug-induced polyneuropathy: Secondary | ICD-10-CM | POA: Insufficient documentation

## 2020-09-20 DIAGNOSIS — C19 Malignant neoplasm of rectosigmoid junction: Secondary | ICD-10-CM

## 2020-09-20 DIAGNOSIS — R197 Diarrhea, unspecified: Secondary | ICD-10-CM | POA: Insufficient documentation

## 2020-09-20 DIAGNOSIS — Z5111 Encounter for antineoplastic chemotherapy: Secondary | ICD-10-CM | POA: Insufficient documentation

## 2020-09-20 DIAGNOSIS — R110 Nausea: Secondary | ICD-10-CM | POA: Insufficient documentation

## 2020-09-20 DIAGNOSIS — R809 Proteinuria, unspecified: Secondary | ICD-10-CM | POA: Diagnosis not present

## 2020-09-20 DIAGNOSIS — G893 Neoplasm related pain (acute) (chronic): Secondary | ICD-10-CM | POA: Diagnosis not present

## 2020-09-20 LAB — CBC WITH DIFFERENTIAL (CANCER CENTER ONLY)
Abs Immature Granulocytes: 0.03 10*3/uL (ref 0.00–0.07)
Basophils Absolute: 0 10*3/uL (ref 0.0–0.1)
Basophils Relative: 1 %
Eosinophils Absolute: 0.1 10*3/uL (ref 0.0–0.5)
Eosinophils Relative: 1 %
HCT: 34.5 % — ABNORMAL LOW (ref 36.0–46.0)
Hemoglobin: 10.7 g/dL — ABNORMAL LOW (ref 12.0–15.0)
Immature Granulocytes: 1 %
Lymphocytes Relative: 27 %
Lymphs Abs: 1.6 10*3/uL (ref 0.7–4.0)
MCH: 25.8 pg — ABNORMAL LOW (ref 26.0–34.0)
MCHC: 31 g/dL (ref 30.0–36.0)
MCV: 83.3 fL (ref 80.0–100.0)
Monocytes Absolute: 0.6 10*3/uL (ref 0.1–1.0)
Monocytes Relative: 10 %
Neutro Abs: 3.6 10*3/uL (ref 1.7–7.7)
Neutrophils Relative %: 60 %
Platelet Count: 245 10*3/uL (ref 150–400)
RBC: 4.14 MIL/uL (ref 3.87–5.11)
RDW: 16.4 % — ABNORMAL HIGH (ref 11.5–15.5)
WBC Count: 5.8 10*3/uL (ref 4.0–10.5)
nRBC: 0 % (ref 0.0–0.2)

## 2020-09-20 LAB — CMP (CANCER CENTER ONLY)
ALT: 57 U/L — ABNORMAL HIGH (ref 0–44)
AST: 54 U/L — ABNORMAL HIGH (ref 15–41)
Albumin: 2.9 g/dL — ABNORMAL LOW (ref 3.5–5.0)
Alkaline Phosphatase: 714 U/L — ABNORMAL HIGH (ref 38–126)
Anion gap: 11 (ref 5–15)
BUN: 10 mg/dL (ref 8–23)
CO2: 27 mmol/L (ref 22–32)
Calcium: 9.4 mg/dL (ref 8.9–10.3)
Chloride: 103 mmol/L (ref 98–111)
Creatinine: 0.6 mg/dL (ref 0.44–1.00)
GFR, Estimated: >60 mL/min (ref >60)
Glucose, Bld: 89 mg/dL (ref 70–99)
Potassium: 3.8 mmol/L (ref 3.5–5.1)
Sodium: 141 mmol/L (ref 135–145)
Total Bilirubin: 0.5 mg/dL (ref 0.3–1.2)
Total Protein: 8 g/dL (ref 6.5–8.1)

## 2020-09-20 LAB — TOTAL PROTEIN, URINE DIPSTICK: Protein, ur: 300 mg/dL — AB

## 2020-09-20 LAB — CEA (IN HOUSE-CHCC): CEA (CHCC-In House): 4829.73 ng/mL — ABNORMAL HIGH (ref 0.00–5.00)

## 2020-09-20 MED ORDER — SODIUM CHLORIDE 0.9 % IV SOLN
180.0000 mg/m2 | Freq: Once | INTRAVENOUS | Status: AC
  Administered 2020-09-20: 320 mg via INTRAVENOUS
  Filled 2020-09-20: qty 15

## 2020-09-20 MED ORDER — SODIUM CHLORIDE 0.9 % IV SOLN
Freq: Once | INTRAVENOUS | Status: AC
  Filled 2020-09-20: qty 250

## 2020-09-20 MED ORDER — PALONOSETRON HCL INJECTION 0.25 MG/5ML
INTRAVENOUS | Status: AC
  Filled 2020-09-20: qty 5

## 2020-09-20 MED ORDER — SODIUM CHLORIDE 0.9 % IV SOLN
10.0000 mg | Freq: Once | INTRAVENOUS | Status: AC
  Administered 2020-09-20: 10 mg via INTRAVENOUS
  Filled 2020-09-20: qty 10

## 2020-09-20 MED ORDER — PALONOSETRON HCL INJECTION 0.25 MG/5ML
0.2500 mg | Freq: Once | INTRAVENOUS | Status: AC
  Administered 2020-09-20: 0.25 mg via INTRAVENOUS

## 2020-09-20 MED ORDER — SODIUM CHLORIDE 0.9% FLUSH
10.0000 mL | INTRAVENOUS | Status: DC | PRN
  Filled 2020-09-20: qty 10

## 2020-09-20 MED ORDER — HEPARIN SOD (PORK) LOCK FLUSH 100 UNIT/ML IV SOLN
500.0000 [IU] | Freq: Once | INTRAVENOUS | Status: DC | PRN
  Filled 2020-09-20: qty 5

## 2020-09-20 MED ORDER — SODIUM CHLORIDE 0.9 % IV SOLN
1600.0000 mg/m2 | INTRAVENOUS | Status: DC
  Administered 2020-09-20: 2800 mg via INTRAVENOUS
  Filled 2020-09-20: qty 56

## 2020-09-20 MED ORDER — SODIUM CHLORIDE 0.9 % IV SOLN
300.0000 mg/m2 | Freq: Once | INTRAVENOUS | Status: AC
  Administered 2020-09-20: 522 mg via INTRAVENOUS
  Filled 2020-09-20: qty 26.1

## 2020-09-20 MED ORDER — SODIUM CHLORIDE 0.9% FLUSH
10.0000 mL | Freq: Once | INTRAVENOUS | Status: AC
Start: 2020-09-20 — End: 2020-09-20
  Administered 2020-09-20: 10 mL
  Filled 2020-09-20: qty 10

## 2020-09-20 MED ORDER — ATROPINE SULFATE 1 MG/ML IJ SOLN
INTRAMUSCULAR | Status: AC
  Filled 2020-09-20: qty 1

## 2020-09-20 MED ORDER — ATROPINE SULFATE 1 MG/ML IJ SOLN
0.5000 mg | Freq: Once | INTRAMUSCULAR | Status: AC | PRN
  Administered 2020-09-20: 0.5 mg via INTRAVENOUS

## 2020-09-20 NOTE — Progress Notes (Signed)
[facility] OFFICE PROGRESS NOTE   Diagnosis: Colon cancer  INTERVAL HISTORY:   Ms. Gainey completed another cycle of FOLFIRI on 09/05/2020.  She had mild nausea following chemotherapy.  Avastin was held secondary to proteinuria.  She has mild abdominal discomfort.  Objective:  Vital signs in last 24 hours:  Blood pressure (!) 149/92, pulse 74, temperature 97.6 F (36.4 C), temperature source Tympanic, resp. rate 18, height _0  (1.575 m), weight 159 lb 11.2 oz (72.4 kg), SpO2 100 %.    HEENT: No thrush or ulcers Resp: Lungs clear bilaterally Cardio: Regular rate and rhythm GI: Liver is palpable in the right abdomen Vascular: No leg edema   Portacath/PICC-without erythema  Lab Results:  Lab Results  Component Value Date   WBC 5.8 09/20/2020   HGB 10.7 (L) 09/20/2020   HCT 34.5 (L) 09/20/2020   MCV 83.3 09/20/2020   PLT 245 09/20/2020   NEUTROABS 3.6 09/20/2020    CMP  Lab Results  Component Value Date   NA 136 09/05/2020   K 4.1 09/05/2020   CL 103 09/05/2020   CO2 25 09/05/2020   GLUCOSE 96 09/05/2020   BUN 9 09/05/2020   CREATININE 0.64 09/05/2020   CALCIUM 9.4 09/05/2020   PROT 8.9 (H) 09/05/2020   ALBUMIN 2.9 (L) 09/05/2020   AST 65 (H) 09/05/2020   ALT 50 (H) 09/05/2020   ALKPHOS 614 (H) 09/05/2020   BILITOT 0.8 09/05/2020   GFRNONAA >60 09/05/2020   GFRAA >60 03/30/2020    Lab Results  Component Value Date   CEA1 3,470.62 (H) 09/05/2020     Medications: I have reviewed the patient's current medications.   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 She presented with a large cecal mass, extensive hepatic metastatic disease; mild thoracic and abdominal adenopathy. She also has several small nonspecific pulmonary nodules. She was diagnosed in March 2021.   Colonoscopy 09/16/2019-fungating mass at the cecum, sessile polyps in the this sigmoid, transverse colon, and hepatic flexure, adenocarcinoma involving cecum  biopsy, tubular adenoma and sessile serrated polyps  CTs chest, abdomen, and pelvis 10/08/2019-extensive liver metastases, thoracic/abdominal adenopathy, cecal mass, dilated and thin thickened appendix, nonspecific pulmonary nodules  Ultrasound-guided liver biopsy 10/13/2019-adenocarcinoma consistent with a metastatic colorectal primary, MSS, tumor mutation burden 3, KRAS Q61,  Cycle 1 FOLFOX 10/14/2019  Cycle 2 FOLFOX 10/27/2019  Cycle 3 FOLFOX 11/11/2019, Emend and Decadron added, 5-FU dose escalated  Cycle 4 FOLFOX 11/25/2019, Udenyca added  Cycle 5 FOLFOX 12/08/2019  Cycle 6 FOLFOX 12/23/2019  CT 01/04/2020-decreased size of multiple hepatic metastases, slight decrease in bulk of cecal mass, stable ileocecal adenopathy  Cycle 7 FOLFOX 01/06/2020  Cycle 8 FOLFOX 01/31/2020  Cycle 9 FOLFOX 02/17/2020 (oxaliplatin dose reduced due to borderline ANC, persistent cold sensitivity)  Cycle 10 FOLFOX 03/02/2020  Cycle 11 FOLFOX 03/16/2020  Cycle 12 FOLFOX 03/30/2020 (oxaliplatin held secondary to neuropathy)  CT abdomen/pelvis 04/19/2020-no significant change in the abnormal soft tissue lesion in the region of the cecal tip involving the ileocecal valve and appendiceal orifice; slight interval progression of numerous hepatic metastases; similar appearance of small lymph nodes in the ileocolic mesentery with adjacent irregular focus of soft tissue also stable  Cycle 1 FOLFIRI/Avastin 04/25/2020  Cycle 2 FOLFIRI/Avastin 05/11/2020  Cycle 3 FOLFIRI 05/25/2020 (Avastin held due to proteinuria)  Cycle 4 FOLFIRI/Avastin 06/07/2020  Cycle 5 FOLFIRI/Avastin 06/22/2020  CTs 07/05/2020-mild improvement in multifocal liver metastases.  Mass at the base of the cecum is stable.  Persistent thickening  of the appendix.  Cycle 6 FOLFIRI/Avastin 07/05/2020  Cycle 7 FOLFIRI/Avastin 07/20/2020  Cycle 8 FOLFIRI/Avastin 08/03/2020  Cycle 9 FOLFIRI 09/05/2020, Avastin held secondary to proteinuria  CT chest  09/05/2020-negative for pulmonary embolism, no acute disease, unchanged small pulmonary nodules  Cycle 10 FOLFIRI 09/20/2020, Avastin held secondary to proteinuria 2) Iron deficiency anemia secondary to #1. 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 Diarrhea 01/20/2020. C. difficile negative 7.  COVID-19 infection 08/13/2020, antibody infusion therapy on 08/19/2020 8.  Proteinuria secondary to bevacizumab-confirmed on 24-hour urine 09/08/2020    Disposition: Ms. Bansal appears stable.  She will complete another cycle of FOLFIRI today.  The 24-hour urine confirm significant proteinuria.  Bevacizumab will remain on hold.  She will return a repeat 24-hour urine on 09/29/2020.  Ms. Templin will return for an office visit and chemotherapy in 2 weeks.  She will be scheduled for a restaging CT evaluation after the next cycle of chemotherapy.  Betsy Coder, MD  09/20/2020  10:18 AM

## 2020-09-20 NOTE — Patient Instructions (Signed)
[facility] Discharge Instructions for Patients Receiving Chemotherapy  Today you received the following chemotherapy agents: Irinotecan, Leucovorin, 5FU  To help prevent nausea and vomiting after your treatment, we encourage you to take your nausea medication as directed.   If you develop nausea and vomiting that is not controlled by your nausea medication, call the clinic.   BELOW ARE SYMPTOMS THAT SHOULD BE REPORTED IMMEDIATELY:  *FEVER GREATER THAN 100.5 F  *CHILLS WITH OR WITHOUT FEVER  NAUSEA AND VOMITING THAT IS NOT CONTROLLED WITH YOUR NAUSEA MEDICATION  *UNUSUAL SHORTNESS OF BREATH  *UNUSUAL BRUISING OR BLEEDING  TENDERNESS IN MOUTH AND THROAT WITH OR WITHOUT PRESENCE OF ULCERS  *URINARY PROBLEMS  *BOWEL PROBLEMS  UNUSUAL RASH Items with * indicate a potential emergency and should be followed up as soon as possible.  Feel free to call the clinic should you have any questions or concerns. The clinic phone number is [phone].  Please show the [facility] at check-in to the Emergency Department and triage nurse.

## 2020-09-20 NOTE — Patient Instructions (Signed)

## 2020-09-21 ENCOUNTER — Telehealth: Payer: Self-pay | Admitting: Oncology

## 2020-09-21 NOTE — Telephone Encounter (Signed)
Scheduled appointments per 3/9 los. Spoke to patient who is aware of appointments date and times.  

## 2020-09-22 ENCOUNTER — Inpatient Hospital Stay: Payer: 59

## 2020-09-22 ENCOUNTER — Other Ambulatory Visit: Payer: Self-pay

## 2020-09-22 VITALS — BP 165/86 | HR 68 | Temp 98.5°F | Resp 18

## 2020-09-22 DIAGNOSIS — Z5112 Encounter for antineoplastic immunotherapy: Secondary | ICD-10-CM | POA: Diagnosis not present

## 2020-09-22 DIAGNOSIS — C19 Malignant neoplasm of rectosigmoid junction: Secondary | ICD-10-CM

## 2020-09-22 MED ORDER — PEGFILGRASTIM-CBQV 6 MG/0.6ML ~~LOC~~ SOSY
6.0000 mg | PREFILLED_SYRINGE | Freq: Once | SUBCUTANEOUS | Status: AC
Start: 2020-09-22 — End: 2020-09-22
  Administered 2020-09-22: 6 mg via SUBCUTANEOUS

## 2020-09-22 MED ORDER — PEGFILGRASTIM-CBQV 6 MG/0.6ML ~~LOC~~ SOSY
PREFILLED_SYRINGE | SUBCUTANEOUS | Status: AC
  Filled 2020-09-22: qty 0.6

## 2020-09-22 MED ORDER — SODIUM CHLORIDE 0.9% FLUSH
10.0000 mL | INTRAVENOUS | Status: DC | PRN
  Administered 2020-09-22: 10 mL
  Filled 2020-09-22: qty 10

## 2020-09-22 MED ORDER — HEPARIN SOD (PORK) LOCK FLUSH 100 UNIT/ML IV SOLN
500.0000 [IU] | Freq: Once | INTRAVENOUS | Status: AC | PRN
  Administered 2020-09-22: 500 [IU]
  Filled 2020-09-22: qty 5

## 2020-09-22 NOTE — Patient Instructions (Signed)
Pegfilgrastim injection What is this medicine? PEGFILGRASTIM (PEG fil gra stim) is a long-acting granulocyte colony-stimulating factor that stimulates the growth of neutrophils, a type of white blood cell important in the body's fight against infection. It is used to reduce the incidence of fever and infection in patients with certain types of cancer who are receiving chemotherapy that affects the bone marrow, and to increase survival after being exposed to high doses of radiation. This medicine may be used for other purposes; ask your health care provider or pharmacist if you have questions. COMMON BRAND NAME(S): Fulphila, Neulasta, Nyvepria, UDENYCA, Ziextenzo What should I tell my health care provider before I take this medicine? They need to know if you have any of these conditions:  kidney disease  latex allergy  ongoing radiation therapy  sickle cell disease  skin reactions to acrylic adhesives (On-Body Injector only)  an unusual or allergic reaction to pegfilgrastim, filgrastim, other medicines, foods, dyes, or preservatives  pregnant or trying to get pregnant  breast-feeding How should I use this medicine? This medicine is for injection under the skin. If you get this medicine at home, you will be taught how to prepare and give the pre-filled syringe or how to use the On-body Injector. Refer to the patient Instructions for Use for detailed instructions. Use exactly as directed. Tell your healthcare provider immediately if you suspect that the On-body Injector may not have performed as intended or if you suspect the use of the On-body Injector resulted in a missed or partial dose. It is important that you put your used needles and syringes in a special sharps container. Do not put them in a trash can. If you do not have a sharps container, call your pharmacist or healthcare provider to get one. Talk to your pediatrician regarding the use of this medicine in children. While this drug  may be prescribed for selected conditions, precautions do apply. Overdosage: If you think you have taken too much of this medicine contact a poison control center or emergency room at once. NOTE: This medicine is only for you. Do not share this medicine with others. What if I miss a dose? It is important not to miss your dose. Call your doctor or health care professional if you miss your dose. If you miss a dose due to an On-body Injector failure or leakage, a new dose should be administered as soon as possible using a single prefilled syringe for manual use. What may interact with this medicine? Interactions have not been studied. This list may not describe all possible interactions. Give your health care provider a list of all the medicines, herbs, non-prescription drugs, or dietary supplements you use. Also tell them if you smoke, drink alcohol, or use illegal drugs. Some items may interact with your medicine. What should I watch for while using this medicine? Your condition will be monitored carefully while you are receiving this medicine. You may need blood work done while you are taking this medicine. Talk to your health care provider about your risk of cancer. You may be more at risk for certain types of cancer if you take this medicine. If you are going to need a MRI, CT scan, or other procedure, tell your doctor that you are using this medicine (On-Body Injector only). What side effects may I notice from receiving this medicine? Side effects that you should report to your doctor or health care professional as soon as possible:  allergic reactions (skin rash, itching or hives, swelling of   the face, lips, or tongue)  back pain  dizziness  fever  pain, redness, or irritation at site where injected  pinpoint red spots on the skin  red or dark-brown urine  shortness of breath or breathing problems  stomach or side pain, or pain at the shoulder  swelling  tiredness  trouble  passing urine or change in the amount of urine  unusual bruising or bleeding Side effects that usually do not require medical attention (report to your doctor or health care professional if they continue or are bothersome):  bone pain  muscle pain This list may not describe all possible side effects. Call your doctor for medical advice about side effects. You may report side effects to FDA at 1-800-FDA-1088. Where should I keep my medicine? Keep out of the reach of children. If you are using this medicine at home, you will be instructed on how to store it. Throw away any unused medicine after the expiration date on the label. NOTE: This sheet is a summary. It may not cover all possible information. If you have questions about this medicine, talk to your doctor, pharmacist, or health care provider.  2021 Elsevier/Gold Standard (2019-07-23 13:20:51)  

## 2020-09-24 ENCOUNTER — Other Ambulatory Visit: Payer: Self-pay | Admitting: Family Medicine

## 2020-09-27 NOTE — Telephone Encounter (Signed)
This patient was last seen in Feb 2021.   Appointment scheduled for 09/29/2020.   Can this patient receive refills?

## 2020-09-28 ENCOUNTER — Other Ambulatory Visit: Payer: Self-pay

## 2020-09-29 ENCOUNTER — Inpatient Hospital Stay: Payer: 59

## 2020-09-29 ENCOUNTER — Encounter: Payer: Self-pay | Admitting: Family Medicine

## 2020-09-29 ENCOUNTER — Ambulatory Visit (INDEPENDENT_AMBULATORY_CARE_PROVIDER_SITE_OTHER): Payer: 59 | Admitting: Family Medicine

## 2020-09-29 VITALS — BP 120/76 | HR 77 | Temp 97.8°F | Wt 158.6 lb

## 2020-09-29 DIAGNOSIS — D509 Iron deficiency anemia, unspecified: Secondary | ICD-10-CM

## 2020-09-29 DIAGNOSIS — D120 Benign neoplasm of cecum: Secondary | ICD-10-CM

## 2020-09-29 DIAGNOSIS — C19 Malignant neoplasm of rectosigmoid junction: Secondary | ICD-10-CM

## 2020-09-29 DIAGNOSIS — I10 Essential (primary) hypertension: Secondary | ICD-10-CM

## 2020-09-29 DIAGNOSIS — E782 Mixed hyperlipidemia: Secondary | ICD-10-CM | POA: Diagnosis not present

## 2020-09-29 DIAGNOSIS — Z5112 Encounter for antineoplastic immunotherapy: Secondary | ICD-10-CM | POA: Diagnosis not present

## 2020-09-29 DIAGNOSIS — L02224 Furuncle of groin: Secondary | ICD-10-CM

## 2020-09-29 LAB — LIPID PANEL
Cholesterol: 190 mg/dL (ref 0–200)
HDL: 35.7 mg/dL — ABNORMAL LOW (ref >39.00)
LDL Cholesterol: 134 mg/dL — ABNORMAL HIGH (ref 0–99)
NonHDL: 154.4
Total CHOL/HDL Ratio: 5
Triglycerides: 101 mg/dL (ref 0.0–149.0)
VLDL: 20.2 mg/dL (ref 0.0–40.0)

## 2020-09-29 LAB — TSH: TSH: 4.11 u[IU]/mL (ref 0.35–4.50)

## 2020-09-29 LAB — PROTEIN, URINE, 24 HOUR
Collection Interval-UPROT: 24 hours
Protein, 24H Urine: 720 mg/d — ABNORMAL HIGH (ref 50–100)
Protein, Urine: 90 mg/dL
Urine Total Volume-UPROT: 800 mL

## 2020-09-29 MED ORDER — HYDROCHLOROTHIAZIDE 25 MG PO TABS: 25.0000 mg | ORAL_TABLET | Freq: Every day | ORAL | 3 refills | Status: DC

## 2020-09-29 MED ORDER — POTASSIUM CHLORIDE ER 10 MEQ PO CPCR: 20.0000 meq | ORAL_CAPSULE | Freq: Two times a day (BID) | ORAL | 3 refills | Status: DC

## 2020-09-29 MED ORDER — ATENOLOL 50 MG PO TABS: ORAL_TABLET | ORAL | 3 refills | Status: DC

## 2020-09-29 MED ORDER — DOXYCYCLINE HYCLATE 100 MG PO CAPS: 100.0000 mg | ORAL_CAPSULE | Freq: Two times a day (BID) | ORAL | 0 refills | Status: AC

## 2020-09-29 MED ORDER — AMLODIPINE BESYLATE 5 MG PO TABS: 5.0000 mg | ORAL_TABLET | Freq: Every day | ORAL | 3 refills | Status: DC

## 2020-09-29 MED ORDER — SIMVASTATIN 40 MG PO TABS: 40.0000 mg | ORAL_TABLET | Freq: Every day | ORAL | 3 refills | Status: DC

## 2020-09-29 NOTE — Addendum Note (Signed)
Addended by: Janann Colonel on: 09/29/2020 11:03 AM   Modules accepted: Orders

## 2020-09-29 NOTE — Progress Notes (Signed)
   Subjective:    Patient ID: Rachel Frank, female    DOB: [birth year], 60 y.o.   MRN: [id number]  HPI Here to follow up on issues and to check a painful boil in the left groin that appeared one week ago. No fever. She feels well in general.    Review of Systems  Constitutional: Negative.   Respiratory: Negative.   Cardiovascular: Negative.   Gastrointestinal: Negative.   Genitourinary: Negative.        Objective:   Physical Exam Constitutional:      Appearance: Normal appearance.  Cardiovascular:     Rate and Rhythm: Normal rate and regular rhythm.     Pulses: Normal pulses.     Heart sounds: Normal heart sounds.  Pulmonary:     Effort: Pulmonary effort is normal.     Breath sounds: Normal breath sounds.  Abdominal:     General: Abdomen is flat. Bowel sounds are normal. There is no distension.     Palpations: Abdomen is soft. There is no mass.     Tenderness: There is no abdominal tenderness. There is no guarding or rebound.     Hernia: No hernia is present.  Lymphadenopathy:     Cervical: No cervical adenopathy.  Skin:    Comments: There is a tender boil in the left labia majora   Neurological:     Mental Status: She is alert.           Assessment & Plan:  She is seeing Oncology for the rectal cancer. Her HTN and anemia are stable. We will get fasting labs today to check lipids and TSH. Treat the boil with 10 days of Doxycycline.  Alysia Penna, MD

## 2020-10-01 ENCOUNTER — Other Ambulatory Visit: Payer: Self-pay | Admitting: Oncology

## 2020-10-04 ENCOUNTER — Inpatient Hospital Stay: Payer: 59

## 2020-10-04 ENCOUNTER — Telehealth: Payer: Self-pay | Admitting: Oncology

## 2020-10-04 ENCOUNTER — Other Ambulatory Visit: Payer: Self-pay

## 2020-10-04 ENCOUNTER — Inpatient Hospital Stay (HOSPITAL_BASED_OUTPATIENT_CLINIC_OR_DEPARTMENT_OTHER): Payer: 59 | Admitting: Oncology

## 2020-10-04 VITALS — BP 141/82 | HR 77 | Temp 97.8°F | Resp 20 | Ht 62.0 in | Wt 159.0 lb

## 2020-10-04 DIAGNOSIS — Z5112 Encounter for antineoplastic immunotherapy: Secondary | ICD-10-CM | POA: Diagnosis not present

## 2020-10-04 DIAGNOSIS — C19 Malignant neoplasm of rectosigmoid junction: Secondary | ICD-10-CM

## 2020-10-04 DIAGNOSIS — D509 Iron deficiency anemia, unspecified: Secondary | ICD-10-CM

## 2020-10-04 LAB — CBC WITH DIFFERENTIAL (CANCER CENTER ONLY)
Abs Immature Granulocytes: 0.03 10*3/uL (ref 0.00–0.07)
Basophils Absolute: 0 10*3/uL (ref 0.0–0.1)
Basophils Relative: 1 %
Eosinophils Absolute: 0 10*3/uL (ref 0.0–0.5)
Eosinophils Relative: 1 %
HCT: 34.6 % — ABNORMAL LOW (ref 36.0–46.0)
Hemoglobin: 10.9 g/dL — ABNORMAL LOW (ref 12.0–15.0)
Immature Granulocytes: 1 %
Lymphocytes Relative: 32 %
Lymphs Abs: 2.1 10*3/uL (ref 0.7–4.0)
MCH: 26 pg (ref 26.0–34.0)
MCHC: 31.5 g/dL (ref 30.0–36.0)
MCV: 82.4 fL (ref 80.0–100.0)
Monocytes Absolute: 0.6 10*3/uL (ref 0.1–1.0)
Monocytes Relative: 9 %
Neutro Abs: 3.7 10*3/uL (ref 1.7–7.7)
Neutrophils Relative %: 56 %
Platelet Count: 369 10*3/uL (ref 150–400)
RBC: 4.2 MIL/uL (ref 3.87–5.11)
RDW: 16.9 % — ABNORMAL HIGH (ref 11.5–15.5)
WBC Count: 6.4 10*3/uL (ref 4.0–10.5)
nRBC: 0 % (ref 0.0–0.2)

## 2020-10-04 LAB — CMP (CANCER CENTER ONLY)
ALT: 48 U/L — ABNORMAL HIGH (ref 0–44)
AST: 53 U/L — ABNORMAL HIGH (ref 15–41)
Albumin: 2.9 g/dL — ABNORMAL LOW (ref 3.5–5.0)
Alkaline Phosphatase: 789 U/L — ABNORMAL HIGH (ref 38–126)
Anion gap: 12 (ref 5–15)
BUN: 10 mg/dL (ref 8–23)
CO2: 27 mmol/L (ref 22–32)
Calcium: 9.3 mg/dL (ref 8.9–10.3)
Chloride: 102 mmol/L (ref 98–111)
Creatinine: 0.64 mg/dL (ref 0.44–1.00)
GFR, Estimated: >60 mL/min (ref >60)
Glucose, Bld: 88 mg/dL (ref 70–99)
Potassium: 3.7 mmol/L (ref 3.5–5.1)
Sodium: 141 mmol/L (ref 135–145)
Total Bilirubin: 0.5 mg/dL (ref 0.3–1.2)
Total Protein: 7.8 g/dL (ref 6.5–8.1)

## 2020-10-04 LAB — CEA (IN HOUSE-CHCC): CEA (CHCC-In House): 5383.68 ng/mL — ABNORMAL HIGH (ref 0.00–5.00)

## 2020-10-04 LAB — MAGNESIUM: Magnesium: 1.7 mg/dL (ref 1.7–2.4)

## 2020-10-04 MED ORDER — PALONOSETRON HCL INJECTION 0.25 MG/5ML
INTRAVENOUS | Status: AC
  Filled 2020-10-04: qty 5

## 2020-10-04 MED ORDER — SODIUM CHLORIDE 0.9 % IV SOLN
Freq: Once | INTRAVENOUS | Status: AC
  Filled 2020-10-04: qty 250

## 2020-10-04 MED ORDER — IRINOTECAN HCL CHEMO INJECTION 100 MG/5ML
180.0000 mg/m2 | Freq: Once | INTRAVENOUS | Status: AC
  Administered 2020-10-04: 320 mg via INTRAVENOUS
  Filled 2020-10-04: qty 15

## 2020-10-04 MED ORDER — ATROPINE SULFATE 1 MG/ML IJ SOLN
INTRAMUSCULAR | Status: AC
  Filled 2020-10-04: qty 1

## 2020-10-04 MED ORDER — SODIUM CHLORIDE 0.9 % IV SOLN
1600.0000 mg/m2 | INTRAVENOUS | Status: DC
  Administered 2020-10-04: 2800 mg via INTRAVENOUS
  Filled 2020-10-04: qty 56

## 2020-10-04 MED ORDER — PALONOSETRON HCL INJECTION 0.25 MG/5ML
0.2500 mg | Freq: Once | INTRAVENOUS | Status: AC
  Administered 2020-10-04: 0.25 mg via INTRAVENOUS

## 2020-10-04 MED ORDER — SODIUM CHLORIDE 0.9% FLUSH
10.0000 mL | Freq: Once | INTRAVENOUS | Status: AC
  Administered 2020-10-04: 10 mL
  Filled 2020-10-04: qty 10

## 2020-10-04 MED ORDER — ATROPINE SULFATE 1 MG/ML IJ SOLN
0.5000 mg | Freq: Once | INTRAMUSCULAR | Status: AC | PRN
  Administered 2020-10-04: 0.5 mg via INTRAVENOUS

## 2020-10-04 MED ORDER — SODIUM CHLORIDE 0.9 % IV SOLN: 200.0000 mg | Freq: Once | INTRAVENOUS | Status: DC

## 2020-10-04 MED ORDER — SODIUM CHLORIDE 0.9 % IV SOLN
10.0000 mg | Freq: Once | INTRAVENOUS | Status: AC
  Administered 2020-10-04: 10 mg via INTRAVENOUS
  Filled 2020-10-04: qty 10

## 2020-10-04 MED ORDER — SODIUM CHLORIDE 0.9 % IV SOLN
300.0000 mg/m2 | Freq: Once | INTRAVENOUS | Status: AC
  Administered 2020-10-04: 522 mg via INTRAVENOUS
  Filled 2020-10-04: qty 26.1

## 2020-10-04 NOTE — Progress Notes (Signed)
Venofer released in error. Pharmacy, Lucina Mellow, [facility] notified

## 2020-10-04 NOTE — Telephone Encounter (Signed)
Scheduled appt per 3/23 los - pt to get an updated schedule in infusion area.

## 2020-10-04 NOTE — Progress Notes (Signed)
[facility] OFFICE PROGRESS NOTE   Diagnosis: Colon cancer  INTERVAL HISTORY:   Rachel Frank returns as scheduled.  She completed another cycle of FOLFIRI on 09/20/2020.  She reports mild nausea following chemotherapy.  No diarrhea.  She continues to have neuropathy symptoms in the extremities.  She is not taking pain medication. She developed a "boil "at the left labia and saw Dr. Sarajane Frank on 09/29/2020.  She was started on doxycycline.  She reports the lesion has decreased in size.  Objective:  Vital signs in last 24 hours:  Blood pressure (!) 141/82, pulse 77, temperature 97.8 F (36.6 C), temperature source Tympanic, resp. rate 20, height _0  (1.575 m), weight 159 lb (72.1 kg), SpO2 96 %.    HEENT: No thrush or ulcers Resp: Lungs clear bilaterally Cardio: Regular rate and rhythm GI: The liver is palpable throughout the upper abdomen Vascular: No leg edema  Skin: 2-3 cm area of induration with overlying erythema and superficial ulceration at the left labia.  No drainage.  No fluctuance.  Portacath/PICC-without erythema  Lab Results:  Lab Results  Component Value Date   WBC 6.4 10/04/2020   HGB 10.9 (L) 10/04/2020   HCT 34.6 (L) 10/04/2020   MCV 82.4 10/04/2020   PLT 369 10/04/2020   NEUTROABS 3.7 10/04/2020    CMP  Lab Results  Component Value Date   NA 141 09/20/2020   K 3.8 09/20/2020   CL 103 09/20/2020   CO2 27 09/20/2020   GLUCOSE 89 09/20/2020   BUN 10 09/20/2020   CREATININE 0.60 09/20/2020   CALCIUM 9.4 09/20/2020   PROT 8.0 09/20/2020   ALBUMIN 2.9 (L) 09/20/2020   AST 54 (H) 09/20/2020   ALT 57 (H) 09/20/2020   ALKPHOS 714 (H) 09/20/2020   BILITOT 0.5 09/20/2020   GFRNONAA >60 09/20/2020   GFRAA >60 03/30/2020    Lab Results  Component Value Date   CEA1 3,354.56 (H) 09/20/2020   24-hour urine on 09/29/2020: 720 mg of protein  Medications: I have reviewed the patient's current medications.   Assessment/Plan:  1. Stage IV  adenocarcinoma of the cecum. She presented with a large cecal mass, extensive hepatic metastatic disease; mild thoracic and abdominal adenopathy. She also has several small nonspecific pulmonary nodules. She was diagnosed in March 2021.   Colonoscopy 09/16/2019-fungating mass at the cecum, sessile polyps in the this sigmoid, transverse colon, and hepatic flexure, adenocarcinoma involving cecum biopsy, tubular adenoma and sessile serrated polyps  CTs chest, abdomen, and pelvis 10/08/2019-extensive liver metastases, thoracic/abdominal adenopathy, cecal mass, dilated and thin thickened appendix, nonspecific pulmonary nodules  Ultrasound-guided liver biopsy 10/13/2019-adenocarcinoma consistent with a metastatic colorectal primary, MSS, tumor mutation burden 3, KRAS Q61,  Cycle 1 FOLFOX 10/14/2019  Cycle 2 FOLFOX 10/27/2019  Cycle 3 FOLFOX 11/11/2019, Emend and Decadron added, 5-FU dose escalated  Cycle 4 FOLFOX 11/25/2019, Udenyca added  Cycle 5 FOLFOX 12/08/2019  Cycle 6 FOLFOX 12/23/2019  CT 01/04/2020-decreased size of multiple hepatic metastases, slight decrease in bulk of cecal mass, stable ileocecal adenopathy  Cycle 7 FOLFOX 01/06/2020  Cycle 8 FOLFOX 01/31/2020  Cycle 9 FOLFOX 02/17/2020 (oxaliplatin dose reduced due to borderline ANC, persistent cold sensitivity)  Cycle 10 FOLFOX 03/02/2020  Cycle 11 FOLFOX 03/16/2020  Cycle 12 FOLFOX 03/30/2020 (oxaliplatin held secondary to neuropathy)  CT abdomen/pelvis 04/19/2020-no significant change in the abnormal soft tissue lesion in the region of the cecal tip involving the ileocecal valve and appendiceal orifice; slight interval progression of numerous hepatic metastases; similar  appearance of small lymph nodes in the ileocolic mesentery with adjacent irregular focus of soft tissue also stable  Cycle 1 FOLFIRI/Avastin 04/25/2020  Cycle 2 FOLFIRI/Avastin 05/11/2020  Cycle 3 FOLFIRI 05/25/2020 (Avastin held due to proteinuria)  Cycle 4  FOLFIRI/Avastin 06/07/2020  Cycle 5 FOLFIRI/Avastin 06/22/2020  CTs 07/05/2020-mild improvement in multifocal liver metastases.  Mass at the base of the cecum is stable.  Persistent thickening of the appendix.  Cycle 6 FOLFIRI/Avastin 07/05/2020  Cycle 7 FOLFIRI/Avastin 07/20/2020  Cycle 8 FOLFIRI/Avastin 08/03/2020  Cycle 9 FOLFIRI 09/05/2020, Avastin held secondary to proteinuria  CT chest 09/05/2020-negative for pulmonary embolism, no acute disease, unchanged small pulmonary nodules  Cycle 10 FOLFIRI 09/20/2020, Avastin held secondary to proteinuria  Cycle 11 FOLFIRI 10/04/2020, Avastin held secondary to proteinuria 2) Iron deficiency anemia secondary to #1. 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 Diarrhea 01/20/2020. C. difficile negative 7.  COVID-19 infection 08/13/2020, antibody infusion therapy on 08/19/2020 8.  Proteinuria secondary to bevacizumab-confirmed on 24-hour urine 09/08/2020, improved 09/29/2020 9.  Inflamed left labial cyst 09/29/2020-doxycycline     Disposition: Rachel Frank continues treatment with FOLFIRI.  Avastin remains on hold secondary to proteinuria.  The proteinuria was improved on a repeat 24-hour urine 09/29/2020.  Avastin will be resumed if the 24-hour urine has again improved in 2 weeks.  The CEA was higher when she was here 2 weeks ago.  We will follow up on the CEA from today.  She will undergo a restaging CT prior to an office visit in 2 weeks.  She will continue doxycycline for the labial cyst.  We will refer her to gynecology if the inflammation does not resolve.  She will be scheduled for a restaging CT and labs on 10/17/2020.  She will return for an office visit in the next cycle of FOLFIRI on 10/18/2020.  Rachel Coder, MD  10/04/2020  9:38  AM

## 2020-10-04 NOTE — Patient Instructions (Signed)

## 2020-10-06 ENCOUNTER — Inpatient Hospital Stay: Payer: 59

## 2020-10-06 ENCOUNTER — Other Ambulatory Visit: Payer: Self-pay

## 2020-10-06 VITALS — BP 122/93 | HR 69 | Temp 98.2°F | Resp 16

## 2020-10-06 DIAGNOSIS — C19 Malignant neoplasm of rectosigmoid junction: Secondary | ICD-10-CM

## 2020-10-06 DIAGNOSIS — Z5112 Encounter for antineoplastic immunotherapy: Secondary | ICD-10-CM | POA: Diagnosis not present

## 2020-10-06 MED ORDER — SODIUM CHLORIDE 0.9% FLUSH
10.0000 mL | INTRAVENOUS | Status: DC | PRN
  Administered 2020-10-06: 10 mL
  Filled 2020-10-06: qty 10

## 2020-10-06 MED ORDER — HEPARIN SOD (PORK) LOCK FLUSH 100 UNIT/ML IV SOLN
500.0000 [IU] | Freq: Once | INTRAVENOUS | Status: AC | PRN
  Administered 2020-10-06: 500 [IU]
  Filled 2020-10-06: qty 5

## 2020-10-06 MED ORDER — PEGFILGRASTIM-CBQV 6 MG/0.6ML ~~LOC~~ SOSY
PREFILLED_SYRINGE | SUBCUTANEOUS | Status: AC
  Filled 2020-10-06: qty 0.6

## 2020-10-06 MED ORDER — PEGFILGRASTIM-CBQV 6 MG/0.6ML ~~LOC~~ SOSY
6.0000 mg | PREFILLED_SYRINGE | Freq: Once | SUBCUTANEOUS | Status: AC
  Administered 2020-10-06: 6 mg via SUBCUTANEOUS

## 2020-10-06 NOTE — Patient Instructions (Signed)
Pegfilgrastim injection What is this medicine? PEGFILGRASTIM (PEG fil gra stim) is a long-acting granulocyte colony-stimulating factor that stimulates the growth of neutrophils, a type of white blood cell important in the body's fight against infection. It is used to reduce the incidence of fever and infection in patients with certain types of cancer who are receiving chemotherapy that affects the bone marrow, and to increase survival after being exposed to high doses of radiation. This medicine may be used for other purposes; ask your health care provider or pharmacist if you have questions. COMMON BRAND NAME(S): Fulphila, Neulasta, Nyvepria, UDENYCA, Ziextenzo What should I tell my health care provider before I take this medicine? They need to know if you have any of these conditions:  kidney disease  latex allergy  ongoing radiation therapy  sickle cell disease  skin reactions to acrylic adhesives (On-Body Injector only)  an unusual or allergic reaction to pegfilgrastim, filgrastim, other medicines, foods, dyes, or preservatives  pregnant or trying to get pregnant  breast-feeding How should I use this medicine? This medicine is for injection under the skin. If you get this medicine at home, you will be taught how to prepare and give the pre-filled syringe or how to use the On-body Injector. Refer to the patient Instructions for Use for detailed instructions. Use exactly as directed. Tell your healthcare provider immediately if you suspect that the On-body Injector may not have performed as intended or if you suspect the use of the On-body Injector resulted in a missed or partial dose. It is important that you put your used needles and syringes in a special sharps container. Do not put them in a trash can. If you do not have a sharps container, call your pharmacist or healthcare provider to get one. Talk to your pediatrician regarding the use of this medicine in children. While this drug  may be prescribed for selected conditions, precautions do apply. Overdosage: If you think you have taken too much of this medicine contact a poison control center or emergency room at once. NOTE: This medicine is only for you. Do not share this medicine with others. What if I miss a dose? It is important not to miss your dose. Call your doctor or health care professional if you miss your dose. If you miss a dose due to an On-body Injector failure or leakage, a new dose should be administered as soon as possible using a single prefilled syringe for manual use. What may interact with this medicine? Interactions have not been studied. This list may not describe all possible interactions. Give your health care provider a list of all the medicines, herbs, non-prescription drugs, or dietary supplements you use. Also tell them if you smoke, drink alcohol, or use illegal drugs. Some items may interact with your medicine. What should I watch for while using this medicine? Your condition will be monitored carefully while you are receiving this medicine. You may need blood work done while you are taking this medicine. Talk to your health care provider about your risk of cancer. You may be more at risk for certain types of cancer if you take this medicine. If you are going to need a MRI, CT scan, or other procedure, tell your doctor that you are using this medicine (On-Body Injector only). What side effects may I notice from receiving this medicine? Side effects that you should report to your doctor or health care professional as soon as possible:  allergic reactions (skin rash, itching or hives, swelling of   the face, lips, or tongue)  back pain  dizziness  fever  pain, redness, or irritation at site where injected  pinpoint red spots on the skin  red or dark-brown urine  shortness of breath or breathing problems  stomach or side pain, or pain at the shoulder  swelling  tiredness  trouble  passing urine or change in the amount of urine  unusual bruising or bleeding Side effects that usually do not require medical attention (report to your doctor or health care professional if they continue or are bothersome):  bone pain  muscle pain This list may not describe all possible side effects. Call your doctor for medical advice about side effects. You may report side effects to FDA at 1-800-FDA-1088. Where should I keep my medicine? Keep out of the reach of children. If you are using this medicine at home, you will be instructed on how to store it. Throw away any unused medicine after the expiration date on the label. NOTE: This sheet is a summary. It may not cover all possible information. If you have questions about this medicine, talk to your doctor, pharmacist, or health care provider.  2021 Elsevier/Gold Standard (2019-07-23 13:20:51)  

## 2020-10-13 ENCOUNTER — Telehealth: Payer: Self-pay | Admitting: *Deleted

## 2020-10-13 NOTE — Telephone Encounter (Signed)
Called patient with CT appointment on 10/18/20 at 11:00 and NPO 4 hours prior. Contrast at 0900 and 1000 and she confirms she has oral contrast. Moved lab/flush sooner to earlier.

## 2020-10-14 ENCOUNTER — Other Ambulatory Visit: Payer: Self-pay | Admitting: Oncology

## 2020-10-15 ENCOUNTER — Other Ambulatory Visit: Payer: Self-pay | Admitting: Oncology

## 2020-10-17 ENCOUNTER — Inpatient Hospital Stay: Payer: 59

## 2020-10-17 ENCOUNTER — Other Ambulatory Visit (HOSPITAL_BASED_OUTPATIENT_CLINIC_OR_DEPARTMENT_OTHER): Payer: Self-pay

## 2020-10-17 ENCOUNTER — Other Ambulatory Visit: Payer: 59

## 2020-10-17 ENCOUNTER — Other Ambulatory Visit: Payer: Self-pay

## 2020-10-17 ENCOUNTER — Encounter (HOSPITAL_BASED_OUTPATIENT_CLINIC_OR_DEPARTMENT_OTHER): Payer: Self-pay

## 2020-10-17 ENCOUNTER — Inpatient Hospital Stay: Payer: 59 | Attending: Physician Assistant

## 2020-10-17 ENCOUNTER — Ambulatory Visit (HOSPITAL_BASED_OUTPATIENT_CLINIC_OR_DEPARTMENT_OTHER)
Admission: RE | Admit: 2020-10-17 | Discharge: 2020-10-17 | Disposition: A | Discharge status: Home / Self Care | Payer: 59 | Source: Ambulatory Visit | Attending: Oncology | Admitting: Oncology

## 2020-10-17 VITALS — BP 151/99 | HR 79 | Temp 97.9°F | Resp 16

## 2020-10-17 DIAGNOSIS — R918 Other nonspecific abnormal finding of lung field: Secondary | ICD-10-CM | POA: Diagnosis not present

## 2020-10-17 DIAGNOSIS — Z95828 Presence of other vascular implants and grafts: Secondary | ICD-10-CM

## 2020-10-17 DIAGNOSIS — R590 Localized enlarged lymph nodes: Secondary | ICD-10-CM | POA: Diagnosis not present

## 2020-10-17 DIAGNOSIS — R110 Nausea: Secondary | ICD-10-CM | POA: Insufficient documentation

## 2020-10-17 DIAGNOSIS — C787 Secondary malignant neoplasm of liver and intrahepatic bile duct: Secondary | ICD-10-CM | POA: Insufficient documentation

## 2020-10-17 DIAGNOSIS — Z5111 Encounter for antineoplastic chemotherapy: Secondary | ICD-10-CM | POA: Insufficient documentation

## 2020-10-17 DIAGNOSIS — R809 Proteinuria, unspecified: Secondary | ICD-10-CM | POA: Insufficient documentation

## 2020-10-17 DIAGNOSIS — C19 Malignant neoplasm of rectosigmoid junction: Secondary | ICD-10-CM

## 2020-10-17 DIAGNOSIS — G620 Drug-induced polyneuropathy: Secondary | ICD-10-CM | POA: Insufficient documentation

## 2020-10-17 DIAGNOSIS — D509 Iron deficiency anemia, unspecified: Secondary | ICD-10-CM

## 2020-10-17 DIAGNOSIS — Z5189 Encounter for other specified aftercare: Secondary | ICD-10-CM | POA: Insufficient documentation

## 2020-10-17 DIAGNOSIS — T451X5D Adverse effect of antineoplastic and immunosuppressive drugs, subsequent encounter: Secondary | ICD-10-CM | POA: Insufficient documentation

## 2020-10-17 DIAGNOSIS — G893 Neoplasm related pain (acute) (chronic): Secondary | ICD-10-CM | POA: Insufficient documentation

## 2020-10-17 DIAGNOSIS — D259 Leiomyoma of uterus, unspecified: Secondary | ICD-10-CM | POA: Diagnosis not present

## 2020-10-17 DIAGNOSIS — C180 Malignant neoplasm of cecum: Secondary | ICD-10-CM | POA: Insufficient documentation

## 2020-10-17 DIAGNOSIS — R197 Diarrhea, unspecified: Secondary | ICD-10-CM | POA: Insufficient documentation

## 2020-10-17 LAB — CMP (CANCER CENTER ONLY)
ALT: 45 U/L — ABNORMAL HIGH (ref 0–44)
AST: 58 U/L — ABNORMAL HIGH (ref 15–41)
Albumin: 3.6 g/dL (ref 3.5–5.0)
Alkaline Phosphatase: 817 U/L — ABNORMAL HIGH (ref 38–126)
Anion gap: 10 (ref 5–15)
BUN: 9 mg/dL (ref 8–23)
CO2: 28 mmol/L (ref 22–32)
Calcium: 9.2 mg/dL (ref 8.9–10.3)
Chloride: 99 mmol/L (ref 98–111)
Creatinine: 0.47 mg/dL (ref 0.44–1.00)
GFR, Estimated: >60 mL/min (ref >60)
Glucose, Bld: 91 mg/dL (ref 70–99)
Potassium: 3.6 mmol/L (ref 3.5–5.1)
Sodium: 137 mmol/L (ref 135–145)
Total Bilirubin: 0.8 mg/dL (ref 0.3–1.2)
Total Protein: 7.4 g/dL (ref 6.5–8.1)

## 2020-10-17 LAB — CBC WITH DIFFERENTIAL (CANCER CENTER ONLY)
Abs Immature Granulocytes: 0.08 10*3/uL — ABNORMAL HIGH (ref 0.00–0.07)
Basophils Absolute: 0 10*3/uL (ref 0.0–0.1)
Basophils Relative: 1 %
Eosinophils Absolute: 0 10*3/uL (ref 0.0–0.5)
Eosinophils Relative: 0 %
HCT: 36.8 % (ref 36.0–46.0)
Hemoglobin: 11.6 g/dL — ABNORMAL LOW (ref 12.0–15.0)
Immature Granulocytes: 1 %
Lymphocytes Relative: 24 %
Lymphs Abs: 2 10*3/uL (ref 0.7–4.0)
MCH: 26.1 pg (ref 26.0–34.0)
MCHC: 31.5 g/dL (ref 30.0–36.0)
MCV: 82.7 fL (ref 80.0–100.0)
Monocytes Absolute: 0.8 10*3/uL (ref 0.1–1.0)
Monocytes Relative: 9 %
Neutro Abs: 5.4 10*3/uL (ref 1.7–7.7)
Neutrophils Relative %: 65 %
Platelet Count: 331 10*3/uL (ref 150–400)
RBC: 4.45 MIL/uL (ref 3.87–5.11)
RDW: 17.8 % — ABNORMAL HIGH (ref 11.5–15.5)
WBC Count: 8.3 10*3/uL (ref 4.0–10.5)
nRBC: 0 % (ref 0.0–0.2)

## 2020-10-17 LAB — PROTEIN, URINE, 24 HOUR
Collection Interval-UPROT: 24 hours
Protein, 24H Urine: 770 mg/d — ABNORMAL HIGH (ref 50–100)
Protein, Urine: 50 mg/dL
Urine Total Volume-UPROT: 1540 mL

## 2020-10-17 LAB — CEA (IN HOUSE-CHCC): CEA (CHCC-In House): 7601.93 ng/mL — ABNORMAL HIGH (ref 0.00–5.00)

## 2020-10-17 MED ORDER — SODIUM CHLORIDE 0.9 % IV SOLN
Freq: Once | INTRAVENOUS | Status: DC
  Filled 2020-10-17: qty 250

## 2020-10-17 MED ORDER — SODIUM CHLORIDE 0.9% FLUSH
3.0000 mL | Freq: Once | INTRAVENOUS | Status: DC | PRN
  Filled 2020-10-17: qty 10

## 2020-10-17 MED ORDER — HEPARIN SOD (PORK) LOCK FLUSH 100 UNIT/ML IV SOLN
500.0000 [IU] | Freq: Once | INTRAVENOUS | Status: AC
  Administered 2020-10-17: 500 [IU]
  Filled 2020-10-17: qty 5

## 2020-10-17 MED ORDER — DIPHENHYDRAMINE HCL 25 MG PO CAPS: 50.0000 mg | ORAL_CAPSULE | Freq: Once | ORAL | Status: DC

## 2020-10-17 MED ORDER — IOHEXOL 300 MG/ML  SOLN
80.0000 mL | Freq: Once | INTRAMUSCULAR | Status: AC | PRN
  Administered 2020-10-17: 80 mL via INTRAVENOUS

## 2020-10-17 MED ORDER — METHYLPREDNISOLONE SODIUM SUCC 125 MG IJ SOLR: 125.0000 mg | Freq: Once | INTRAMUSCULAR | Status: DC | PRN

## 2020-10-17 MED ORDER — DIPHENHYDRAMINE HCL 50 MG/ML IJ SOLN: 50.0000 mg | Freq: Once | INTRAMUSCULAR | Status: DC | PRN

## 2020-10-17 MED ORDER — SODIUM CHLORIDE 0.9% FLUSH
10.0000 mL | INTRAVENOUS | Status: DC | PRN
  Administered 2020-10-17: 10 mL via INTRAVENOUS
  Filled 2020-10-17: qty 10

## 2020-10-17 MED ORDER — HEPARIN SOD (PORK) LOCK FLUSH 100 UNIT/ML IV SOLN
500.0000 [IU] | Freq: Once | INTRAVENOUS | Status: DC | PRN
  Filled 2020-10-17: qty 5

## 2020-10-17 MED ORDER — HEPARIN SOD (PORK) LOCK FLUSH 100 UNIT/ML IV SOLN
500.0000 [IU] | Freq: Once | INTRAVENOUS | Status: AC
  Administered 2020-10-17: 500 [IU] via INTRAVENOUS
  Filled 2020-10-17: qty 5

## 2020-10-17 MED ORDER — EPINEPHRINE 0.3 MG/0.3ML IJ SOAJ: 0.3000 mg | Freq: Once | INTRAMUSCULAR | Status: DC | PRN

## 2020-10-17 MED ORDER — SODIUM CHLORIDE 0.9% FLUSH
10.0000 mL | Freq: Once | INTRAVENOUS | Status: AC
Start: 2020-10-17 — End: 2020-10-17
  Administered 2020-10-17: 10 mL
  Filled 2020-10-17: qty 10

## 2020-10-17 MED ORDER — HEPARIN SOD (PORK) LOCK FLUSH 100 UNIT/ML IV SOLN
250.0000 [IU] | Freq: Once | INTRAVENOUS | Status: DC | PRN
  Filled 2020-10-17: qty 5

## 2020-10-17 MED ORDER — SODIUM CHLORIDE 0.9 % IV SOLN
Freq: Once | INTRAVENOUS | Status: DC | PRN
  Filled 2020-10-17: qty 250

## 2020-10-17 MED ORDER — ALBUTEROL SULFATE (2.5 MG/3ML) 0.083% IN NEBU
2.5000 mg | INHALATION_SOLUTION | Freq: Once | RESPIRATORY_TRACT | Status: DC | PRN
  Filled 2020-10-17: qty 3

## 2020-10-17 MED ORDER — ACETAMINOPHEN 325 MG PO TABS: 650.0000 mg | ORAL_TABLET | Freq: Once | ORAL | Status: DC

## 2020-10-17 MED ORDER — ALTEPLASE 2 MG IJ SOLR
2.0000 mg | Freq: Once | INTRAMUSCULAR | Status: DC | PRN
  Filled 2020-10-17: qty 2

## 2020-10-17 MED ORDER — FAMOTIDINE IN NACL 20-0.9 MG/50ML-% IV SOLN: 20.0000 mg | Freq: Once | INTRAVENOUS | Status: DC | PRN

## 2020-10-17 NOTE — Patient Instructions (Signed)

## 2020-10-18 ENCOUNTER — Inpatient Hospital Stay (HOSPITAL_BASED_OUTPATIENT_CLINIC_OR_DEPARTMENT_OTHER): Payer: 59 | Admitting: Oncology

## 2020-10-18 ENCOUNTER — Other Ambulatory Visit: Payer: Self-pay | Admitting: Nurse Practitioner

## 2020-10-18 ENCOUNTER — Other Ambulatory Visit: Payer: 59

## 2020-10-18 ENCOUNTER — Telehealth: Payer: Self-pay | Admitting: Oncology

## 2020-10-18 ENCOUNTER — Inpatient Hospital Stay: Payer: 59

## 2020-10-18 ENCOUNTER — Other Ambulatory Visit: Payer: Self-pay

## 2020-10-18 ENCOUNTER — Other Ambulatory Visit: Payer: Self-pay | Admitting: *Deleted

## 2020-10-18 ENCOUNTER — Encounter: Payer: Self-pay | Admitting: *Deleted

## 2020-10-18 VITALS — BP 161/93 | HR 88 | Temp 97.8°F | Resp 18 | Ht 62.0 in | Wt 160.8 lb

## 2020-10-18 DIAGNOSIS — R809 Proteinuria, unspecified: Secondary | ICD-10-CM | POA: Diagnosis not present

## 2020-10-18 DIAGNOSIS — R110 Nausea: Secondary | ICD-10-CM | POA: Diagnosis not present

## 2020-10-18 DIAGNOSIS — C19 Malignant neoplasm of rectosigmoid junction: Secondary | ICD-10-CM | POA: Diagnosis not present

## 2020-10-18 DIAGNOSIS — G893 Neoplasm related pain (acute) (chronic): Secondary | ICD-10-CM

## 2020-10-18 DIAGNOSIS — G620 Drug-induced polyneuropathy: Secondary | ICD-10-CM | POA: Diagnosis not present

## 2020-10-18 DIAGNOSIS — Z5189 Encounter for other specified aftercare: Secondary | ICD-10-CM | POA: Diagnosis not present

## 2020-10-18 DIAGNOSIS — D509 Iron deficiency anemia, unspecified: Secondary | ICD-10-CM | POA: Diagnosis not present

## 2020-10-18 DIAGNOSIS — R197 Diarrhea, unspecified: Secondary | ICD-10-CM | POA: Diagnosis not present

## 2020-10-18 DIAGNOSIS — T451X5D Adverse effect of antineoplastic and immunosuppressive drugs, subsequent encounter: Secondary | ICD-10-CM | POA: Diagnosis not present

## 2020-10-18 DIAGNOSIS — Z5111 Encounter for antineoplastic chemotherapy: Secondary | ICD-10-CM | POA: Diagnosis present

## 2020-10-18 DIAGNOSIS — C180 Malignant neoplasm of cecum: Secondary | ICD-10-CM | POA: Diagnosis present

## 2020-10-18 MED ORDER — SODIUM CHLORIDE 0.9 % IV SOLN
1600.0000 mg/m2 | INTRAVENOUS | Status: DC
  Administered 2020-10-18: 2800 mg via INTRAVENOUS
  Filled 2020-10-18: qty 56

## 2020-10-18 MED ORDER — PALONOSETRON HCL INJECTION 0.25 MG/5ML
0.2500 mg | Freq: Once | INTRAVENOUS | Status: AC
  Administered 2020-10-18: 0.25 mg via INTRAVENOUS
  Filled 2020-10-18: qty 5

## 2020-10-18 MED ORDER — SODIUM CHLORIDE 0.9 % IV SOLN
Freq: Once | INTRAVENOUS | Status: AC
  Filled 2020-10-18: qty 250

## 2020-10-18 MED ORDER — SODIUM CHLORIDE 0.9 % IV SOLN
10.0000 mg | Freq: Once | INTRAVENOUS | Status: AC
  Administered 2020-10-18: 10 mg via INTRAVENOUS
  Filled 2020-10-18: qty 1

## 2020-10-18 MED ORDER — ATROPINE SULFATE 1 MG/ML IJ SOLN
0.5000 mg | Freq: Once | INTRAMUSCULAR | Status: AC | PRN
  Administered 2020-10-18: 0.5 mg via INTRAVENOUS
  Filled 2020-10-18: qty 1

## 2020-10-18 MED ORDER — OXYCODONE HCL 5 MG PO TABS: 5.0000 mg | ORAL_TABLET | Freq: Four times a day (QID) | ORAL | 0 refills | Status: DC | PRN

## 2020-10-18 MED ORDER — SODIUM CHLORIDE 0.9 % IV SOLN
180.0000 mg/m2 | Freq: Once | INTRAVENOUS | Status: AC
  Administered 2020-10-18: 320 mg via INTRAVENOUS
  Filled 2020-10-18: qty 5

## 2020-10-18 MED ORDER — SODIUM CHLORIDE 0.9 % IV SOLN
300.0000 mg/m2 | Freq: Once | INTRAVENOUS | Status: AC
  Administered 2020-10-18: 522 mg via INTRAVENOUS
  Filled 2020-10-18: qty 26.1

## 2020-10-18 NOTE — Progress Notes (Signed)
[place] OFFICE PROGRESS NOTE   Diagnosis: Colon cancer  INTERVAL HISTORY:   Ms. Brummet returns as scheduled.  She completed another cycle of FOLFIRI on 10/04/2020.  She reports nausea following chemotherapy, but no emesis.  No mouth sores or diarrhea.  She has discomfort in the back and right abdomen.  She takes oxycodone occasionally.  Her chief complaint is malaise.  Objective:  Vital signs in last 24 hours:  Blood pressure (!) 161/93, pulse 88, temperature 97.8 F (36.6 C), temperature source Tympanic, resp. rate 18, height $RemoveBe'5\' 2"'cPaTgzUdw$  (1.575 m), weight 160 lb 12.8 oz (72.9 kg), SpO2 100 %.    HEENT: No thrush or ulcers Resp: Lungs clear bilaterally Cardio: Regular rate and rhythm GI: The liver is palpable in the right upper abdomen, no splenomegaly Vascular: No leg edema  Skin: Mild hyperpigmentation of the hands  Portacath/PICC-without erythema  Lab Results:  Lab Results  Component Value Date   WBC 8.3 10/17/2020   HGB 11.6 (L) 10/17/2020   HCT 36.8 10/17/2020   MCV 82.7 10/17/2020   PLT 331 10/17/2020   NEUTROABS 5.4 10/17/2020    CMP  Lab Results  Component Value Date   NA 137 10/17/2020   K 3.6 10/17/2020   CL 99 10/17/2020   CO2 28 10/17/2020   GLUCOSE 91 10/17/2020   BUN 9 10/17/2020   CREATININE 0.47 10/17/2020   CALCIUM 9.2 10/17/2020   PROT 7.4 10/17/2020   ALBUMIN 3.6 10/17/2020   AST 58 (H) 10/17/2020   ALT 45 (H) 10/17/2020   ALKPHOS 817 (H) 10/17/2020   BILITOT 0.8 10/17/2020   GFRNONAA >60 10/17/2020   GFRAA >60 03/30/2020    Lab Results  Component Value Date   CEA1 7,601.93 (H) 10/17/2020    Lab Results  Component Value Date   INR 1.5 (H) 10/13/2019    Imaging:  CT CHEST ABDOMEN PELVIS W CONTRAST  Result Date: 10/18/2020 CLINICAL DATA:  Follow-up metastatic colon carcinoma. EXAM: CT CHEST, ABDOMEN, AND PELVIS WITH CONTRAST TECHNIQUE: Multidetector CT imaging of the chest, abdomen and pelvis was performed  following the standard protocol during bolus administration of intravenous contrast. CONTRAST:  55mL OMNIPAQUE IOHEXOL 300 MG/ML  SOLN COMPARISON:  Chest CTA on 09/05/2020, and AP CT on 07/05/2020 FINDINGS: CT CHEST FINDINGS Cardiovascular: No acute findings. Mediastinum/Lymph Nodes: No masses or pathologically enlarged lymph nodes identified. Lungs/Pleura: A few scattered tiny sub-cm pulmonary nodules are again seen bilaterally, unchanged compared to multiple prior studies, suggesting benign etiology. No new or enlarging pulmonary nodules or masses identified. No evidence of infiltrate or pleural effusion. Musculoskeletal:  No suspicious bone lesions identified. CT ABDOMEN AND PELVIS FINDINGS Hepatobiliary: Diffuse hepatic metastases are less well-defined in appearance compared to previous study. Some hepatic metastases are increased in size and more confluent than on prior study. Index lesion in the posterior right hepatic lobe currently measures 9.5 x 7.1 cm on image 55/2, compared to 8.0 x 6.3 cm previously. Another mass in the lateral segment of the left lobe measures 6.0 x 4.8 cm on image 50/2, compared to 4.7 x 4.0 cm at same level previously. Gallbladder is unremarkable. No evidence of biliary ductal dilatation. Pancreas:  No mass or inflammatory changes. Spleen:  Within normal limits in size and appearance. Adrenals/Urinary tract: Less than 1 cm calculus in upper pole of right kidney remains stable. No masses or hydronephrosis. Stomach/Bowel: Soft tissue mass is again seen in the cecum which measures 3.5 x 2.8 cm on image 95/2, without  significant change in size compared to prior exam. No evidence of bowel obstruction. Vascular/Lymphatic: Mild right lower quadrant mesenteric lymphadenopathy shows slight decrease in size, with largest lymph node measuring 11 mm on image 89/2 compared to 14 mm previously. No other sites of lymphadenopathy identified. No abdominal aortic aneurysm. Reproductive: Several  calcified uterine fibroids show no significant change, largest measuring 3.5 cm. Adnexal regions are unremarkable. Tiny amount of free fluid again seen in pelvic cul-de-sac, without significant change. Other:  None. Musculoskeletal:  No suspicious bone lesions identified. IMPRESSION: Diffuse hepatic metastases show mild progression since prior study. Stable soft tissue masses cecum. Slight decrease in right lower quadrant mesenteric lymphadenopathy. Stable calcified uterine fibroids. Stable sub-cm bilateral pulmonary nodules, suggesting benign etiology. Recommend continued attention on follow-up imaging. Electronically Signed   By: Marlaine Hind M.D.   On: 10/18/2020 10:41    Medications: I have reviewed the patient's current medications.   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 She presented with a large cecal mass, extensive hepatic metastatic disease; mild thoracic and abdominal adenopathy. She also has several small nonspecific pulmonary nodules. She was diagnosed in March 2021.   Colonoscopy 09/16/2019-fungating mass at the cecum, sessile polyps in the this sigmoid, transverse colon, and hepatic flexure, adenocarcinoma involving cecum biopsy, tubular adenoma and sessile serrated polyps  CTs chest, abdomen, and pelvis 10/08/2019-extensive liver metastases, thoracic/abdominal adenopathy, cecal mass, dilated and thin thickened appendix, nonspecific pulmonary nodules  Ultrasound-guided liver biopsy 10/13/2019-adenocarcinoma consistent with a metastatic colorectal primary, MSS, tumor mutation burden 3, KRAS Q61,  Cycle 1 FOLFOX 10/14/2019  Cycle 2 FOLFOX 10/27/2019  Cycle 3 FOLFOX 11/11/2019, Emend and Decadron added, 5-FU dose escalated  Cycle 4 FOLFOX 11/25/2019, Udenyca added  Cycle 5 FOLFOX 12/08/2019  Cycle 6 FOLFOX 12/23/2019  CT 01/04/2020-decreased size of multiple hepatic metastases, slight decrease in bulk of cecal mass, stable ileocecal adenopathy  Cycle 7 FOLFOX  01/06/2020  Cycle 8 FOLFOX 01/31/2020  Cycle 9 FOLFOX 02/17/2020 (oxaliplatin dose reduced due to borderline ANC, persistent cold sensitivity)  Cycle 10 FOLFOX 03/02/2020  Cycle 11 FOLFOX 03/16/2020  Cycle 12 FOLFOX 03/30/2020 (oxaliplatin held secondary to neuropathy)  CT abdomen/pelvis 04/19/2020-no significant change in the abnormal soft tissue lesion in the region of the cecal tip involving the ileocecal valve and appendiceal orifice; slight interval progression of numerous hepatic metastases; similar appearance of small lymph nodes in the ileocolic mesentery with adjacent irregular focus of soft tissue also stable  Cycle 1 FOLFIRI/Avastin 04/25/2020  Cycle 2 FOLFIRI/Avastin 05/11/2020  Cycle 3 FOLFIRI 05/25/2020 (Avastin held due to proteinuria)  Cycle 4 FOLFIRI/Avastin 06/07/2020  Cycle 5 FOLFIRI/Avastin 06/22/2020  CTs 07/05/2020-mild improvement in multifocal liver metastases.  Mass at the base of the cecum is stable.  Persistent thickening of the appendix.  Cycle 6 FOLFIRI/Avastin 07/05/2020  Cycle 7 FOLFIRI/Avastin 07/20/2020  Cycle 8 FOLFIRI/Avastin 08/03/2020  Cycle 9 FOLFIRI 09/05/2020, Avastin held secondary to proteinuria  CT chest 09/05/2020-negative for pulmonary embolism, no acute disease, unchanged small pulmonary nodules  Cycle 10 FOLFIRI 09/20/2020, Avastin held secondary to proteinuria  Cycle 11 FOLFIRI 10/04/2020, Avastin held secondary to proteinuria  CTs 10/17/2020-increase in size of liver metastases, stable cecal mass, slight decrease in size of right lower quadrant mesenteric lymph node  Cycle 12 FOLFIRI 10/18/2020, Avastin held secondary to proteinuria 2) Iron deficiency anemia secondary to #1. 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 Diarrhea 01/20/2020.  C. difficile negative 7.  COVID-19 infection 08/13/2020, antibody infusion therapy on 08/19/2020 8.  Proteinuria secondary to bevacizumab-confirmed on 24-hour urine 09/08/2020, improved 09/29/2020 9.  Inflamed left labial cyst 09/29/2020-doxycycline     Disposition: Ms. Gwynn has metastatic colon cancer.  She has completed 11 cycles of FOLFIRI therapy.  Avastin was given with the first 8 cycles of treatment.  Avastin remains on hold secondary to persistent proteinuria.  The CEA has been higher on the past several readings.  The restaging CT is consistent with progressive disease in the liver.  I reviewed the CT and discussed treatment options with Ms. Eckford and her daughter.  I feel the disease is progressing despite FOLFIRI/Avastin.  Ms. Hagadorn would like to complete at least 1 more cycle of FOLFIRI since there was a treatment break in January/February when she was diagnosed with COVID-19 infection.  She will complete another cycle today.  We discussed salvage treatment options including Lonsurf, regorafenib, and referral for clinical trial.  The tumor has a K-ras mutation so she will not be a candidate for an EGFR inhibitor.  I will refer her to Dr. Reynaldo Minium for second opinion and to investigate clinical trial availability at [facility].  Ms. Lisle will return for an office visit in 3 weeks.  I refilled her prescription for oxycodone.  Betsy Coder, MD  10/18/2020  11:38 AM

## 2020-10-18 NOTE — Progress Notes (Signed)
Ok to treat despite counts per Dr. Benay Spice.

## 2020-10-18 NOTE — Telephone Encounter (Signed)
Scheduled appt per 4/6 los - gave patient AVS and calender in infusion

## 2020-10-18 NOTE — Patient Instructions (Signed)
[facility] Discharge Instructions for Patients Receiving Chemotherapy  Today you received the following chemotherapy agents Irrinotecan, Leukovorin, 5FU  To help prevent nausea and vomiting after your treatment, we encourage you to take your nausea medication as directed. If you develop nausea and vomiting that is not controlled by your nausea medication, call the clinic.   BELOW ARE SYMPTOMS THAT SHOULD BE REPORTED IMMEDIATELY:  *FEVER GREATER THAN 100.5 F  *CHILLS WITH OR WITHOUT FEVER  NAUSEA AND VOMITING THAT IS NOT CONTROLLED WITH YOUR NAUSEA MEDICATION  *UNUSUAL SHORTNESS OF BREATH  *UNUSUAL BRUISING OR BLEEDING  TENDERNESS IN MOUTH AND THROAT WITH OR WITHOUT PRESENCE OF ULCERS  *URINARY PROBLEMS  *BOWEL PROBLEMS  UNUSUAL RASH Items with * indicate a potential emergency and should be followed up as soon as possible.  Feel free to call the clinic should you have any questions or concerns. The clinic phone number is [phone].  Please show the [facility] at check-in to the Emergency Department and triage nurse.

## 2020-10-18 NOTE — Progress Notes (Signed)
Faxed referral order to [facility]/Dr. Verlan Friends [phone]. Sent demographics, insurance card, med list, chemo flow sheet and [facility] results. Requested radiology push over last two scans to [facility].

## 2020-10-19 ENCOUNTER — Other Ambulatory Visit: Payer: Self-pay | Admitting: *Deleted

## 2020-10-19 DIAGNOSIS — C19 Malignant neoplasm of rectosigmoid junction: Secondary | ICD-10-CM

## 2020-10-20 ENCOUNTER — Other Ambulatory Visit: Payer: Self-pay

## 2020-10-20 ENCOUNTER — Inpatient Hospital Stay: Payer: 59

## 2020-10-20 VITALS — BP 163/91 | HR 80 | Temp 97.7°F | Resp 18

## 2020-10-20 DIAGNOSIS — Z5111 Encounter for antineoplastic chemotherapy: Secondary | ICD-10-CM | POA: Diagnosis not present

## 2020-10-20 DIAGNOSIS — C19 Malignant neoplasm of rectosigmoid junction: Secondary | ICD-10-CM

## 2020-10-20 MED ORDER — HEPARIN SOD (PORK) LOCK FLUSH 100 UNIT/ML IV SOLN
500.0000 [IU] | Freq: Once | INTRAVENOUS | Status: AC | PRN
  Administered 2020-10-20: 500 [IU]
  Filled 2020-10-20: qty 5

## 2020-10-20 MED ORDER — SODIUM CHLORIDE 0.9% FLUSH
10.0000 mL | INTRAVENOUS | Status: DC | PRN
  Administered 2020-10-20: 10 mL
  Filled 2020-10-20: qty 10

## 2020-10-20 MED ORDER — PEGFILGRASTIM-CBQV 6 MG/0.6ML ~~LOC~~ SOSY
6.0000 mg | PREFILLED_SYRINGE | Freq: Once | SUBCUTANEOUS | Status: AC
  Administered 2020-10-20: 6 mg via SUBCUTANEOUS
  Filled 2020-10-20: qty 0.6

## 2020-10-20 NOTE — Patient Instructions (Signed)
Patient tolerated 51fu infusion without any problems. Discharged ambulatory and in stable condition.

## 2020-11-05 ENCOUNTER — Other Ambulatory Visit: Payer: Self-pay | Admitting: Oncology

## 2020-11-08 ENCOUNTER — Inpatient Hospital Stay: Payer: 59

## 2020-11-08 ENCOUNTER — Inpatient Hospital Stay (HOSPITAL_BASED_OUTPATIENT_CLINIC_OR_DEPARTMENT_OTHER): Payer: 59 | Admitting: Oncology

## 2020-11-08 ENCOUNTER — Other Ambulatory Visit: Payer: Self-pay

## 2020-11-08 VITALS — BP 133/83 | HR 93 | Temp 98.0°F | Resp 20 | Ht 62.0 in | Wt 162.2 lb

## 2020-11-08 DIAGNOSIS — C19 Malignant neoplasm of rectosigmoid junction: Secondary | ICD-10-CM

## 2020-11-08 DIAGNOSIS — Z5111 Encounter for antineoplastic chemotherapy: Secondary | ICD-10-CM | POA: Diagnosis not present

## 2020-11-08 LAB — CMP (CANCER CENTER ONLY)
ALT: 35 U/L (ref 0–44)
AST: 79 U/L — ABNORMAL HIGH (ref 15–41)
Albumin: 3 g/dL — ABNORMAL LOW (ref 3.5–5.0)
Alkaline Phosphatase: 610 U/L — ABNORMAL HIGH (ref 38–126)
Anion gap: 8 (ref 5–15)
BUN: 17 mg/dL (ref 8–23)
CO2: 25 mmol/L (ref 22–32)
Calcium: 9 mg/dL (ref 8.9–10.3)
Chloride: 96 mmol/L — ABNORMAL LOW (ref 98–111)
Creatinine: 0.45 mg/dL (ref 0.44–1.00)
GFR, Estimated: >60 mL/min (ref >60)
Glucose, Bld: 94 mg/dL (ref 70–99)
Potassium: 4.3 mmol/L (ref 3.5–5.1)
Sodium: 129 mmol/L — ABNORMAL LOW (ref 135–145)
Total Bilirubin: 3.4 mg/dL — ABNORMAL HIGH (ref 0.3–1.2)
Total Protein: 6.7 g/dL (ref 6.5–8.1)

## 2020-11-08 LAB — CBC WITH DIFFERENTIAL (CANCER CENTER ONLY)
Abs Immature Granulocytes: 0.06 10*3/uL (ref 0.00–0.07)
Basophils Absolute: 0.1 10*3/uL (ref 0.0–0.1)
Basophils Relative: 0 %
Eosinophils Absolute: 0 10*3/uL (ref 0.0–0.5)
Eosinophils Relative: 0 %
HCT: 33.3 % — ABNORMAL LOW (ref 36.0–46.0)
Hemoglobin: 10.5 g/dL — ABNORMAL LOW (ref 12.0–15.0)
Immature Granulocytes: 0 %
Lymphocytes Relative: 11 %
Lymphs Abs: 1.7 10*3/uL (ref 0.7–4.0)
MCH: 26.5 pg (ref 26.0–34.0)
MCHC: 31.5 g/dL (ref 30.0–36.0)
MCV: 84.1 fL (ref 80.0–100.0)
Monocytes Absolute: 2.7 10*3/uL — ABNORMAL HIGH (ref 0.1–1.0)
Monocytes Relative: 18 %
Neutro Abs: 10.7 10*3/uL — ABNORMAL HIGH (ref 1.7–7.7)
Neutrophils Relative %: 71 %
Platelet Count: 377 10*3/uL (ref 150–400)
RBC: 3.96 MIL/uL (ref 3.87–5.11)
RDW: 19 % — ABNORMAL HIGH (ref 11.5–15.5)
WBC Count: 15.3 10*3/uL — ABNORMAL HIGH (ref 4.0–10.5)
nRBC: 0 % (ref 0.0–0.2)

## 2020-11-08 LAB — CEA (ACCESS): CEA (CHCC): 13127.09 ng/mL — ABNORMAL HIGH (ref 0.00–5.00)

## 2020-11-08 LAB — CEA (IN HOUSE-CHCC): CEA (CHCC-In House): 10539.31 ng/mL — ABNORMAL HIGH (ref 0.00–5.00)

## 2020-11-08 NOTE — Progress Notes (Signed)
[place] OFFICE PROGRESS NOTE   Diagnosis: Colon cancer  INTERVAL HISTORY:   Rachel Frank returns as scheduled.  She saw Dr. Reynaldo Minium on 10/26/2020.  She was placed on a waiting list for a clinical trial . Dr. Reynaldo Minium recommends proceeding with Lonsurf/bevacizumab.  Rachel Frank has increased pain in the right abdomen.  She has pain with inspiration at the right costal margin.  Objective:  Vital signs in last 24 hours:  Blood pressure 133/83, pulse 93, temperature 98 F (36.7 C), temperature source Oral, resp. rate 20, height _0  (1.575 m), weight 162 lb 3.2 oz (73.6 kg), SpO2 100 %.    HEENT: Mild scleral icterus Resp: Lungs clear bilaterally Cardio: Regular rate and rhythm GI: The liver is palpable throughout the upper abdomen Vascular: No leg edema    Portacath/PICC-without erythema  Lab Results:  Lab Results  Component Value Date   WBC 15.3 (H) 11/08/2020   HGB 10.5 (L) 11/08/2020   HCT 33.3 (L) 11/08/2020   MCV 84.1 11/08/2020   PLT 377 11/08/2020   NEUTROABS 10.7 (H) 11/08/2020    CMP  Lab Results  Component Value Date   NA 129 (L) 11/08/2020   K 4.3 11/08/2020   CL 96 (L) 11/08/2020   CO2 25 11/08/2020   GLUCOSE 94 11/08/2020   BUN 17 11/08/2020   CREATININE 0.45 11/08/2020   CALCIUM 9.0 11/08/2020   PROT 6.7 11/08/2020   ALBUMIN 3.0 (L) 11/08/2020   AST 79 (H) 11/08/2020   ALT 35 11/08/2020   ALKPHOS 610 (H) 11/08/2020   BILITOT 3.4 (H) 11/08/2020   GFRNONAA >60 11/08/2020   GFRAA >60 03/30/2020    Lab Results  Component Value Date   CEA1 3,500.93 (H) 10/17/2020     Medications: I have reviewed the patient's current medications.   Assessment/Plan:   1. Stage IV adenocarcinoma of the cecum. She presented with a large cecal mass, extensive hepatic metastatic disease; mild thoracic and abdominal adenopathy. She also has several small nonspecific pulmonary nodules. She was diagnosed in March 2021.   Colonoscopy  09/16/2019-fungating mass at the cecum, sessile polyps in the this sigmoid, transverse colon, and hepatic flexure, adenocarcinoma involving cecum biopsy, tubular adenoma and sessile serrated polyps  CTs chest, abdomen, and pelvis 10/08/2019-extensive liver metastases, thoracic/abdominal adenopathy, cecal mass, dilated and thin thickened appendix, nonspecific pulmonary nodules  Ultrasound-guided liver biopsy 10/13/2019-adenocarcinoma consistent with a metastatic colorectal primary, MSS, tumor mutation burden 3, KRAS Q61,  Cycle 1 FOLFOX 10/14/2019  Cycle 2 FOLFOX 10/27/2019  Cycle 3 FOLFOX 11/11/2019, Emend and Decadron added, 5-FU dose escalated  Cycle 4 FOLFOX 11/25/2019, Udenyca added  Cycle 5 FOLFOX 12/08/2019  Cycle 6 FOLFOX 12/23/2019  CT 01/04/2020-decreased size of multiple hepatic metastases, slight decrease in bulk of cecal mass, stable ileocecal adenopathy  Cycle 7 FOLFOX 01/06/2020  Cycle 8 FOLFOX 01/31/2020  Cycle 9 FOLFOX 02/17/2020 (oxaliplatin dose reduced due to borderline ANC, persistent cold sensitivity)  Cycle 10 FOLFOX 03/02/2020  Cycle 11 FOLFOX 03/16/2020  Cycle 12 FOLFOX 03/30/2020 (oxaliplatin held secondary to neuropathy)  CT abdomen/pelvis 04/19/2020-no significant change in the abnormal soft tissue lesion in the region of the cecal tip involving the ileocecal valve and appendiceal orifice; slight interval progression of numerous hepatic metastases; similar appearance of small lymph nodes in the ileocolic mesentery with adjacent irregular focus of soft tissue also stable  Cycle 1 FOLFIRI/Avastin 04/25/2020  Cycle 2 FOLFIRI/Avastin 05/11/2020  Cycle 3 FOLFIRI 05/25/2020 (Avastin held due to proteinuria)  Cycle 4  FOLFIRI/Avastin 06/07/2020  Cycle 5 FOLFIRI/Avastin 06/22/2020  CTs 07/05/2020-mild improvement in multifocal liver metastases.  Mass at the base of the cecum is stable.  Persistent thickening of the appendix.  Cycle 6 FOLFIRI/Avastin 07/05/2020  Cycle 7  FOLFIRI/Avastin 07/20/2020  Cycle 8 FOLFIRI/Avastin 08/03/2020  Cycle 9 FOLFIRI 09/05/2020, Avastin held secondary to proteinuria  CT chest 09/05/2020-negative for pulmonary embolism, no acute disease, unchanged small pulmonary nodules  Cycle 10 FOLFIRI 09/20/2020, Avastin held secondary to proteinuria  Cycle 11 FOLFIRI 10/04/2020, Avastin held secondary to proteinuria  CTs 10/17/2020-increase in size of liver metastases, stable cecal mass, slight decrease in size of right lower quadrant mesenteric lymph node  Cycle 12 FOLFIRI 10/18/2020, Avastin held secondary to proteinuria 2) Iron deficiency anemia secondary to #1. 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 Diarrhea 01/20/2020. C. difficile negative 7.  COVID-19 infection 08/13/2020, antibody infusion therapy on 08/19/2020 8.  Proteinuria secondary to bevacizumab-confirmed on 24-hour urine 09/08/2020, improved 09/29/2020 9.  Inflamed left labial cyst 09/29/2020-doxycycline     Disposition: Rachel Frank has metastatic colon cancer.  There is clinical and radiologic evidence of disease progression.  The CEA is higher.  I discussed treatment options with Rachel Frank and her daughter.  The plan was to begin Lonsurf/bevacizumab, but the bilirubin is more elevated today.  I confirmed with our pharmacy that wants her should not be given with the bilirubin at the current level. Rachel Frank understands treatment options are limited.  We can consider treatment with regorafenib.  I will consult with Dr. Reynaldo Minium to get his thoughts.  She will return for an office visit on 11/13/2020.  She will continue oxycodone as needed for pain.  Rachel Coder, MD  11/08/2020  10:14 AM

## 2020-11-08 NOTE — Progress Notes (Signed)
Patient's port accessed for chemo. Patient tolerated well.

## 2020-11-09 ENCOUNTER — Telehealth: Payer: Self-pay

## 2020-11-09 ENCOUNTER — Other Ambulatory Visit: Payer: Self-pay | Admitting: Nurse Practitioner

## 2020-11-09 DIAGNOSIS — C19 Malignant neoplasm of rectosigmoid junction: Secondary | ICD-10-CM

## 2020-11-09 MED ORDER — FLUCONAZOLE 100 MG PO TABS: 50.0000 mg | ORAL_TABLET | Freq: Every day | ORAL | 0 refills | Status: AC

## 2020-11-09 NOTE — Telephone Encounter (Signed)
-----   Message from Owens Shark, NP sent at 11/09/2020 10:44 AM EDT ----- Please tell her to permanently discontinue Zocor.  Thanks

## 2020-11-09 NOTE — Telephone Encounter (Signed)
TC to Pt to inform her that a prescription was called in for diflucan for the thrush in her mouth. Per Dr. Benay Spice informed her that she is to discontinue taking Zocor. Pt verbalized understanding. No further problems or concerns noted.

## 2020-11-12 ENCOUNTER — Other Ambulatory Visit: Payer: Self-pay | Admitting: Oncology

## 2020-11-12 NOTE — Progress Notes (Signed)
DISCONTINUE ON PATHWAY REGIMEN - Colorectal     A cycle is every 14 days:     Bevacizumab-xxxx      Irinotecan      Leucovorin      Fluorouracil      Fluorouracil   **Always confirm dose/schedule in your pharmacy ordering system**  REASON: Disease Progression PRIOR TREATMENT: [id number]: FOLFIRI + Bevacizumab q14 Days TREATMENT RESPONSE: Progressive Disease (PD)  START ON PATHWAY REGIMEN - Colorectal     A cycle is every 14 days:     Oxaliplatin      Leucovorin      Fluorouracil      Fluorouracil   **Always confirm dose/schedule in your pharmacy ordering system**  Patient Characteristics: Distant Metastases, Nonsurgical Candidate, KRAS/NRAS Mutation Positive/Unknown (BRAF V600 Wild-Type/Unknown), Standard Cytotoxic Therapy, Third Line Standard Cytotoxic Therapy Tumor Location: Colon Therapeutic Status: Distant Metastases Microsatellite/Mismatch Repair Status: MSS/pMMR BRAF Mutation Status: Wild-Type (no mutation) KRAS/NRAS Mutation Status: Mutation Positive Standard Cytotoxic Line of Therapy: Third Building services engineer Cytotoxic Therapy Intent of Therapy: Non-Curative / Palliative Intent, Discussed with Patient

## 2020-11-13 ENCOUNTER — Inpatient Hospital Stay: Payer: 59 | Attending: Physician Assistant

## 2020-11-13 ENCOUNTER — Inpatient Hospital Stay: Payer: 59

## 2020-11-13 ENCOUNTER — Other Ambulatory Visit: Payer: Self-pay

## 2020-11-13 ENCOUNTER — Inpatient Hospital Stay (HOSPITAL_BASED_OUTPATIENT_CLINIC_OR_DEPARTMENT_OTHER): Payer: 59 | Admitting: Nurse Practitioner

## 2020-11-13 ENCOUNTER — Encounter: Payer: Self-pay | Admitting: Nurse Practitioner

## 2020-11-13 VITALS — BP 131/84 | HR 97 | Temp 97.8°F | Resp 18 | Ht 62.0 in | Wt 159.6 lb

## 2020-11-13 DIAGNOSIS — Z5189 Encounter for other specified aftercare: Secondary | ICD-10-CM | POA: Insufficient documentation

## 2020-11-13 DIAGNOSIS — R809 Proteinuria, unspecified: Secondary | ICD-10-CM | POA: Insufficient documentation

## 2020-11-13 DIAGNOSIS — C180 Malignant neoplasm of cecum: Secondary | ICD-10-CM | POA: Insufficient documentation

## 2020-11-13 DIAGNOSIS — C19 Malignant neoplasm of rectosigmoid junction: Secondary | ICD-10-CM

## 2020-11-13 DIAGNOSIS — G893 Neoplasm related pain (acute) (chronic): Secondary | ICD-10-CM | POA: Diagnosis not present

## 2020-11-13 DIAGNOSIS — Z5112 Encounter for antineoplastic immunotherapy: Secondary | ICD-10-CM | POA: Diagnosis not present

## 2020-11-13 DIAGNOSIS — Z5111 Encounter for antineoplastic chemotherapy: Secondary | ICD-10-CM | POA: Diagnosis present

## 2020-11-13 DIAGNOSIS — D509 Iron deficiency anemia, unspecified: Secondary | ICD-10-CM | POA: Diagnosis not present

## 2020-11-13 DIAGNOSIS — T451X5D Adverse effect of antineoplastic and immunosuppressive drugs, subsequent encounter: Secondary | ICD-10-CM | POA: Diagnosis not present

## 2020-11-13 DIAGNOSIS — R110 Nausea: Secondary | ICD-10-CM | POA: Insufficient documentation

## 2020-11-13 DIAGNOSIS — G620 Drug-induced polyneuropathy: Secondary | ICD-10-CM | POA: Diagnosis not present

## 2020-11-13 LAB — CMP (CANCER CENTER ONLY)
ALT: 43 U/L (ref 0–44)
AST: 112 U/L — ABNORMAL HIGH (ref 15–41)
Albumin: 2.8 g/dL — ABNORMAL LOW (ref 3.5–5.0)
Alkaline Phosphatase: 631 U/L — ABNORMAL HIGH (ref 38–126)
Anion gap: 10 (ref 5–15)
BUN: 16 mg/dL (ref 8–23)
CO2: 23 mmol/L (ref 22–32)
Calcium: 9 mg/dL (ref 8.9–10.3)
Chloride: 97 mmol/L — ABNORMAL LOW (ref 98–111)
Creatinine: 0.56 mg/dL (ref 0.44–1.00)
GFR, Estimated: >60 mL/min (ref >60)
Glucose, Bld: 104 mg/dL — ABNORMAL HIGH (ref 70–99)
Potassium: 5 mmol/L (ref 3.5–5.1)
Sodium: 130 mmol/L — ABNORMAL LOW (ref 135–145)
Total Bilirubin: 3.8 mg/dL (ref 0.3–1.2)
Total Protein: 7.2 g/dL (ref 6.5–8.1)

## 2020-11-13 MED ORDER — SODIUM CHLORIDE 0.9 % IV SOLN
150.0000 mg | Freq: Once | INTRAVENOUS | Status: AC
  Administered 2020-11-13: 150 mg via INTRAVENOUS
  Filled 2020-11-13: qty 5

## 2020-11-13 MED ORDER — PALONOSETRON HCL INJECTION 0.25 MG/5ML
0.2500 mg | Freq: Once | INTRAVENOUS | Status: AC
  Administered 2020-11-13: 0.25 mg via INTRAVENOUS
  Filled 2020-11-13: qty 5

## 2020-11-13 MED ORDER — SODIUM CHLORIDE 0.9 % IV SOLN
10.0000 mg | Freq: Once | INTRAVENOUS | Status: AC
  Administered 2020-11-13: 10 mg via INTRAVENOUS
  Filled 2020-11-13: qty 1

## 2020-11-13 MED ORDER — OXALIPLATIN CHEMO INJECTION 100 MG/20ML
65.0000 mg/m2 | Freq: Once | INTRAVENOUS | Status: AC
  Administered 2020-11-13: 115 mg via INTRAVENOUS
  Filled 2020-11-13: qty 23

## 2020-11-13 MED ORDER — SODIUM CHLORIDE 0.9 % IV SOLN
Freq: Once | INTRAVENOUS | Status: AC
  Filled 2020-11-13: qty 250

## 2020-11-13 MED ORDER — SODIUM CHLORIDE 0.9 % IV SOLN
5.0000 mg/kg | Freq: Once | INTRAVENOUS | Status: AC
  Administered 2020-11-13: 400 mg via INTRAVENOUS
  Filled 2020-11-13: qty 16

## 2020-11-13 MED ORDER — LEUCOVORIN CALCIUM INJECTION 350 MG
300.0000 mg/m2 | Freq: Once | INTRAVENOUS | Status: AC
  Administered 2020-11-13: 538 mg via INTRAVENOUS
  Filled 2020-11-13 (×2): qty 26.9

## 2020-11-13 MED ORDER — SODIUM CHLORIDE 0.9 % IV SOLN
1600.0000 mg/m2 | INTRAVENOUS | Status: DC
  Administered 2020-11-13: 2850 mg via INTRAVENOUS
  Filled 2020-11-13: qty 57

## 2020-11-13 MED ORDER — DEXTROSE 5 % IV SOLN
Freq: Once | INTRAVENOUS | Status: AC
  Filled 2020-11-13: qty 250

## 2020-11-13 NOTE — Progress Notes (Signed)
CRITICAL VALUE STICKER  CRITICAL VALUE: T. Bili: 3.8  RECEIVER (on-site recipient of call): Celesta Funderburk,RN   DATE & TIME NOTIFIED: 11/13/20 @ 1046  MESSENGER (representative from lab):Otila Kluver  MD NOTIFIED: Ned Card, NP  TIME OF NOTIFICATION: 3007  RESPONSE: Being seen in office. Prior bili + 3.4

## 2020-11-13 NOTE — Progress Notes (Addendum)
[place] OFFICE PROGRESS NOTE   Diagnosis: Colon cancer  INTERVAL HISTORY:   Rachel Frank returns as scheduled.  She continues to have right-sided abdominal pain.  She is taking pain medication as needed.  She has been having loose stools for the past week or so, estimates 5/day.  She is taking 1 Lomotil a day.  No nausea or vomiting.  She has mild numbness in the hands and feet.  Symptoms do not seem to interfere with activity.  Objective:  Vital signs in last 24 hours:  Blood pressure 131/84, pulse 97, temperature 97.8 F (36.6 C), temperature source Oral, resp. rate 18, height $RemoveBe'5\' 2"'uTaojbTpY$  (1.575 m), weight 159 lb 9.6 oz (72.4 kg), SpO2 99 %.    HEENT: Mild white coating over tongue.  Mild scleral icterus. Resp: Lungs clear bilaterally. Cardio: Regular rate and rhythm. GI: Liver palpable throughout the upper abdomen. Vascular: Trace edema lower leg bilaterally.  Port-A-Cath without erythema.  Lab Results:  Lab Results  Component Value Date   WBC 15.3 (H) 11/08/2020   HGB 10.5 (L) 11/08/2020   HCT 33.3 (L) 11/08/2020   MCV 84.1 11/08/2020   PLT 377 11/08/2020   NEUTROABS 10.7 (H) 11/08/2020    Imaging:  No results found.  Medications: I have reviewed the patient's current medications.  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 She presented with a large cecal mass, extensive hepatic metastatic disease; mild thoracic and abdominal adenopathy. She also has several small nonspecific pulmonary nodules. She was diagnosed in March 2021.   Colonoscopy 09/16/2019-fungating mass at the cecum, sessile polyps in the this sigmoid, transverse colon, and hepatic flexure, adenocarcinoma involving cecum biopsy, tubular adenoma and sessile serrated polyps  CTs chest, abdomen, and pelvis 10/08/2019-extensive liver metastases, thoracic/abdominal adenopathy, cecal mass, dilated and thin thickened appendix, nonspecific pulmonary nodules  Ultrasound-guided liver  biopsy 10/13/2019-adenocarcinoma consistent with a metastatic colorectal primary, MSS, tumor mutation burden 3, KRAS Q61,  Cycle 1 FOLFOX 10/14/2019  Cycle 2 FOLFOX 10/27/2019  Cycle 3 FOLFOX 11/11/2019, Emend and Decadron added, 5-FU dose escalated  Cycle 4 FOLFOX 11/25/2019, Udenyca added  Cycle 5 FOLFOX 12/08/2019  Cycle 6 FOLFOX 12/23/2019  CT 01/04/2020-decreased size of multiple hepatic metastases, slight decrease in bulk of cecal mass, stable ileocecal adenopathy  Cycle 7 FOLFOX 01/06/2020  Cycle 8 FOLFOX 01/31/2020  Cycle 9 FOLFOX 02/17/2020 (oxaliplatin dose reduced due to borderline ANC, persistent cold sensitivity)  Cycle 10 FOLFOX 03/02/2020  Cycle 11 FOLFOX 03/16/2020  Cycle 12 FOLFOX 03/30/2020 (oxaliplatin held secondary to neuropathy)  CT abdomen/pelvis 04/19/2020-no significant change in the abnormal soft tissue lesion in the region of the cecal tip involving the ileocecal valve and appendiceal orifice; slight interval progression of numerous hepatic metastases; similar appearance of small lymph nodes in the ileocolic mesentery with adjacent irregular focus of soft tissue also stable  Cycle 1 FOLFIRI/Avastin 04/25/2020  Cycle 2 FOLFIRI/Avastin 05/11/2020  Cycle 3 FOLFIRI 05/25/2020 (Avastin held due to proteinuria)  Cycle 4 FOLFIRI/Avastin 06/07/2020  Cycle 5 FOLFIRI/Avastin 06/22/2020  CTs 07/05/2020-mild improvement in multifocal liver metastases. Mass at the base of the cecum is stable. Persistent thickening of the appendix.  Cycle 6 FOLFIRI/Avastin 07/05/2020  Cycle 7 FOLFIRI/Avastin 07/20/2020  Cycle 8 FOLFIRI/Avastin 08/03/2020  Cycle 9 FOLFIRI 09/05/2020, Avastin held secondary to proteinuria  CT chest 09/05/2020-negative for pulmonary embolism, no acute disease, unchanged small pulmonary nodules  Cycle 10 FOLFIRI 09/20/2020, Avastin held secondary to proteinuria  Cycle 11 FOLFIRI 10/04/2020, Avastin held secondary to proteinuria  CTs 10/17/2020-increase in size  of liver metastases, stable cecal mass, slight decrease in size of right lower quadrant mesenteric lymph node  Cycle 12 FOLFIRI 10/18/2020, Avastin held secondary to proteinuria  Cycle 1 FOLFOX/Avastin 11/13/2020 2) Iron deficiency anemia secondary to #1. 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 Diarrhea 01/20/2020. C. difficile negative 7.  COVID-19 infection 08/13/2020, antibody infusion therapy on 08/19/2020 8.  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  Inflamed left labial cyst 09/29/2020-doxycycline   Disposition: Rachel Frank appears unchanged.  We reviewed that she is unable to begin Lonsurf due to the bilirubin level.  Dr. Benay Spice recommends FOLFOX/Avastin.  She has received FOLFOX and Avastin in the past.  We again reviewed potential toxicities.  She understands the potential for worsening of neuropathy related to oxaliplatin.  She is willing to accept this risk.  We discussed the previous proteinuria while on  Avastin.  Most recent 24-hour urine from 10/17/2020 showed total protein 770 mg.  I reviewed with the [place] pharmacist.  Avastin can be resumed when levels are less than 2 g.  Rachel Frank agrees to proceed with FOLFOX/Avastin today as scheduled.  Plan for repeat 24-hour urine in 4 weeks.  She will return for lab, follow-up, FOLFOX/Avastin in 2 weeks.  She will contact the office in the interim with any problems.  Patient seen with Dr. Benay Spice.    Ned Card ANP/GNP-BC   11/13/2020  10:16 AM This was a shared visit with Ned Card.  Rachel Frank appears unchanged.  There is persistent hyperbilirubinemia.  She cannot begin Lonsurf.  I communicated with Dr. Reynaldo Minium at the end of last week.  The plan  is to begin a trial of salvage FOLFOX/Avastin.  We reviewed potential toxicities associated with this regimen including the chance of worsening neuropathy and progressive proteinuria.  She agrees to proceed.  I was present for greater than 50% of today's visit.  I performed medical decision making.  Julieanne Manson, MD

## 2020-11-13 NOTE — Progress Notes (Signed)
Patient tolerated chemo well. Discharged ambulatory but wheeled out to the car by family member. Pump connected, connections checked and tightened, pump infusing.

## 2020-11-13 NOTE — Progress Notes (Signed)
Per Lisa,NP: okay to treat with current lab results.

## 2020-11-13 NOTE — Patient Instructions (Addendum)
[facility]    Discharge Instructions:  Thank you for choosing [facility] to provide your oncology and hematology care.   If you have a lab appointment with the [facility], please go directly to the [facility] and check in at the registration area.   Wear comfortable clothing and clothing appropriate for easy access to any Portacath or PICC line.   We strive to give you quality time with your provider. You may need to reschedule your appointment if you arrive late (15 or more minutes).  Arriving late affects you and other patients whose appointments are after yours.  Also, if you miss three or more appointments without notifying the office, you may be dismissed from the clinic at the provider's discretion.      For prescription refill requests, have your pharmacy contact our office and allow 72 hours for refills to be completed.    Today you received the following chemotherapy and/or immunotherapy agents Bevacizumab-bvzr (ZIRABEV), Oxaliplatin (ELOXATIN), Leucovorin, Flourouracil (ADRUCIL).     To help prevent nausea and vomiting after your treatment, we encourage you to take your nausea medication as directed.  BELOW ARE SYMPTOMS THAT SHOULD BE REPORTED IMMEDIATELY: . *FEVER GREATER THAN 100.4 F (38 C) OR HIGHER . *CHILLS OR SWEATING . *NAUSEA AND VOMITING THAT IS NOT CONTROLLED WITH YOUR NAUSEA MEDICATION . *UNUSUAL SHORTNESS OF BREATH . *UNUSUAL BRUISING OR BLEEDING . *URINARY PROBLEMS (pain or burning when urinating, or frequent urination) . *BOWEL PROBLEMS (unusual diarrhea, constipation, pain near the anus) . TENDERNESS IN MOUTH AND THROAT WITH OR WITHOUT PRESENCE OF ULCERS (sore throat, sores in mouth, or a toothache) . UNUSUAL RASH, SWELLING OR PAIN  . UNUSUAL VAGINAL DISCHARGE OR ITCHING   Items with * indicate a potential emergency and should be followed up as soon as possible or go to the Emergency Department if any problems  should occur.  Please show the CHEMOTHERAPY ALERT CARD or IMMUNOTHERAPY ALERT CARD at check-in to the Emergency Department and triage nurse.  Should you have questions after your visit or need to cancel or reschedule your appointment, please contact [facility]  Dept: [phone]  and follow the prompts.  Office hours are 8:00 a.m. to 4:30 p.m. Monday - Friday. Please note that voicemails left after 4:00 p.m. may not be returned until the following business day.  We are closed weekends and major holidays. You have access to a nurse at all times for urgent questions. Please call the main number to the clinic Dept: [phone] and follow the prompts.   For any non-urgent questions, you may also contact your provider using MyChart. We now offer e-Visits for anyone 25 and older to request care online for non-urgent symptoms. For details visit [email or web address].   Also download the MyChart app! Go to the app store, search "MyChart", open the app, select [facility], and log in with your MyChart username and password.  Due to Covid, a mask is required upon entering the hospital/clinic. If you do not have a mask, one will be given to you upon arrival. For doctor visits, patients may have 1 support person aged 67 or older with them. For treatment visits, patients cannot have anyone with them due to current Covid guidelines and our immunocompromised population.   Fluorouracil, 5-FU injection What is this medicine? FLUOROURACIL, 5-FU (flure oh YOOR a sil) is a chemotherapy drug. It slows the growth of cancer cells. This medicine is used to treat  many types of cancer like breast cancer, colon or rectal cancer, pancreatic cancer, and stomach cancer. This medicine may be used for other purposes; ask your health care provider or pharmacist if you have questions. COMMON BRAND NAME(S): Adrucil What should I tell my health care provider before I take this medicine? They need to  know if you have any of these conditions:  blood disorders  dihydropyrimidine dehydrogenase (DPD) deficiency  infection (especially a virus infection such as chickenpox, cold sores, or herpes)  kidney disease  liver disease  malnourished, poor nutrition  recent or ongoing radiation therapy  an unusual or allergic reaction to fluorouracil, other chemotherapy, other medicines, foods, dyes, or preservatives  pregnant or trying to get pregnant  breast-feeding How should I use this medicine? This drug is given as an infusion or injection into a vein. It is administered in a hospital or clinic by a specially trained health care professional. Talk to your pediatrician regarding the use of this medicine in children. Special care may be needed. Overdosage: If you think you have taken too much of this medicine contact a poison control center or emergency room at once. NOTE: This medicine is only for you. Do not share this medicine with others. What if I miss a dose? It is important not to miss your dose. Call your doctor or health care professional if you are unable to keep an appointment. What may interact with this medicine? Do not take this medicine with any of the following medications:  live virus vaccines This medicine may also interact with the following medications:  medicines that treat or prevent blood clots like warfarin, enoxaparin, and dalteparin This list may not describe all possible interactions. Give your health care provider a list of all the medicines, herbs, non-prescription drugs, or dietary supplements you use. Also tell them if you smoke, drink alcohol, or use illegal drugs. Some items may interact with your medicine. What should I watch for while using this medicine? Visit your doctor for checks on your progress. This drug may make you feel generally unwell. This is not uncommon, as chemotherapy can affect healthy cells as well as cancer cells. Report any side  effects. Continue your course of treatment even though you feel ill unless your doctor tells you to stop. In some cases, you may be given additional medicines to help with side effects. Follow all directions for their use. Call your doctor or health care professional for advice if you get a fever, chills or sore throat, or other symptoms of a cold or flu. Do not treat yourself. This drug decreases your body's ability to fight infections. Try to avoid being around people who are sick. This medicine may increase your risk to bruise or bleed. Call your doctor or health care professional if you notice any unusual bleeding. Be careful brushing and flossing your teeth or using a toothpick because you may get an infection or bleed more easily. If you have any dental work done, tell your dentist you are receiving this medicine. Avoid taking products that contain aspirin, acetaminophen, ibuprofen, naproxen, or ketoprofen unless instructed by your doctor. These medicines may hide a fever. Do not become pregnant while taking this medicine. Women should inform their doctor if they wish to become pregnant or think they might be pregnant. There is a potential for serious side effects to an unborn child. Talk to your health care professional or pharmacist for more information. Do not breast-feed an infant while taking this medicine. Men should  inform their doctor if they wish to father a child. This medicine may lower sperm counts. Do not treat diarrhea with over the counter products. Contact your doctor if you have diarrhea that lasts more than 2 days or if it is severe and watery. This medicine can make you more sensitive to the sun. Keep out of the sun. If you cannot avoid being in the sun, wear protective clothing and use sunscreen. Do not use sun lamps or tanning beds/booths. What side effects may I notice from receiving this medicine? Side effects that you should report to your doctor or health care professional  as soon as possible:  allergic reactions like skin rash, itching or hives, swelling of the face, lips, or tongue  low blood counts - this medicine may decrease the number of white blood cells, red blood cells and platelets. You may be at increased risk for infections and bleeding.  signs of infection - fever or chills, cough, sore throat, pain or difficulty passing urine  signs of decreased platelets or bleeding - bruising, pinpoint red spots on the skin, black, tarry stools, blood in the urine  signs of decreased red blood cells - unusually weak or tired, fainting spells, lightheadedness  breathing problems  changes in vision  chest pain  mouth sores  nausea and vomiting  pain, swelling, redness at site where injected  pain, tingling, numbness in the hands or feet  redness, swelling, or sores on hands or feet  stomach pain  unusual bleeding Side effects that usually do not require medical attention (report to your doctor or health care professional if they continue or are bothersome):  changes in finger or toe nails  diarrhea  dry or itchy skin  hair loss  headache  loss of appetite  sensitivity of eyes to the light  stomach upset  unusually teary eyes This list may not describe all possible side effects. Call your doctor for medical advice about side effects. You may report side effects to FDA at 1-800-FDA-1088. Where should I keep my medicine? This drug is given in a hospital or clinic and will not be stored at home. NOTE: This sheet is a summary. It may not cover all possible information. If you have questions about this medicine, talk to your doctor, pharmacist, or health care provider.  2021 Elsevier/Gold Standard (2019-06-01 15:00:03) Leucovorin injection What is this medicine? LEUCOVORIN (loo koe VOR in) is used to prevent or treat the harmful effects of some medicines. This medicine is used to treat anemia caused by a low amount of folic acid in the  body. It is also used with 5-fluorouracil (5-FU) to treat colon cancer. This medicine may be used for other purposes; ask your health care provider or pharmacist if you have questions. What should I tell my health care provider before I take this medicine? They need to know if you have any of these conditions:  anemia from low levels of vitamin B-12 in the blood  an unusual or allergic reaction to leucovorin, folic acid, other medicines, foods, dyes, or preservatives  pregnant or trying to get pregnant  breast-feeding How should I use this medicine? This medicine is for injection into a muscle or into a vein. It is given by a health care professional in a hospital or clinic setting. Talk to your pediatrician regarding the use of this medicine in children. Special care may be needed. Overdosage: If you think you have taken too much of this medicine contact a poison control center  or emergency room at once. NOTE: This medicine is only for you. Do not share this medicine with others. What if I miss a dose? This does not apply. What may interact with this medicine?  capecitabine  fluorouracil  phenobarbital  phenytoin  primidone  trimethoprim-sulfamethoxazole This list may not describe all possible interactions. Give your health care provider a list of all the medicines, herbs, non-prescription drugs, or dietary supplements you use. Also tell them if you smoke, drink alcohol, or use illegal drugs. Some items may interact with your medicine. What should I watch for while using this medicine? Your condition will be monitored carefully while you are receiving this medicine. This medicine may increase the side effects of 5-fluorouracil, 5-FU. Tell your doctor or health care professional if you have diarrhea or mouth sores that do not get better or that get worse. What side effects may I notice from receiving this medicine? Side effects that you should report to your doctor or health care  professional as soon as possible:  allergic reactions like skin rash, itching or hives, swelling of the face, lips, or tongue  breathing problems  fever, infection  mouth sores  unusual bleeding or bruising  unusually weak or tired Side effects that usually do not require medical attention (report to your doctor or health care professional if they continue or are bothersome):  constipation or diarrhea  loss of appetite  nausea, vomiting This list may not describe all possible side effects. Call your doctor for medical advice about side effects. You may report side effects to FDA at 1-800-FDA-1088. Where should I keep my medicine? This drug is given in a hospital or clinic and will not be stored at home. NOTE: This sheet is a summary. It may not cover all possible information. If you have questions about this medicine, talk to your doctor, pharmacist, or health care provider.  2021 Elsevier/Gold Standard (2008-01-05 16:50:29) Oxaliplatin Injection What is this medicine? OXALIPLATIN (ox AL i PLA tin) is a chemotherapy drug. It targets fast dividing cells, like cancer cells, and causes these cells to die. This medicine is used to treat cancers of the colon and rectum, and many other cancers. This medicine may be used for other purposes; ask your health care provider or pharmacist if you have questions. COMMON BRAND NAME(S): Eloxatin What should I tell my health care provider before I take this medicine? They need to know if you have any of these conditions:  heart disease  history of irregular heartbeat  liver disease  low blood counts, like white cells, platelets, or red blood cells  lung or breathing disease, like asthma  take medicines that treat or prevent blood clots  tingling of the fingers or toes, or other nerve disorder  an unusual or allergic reaction to oxaliplatin, other chemotherapy, other medicines, foods, dyes, or preservatives  pregnant or trying to get  pregnant  breast-feeding How should I use this medicine? This drug is given as an infusion into a vein. It is administered in a hospital or clinic by a specially trained health care professional. Talk to your pediatrician regarding the use of this medicine in children. Special care may be needed. Overdosage: If you think you have taken too much of this medicine contact a poison control center or emergency room at once. NOTE: This medicine is only for you. Do not share this medicine with others. What if I miss a dose? It is important not to miss a dose. Call your doctor or health  care professional if you are unable to keep an appointment. What may interact with this medicine? Do not take this medicine with any of the following medications:  cisapride  dronedarone  pimozide  thioridazine This medicine may also interact with the following medications:  aspirin and aspirin-like medicines  certain medicines that treat or prevent blood clots like warfarin, apixaban, dabigatran, and rivaroxaban  cisplatin  cyclosporine  diuretics  medicines for infection like acyclovir, adefovir, amphotericin B, bacitracin, cidofovir, foscarnet, ganciclovir, gentamicin, pentamidine, vancomycin  NSAIDs, medicines for pain and inflammation, like ibuprofen or naproxen  other medicines that prolong the QT interval (an abnormal heart rhythm)  pamidronate  zoledronic acid This list may not describe all possible interactions. Give your health care provider a list of all the medicines, herbs, non-prescription drugs, or dietary supplements you use. Also tell them if you smoke, drink alcohol, or use illegal drugs. Some items may interact with your medicine. What should I watch for while using this medicine? Your condition will be monitored carefully while you are receiving this medicine. You may need blood work done while you are taking this medicine. This medicine may make you feel generally unwell. This  is not uncommon as chemotherapy can affect healthy cells as well as cancer cells. Report any side effects. Continue your course of treatment even though you feel ill unless your healthcare professional tells you to stop. This medicine can make you more sensitive to cold. Do not drink cold drinks or use ice. Cover exposed skin before coming in contact with cold temperatures or cold objects. When out in cold weather wear warm clothing and cover your mouth and nose to warm the air that goes into your lungs. Tell your doctor if you get sensitive to the cold. Do not become pregnant while taking this medicine or for 9 months after stopping it. Women should inform their health care professional if they wish to become pregnant or think they might be pregnant. Men should not father a child while taking this medicine and for 6 months after stopping it. There is potential for serious side effects to an unborn child. Talk to your health care professional for more information. Do not breast-feed a child while taking this medicine or for 3 months after stopping it. This medicine has caused ovarian failure in some women. This medicine may make it more difficult to get pregnant. Talk to your health care professional if you are concerned about your fertility. This medicine has caused decreased sperm counts in some men. This may make it more difficult to father a child. Talk to your health care professional if you are concerned about your fertility. This medicine may increase your risk of getting an infection. Call your health care professional for advice if you get a fever, chills, or sore throat, or other symptoms of a cold or flu. Do not treat yourself. Try to avoid being around people who are sick. Avoid taking medicines that contain aspirin, acetaminophen, ibuprofen, naproxen, or ketoprofen unless instructed by your health care professional. These medicines may hide a fever. Be careful brushing or flossing your teeth or  using a toothpick because you may get an infection or bleed more easily. If you have any dental work done, tell your dentist you are receiving this medicine. What side effects may I notice from receiving this medicine? Side effects that you should report to your doctor or health care professional as soon as possible:  allergic reactions like skin rash, itching or hives, swelling of  the face, lips, or tongue  breathing problems  cough  low blood counts - this medicine may decrease the number of white blood cells, red blood cells, and platelets. You may be at increased risk for infections and bleeding  nausea, vomiting  pain, redness, or irritation at site where injected  pain, tingling, numbness in the hands or feet  signs and symptoms of bleeding such as bloody or black, tarry stools; red or dark brown urine; spitting up blood or brown material that looks like coffee grounds; red spots on the skin; unusual bruising or bleeding from the eyes, gums, or nose  signs and symptoms of a dangerous change in heartbeat or heart rhythm like chest pain; dizziness; fast, irregular heartbeat; palpitations; feeling faint or lightheaded; falls  signs and symptoms of infection like fever; chills; cough; sore throat; pain or trouble passing urine  signs and symptoms of liver injury like dark yellow or brown urine; general ill feeling or flu-like symptoms; light-colored stools; loss of appetite; nausea; right upper belly pain; unusually weak or tired; yellowing of the eyes or skin  signs and symptoms of low red blood cells or anemia such as unusually weak or tired; feeling faint or lightheaded; falls  signs and symptoms of muscle injury like dark urine; trouble passing urine or change in the amount of urine; unusually weak or tired; muscle pain; back pain Side effects that usually do not require medical attention (report to your doctor or health care professional if they continue or are  bothersome):  changes in taste  diarrhea  gas  hair loss  loss of appetite  mouth sores This list may not describe all possible side effects. Call your doctor for medical advice about side effects. You may report side effects to FDA at 1-800-FDA-1088. Where should I keep my medicine? This drug is given in a hospital or clinic and will not be stored at home. NOTE: This sheet is a summary. It may not cover all possible information. If you have questions about this medicine, talk to your doctor, pharmacist, or health care provider.  2021 Elsevier/Gold Standard (2018-11-18 12:20:35)

## 2020-11-14 ENCOUNTER — Telehealth: Payer: Self-pay | Admitting: *Deleted

## 2020-11-14 ENCOUNTER — Inpatient Hospital Stay: Payer: 59

## 2020-11-14 DIAGNOSIS — Z5112 Encounter for antineoplastic immunotherapy: Secondary | ICD-10-CM | POA: Diagnosis not present

## 2020-11-14 NOTE — Telephone Encounter (Signed)
Called to report she does not have the collection container for her 24 hour urine MD ordered. She will have her husband come to p/u collection materials and start asap so she can bring specimen in tomorrow at time of pump d/c.

## 2020-11-15 ENCOUNTER — Other Ambulatory Visit: Payer: Self-pay

## 2020-11-15 ENCOUNTER — Inpatient Hospital Stay: Payer: 59

## 2020-11-15 VITALS — BP 148/94 | HR 80 | Temp 97.8°F | Resp 17

## 2020-11-15 DIAGNOSIS — Z5112 Encounter for antineoplastic immunotherapy: Secondary | ICD-10-CM | POA: Diagnosis not present

## 2020-11-15 DIAGNOSIS — C19 Malignant neoplasm of rectosigmoid junction: Secondary | ICD-10-CM

## 2020-11-15 LAB — PROTEIN, URINE, 24 HOUR
Collection Interval-UPROT: 24 hours
Protein, 24H Urine: 713 mg/d — ABNORMAL HIGH (ref 50–100)
Protein, Urine: 31 mg/dL
Urine Total Volume-UPROT: 2300 mL

## 2020-11-15 MED ORDER — PEGFILGRASTIM-CBQV 6 MG/0.6ML ~~LOC~~ SOSY
6.0000 mg | PREFILLED_SYRINGE | Freq: Once | SUBCUTANEOUS | Status: AC
  Administered 2020-11-15: 6 mg via SUBCUTANEOUS

## 2020-11-15 MED ORDER — SODIUM CHLORIDE 0.9% FLUSH
10.0000 mL | INTRAVENOUS | Status: DC | PRN
  Administered 2020-11-15: 10 mL
  Filled 2020-11-15: qty 10

## 2020-11-15 MED ORDER — HEPARIN SOD (PORK) LOCK FLUSH 100 UNIT/ML IV SOLN
500.0000 [IU] | Freq: Once | INTRAVENOUS | Status: AC | PRN
Start: 2020-11-15 — End: 2020-11-15
  Administered 2020-11-15: 500 [IU]
  Filled 2020-11-15: qty 5

## 2020-11-15 NOTE — Patient Instructions (Signed)
[facility]    Discharge Instructions:  Thank you for choosing [facility] to provide your oncology and hematology care.   If you have a lab appointment with the Agency, please go directly to the [facility] and check in at the registration area.   Wear comfortable clothing and clothing appropriate for easy access to any Portacath or PICC line.   We strive to give you quality time with your provider. You may need to reschedule your appointment if you arrive late (15 or more minutes).  Arriving late affects you and other patients whose appointments are after yours.  Also, if you miss three or more appointments without notifying the office, you may be dismissed from the clinic at the provider's discretion.      For prescription refill requests, have your pharmacy contact our office and allow 72 hours for refills to be completed.    Today you received the following chemotherapy and/or immunotherapy agents Udenyca Pegfilgrastim-cbqv   To help prevent nausea and vomiting after your treatment, we encourage you to take your nausea medication as directed.  BELOW ARE SYMPTOMS THAT SHOULD BE REPORTED IMMEDIATELY: . *FEVER GREATER THAN 100.4 F (38 C) OR HIGHER . *CHILLS OR SWEATING . *NAUSEA AND VOMITING THAT IS NOT CONTROLLED WITH YOUR NAUSEA MEDICATION . *UNUSUAL SHORTNESS OF BREATH . *UNUSUAL BRUISING OR BLEEDING . *URINARY PROBLEMS (pain or burning when urinating, or frequent urination) . *BOWEL PROBLEMS (unusual diarrhea, constipation, pain near the anus) . TENDERNESS IN MOUTH AND THROAT WITH OR WITHOUT PRESENCE OF ULCERS (sore throat, sores in mouth, or a toothache) . UNUSUAL RASH, SWELLING OR PAIN  . UNUSUAL VAGINAL DISCHARGE OR ITCHING   Items with * indicate a potential emergency and should be followed up as soon as possible or go to the Emergency Department if any problems should occur.  Please show the CHEMOTHERAPY ALERT CARD or  IMMUNOTHERAPY ALERT CARD at check-in to the Emergency Department and triage nurse.  Should you have questions after your visit or need to cancel or reschedule your appointment, please contact [facility]  Dept: [phone]  and follow the prompts.  Office hours are 8:00 a.m. to 4:30 p.m. Monday - Friday. Please note that voicemails left after 4:00 p.m. may not be returned until the following business day.  We are closed weekends and major holidays. You have access to a nurse at all times for urgent questions. Please call the main number to the clinic Dept: [phone] and follow the prompts.   For any non-urgent questions, you may also contact your provider using MyChart. We now offer e-Visits for anyone 1 and older to request care online for non-urgent symptoms. For details visit [email or web address].   Also download the MyChart app! Go to the app store, search "MyChart", open the app, select [facility], and log in with your MyChart username and password.  Due to Covid, a mask is required upon entering the hospital/clinic. If you do not have a mask, one will be given to you upon arrival. For doctor visits, patients may have 1 support person aged 43 or older with them. For treatment visits, patients cannot have anyone with them due to current Covid guidelines and our immunocompromised population.

## 2020-11-26 ENCOUNTER — Other Ambulatory Visit: Payer: Self-pay | Admitting: Oncology

## 2020-11-27 ENCOUNTER — Inpatient Hospital Stay: Payer: 59

## 2020-11-27 ENCOUNTER — Other Ambulatory Visit: Payer: Self-pay | Admitting: *Deleted

## 2020-11-27 ENCOUNTER — Telehealth: Payer: Self-pay

## 2020-11-27 ENCOUNTER — Other Ambulatory Visit: Payer: Self-pay

## 2020-11-27 ENCOUNTER — Inpatient Hospital Stay (HOSPITAL_BASED_OUTPATIENT_CLINIC_OR_DEPARTMENT_OTHER): Payer: 59 | Admitting: Oncology

## 2020-11-27 VITALS — BP 150/96 | HR 88 | Temp 97.7°F | Resp 19 | Ht 62.0 in | Wt 151.4 lb

## 2020-11-27 DIAGNOSIS — C19 Malignant neoplasm of rectosigmoid junction: Secondary | ICD-10-CM

## 2020-11-27 DIAGNOSIS — Z5112 Encounter for antineoplastic immunotherapy: Secondary | ICD-10-CM | POA: Diagnosis not present

## 2020-11-27 LAB — CBC WITH DIFFERENTIAL (CANCER CENTER ONLY)
Abs Immature Granulocytes: 0.03 10*3/uL (ref 0.00–0.07)
Basophils Absolute: 0.1 10*3/uL (ref 0.0–0.1)
Basophils Relative: 1 %
Eosinophils Absolute: 0.1 10*3/uL (ref 0.0–0.5)
Eosinophils Relative: 1 %
HCT: 37 % (ref 36.0–46.0)
Hemoglobin: 11.6 g/dL — ABNORMAL LOW (ref 12.0–15.0)
Immature Granulocytes: 0 %
Lymphocytes Relative: 22 %
Lymphs Abs: 1.8 10*3/uL (ref 0.7–4.0)
MCH: 26.4 pg (ref 26.0–34.0)
MCHC: 31.4 g/dL (ref 30.0–36.0)
MCV: 84.3 fL (ref 80.0–100.0)
Monocytes Absolute: 0.7 10*3/uL (ref 0.1–1.0)
Monocytes Relative: 9 %
Neutro Abs: 5.7 10*3/uL (ref 1.7–7.7)
Neutrophils Relative %: 67 %
Platelet Count: 269 10*3/uL (ref 150–400)
RBC: 4.39 MIL/uL (ref 3.87–5.11)
RDW: 20 % — ABNORMAL HIGH (ref 11.5–15.5)
WBC Count: 8.4 10*3/uL (ref 4.0–10.5)
nRBC: 0 % (ref 0.0–0.2)

## 2020-11-27 LAB — CMP (CANCER CENTER ONLY)
ALT: 54 U/L — ABNORMAL HIGH (ref 0–44)
AST: 97 U/L — ABNORMAL HIGH (ref 15–41)
Albumin: 2.9 g/dL — ABNORMAL LOW (ref 3.5–5.0)
Alkaline Phosphatase: 1405 U/L — ABNORMAL HIGH (ref 38–126)
Anion gap: 11 (ref 5–15)
BUN: 10 mg/dL (ref 8–23)
CO2: 26 mmol/L (ref 22–32)
Calcium: 8.3 mg/dL — ABNORMAL LOW (ref 8.9–10.3)
Chloride: 98 mmol/L (ref 98–111)
Creatinine: 0.32 mg/dL — ABNORMAL LOW (ref 0.44–1.00)
GFR, Estimated: >60 mL/min (ref >60)
Glucose, Bld: 147 mg/dL — ABNORMAL HIGH (ref 70–99)
Potassium: 3.3 mmol/L — ABNORMAL LOW (ref 3.5–5.1)
Sodium: 135 mmol/L (ref 135–145)
Total Bilirubin: 1.8 mg/dL — ABNORMAL HIGH (ref 0.3–1.2)
Total Protein: 6.4 g/dL — ABNORMAL LOW (ref 6.5–8.1)

## 2020-11-27 LAB — MAGNESIUM: Magnesium: 1.6 mg/dL — ABNORMAL LOW (ref 1.7–2.4)

## 2020-11-27 MED ORDER — SODIUM CHLORIDE 0.9 % IV SOLN
Freq: Once | INTRAVENOUS | Status: AC
  Filled 2020-11-27: qty 250

## 2020-11-27 MED ORDER — LEUCOVORIN CALCIUM INJECTION 350 MG
300.0000 mg/m2 | Freq: Once | INTRAVENOUS | Status: AC
  Administered 2020-11-27: 520 mg via INTRAVENOUS
  Filled 2020-11-27: qty 26

## 2020-11-27 MED ORDER — SODIUM CHLORIDE 0.9 % IV SOLN
10.0000 mg | Freq: Once | INTRAVENOUS | Status: AC
  Administered 2020-11-27: 10 mg via INTRAVENOUS
  Filled 2020-11-27: qty 1

## 2020-11-27 MED ORDER — DEXTROSE 5 % IV SOLN
Freq: Once | INTRAVENOUS | Status: AC
Start: 2020-11-27 — End: 2020-11-27
  Filled 2020-11-27: qty 250

## 2020-11-27 MED ORDER — DEXTROSE 5 % IV SOLN
Freq: Once | INTRAVENOUS | Status: AC
  Filled 2020-11-27: qty 250

## 2020-11-27 MED ORDER — SODIUM CHLORIDE 0.9 % IV SOLN
5.0000 mg/kg | Freq: Once | INTRAVENOUS | Status: AC
  Administered 2020-11-27: 400 mg via INTRAVENOUS
  Filled 2020-11-27: qty 16

## 2020-11-27 MED ORDER — OXALIPLATIN CHEMO INJECTION 100 MG/20ML
65.0000 mg/m2 | Freq: Once | INTRAVENOUS | Status: AC
  Administered 2020-11-27: 110 mg via INTRAVENOUS
  Filled 2020-11-27: qty 22

## 2020-11-27 MED ORDER — SODIUM CHLORIDE 0.9 % IV SOLN
150.0000 mg | Freq: Once | INTRAVENOUS | Status: AC
  Administered 2020-11-27: 150 mg via INTRAVENOUS
  Filled 2020-11-27: qty 5

## 2020-11-27 MED ORDER — SODIUM CHLORIDE 0.9 % IV SOLN
1600.0000 mg/m2 | INTRAVENOUS | Status: DC
  Administered 2020-11-27: 2750 mg via INTRAVENOUS
  Filled 2020-11-27: qty 55

## 2020-11-27 MED ORDER — MAGNESIUM SULFATE 2 GM/50ML IV SOLN: 2.0000 g | Freq: Once | INTRAVENOUS | Status: DC

## 2020-11-27 MED ORDER — PALONOSETRON HCL INJECTION 0.25 MG/5ML
0.2500 mg | Freq: Once | INTRAVENOUS | Status: AC
  Administered 2020-11-27: 0.25 mg via INTRAVENOUS
  Filled 2020-11-27: qty 5

## 2020-11-27 NOTE — Addendum Note (Signed)
Addended by: Betsy Coder B on: 11/27/2020 11:35 AM   Modules accepted: Orders

## 2020-11-27 NOTE — Patient Instructions (Signed)
[facility]  Discharge Instructions: Thank you for choosing [facility] to provide your oncology and hematology care.   If you have a lab appointment with the [facility], please go directly to the [facility] and check in at the registration area.   Wear comfortable clothing and clothing appropriate for easy access to any Portacath or PICC line.   We strive to give you quality time with your provider. You may need to reschedule your appointment if you arrive late (15 or more minutes).  Arriving late affects you and other patients whose appointments are after yours.  Also, if you miss three or more appointments without notifying the office, you may be dismissed from the clinic at the provider's discretion.      For prescription refill requests, have your pharmacy contact our office and allow 72 hours for refills to be completed.    Today you received the following chemotherapy and/or immunotherapy agents Avastin, Leucovorin, Oxaliplatin, and 5FU      To help prevent nausea and vomiting after your treatment, we encourage you to take your nausea medication as directed.  BELOW ARE SYMPTOMS THAT SHOULD BE REPORTED IMMEDIATELY: . *FEVER GREATER THAN 100.4 F (38 C) OR HIGHER . *CHILLS OR SWEATING . *NAUSEA AND VOMITING THAT IS NOT CONTROLLED WITH YOUR NAUSEA MEDICATION . *UNUSUAL SHORTNESS OF BREATH . *UNUSUAL BRUISING OR BLEEDING . *URINARY PROBLEMS (pain or burning when urinating, or frequent urination) . *BOWEL PROBLEMS (unusual diarrhea, constipation, pain near the anus) . TENDERNESS IN MOUTH AND THROAT WITH OR WITHOUT PRESENCE OF ULCERS (sore throat, sores in mouth, or a toothache) . UNUSUAL RASH, SWELLING OR PAIN  . UNUSUAL VAGINAL DISCHARGE OR ITCHING   Items with * indicate a potential emergency and should be followed up as soon as possible or go to the Emergency Department if any problems should occur.  Please show the CHEMOTHERAPY ALERT  CARD or IMMUNOTHERAPY ALERT CARD at check-in to the Emergency Department and triage nurse.  Should you have questions after your visit or need to cancel or reschedule your appointment, please contact [facility]  Dept: [phone]  and follow the prompts.  Office hours are 8:00 a.m. to 4:30 p.m. Monday - Friday. Please note that voicemails left after 4:00 p.m. may not be returned until the following business day.  We are closed weekends and major holidays. You have access to a nurse at all times for urgent questions. Please call the main number to the clinic Dept: [phone] and follow the prompts.   For any non-urgent questions, you may also contact your provider using MyChart. We now offer e-Visits for anyone 52 and older to request care online for non-urgent symptoms. For details visit [email or web address].   Also download the MyChart app! Go to the app store, search "MyChart", open the app, select [facility], and log in with your MyChart username and password.  Due to Covid, a mask is required upon entering the hospital/clinic. If you do not have a mask, one will be given to you upon arrival. For doctor visits, patients may have 1 support person aged 42 or older with them. For treatment visits, patients cannot have anyone with them due to current Covid guidelines and our immunocompromised population.

## 2020-11-27 NOTE — Telephone Encounter (Signed)
-----   Message from Ladell Pier, MD sent at 11/27/2020 12:58 PM EDT ----- Please give 2 grams magnesium sulfate with chemotherapy on 11/28/2020

## 2020-11-27 NOTE — Progress Notes (Signed)
[facility] OFFICE PROGRESS NOTE   Diagnosis: Colon cancer  INTERVAL HISTORY:   Rachel Frank completed a cycle of FOLFOX Avastin on 11/13/2020.  No nausea/vomiting or diarrhea.  Abdominal pain has improved.  She has not taken a pain pill for the past week.  She has increased pain today.  She has tingling in the feet.  No symptom of allergic reaction.  Good appetite.  Leg edema has improved.  She is taking a potassium supplement.  Objective:  Vital signs in last 24 hours:  Blood pressure (!) 150/96, pulse 88, temperature 97.7 F (36.5 C), temperature source Oral, resp. rate 19, height $RemoveBe'5\' 2"'TVpIZLrHJ$  (1.575 m), weight 151 lb 6.4 oz (68.7 kg), SpO2 99 %.    HEENT: No thrush or ulcers Resp: Lungs clear bilaterally Cardio: Regular rate and rhythm GI: The liver is palpable throughout the upper abdomen with associated tenderness Vascular: No leg edema  Skin: Hyperpigmentation of the hands  Portacath/PICC-without erythema  Lab Results:  Lab Results  Component Value Date   WBC 8.4 11/27/2020   HGB 11.6 (L) 11/27/2020   HCT 37.0 11/27/2020   MCV 84.3 11/27/2020   PLT 269 11/27/2020   NEUTROABS 5.7 11/27/2020    CMP  Lab Results  Component Value Date   NA 135 11/27/2020   K 3.3 (L) 11/27/2020   CL 98 11/27/2020   CO2 26 11/27/2020   GLUCOSE 147 (H) 11/27/2020   BUN 10 11/27/2020   CREATININE 0.32 (L) 11/27/2020   CALCIUM 8.3 (L) 11/27/2020   PROT 6.4 (L) 11/27/2020   ALBUMIN 2.9 (L) 11/27/2020   AST 97 (H) 11/27/2020   ALT 54 (H) 11/27/2020   ALKPHOS 1,405 (H) 11/27/2020   BILITOT 1.8 (H) 11/27/2020   GFRNONAA >60 11/27/2020   GFRAA >60 03/30/2020    Lab Results  Component Value Date   CEA1 94,765.46 (H) 11/08/2020    Medications: I have reviewed the patient's current medications.   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 She presented with a large cecal mass, extensive hepatic metastatic disease; mild thoracic and abdominal adenopathy.  She also has several small nonspecific pulmonary nodules. She was diagnosed in March 2021.   Colonoscopy 09/16/2019-fungating mass at the cecum, sessile polyps in the this sigmoid, transverse colon, and hepatic flexure, adenocarcinoma involving cecum biopsy, tubular adenoma and sessile serrated polyps  CTs chest, abdomen, and pelvis 10/08/2019-extensive liver metastases, thoracic/abdominal adenopathy, cecal mass, dilated and thin thickened appendix, nonspecific pulmonary nodules  Ultrasound-guided liver biopsy 10/13/2019-adenocarcinoma consistent with a metastatic colorectal primary, MSS, tumor mutation burden 3, KRAS Q61,  Cycle 1 FOLFOX 10/14/2019  Cycle 2 FOLFOX 10/27/2019  Cycle 3 FOLFOX 11/11/2019, Emend and Decadron added, 5-FU dose escalated  Cycle 4 FOLFOX 11/25/2019, Udenyca added  Cycle 5 FOLFOX 12/08/2019  Cycle 6 FOLFOX 12/23/2019  CT 01/04/2020-decreased size of multiple hepatic metastases, slight decrease in bulk of cecal mass, stable ileocecal adenopathy  Cycle 7 FOLFOX 01/06/2020  Cycle 8 FOLFOX 01/31/2020  Cycle 9 FOLFOX 02/17/2020 (oxaliplatin dose reduced due to borderline ANC, persistent cold sensitivity)  Cycle 10 FOLFOX 03/02/2020  Cycle 11 FOLFOX 03/16/2020  Cycle 12 FOLFOX 03/30/2020 (oxaliplatin held secondary to neuropathy)  CT abdomen/pelvis 04/19/2020-no significant change in the abnormal soft tissue lesion in the region of the cecal tip involving the ileocecal valve and appendiceal orifice; slight interval progression of numerous hepatic metastases; similar appearance of small lymph nodes in the ileocolic mesentery with adjacent irregular focus of soft tissue also stable  Cycle  1 FOLFIRI/Avastin 04/25/2020  Cycle 2 FOLFIRI/Avastin 05/11/2020  Cycle 3 FOLFIRI 05/25/2020 (Avastin held due to proteinuria)  Cycle 4 FOLFIRI/Avastin 06/07/2020  Cycle 5 FOLFIRI/Avastin 06/22/2020  CTs 07/05/2020-mild improvement in multifocal liver metastases. Mass at the base of the  cecum is stable. Persistent thickening of the appendix.  Cycle 6 FOLFIRI/Avastin 07/05/2020  Cycle 7 FOLFIRI/Avastin 07/20/2020  Cycle 8 FOLFIRI/Avastin 08/03/2020  Cycle 9 FOLFIRI 09/05/2020, Avastin held secondary to proteinuria  CT chest 09/05/2020-negative for pulmonary embolism, no acute disease, unchanged small pulmonary nodules  Cycle 10 FOLFIRI 09/20/2020, Avastin held secondary to proteinuria  Cycle 11 FOLFIRI 10/04/2020, Avastin held secondary to proteinuria  CTs 10/17/2020-increase in size of liver metastases, stable cecal mass, slight decrease in size of right lower quadrant mesenteric lymph node  Cycle 12 FOLFIRI 10/18/2020, Avastin held secondary to proteinuria  Cycle 1 FOLFOX/Avastin 11/13/2020  Cycle 2 FOLFOX/Avastin 11/27/2020 2) Iron deficiency anemia secondary to #1. 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 Diarrhea 01/20/2020. C. difficile negative 7.  COVID-19 infection 08/13/2020, antibody infusion therapy on 08/19/2020 8.  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  Inflamed left labial cyst 09/29/2020-doxycycline     Disposition: Rachel Frank has an improved performance status compared to when she was here 2 weeks ago.  She tolerated the FOLFOX/Avastin well.  She will complete another cycle today.  We will check a magnesium level today.  She will continue a potassium supplement.  Rachel Frank will return for an office visit and chemotherapy in 2 weeks.  We will check a 24-hour urine protein when she returns in 2 weeks  Betsy Coder, MD  11/27/2020  11:06 AM

## 2020-11-27 NOTE — Progress Notes (Signed)
Ok to keep Avastin dose 400 mg for today per MD.  Roselind Messier, PharmD

## 2020-11-27 NOTE — Progress Notes (Signed)
Rachel Frank presents today for D1C2 Avastin and FOLFOX. Pt denies any new changes or symptoms since last treatment. Her neuropathy in her feet and hands is unchanged. She reports being fatigued after too much activity so she tries to limit herself. Lab results and vitals have been reviewed and are stable for treatment. Alk Phos of 1405, bili 1.8, AST 97 have all been dually noted by myself and Dr. Benay Spice. Patient has been assessed by Dr. Benay Spice who has approved proceeding with treatment today as planned.  Infusions tolerated without incident or complaint. VSS upon completion of treatment. Port flushed per protocol and 5FU infusing via home infusion pump with RUN noted on screen, see MAR and IV flowsheet for details. Discharged in satisfactory condition with follow up instructions.

## 2020-11-27 NOTE — Patient Instructions (Signed)

## 2020-11-27 NOTE — Telephone Encounter (Signed)
TC to Pt about magnesium level (1.6) Pt aware, Informed Pt when she comes in for pump D/C she will receive magnesium infusion. Pt verbalize understanding No further problems or concerns noted.

## 2020-11-29 ENCOUNTER — Inpatient Hospital Stay: Payer: 59

## 2020-11-29 ENCOUNTER — Other Ambulatory Visit: Payer: Self-pay

## 2020-11-29 DIAGNOSIS — C19 Malignant neoplasm of rectosigmoid junction: Secondary | ICD-10-CM

## 2020-11-29 DIAGNOSIS — Z5112 Encounter for antineoplastic immunotherapy: Secondary | ICD-10-CM | POA: Diagnosis not present

## 2020-11-29 MED ORDER — SODIUM CHLORIDE 0.9% FLUSH
10.0000 mL | INTRAVENOUS | Status: DC | PRN
  Administered 2020-11-29: 10 mL
  Filled 2020-11-29: qty 10

## 2020-11-29 MED ORDER — HEPARIN SOD (PORK) LOCK FLUSH 100 UNIT/ML IV SOLN
500.0000 [IU] | Freq: Once | INTRAVENOUS | Status: AC | PRN
Start: 2020-11-29 — End: 2020-11-29
  Administered 2020-11-29: 500 [IU]
  Filled 2020-11-29: qty 5

## 2020-11-29 MED ORDER — SODIUM CHLORIDE 0.9 % IV SOLN
INTRAVENOUS | Status: DC
  Filled 2020-11-29: qty 250

## 2020-11-29 MED ORDER — PEGFILGRASTIM-CBQV 6 MG/0.6ML ~~LOC~~ SOSY
6.0000 mg | PREFILLED_SYRINGE | Freq: Once | SUBCUTANEOUS | Status: AC
  Administered 2020-11-29: 6 mg via SUBCUTANEOUS
  Filled 2020-11-29: qty 0.6

## 2020-11-29 MED ORDER — MAGNESIUM SULFATE 2 GM/50ML IV SOLN
2.0000 g | Freq: Once | INTRAVENOUS | Status: AC
Start: 2020-11-29 — End: 2020-11-29
  Administered 2020-11-29: 2 g via INTRAVENOUS
  Filled 2020-11-29: qty 50

## 2020-11-29 NOTE — Patient Instructions (Signed)
Pegfilgrastim injection What is this medicine? PEGFILGRASTIM (PEG fil gra stim) is a long-acting granulocyte colony-stimulating factor that stimulates the growth of neutrophils, a type of white blood cell important in the body's fight against infection. It is used to reduce the incidence of fever and infection in patients with certain types of cancer who are receiving chemotherapy that affects the bone marrow, and to increase survival after being exposed to high doses of radiation. This medicine may be used for other purposes; ask your health care provider or pharmacist if you have questions. COMMON BRAND NAME(S): Fulphila, Neulasta, Nyvepria, UDENYCA, Ziextenzo What should I tell my health care provider before I take this medicine? They need to know if you have any of these conditions:  kidney disease  latex allergy  ongoing radiation therapy  sickle cell disease  skin reactions to acrylic adhesives (On-Body Injector only)  an unusual or allergic reaction to pegfilgrastim, filgrastim, other medicines, foods, dyes, or preservatives  pregnant or trying to get pregnant  breast-feeding How should I use this medicine? This medicine is for injection under the skin. If you get this medicine at home, you will be taught how to prepare and give the pre-filled syringe or how to use the On-body Injector. Refer to the patient Instructions for Use for detailed instructions. Use exactly as directed. Tell your healthcare provider immediately if you suspect that the On-body Injector may not have performed as intended or if you suspect the use of the On-body Injector resulted in a missed or partial dose. It is important that you put your used needles and syringes in a special sharps container. Do not put them in a trash can. If you do not have a sharps container, call your pharmacist or healthcare provider to get one. Talk to your pediatrician regarding the use of this medicine in children. While this drug  may be prescribed for selected conditions, precautions do apply. Overdosage: If you think you have taken too much of this medicine contact a poison control center or emergency room at once. NOTE: This medicine is only for you. Do not share this medicine with others. What if I miss a dose? It is important not to miss your dose. Call your doctor or health care professional if you miss your dose. If you miss a dose due to an On-body Injector failure or leakage, a new dose should be administered as soon as possible using a single prefilled syringe for manual use. What may interact with this medicine? Interactions have not been studied. This list may not describe all possible interactions. Give your health care provider a list of all the medicines, herbs, non-prescription drugs, or dietary supplements you use. Also tell them if you smoke, drink alcohol, or use illegal drugs. Some items may interact with your medicine. What should I watch for while using this medicine? Your condition will be monitored carefully while you are receiving this medicine. You may need blood work done while you are taking this medicine. Talk to your health care provider about your risk of cancer. You may be more at risk for certain types of cancer if you take this medicine. If you are going to need a MRI, CT scan, or other procedure, tell your doctor that you are using this medicine (On-Body Injector only). What side effects may I notice from receiving this medicine? Side effects that you should report to your doctor or health care professional as soon as possible:  allergic reactions (skin rash, itching or hives, swelling of   the face, lips, or tongue)  back pain  dizziness  fever  pain, redness, or irritation at site where injected  pinpoint red spots on the skin  red or dark-brown urine  shortness of breath or breathing problems  stomach or side pain, or pain at the shoulder  swelling  tiredness  trouble  passing urine or change in the amount of urine  unusual bruising or bleeding Side effects that usually do not require medical attention (report to your doctor or health care professional if they continue or are bothersome):  bone pain  muscle pain This list may not describe all possible side effects. Call your doctor for medical advice about side effects. You may report side effects to FDA at 1-800-FDA-1088. Where should I keep my medicine? Keep out of the reach of children. If you are using this medicine at home, you will be instructed on how to store it. Throw away any unused medicine after the expiration date on the label. NOTE: This sheet is a summary. It may not cover all possible information. If you have questions about this medicine, talk to your doctor, pharmacist, or health care provider.  2021 Elsevier/Gold Standard (2019-07-23 13:20:51)  

## 2020-12-10 ENCOUNTER — Other Ambulatory Visit: Payer: Self-pay | Admitting: Oncology

## 2020-12-12 ENCOUNTER — Inpatient Hospital Stay: Payer: 59

## 2020-12-12 ENCOUNTER — Other Ambulatory Visit: Payer: Self-pay

## 2020-12-12 ENCOUNTER — Inpatient Hospital Stay (HOSPITAL_BASED_OUTPATIENT_CLINIC_OR_DEPARTMENT_OTHER): Payer: 59 | Admitting: Nurse Practitioner

## 2020-12-12 ENCOUNTER — Encounter: Payer: Self-pay | Admitting: Nurse Practitioner

## 2020-12-12 VITALS — BP 150/99 | HR 88 | Temp 97.7°F | Resp 18 | Ht 62.0 in | Wt 149.6 lb

## 2020-12-12 DIAGNOSIS — Z5112 Encounter for antineoplastic immunotherapy: Secondary | ICD-10-CM | POA: Diagnosis not present

## 2020-12-12 DIAGNOSIS — C19 Malignant neoplasm of rectosigmoid junction: Secondary | ICD-10-CM

## 2020-12-12 LAB — CBC WITH DIFFERENTIAL (CANCER CENTER ONLY)
Abs Immature Granulocytes: 0.02 10*3/uL (ref 0.00–0.07)
Basophils Absolute: 0.1 10*3/uL (ref 0.0–0.1)
Basophils Relative: 1 %
Eosinophils Absolute: 0.1 10*3/uL (ref 0.0–0.5)
Eosinophils Relative: 1 %
HCT: 37 % (ref 36.0–46.0)
Hemoglobin: 11.9 g/dL — ABNORMAL LOW (ref 12.0–15.0)
Immature Granulocytes: 0 %
Lymphocytes Relative: 24 %
Lymphs Abs: 1.9 10*3/uL (ref 0.7–4.0)
MCH: 26.8 pg (ref 26.0–34.0)
MCHC: 32.2 g/dL (ref 30.0–36.0)
MCV: 83.3 fL (ref 80.0–100.0)
Monocytes Absolute: 0.6 10*3/uL (ref 0.1–1.0)
Monocytes Relative: 8 %
Neutro Abs: 5.2 10*3/uL (ref 1.7–7.7)
Neutrophils Relative %: 66 %
Platelet Count: 190 10*3/uL (ref 150–400)
RBC: 4.44 MIL/uL (ref 3.87–5.11)
RDW: 19.3 % — ABNORMAL HIGH (ref 11.5–15.5)
WBC Count: 7.9 10*3/uL (ref 4.0–10.5)
nRBC: 0 % (ref 0.0–0.2)

## 2020-12-12 LAB — CMP (CANCER CENTER ONLY)
ALT: 47 U/L — ABNORMAL HIGH (ref 0–44)
AST: 80 U/L — ABNORMAL HIGH (ref 15–41)
Albumin: 2.7 g/dL — ABNORMAL LOW (ref 3.5–5.0)
Alkaline Phosphatase: 1167 U/L — ABNORMAL HIGH (ref 38–126)
Anion gap: 10 (ref 5–15)
BUN: 9 mg/dL (ref 8–23)
CO2: 28 mmol/L (ref 22–32)
Calcium: 8.2 mg/dL — ABNORMAL LOW (ref 8.9–10.3)
Chloride: 101 mmol/L (ref 98–111)
Creatinine: 0.39 mg/dL — ABNORMAL LOW (ref 0.44–1.00)
GFR, Estimated: >60 mL/min (ref >60)
Glucose, Bld: 115 mg/dL — ABNORMAL HIGH (ref 70–99)
Potassium: 3.1 mmol/L — ABNORMAL LOW (ref 3.5–5.1)
Sodium: 139 mmol/L (ref 135–145)
Total Bilirubin: 1.2 mg/dL (ref 0.3–1.2)
Total Protein: 5.7 g/dL — ABNORMAL LOW (ref 6.5–8.1)

## 2020-12-12 LAB — MAGNESIUM: Magnesium: 1.7 mg/dL (ref 1.7–2.4)

## 2020-12-12 LAB — CEA (ACCESS): CEA (CHCC): 32074.88 ng/mL — ABNORMAL HIGH (ref 0.00–5.00)

## 2020-12-12 MED ORDER — DIPHENHYDRAMINE HCL 50 MG/ML IJ SOLN
25.0000 mg | Freq: Once | INTRAMUSCULAR | Status: AC
  Administered 2020-12-12: 25 mg via INTRAVENOUS

## 2020-12-12 MED ORDER — DEXTROSE 5 % IV SOLN
Freq: Once | INTRAVENOUS | Status: AC
Start: 2020-12-12 — End: 2020-12-12
  Filled 2020-12-12: qty 250

## 2020-12-12 MED ORDER — OXALIPLATIN CHEMO INJECTION 100 MG/20ML
65.0000 mg/m2 | Freq: Once | INTRAVENOUS | Status: AC
  Administered 2020-12-12: 110 mg via INTRAVENOUS
  Filled 2020-12-12: qty 22

## 2020-12-12 MED ORDER — SODIUM CHLORIDE 0.9 % IV SOLN
Freq: Once | INTRAVENOUS | Status: AC
Start: 2020-12-12 — End: 2020-12-12
  Filled 2020-12-12: qty 250

## 2020-12-12 MED ORDER — SODIUM CHLORIDE 0.9 % IV SOLN
150.0000 mg | Freq: Once | INTRAVENOUS | Status: AC
  Administered 2020-12-12: 150 mg via INTRAVENOUS
  Filled 2020-12-12: qty 5

## 2020-12-12 MED ORDER — PALONOSETRON HCL INJECTION 0.25 MG/5ML
0.2500 mg | Freq: Once | INTRAVENOUS | Status: AC
  Administered 2020-12-12: 0.25 mg via INTRAVENOUS
  Filled 2020-12-12: qty 5

## 2020-12-12 MED ORDER — SODIUM CHLORIDE 0.9 % IV SOLN
1600.0000 mg/m2 | INTRAVENOUS | Status: DC
  Administered 2020-12-12: 2750 mg via INTRAVENOUS
  Filled 2020-12-12: qty 55

## 2020-12-12 MED ORDER — DEXAMETHASONE SODIUM PHOSPHATE 100 MG/10ML IJ SOLN
10.0000 mg | Freq: Once | INTRAMUSCULAR | Status: AC
  Administered 2020-12-12: 10 mg via INTRAVENOUS
  Filled 2020-12-12: qty 1

## 2020-12-12 MED ORDER — SODIUM CHLORIDE 0.9 % IV SOLN
Freq: Once | INTRAVENOUS | Status: DC | PRN
  Filled 2020-12-12: qty 250

## 2020-12-12 MED ORDER — SODIUM CHLORIDE 0.9 % IV SOLN
5.0000 mg/kg | Freq: Once | INTRAVENOUS | Status: AC
  Administered 2020-12-12: 350 mg via INTRAVENOUS
  Filled 2020-12-12: qty 14

## 2020-12-12 MED ORDER — LEUCOVORIN CALCIUM INJECTION 350 MG
300.0000 mg/m2 | Freq: Once | INTRAVENOUS | Status: AC
  Administered 2020-12-12: 520 mg via INTRAVENOUS
  Filled 2020-12-12: qty 26

## 2020-12-12 MED ORDER — SODIUM CHLORIDE 0.9% FLUSH
10.0000 mL | INTRAVENOUS | Status: DC | PRN
  Administered 2020-12-12: 10 mL
  Filled 2020-12-12: qty 10

## 2020-12-12 NOTE — Progress Notes (Signed)
1150--Patient complained of hands itching and palms are red.  Oxaliplatin and leucovorin paused, NS started. Dr. Benay Spice made aware. Verbal order received for 25mg  benadryl IV (see MAR).   Patient was able to complete treatment without any other complaints.

## 2020-12-12 NOTE — Progress Notes (Signed)
[facility] OFFICE PROGRESS NOTE   Diagnosis: Colon cancer  INTERVAL HISTORY:   Rachel Frank returns as scheduled.  She completed cycle 2 FOLFOX/Avastin 11/27/2020.  She has mild intermittent nausea.  This typically occurs with eating.  No mouth sores though she does note mild discomfort with acidic foods/drinks.  Lips are dry at times.  No diarrhea.  Stools are intermittently loose.  She occasionally notes blood with a bowel movement if she has a hard stool.  No other bleeding.  Stable neuropathy symptoms.  Objective:  Vital signs in last 24 hours:  Blood pressure (!) 150/99, pulse 88, temperature 97.7 F (36.5 C), temperature source Oral, resp. rate 18, height 5\' 2"  (1.575 m), weight 149 lb 9.6 oz (67.9 kg), SpO2 100 %.    HEENT: No thrush or ulcers. Resp: Lungs clear bilaterally. Cardio: Regular rate and rhythm. GI: Liver palpable throughout the upper abdomen with associated tenderness. Vascular: No leg edema. Port-A-Cath without erythema.  Lab Results:  Lab Results  Component Value Date   WBC 7.9 12/12/2020   HGB 11.9 (L) 12/12/2020   HCT 37.0 12/12/2020   MCV 83.3 12/12/2020   PLT 190 12/12/2020   NEUTROABS 5.2 12/12/2020    Imaging:  No results found.  Medications: I have reviewed the patient's current medications.  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 She presented with a large cecal mass, extensive hepatic metastatic disease; mild thoracic and abdominal adenopathy. She also has several small nonspecific pulmonary nodules. She was diagnosed in March 2021.   Colonoscopy 09/16/2019-fungating mass at the cecum, sessile polyps in the this sigmoid, transverse colon, and hepatic flexure, adenocarcinoma involving cecum biopsy, tubular adenoma and sessile serrated polyps  CTs chest, abdomen, and pelvis 10/08/2019-extensive liver metastases, thoracic/abdominal adenopathy, cecal mass, dilated and thin thickened appendix, nonspecific  pulmonary nodules  Ultrasound-guided liver biopsy 10/13/2019-adenocarcinoma consistent with a metastatic colorectal primary, MSS, tumor mutation burden 3, KRAS Q61,  Cycle 1 FOLFOX 10/14/2019  Cycle 2 FOLFOX 10/27/2019  Cycle 3 FOLFOX 11/11/2019, Emend and Decadron added, 5-FU dose escalated  Cycle 4 FOLFOX 11/25/2019, Udenyca added  Cycle 5 FOLFOX 12/08/2019  Cycle 6 FOLFOX 12/23/2019  CT 01/04/2020-decreased size of multiple hepatic metastases, slight decrease in bulk of cecal mass, stable ileocecal adenopathy  Cycle 7 FOLFOX 01/06/2020  Cycle 8 FOLFOX 01/31/2020  Cycle 9 FOLFOX 02/17/2020 (oxaliplatin dose reduced due to borderline ANC, persistent cold sensitivity)  Cycle 10 FOLFOX 03/02/2020  Cycle 11 FOLFOX 03/16/2020  Cycle 12 FOLFOX 03/30/2020 (oxaliplatin held secondary to neuropathy)  CT abdomen/pelvis 04/19/2020-no significant change in the abnormal soft tissue lesion in the region of the cecal tip involving the ileocecal valve and appendiceal orifice; slight interval progression of numerous hepatic metastases; similar appearance of small lymph nodes in the ileocolic mesentery with adjacent irregular focus of soft tissue also stable  Cycle 1 FOLFIRI/Avastin 04/25/2020  Cycle 2 FOLFIRI/Avastin 05/11/2020  Cycle 3 FOLFIRI 05/25/2020 (Avastin held due to proteinuria)  Cycle 4 FOLFIRI/Avastin 06/07/2020  Cycle 5 FOLFIRI/Avastin 06/22/2020  CTs 07/05/2020-mild improvement in multifocal liver metastases. Mass at the base of the cecum is stable. Persistent thickening of the appendix.  Cycle 6 FOLFIRI/Avastin 07/05/2020  Cycle 7 FOLFIRI/Avastin 07/20/2020  Cycle 8 FOLFIRI/Avastin 08/03/2020  Cycle 9 FOLFIRI 09/05/2020, Avastin held secondary to proteinuria  CT chest 09/05/2020-negative for pulmonary embolism, no acute disease, unchanged small pulmonary nodules  Cycle 10 FOLFIRI 09/20/2020, Avastin held secondary to proteinuria  Cycle 11 FOLFIRI 10/04/2020, Avastin held secondary to  proteinuria  CTs 10/17/2020-increase in size of liver metastases, stable cecal mass, slight decrease in size of right lower quadrant mesenteric lymph node  Cycle 12 FOLFIRI 10/18/2020, Avastin held secondary to proteinuria  Cycle 1 FOLFOX/Avastin 11/13/2020  Cycle 2 FOLFOX/Avastin 11/27/2020  Cycle 3 FOLFOX/Avastin 12/12/2020 2) Iron deficiency anemia secondary to #1. 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 Diarrhea 01/20/2020. C. difficile negative 7. COVID-19 infection 08/13/2020, antibody infusion therapy on 08/19/2020 8. 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 Inflamed left labial cyst 09/29/2020-doxycycline   Disposition: Rachel Frank appears unchanged.  She has completed 2 cycles of FOLFOX/Avastin.  Plan to proceed with cycle 3 today as scheduled.  We reviewed the CBC from today.  Counts adequate to proceed with treatment.  She will submit a 24-hour urine for protein prior to next office visit.  She will return for lab, follow-up, cycle 4 FOLFOX/Avastin in 2 weeks.  She will contact the office in the interim with any problems.    Ned Card ANP/GNP-BC   12/12/2020  9:02 AM

## 2020-12-12 NOTE — Patient Instructions (Signed)
[facility]  Discharge Instructions: Thank you for choosing [facility] to provide your oncology and hematology care.   If you have a lab appointment with the [facility], please go directly to the [facility] and check in at the registration area.   Wear comfortable clothing and clothing appropriate for easy access to any Portacath or PICC line.   We strive to give you quality time with your provider. You may need to reschedule your appointment if you arrive late (15 or more minutes).  Arriving late affects you and other patients whose appointments are after yours.  Also, if you miss three or more appointments without notifying the office, you may be dismissed from the clinic at the provider's discretion.      For prescription refill requests, have your pharmacy contact our office and allow 72 hours for refills to be completed.    Today you received the following chemotherapy and/or immunotherapy agents Avastin, Leucovorin, Oxaliplatin, and 5FU      To help prevent nausea and vomiting after your treatment, we encourage you to take your nausea medication as directed.  BELOW ARE SYMPTOMS THAT SHOULD BE REPORTED IMMEDIATELY: . *FEVER GREATER THAN 100.4 F (38 C) OR HIGHER . *CHILLS OR SWEATING . *NAUSEA AND VOMITING THAT IS NOT CONTROLLED WITH YOUR NAUSEA MEDICATION . *UNUSUAL SHORTNESS OF BREATH . *UNUSUAL BRUISING OR BLEEDING . *URINARY PROBLEMS (pain or burning when urinating, or frequent urination) . *BOWEL PROBLEMS (unusual diarrhea, constipation, pain near the anus) . TENDERNESS IN MOUTH AND THROAT WITH OR WITHOUT PRESENCE OF ULCERS (sore throat, sores in mouth, or a toothache) . UNUSUAL RASH, SWELLING OR PAIN  . UNUSUAL VAGINAL DISCHARGE OR ITCHING   Items with * indicate a potential emergency and should be followed up as soon as possible or go to the Emergency Department if any problems should occur.  Please show the CHEMOTHERAPY ALERT  CARD or IMMUNOTHERAPY ALERT CARD at check-in to the Emergency Department and triage nurse.  Should you have questions after your visit or need to cancel or reschedule your appointment, please contact [facility]  Dept: [phone]  and follow the prompts.  Office hours are 8:00 a.m. to 4:30 p.m. Monday - Friday. Please note that voicemails left after 4:00 p.m. may not be returned until the following business day.  We are closed weekends and major holidays. You have access to a nurse at all times for urgent questions. Please call the main number to the clinic Dept: [phone] and follow the prompts.   For any non-urgent questions, you may also contact your provider using MyChart. We now offer e-Visits for anyone 60 and older to request care online for non-urgent symptoms. For details visit [email or web address].   Also download the MyChart app! Go to the app store, search "MyChart", open the app, select [facility], and log in with your MyChart username and password.  Due to Covid, a mask is required upon entering the hospital/clinic. If you do not have a mask, one will be given to you upon arrival. For doctor visits, patients may have 1 support person aged 63 or older with them. For treatment visits, patients cannot have anyone with them due to current Covid guidelines and our immunocompromised population.   Bevacizumab injection What is this medicine? BEVACIZUMAB (be va SIZ yoo mab) is a monoclonal antibody. It is used to treat many types of cancer. This medicine may be used for other purposes; ask your  health care provider or pharmacist if you have questions. COMMON BRAND NAME(S): Avastin, MVASI, Zirabev What should I tell my health care provider before I take this medicine? They need to know if you have any of these conditions:  diabetes  heart disease  high blood pressure  history of coughing up blood  prior anthracycline chemotherapy (e.g., doxorubicin,  daunorubicin, epirubicin)  recent or ongoing radiation therapy  recent or planning to have surgery  stroke  an unusual or allergic reaction to bevacizumab, hamster proteins, mouse proteins, other medicines, foods, dyes, or preservatives  pregnant or trying to get pregnant  breast-feeding How should I use this medicine? This medicine is for infusion into a vein. It is given by a health care professional in a hospital or clinic setting. Talk to your pediatrician regarding the use of this medicine in children. Special care may be needed. Overdosage: If you think you have taken too much of this medicine contact a poison control center or emergency room at once. NOTE: This medicine is only for you. Do not share this medicine with others. What if I miss a dose? It is important not to miss your dose. Call your doctor or health care professional if you are unable to keep an appointment. What may interact with this medicine? Interactions are not expected. This list may not describe all possible interactions. Give your health care provider a list of all the medicines, herbs, non-prescription drugs, or dietary supplements you use. Also tell them if you smoke, drink alcohol, or use illegal drugs. Some items may interact with your medicine. What should I watch for while using this medicine? Your condition will be monitored carefully while you are receiving this medicine. You will need important blood work and urine testing done while you are taking this medicine. This medicine may increase your risk to bruise or bleed. Call your doctor or health care professional if you notice any unusual bleeding. Before having surgery, talk to your health care provider to make sure it is ok. This drug can increase the risk of poor healing of your surgical site or wound. You will need to stop this drug for 28 days before surgery. After surgery, wait at least 28 days before restarting this drug. Make sure the surgical  site or wound is healed enough before restarting this drug. Talk to your health care provider if questions. Do not become pregnant while taking this medicine or for 6 months after stopping it. Women should inform their doctor if they wish to become pregnant or think they might be pregnant. There is a potential for serious side effects to an unborn child. Talk to your health care professional or pharmacist for more information. Do not breast-feed an infant while taking this medicine and for 6 months after the last dose. This medicine has caused ovarian failure in some women. This medicine may interfere with the ability to have a child. You should talk to your doctor or health care professional if you are concerned about your fertility. What side effects may I notice from receiving this medicine? Side effects that you should report to your doctor or health care professional as soon as possible:  allergic reactions like skin rash, itching or hives, swelling of the face, lips, or tongue  chest pain or chest tightness  chills  coughing up blood  high fever  seizures  severe constipation  signs and symptoms of bleeding such as bloody or black, tarry stools; red or dark-brown urine; spitting up blood or  brown material that looks like coffee grounds; red spots on the skin; unusual bruising or bleeding from the eye, gums, or nose  signs and symptoms of a blood clot such as breathing problems; chest pain; severe, sudden headache; pain, swelling, warmth in the leg  signs and symptoms of a stroke like changes in vision; confusion; trouble speaking or understanding; severe headaches; sudden numbness or weakness of the face, arm or leg; trouble walking; dizziness; loss of balance or coordination  stomach pain  sweating  swelling of legs or ankles  vomiting  weight gain Side effects that usually do not require medical attention (report to your doctor or health care professional if they continue  or are bothersome):  back pain  changes in taste  decreased appetite  dry skin  nausea  tiredness This list may not describe all possible side effects. Call your doctor for medical advice about side effects. You may report side effects to FDA at 1-800-FDA-1088. Where should I keep my medicine? This drug is given in a hospital or clinic and will not be stored at home. NOTE: This sheet is a summary. It may not cover all possible information. If you have questions about this medicine, talk to your doctor, pharmacist, or health care provider.  2021 Elsevier/Gold Standard (2019-04-28 10:50:46)  Oxaliplatin Injection What is this medicine? OXALIPLATIN (ox AL i PLA tin) is a chemotherapy drug. It targets fast dividing cells, like cancer cells, and causes these cells to die. This medicine is used to treat cancers of the colon and rectum, and many other cancers. This medicine may be used for other purposes; ask your health care provider or pharmacist if you have questions. COMMON BRAND NAME(S): Eloxatin What should I tell my health care provider before I take this medicine? They need to know if you have any of these conditions:  heart disease  history of irregular heartbeat  liver disease  low blood counts, like white cells, platelets, or red blood cells  lung or breathing disease, like asthma  take medicines that treat or prevent blood clots  tingling of the fingers or toes, or other nerve disorder  an unusual or allergic reaction to oxaliplatin, other chemotherapy, other medicines, foods, dyes, or preservatives  pregnant or trying to get pregnant  breast-feeding How should I use this medicine? This drug is given as an infusion into a vein. It is administered in a hospital or clinic by a specially trained health care professional. Talk to your pediatrician regarding the use of this medicine in children. Special care may be needed. Overdosage: If you think you have taken too  much of this medicine contact a poison control center or emergency room at once. NOTE: This medicine is only for you. Do not share this medicine with others. What if I miss a dose? It is important not to miss a dose. Call your doctor or health care professional if you are unable to keep an appointment. What may interact with this medicine? Do not take this medicine with any of the following medications:  cisapride  dronedarone  pimozide  thioridazine This medicine may also interact with the following medications:  aspirin and aspirin-like medicines  certain medicines that treat or prevent blood clots like warfarin, apixaban, dabigatran, and rivaroxaban  cisplatin  cyclosporine  diuretics  medicines for infection like acyclovir, adefovir, amphotericin B, bacitracin, cidofovir, foscarnet, ganciclovir, gentamicin, pentamidine, vancomycin  NSAIDs, medicines for pain and inflammation, like ibuprofen or naproxen  other medicines that prolong the QT interval (  an abnormal heart rhythm)  pamidronate  zoledronic acid This list may not describe all possible interactions. Give your health care provider a list of all the medicines, herbs, non-prescription drugs, or dietary supplements you use. Also tell them if you smoke, drink alcohol, or use illegal drugs. Some items may interact with your medicine. What should I watch for while using this medicine? Your condition will be monitored carefully while you are receiving this medicine. You may need blood work done while you are taking this medicine. This medicine may make you feel generally unwell. This is not uncommon as chemotherapy can affect healthy cells as well as cancer cells. Report any side effects. Continue your course of treatment even though you feel ill unless your healthcare professional tells you to stop. This medicine can make you more sensitive to cold. Do not drink cold drinks or use ice. Cover exposed skin before coming in  contact with cold temperatures or cold objects. When out in cold weather wear warm clothing and cover your mouth and nose to warm the air that goes into your lungs. Tell your doctor if you get sensitive to the cold. Do not become pregnant while taking this medicine or for 9 months after stopping it. Women should inform their health care professional if they wish to become pregnant or think they might be pregnant. Men should not father a child while taking this medicine and for 6 months after stopping it. There is potential for serious side effects to an unborn child. Talk to your health care professional for more information. Do not breast-feed a child while taking this medicine or for 3 months after stopping it. This medicine has caused ovarian failure in some women. This medicine may make it more difficult to get pregnant. Talk to your health care professional if you are concerned about your fertility. This medicine has caused decreased sperm counts in some men. This may make it more difficult to father a child. Talk to your health care professional if you are concerned about your fertility. This medicine may increase your risk of getting an infection. Call your health care professional for advice if you get a fever, chills, or sore throat, or other symptoms of a cold or flu. Do not treat yourself. Try to avoid being around people who are sick. Avoid taking medicines that contain aspirin, acetaminophen, ibuprofen, naproxen, or ketoprofen unless instructed by your health care professional. These medicines may hide a fever. Be careful brushing or flossing your teeth or using a toothpick because you may get an infection or bleed more easily. If you have any dental work done, tell your dentist you are receiving this medicine. What side effects may I notice from receiving this medicine? Side effects that you should report to your doctor or health care professional as soon as possible:  allergic reactions  like skin rash, itching or hives, swelling of the face, lips, or tongue  breathing problems  cough  low blood counts - this medicine may decrease the number of white blood cells, red blood cells, and platelets. You may be at increased risk for infections and bleeding  nausea, vomiting  pain, redness, or irritation at site where injected  pain, tingling, numbness in the hands or feet  signs and symptoms of bleeding such as bloody or black, tarry stools; red or dark brown urine; spitting up blood or brown material that looks like coffee grounds; red spots on the skin; unusual bruising or bleeding from the eyes, gums, or nose  signs and symptoms of a dangerous change in heartbeat or heart rhythm like chest pain; dizziness; fast, irregular heartbeat; palpitations; feeling faint or lightheaded; falls  signs and symptoms of infection like fever; chills; cough; sore throat; pain or trouble passing urine  signs and symptoms of liver injury like dark yellow or brown urine; general ill feeling or flu-like symptoms; light-colored stools; loss of appetite; nausea; right upper belly pain; unusually weak or tired; yellowing of the eyes or skin  signs and symptoms of low red blood cells or anemia such as unusually weak or tired; feeling faint or lightheaded; falls  signs and symptoms of muscle injury like dark urine; trouble passing urine or change in the amount of urine; unusually weak or tired; muscle pain; back pain Side effects that usually do not require medical attention (report to your doctor or health care professional if they continue or are bothersome):  changes in taste  diarrhea  gas  hair loss  loss of appetite  mouth sores This list may not describe all possible side effects. Call your doctor for medical advice about side effects. You may report side effects to FDA at 1-800-FDA-1088. Where should I keep my medicine? This drug is given in a hospital or clinic and will not be  stored at home. NOTE: This sheet is a summary. It may not cover all possible information. If you have questions about this medicine, talk to your doctor, pharmacist, or health care provider.  2021 Elsevier/Gold Standard (2018-11-18 12:20:35)  Leucovorin injection What is this medicine? LEUCOVORIN (loo koe VOR in) is used to prevent or treat the harmful effects of some medicines. This medicine is used to treat anemia caused by a low amount of folic acid in the body. It is also used with 5-fluorouracil (5-FU) to treat colon cancer. This medicine may be used for other purposes; ask your health care provider or pharmacist if you have questions. What should I tell my health care provider before I take this medicine? They need to know if you have any of these conditions:  anemia from low levels of vitamin B-12 in the blood  an unusual or allergic reaction to leucovorin, folic acid, other medicines, foods, dyes, or preservatives  pregnant or trying to get pregnant  breast-feeding How should I use this medicine? This medicine is for injection into a muscle or into a vein. It is given by a health care professional in a hospital or clinic setting. Talk to your pediatrician regarding the use of this medicine in children. Special care may be needed. Overdosage: If you think you have taken too much of this medicine contact a poison control center or emergency room at once. NOTE: This medicine is only for you. Do not share this medicine with others. What if I miss a dose? This does not apply. What may interact with this medicine?  capecitabine  fluorouracil  phenobarbital  phenytoin  primidone  trimethoprim-sulfamethoxazole This list may not describe all possible interactions. Give your health care provider a list of all the medicines, herbs, non-prescription drugs, or dietary supplements you use. Also tell them if you smoke, drink alcohol, or use illegal drugs. Some items may interact with  your medicine. What should I watch for while using this medicine? Your condition will be monitored carefully while you are receiving this medicine. This medicine may increase the side effects of 5-fluorouracil, 5-FU. Tell your doctor or health care professional if you have diarrhea or mouth sores that do not get better or  that get worse. What side effects may I notice from receiving this medicine? Side effects that you should report to your doctor or health care professional as soon as possible:  allergic reactions like skin rash, itching or hives, swelling of the face, lips, or tongue  breathing problems  fever, infection  mouth sores  unusual bleeding or bruising  unusually weak or tired Side effects that usually do not require medical attention (report to your doctor or health care professional if they continue or are bothersome):  constipation or diarrhea  loss of appetite  nausea, vomiting This list may not describe all possible side effects. Call your doctor for medical advice about side effects. You may report side effects to FDA at 1-800-FDA-1088. Where should I keep my medicine? This drug is given in a hospital or clinic and will not be stored at home. NOTE: This sheet is a summary. It may not cover all possible information. If you have questions about this medicine, talk to your doctor, pharmacist, or health care provider.  2021 Elsevier/Gold Standard (2008-01-05 16:50:29)  Fluorouracil, 5-FU injection What is this medicine? FLUOROURACIL, 5-FU (flure oh YOOR a sil) is a chemotherapy drug. It slows the growth of cancer cells. This medicine is used to treat many types of cancer like breast cancer, colon or rectal cancer, pancreatic cancer, and stomach cancer. This medicine may be used for other purposes; ask your health care provider or pharmacist if you have questions. COMMON BRAND NAME(S): Adrucil What should I tell my health care provider before I take this  medicine? They need to know if you have any of these conditions:  blood disorders  dihydropyrimidine dehydrogenase (DPD) deficiency  infection (especially a virus infection such as chickenpox, cold sores, or herpes)  kidney disease  liver disease  malnourished, poor nutrition  recent or ongoing radiation therapy  an unusual or allergic reaction to fluorouracil, other chemotherapy, other medicines, foods, dyes, or preservatives  pregnant or trying to get pregnant  breast-feeding How should I use this medicine? This drug is given as an infusion or injection into a vein. It is administered in a hospital or clinic by a specially trained health care professional. Talk to your pediatrician regarding the use of this medicine in children. Special care may be needed. Overdosage: If you think you have taken too much of this medicine contact a poison control center or emergency room at once. NOTE: This medicine is only for you. Do not share this medicine with others. What if I miss a dose? It is important not to miss your dose. Call your doctor or health care professional if you are unable to keep an appointment. What may interact with this medicine? Do not take this medicine with any of the following medications:  live virus vaccines This medicine may also interact with the following medications:  medicines that treat or prevent blood clots like warfarin, enoxaparin, and dalteparin This list may not describe all possible interactions. Give your health care provider a list of all the medicines, herbs, non-prescription drugs, or dietary supplements you use. Also tell them if you smoke, drink alcohol, or use illegal drugs. Some items may interact with your medicine. What should I watch for while using this medicine? Visit your doctor for checks on your progress. This drug may make you feel generally unwell. This is not uncommon, as chemotherapy can affect healthy cells as well as cancer  cells. Report any side effects. Continue your course of treatment even though you feel ill  unless your doctor tells you to stop. In some cases, you may be given additional medicines to help with side effects. Follow all directions for their use. Call your doctor or health care professional for advice if you get a fever, chills or sore throat, or other symptoms of a cold or flu. Do not treat yourself. This drug decreases your body's ability to fight infections. Try to avoid being around people who are sick. This medicine may increase your risk to bruise or bleed. Call your doctor or health care professional if you notice any unusual bleeding. Be careful brushing and flossing your teeth or using a toothpick because you may get an infection or bleed more easily. If you have any dental work done, tell your dentist you are receiving this medicine. Avoid taking products that contain aspirin, acetaminophen, ibuprofen, naproxen, or ketoprofen unless instructed by your doctor. These medicines may hide a fever. Do not become pregnant while taking this medicine. Women should inform their doctor if they wish to become pregnant or think they might be pregnant. There is a potential for serious side effects to an unborn child. Talk to your health care professional or pharmacist for more information. Do not breast-feed an infant while taking this medicine. Men should inform their doctor if they wish to father a child. This medicine may lower sperm counts. Do not treat diarrhea with over the counter products. Contact your doctor if you have diarrhea that lasts more than 2 days or if it is severe and watery. This medicine can make you more sensitive to the sun. Keep out of the sun. If you cannot avoid being in the sun, wear protective clothing and use sunscreen. Do not use sun lamps or tanning beds/booths. What side effects may I notice from receiving this medicine? Side effects that you should report to your doctor or  health care professional as soon as possible:  allergic reactions like skin rash, itching or hives, swelling of the face, lips, or tongue  low blood counts - this medicine may decrease the number of white blood cells, red blood cells and platelets. You may be at increased risk for infections and bleeding.  signs of infection - fever or chills, cough, sore throat, pain or difficulty passing urine  signs of decreased platelets or bleeding - bruising, pinpoint red spots on the skin, black, tarry stools, blood in the urine  signs of decreased red blood cells - unusually weak or tired, fainting spells, lightheadedness  breathing problems  changes in vision  chest pain  mouth sores  nausea and vomiting  pain, swelling, redness at site where injected  pain, tingling, numbness in the hands or feet  redness, swelling, or sores on hands or feet  stomach pain  unusual bleeding Side effects that usually do not require medical attention (report to your doctor or health care professional if they continue or are bothersome):  changes in finger or toe nails  diarrhea  dry or itchy skin  hair loss  headache  loss of appetite  sensitivity of eyes to the light  stomach upset  unusually teary eyes This list may not describe all possible side effects. Call your doctor for medical advice about side effects. You may report side effects to FDA at 1-800-FDA-1088. Where should I keep my medicine? This drug is given in a hospital or clinic and will not be stored at home. NOTE: This sheet is a summary. It may not cover all possible information. If you have questions about  this medicine, talk to your doctor, pharmacist, or health care provider.  2021 [employer]vier/Gold Standard (2019-06-01 15:00:03)

## 2020-12-12 NOTE — Patient Instructions (Signed)

## 2020-12-13 LAB — CEA (IN HOUSE-CHCC): CEA (CHCC-In House): 33343.02 ng/mL — ABNORMAL HIGH (ref 0.00–5.00)

## 2020-12-14 ENCOUNTER — Inpatient Hospital Stay: Payer: 59 | Attending: Physician Assistant

## 2020-12-14 ENCOUNTER — Other Ambulatory Visit: Payer: Self-pay

## 2020-12-14 ENCOUNTER — Ambulatory Visit: Payer: 59

## 2020-12-14 VITALS — BP 150/91 | HR 94 | Resp 18

## 2020-12-14 DIAGNOSIS — E876 Hypokalemia: Secondary | ICD-10-CM | POA: Diagnosis not present

## 2020-12-14 DIAGNOSIS — C180 Malignant neoplasm of cecum: Secondary | ICD-10-CM | POA: Insufficient documentation

## 2020-12-14 DIAGNOSIS — D630 Anemia in neoplastic disease: Secondary | ICD-10-CM | POA: Diagnosis not present

## 2020-12-14 DIAGNOSIS — R110 Nausea: Secondary | ICD-10-CM | POA: Diagnosis not present

## 2020-12-14 DIAGNOSIS — Z5111 Encounter for antineoplastic chemotherapy: Secondary | ICD-10-CM | POA: Diagnosis present

## 2020-12-14 DIAGNOSIS — T451X5D Adverse effect of antineoplastic and immunosuppressive drugs, subsequent encounter: Secondary | ICD-10-CM | POA: Diagnosis not present

## 2020-12-14 DIAGNOSIS — Z5189 Encounter for other specified aftercare: Secondary | ICD-10-CM | POA: Insufficient documentation

## 2020-12-14 DIAGNOSIS — Z452 Encounter for adjustment and management of vascular access device: Secondary | ICD-10-CM | POA: Insufficient documentation

## 2020-12-14 DIAGNOSIS — G893 Neoplasm related pain (acute) (chronic): Secondary | ICD-10-CM | POA: Insufficient documentation

## 2020-12-14 DIAGNOSIS — C19 Malignant neoplasm of rectosigmoid junction: Secondary | ICD-10-CM

## 2020-12-14 DIAGNOSIS — Z5112 Encounter for antineoplastic immunotherapy: Secondary | ICD-10-CM | POA: Diagnosis present

## 2020-12-14 DIAGNOSIS — G620 Drug-induced polyneuropathy: Secondary | ICD-10-CM | POA: Diagnosis not present

## 2020-12-14 DIAGNOSIS — R197 Diarrhea, unspecified: Secondary | ICD-10-CM | POA: Insufficient documentation

## 2020-12-14 MED ORDER — PEGFILGRASTIM-CBQV 6 MG/0.6ML ~~LOC~~ SOSY
6.0000 mg | PREFILLED_SYRINGE | Freq: Once | SUBCUTANEOUS | Status: AC
  Administered 2020-12-14: 6 mg via SUBCUTANEOUS

## 2020-12-14 MED ORDER — SODIUM CHLORIDE 0.9% FLUSH
10.0000 mL | INTRAVENOUS | Status: DC | PRN
  Administered 2020-12-14: 10 mL
  Filled 2020-12-14: qty 10

## 2020-12-14 MED ORDER — HEPARIN SOD (PORK) LOCK FLUSH 100 UNIT/ML IV SOLN
500.0000 [IU] | Freq: Once | INTRAVENOUS | Status: AC | PRN
  Administered 2020-12-14: 500 [IU]
  Filled 2020-12-14: qty 5

## 2020-12-14 NOTE — Patient Instructions (Signed)
Pegfilgrastim injection What is this medicine? PEGFILGRASTIM (PEG fil gra stim) is a long-acting granulocyte colony-stimulating factor that stimulates the growth of neutrophils, a type of white blood cell important in the body's fight against infection. It is used to reduce the incidence of fever and infection in patients with certain types of cancer who are receiving chemotherapy that affects the bone marrow, and to increase survival after being exposed to high doses of radiation. This medicine may be used for other purposes; ask your health care provider or pharmacist if you have questions. COMMON BRAND NAME(S): Fulphila, Neulasta, Nyvepria, UDENYCA, Ziextenzo What should I tell my health care provider before I take this medicine? They need to know if you have any of these conditions:  kidney disease  latex allergy  ongoing radiation therapy  sickle cell disease  skin reactions to acrylic adhesives (On-Body Injector only)  an unusual or allergic reaction to pegfilgrastim, filgrastim, other medicines, foods, dyes, or preservatives  pregnant or trying to get pregnant  breast-feeding How should I use this medicine? This medicine is for injection under the skin. If you get this medicine at home, you will be taught how to prepare and give the pre-filled syringe or how to use the On-body Injector. Refer to the patient Instructions for Use for detailed instructions. Use exactly as directed. Tell your healthcare provider immediately if you suspect that the On-body Injector may not have performed as intended or if you suspect the use of the On-body Injector resulted in a missed or partial dose. It is important that you put your used needles and syringes in a special sharps container. Do not put them in a trash can. If you do not have a sharps container, call your pharmacist or healthcare provider to get one. Talk to your pediatrician regarding the use of this medicine in children. While this drug  may be prescribed for selected conditions, precautions do apply. Overdosage: If you think you have taken too much of this medicine contact a poison control center or emergency room at once. NOTE: This medicine is only for you. Do not share this medicine with others. What if I miss a dose? It is important not to miss your dose. Call your doctor or health care professional if you miss your dose. If you miss a dose due to an On-body Injector failure or leakage, a new dose should be administered as soon as possible using a single prefilled syringe for manual use. What may interact with this medicine? Interactions have not been studied. This list may not describe all possible interactions. Give your health care provider a list of all the medicines, herbs, non-prescription drugs, or dietary supplements you use. Also tell them if you smoke, drink alcohol, or use illegal drugs. Some items may interact with your medicine. What should I watch for while using this medicine? Your condition will be monitored carefully while you are receiving this medicine. You may need blood work done while you are taking this medicine. Talk to your health care provider about your risk of cancer. You may be more at risk for certain types of cancer if you take this medicine. If you are going to need a MRI, CT scan, or other procedure, tell your doctor that you are using this medicine (On-Body Injector only). What side effects may I notice from receiving this medicine? Side effects that you should report to your doctor or health care professional as soon as possible:  allergic reactions (skin rash, itching or hives, swelling of   the face, lips, or tongue)  back pain  dizziness  fever  pain, redness, or irritation at site where injected  pinpoint red spots on the skin  red or dark-brown urine  shortness of breath or breathing problems  stomach or side pain, or pain at the shoulder  swelling  tiredness  trouble  passing urine or change in the amount of urine  unusual bruising or bleeding Side effects that usually do not require medical attention (report to your doctor or health care professional if they continue or are bothersome):  bone pain  muscle pain This list may not describe all possible side effects. Call your doctor for medical advice about side effects. You may report side effects to FDA at 1-800-FDA-1088. Where should I keep my medicine? Keep out of the reach of children. If you are using this medicine at home, you will be instructed on how to store it. Throw away any unused medicine after the expiration date on the label. NOTE: This sheet is a summary. It may not cover all possible information. If you have questions about this medicine, talk to your doctor, pharmacist, or health care provider.  2021 Elsevier/Gold Standard (2019-07-23 13:20:51)  

## 2020-12-24 ENCOUNTER — Other Ambulatory Visit: Payer: Self-pay | Admitting: Oncology

## 2020-12-25 ENCOUNTER — Other Ambulatory Visit (HOSPITAL_BASED_OUTPATIENT_CLINIC_OR_DEPARTMENT_OTHER): Payer: Self-pay

## 2020-12-25 DIAGNOSIS — C19 Malignant neoplasm of rectosigmoid junction: Secondary | ICD-10-CM

## 2020-12-25 DIAGNOSIS — Z5112 Encounter for antineoplastic immunotherapy: Secondary | ICD-10-CM | POA: Diagnosis not present

## 2020-12-26 ENCOUNTER — Other Ambulatory Visit: Payer: Self-pay

## 2020-12-26 ENCOUNTER — Encounter: Payer: Self-pay | Admitting: Nurse Practitioner

## 2020-12-26 ENCOUNTER — Inpatient Hospital Stay: Payer: 59

## 2020-12-26 ENCOUNTER — Inpatient Hospital Stay (HOSPITAL_BASED_OUTPATIENT_CLINIC_OR_DEPARTMENT_OTHER): Payer: 59 | Admitting: Nurse Practitioner

## 2020-12-26 VITALS — BP 185/100 | HR 98 | Temp 97.8°F | Resp 18 | Ht 62.0 in | Wt 155.0 lb

## 2020-12-26 VITALS — BP 178/99 | HR 93

## 2020-12-26 DIAGNOSIS — Z5112 Encounter for antineoplastic immunotherapy: Secondary | ICD-10-CM | POA: Diagnosis not present

## 2020-12-26 DIAGNOSIS — C19 Malignant neoplasm of rectosigmoid junction: Secondary | ICD-10-CM

## 2020-12-26 LAB — CBC WITH DIFFERENTIAL (CANCER CENTER ONLY)
Abs Immature Granulocytes: 0.02 10*3/uL (ref 0.00–0.07)
Basophils Absolute: 0.1 10*3/uL (ref 0.0–0.1)
Basophils Relative: 1 %
Eosinophils Absolute: 0.1 10*3/uL (ref 0.0–0.5)
Eosinophils Relative: 1 %
HCT: 37.5 % (ref 36.0–46.0)
Hemoglobin: 12 g/dL (ref 12.0–15.0)
Immature Granulocytes: 0 %
Lymphocytes Relative: 31 %
Lymphs Abs: 2.5 10*3/uL (ref 0.7–4.0)
MCH: 26.5 pg (ref 26.0–34.0)
MCHC: 32 g/dL (ref 30.0–36.0)
MCV: 83 fL (ref 80.0–100.0)
Monocytes Absolute: 0.7 10*3/uL (ref 0.1–1.0)
Monocytes Relative: 9 %
Neutro Abs: 4.6 10*3/uL (ref 1.7–7.7)
Neutrophils Relative %: 58 %
Platelet Count: 213 10*3/uL (ref 150–400)
RBC: 4.52 MIL/uL (ref 3.87–5.11)
RDW: 18.5 % — ABNORMAL HIGH (ref 11.5–15.5)
WBC Count: 8 10*3/uL (ref 4.0–10.5)
nRBC: 0 % (ref 0.0–0.2)

## 2020-12-26 LAB — PROTEIN, URINE, 24 HOUR
Collection Interval-UPROT: 24 hours
Protein, Urine: <6 mg/dL
Urine Total Volume-UPROT: 1700 mL

## 2020-12-26 LAB — CMP (CANCER CENTER ONLY)
ALT: 36 U/L (ref 0–44)
AST: 71 U/L — ABNORMAL HIGH (ref 15–41)
Albumin: 2.7 g/dL — ABNORMAL LOW (ref 3.5–5.0)
Alkaline Phosphatase: 1055 U/L — ABNORMAL HIGH (ref 38–126)
Anion gap: 10 (ref 5–15)
BUN: 7 mg/dL — ABNORMAL LOW (ref 8–23)
CO2: 27 mmol/L (ref 22–32)
Calcium: 8.4 mg/dL — ABNORMAL LOW (ref 8.9–10.3)
Chloride: 99 mmol/L (ref 98–111)
Creatinine: 0.33 mg/dL — ABNORMAL LOW (ref 0.44–1.00)
GFR, Estimated: >60 mL/min (ref >60)
Glucose, Bld: 112 mg/dL — ABNORMAL HIGH (ref 70–99)
Potassium: 3.2 mmol/L — ABNORMAL LOW (ref 3.5–5.1)
Sodium: 136 mmol/L (ref 135–145)
Total Bilirubin: 0.9 mg/dL (ref 0.3–1.2)
Total Protein: 6.2 g/dL — ABNORMAL LOW (ref 6.5–8.1)

## 2020-12-26 LAB — MAGNESIUM: Magnesium: 1.6 mg/dL — ABNORMAL LOW (ref 1.7–2.4)

## 2020-12-26 LAB — CEA (ACCESS): CEA (CHCC): 41580.61 ng/mL — ABNORMAL HIGH (ref 0.00–5.00)

## 2020-12-26 MED ORDER — SODIUM CHLORIDE 0.9 % IV SOLN
5.0000 mg/kg | Freq: Once | INTRAVENOUS | Status: AC
  Administered 2020-12-26: 350 mg via INTRAVENOUS
  Filled 2020-12-26: qty 14

## 2020-12-26 MED ORDER — DIPHENHYDRAMINE HCL 50 MG/ML IJ SOLN
50.0000 mg | Freq: Once | INTRAMUSCULAR | Status: AC
Start: 2020-12-26 — End: 2020-12-26
  Administered 2020-12-26: 50 mg via INTRAVENOUS
  Filled 2020-12-26: qty 1

## 2020-12-26 MED ORDER — DEXTROSE 5 % IV SOLN
Freq: Once | INTRAVENOUS | Status: AC
Start: 2020-12-26 — End: 2020-12-26
  Filled 2020-12-26: qty 250

## 2020-12-26 MED ORDER — SODIUM CHLORIDE 0.9% FLUSH
10.0000 mL | INTRAVENOUS | Status: DC | PRN
  Administered 2020-12-26: 10 mL
  Filled 2020-12-26: qty 10

## 2020-12-26 MED ORDER — LEUCOVORIN CALCIUM INJECTION 350 MG
300.0000 mg/m2 | Freq: Once | INTRAVENOUS | Status: AC
  Administered 2020-12-26: 520 mg via INTRAVENOUS
  Filled 2020-12-26: qty 26

## 2020-12-26 MED ORDER — SODIUM CHLORIDE 0.9 % IV SOLN
150.0000 mg | Freq: Once | INTRAVENOUS | Status: AC
  Administered 2020-12-26: 150 mg via INTRAVENOUS
  Filled 2020-12-26: qty 5

## 2020-12-26 MED ORDER — PALONOSETRON HCL INJECTION 0.25 MG/5ML
0.2500 mg | Freq: Once | INTRAVENOUS | Status: AC
Start: 2020-12-26 — End: 2020-12-26
  Administered 2020-12-26: 0.25 mg via INTRAVENOUS
  Filled 2020-12-26: qty 5

## 2020-12-26 MED ORDER — OXALIPLATIN CHEMO INJECTION 100 MG/20ML
65.0000 mg/m2 | Freq: Once | INTRAVENOUS | Status: AC
  Administered 2020-12-26: 110 mg via INTRAVENOUS
  Filled 2020-12-26: qty 22

## 2020-12-26 MED ORDER — SODIUM CHLORIDE 0.9 % IV SOLN
Freq: Once | INTRAVENOUS | Status: AC
  Filled 2020-12-26: qty 250

## 2020-12-26 MED ORDER — POTASSIUM CHLORIDE ER 10 MEQ PO CPCR: 20.0000 meq | ORAL_CAPSULE | Freq: Three times a day (TID) | ORAL | 3 refills | Status: DC

## 2020-12-26 MED ORDER — SODIUM CHLORIDE 0.9 % IV SOLN
10.0000 mg | Freq: Once | INTRAVENOUS | Status: AC
  Administered 2020-12-26: 10 mg via INTRAVENOUS
  Filled 2020-12-26: qty 1

## 2020-12-26 MED ORDER — SODIUM CHLORIDE 0.9 % IV SOLN
1600.0000 mg/m2 | INTRAVENOUS | Status: DC
  Administered 2020-12-26: 2750 mg via INTRAVENOUS
  Filled 2020-12-26: qty 55

## 2020-12-26 NOTE — Progress Notes (Signed)
[facility] OFFICE PROGRESS NOTE   Diagnosis: Colon cancer  INTERVAL HISTORY:   Ms. Corea returns as scheduled.  She completed cycle 3 FOLFOX/Avastin on 12/12/2020.  She has mild intermittent nausea.  No mouth sores.  Stools are loose but she does not characterize this as diarrhea.  She denies bleeding.  She had some abdominal pain last week.  She did not take her blood pressure medication this morning.  Objective:  Vital signs in last 24 hours:  Blood pressure (!) 185/100, pulse 98, temperature 97.8 F (36.6 C), temperature source Oral, resp. rate 18, height $RemoveBe'5\' 2"'[id number]$  (1.575 m), weight 155 lb (70.3 kg), SpO2 100 %.    HEENT: No thrush or ulcers. Resp: Lungs clear bilaterally. Cardio: Regular rate and rhythm. GI: Abdomen is distended.  No apparent ascites.  Liver palpable throughout the upper abdomen, mild associated tenderness. Vascular: No leg edema.   Port-A-Cath without erythema.  Lab Results:  Lab Results  Component Value Date   WBC 8.0 12/26/2020   HGB 12.0 12/26/2020   HCT 37.5 12/26/2020   MCV 83.0 12/26/2020   PLT 213 12/26/2020   NEUTROABS 4.6 12/26/2020    Imaging:  No results found.  Medications: I have reviewed the patient's current medications.  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 She presented with a large cecal mass, extensive hepatic metastatic disease; mild thoracic and abdominal adenopathy. She also has several small nonspecific pulmonary nodules. She was diagnosed in March 2021. Colonoscopy 09/16/2019-fungating mass at the cecum, sessile polyps in the this sigmoid, transverse colon, and hepatic flexure, adenocarcinoma involving cecum biopsy, tubular adenoma and sessile serrated polyps CTs chest, abdomen, and pelvis 10/08/2019-extensive liver metastases, thoracic/abdominal adenopathy, cecal mass, dilated and thin thickened appendix, nonspecific pulmonary nodules Ultrasound-guided liver biopsy 10/13/2019-adenocarcinoma  consistent with a metastatic colorectal primary, MSS, tumor mutation burden 3, KRAS Q61, Cycle 1 FOLFOX 10/14/2019 Cycle 2 FOLFOX 10/27/2019 Cycle 3 FOLFOX 11/11/2019, Emend and Decadron added, 5-FU dose escalated Cycle 4 FOLFOX 11/25/2019, Udenyca added Cycle 5 FOLFOX 12/08/2019 Cycle 6 FOLFOX 12/23/2019 CT 01/04/2020-decreased size of multiple hepatic metastases, slight decrease in bulk of cecal mass, stable ileocecal adenopathy Cycle 7 FOLFOX 01/06/2020 Cycle 8 FOLFOX 01/31/2020 Cycle 9 FOLFOX 02/17/2020 (oxaliplatin dose reduced due to borderline ANC, persistent cold sensitivity) Cycle 10 FOLFOX 03/02/2020 Cycle 11 FOLFOX 03/16/2020 Cycle 12 FOLFOX 03/30/2020 (oxaliplatin held secondary to neuropathy) CT abdomen/pelvis 04/19/2020-no significant change in the abnormal soft tissue lesion in the region of the cecal tip involving the ileocecal valve and appendiceal orifice; slight interval progression of numerous hepatic metastases; similar appearance of small lymph nodes in the ileocolic mesentery with adjacent irregular focus of soft tissue also stable Cycle 1 FOLFIRI/Avastin 04/25/2020 Cycle 2 FOLFIRI/Avastin 05/11/2020 Cycle 3 FOLFIRI 05/25/2020 (Avastin held due to proteinuria) Cycle 4 FOLFIRI/Avastin 06/07/2020 Cycle 5 FOLFIRI/Avastin 06/22/2020 CTs 07/05/2020-mild improvement in multifocal liver metastases.  Mass at the base of the cecum is stable.  Persistent thickening of the appendix. Cycle 6 FOLFIRI/Avastin 07/05/2020 Cycle 7 FOLFIRI/Avastin 07/20/2020 Cycle 8 FOLFIRI/Avastin 08/03/2020 Cycle 9 FOLFIRI 09/05/2020, Avastin held secondary to proteinuria CT chest 09/05/2020-negative for pulmonary embolism, no acute disease, unchanged small pulmonary nodules Cycle 10 FOLFIRI 09/20/2020, Avastin held secondary to proteinuria Cycle 11 FOLFIRI 10/04/2020, Avastin held secondary to proteinuria CTs 10/17/2020-increase in size of liver metastases, stable cecal mass, slight decrease in size of right lower quadrant  mesenteric lymph node Cycle 12 FOLFIRI 10/18/2020, Avastin held secondary to proteinuria Cycle 1 FOLFOX/Avastin 11/13/2020 Cycle 2 FOLFOX/Avastin 11/27/2020 Cycle  3 FOLFOX/Avastin 12/12/2020 Cycle 4 FOLFOX/Avastin 12/26/2020 2) Iron deficiency anemia secondary to #1.  3.  Pain secondary to #1-improved 4.  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  Diarrhea 01/20/2020.  C. difficile negative 7.  COVID-19 infection 08/13/2020, antibody infusion therapy on 08/19/2020 8.  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  Inflamed left labial cyst 09/29/2020-doxycycline  Disposition: Ms. Koepke appears stable.  She has completed 3 cycles of FOLFOX/Avastin.  Plan to proceed with cycle 4 today as scheduled.  Restaging CTs prior to next office visit.  We discussed the further elevation of the CEA from 2 weeks ago.  We will follow-up on the value from today.  CBC from today reviewed.  Counts adequate to proceed with treatment.  She has persistent mild hypokalemia likely due to combination of hydrochlorothiazide and loose stools.  She will increase oral potassium from 40 mEq daily to 60 mEq daily.  24-hour urine for total protein from yesterday reviewed.  I spoke with the lab technician.  A value could not be calculated due to urine protein less than 6 mg/dL.  Blood pressure is elevated.  She did not take hypertensive medications this morning.  We will repeat BP prior to Avastin.  She will return for follow-up in 2 weeks.  She will contact the office in the interim with any problems.  Plan reviewed with Dr. Benay Spice.    Ned Card ANP/GNP-BC   12/26/2020  8:57 AM

## 2020-12-26 NOTE — Progress Notes (Signed)
Patient complained of itching to bilateral palms with redness. Treatment paused. Rachel Haw, NP ordered Benadryl 50mg  IV administered and treatment restarted if itching resolves. Patient denied any other complaints or concerns, she tolerated the rest of the infusion with no complain of itching.

## 2020-12-26 NOTE — Patient Instructions (Signed)
[facility]  Discharge Instructions: Thank you for choosing [facility] to provide your oncology and hematology care.   If you have a lab appointment with the [facility], please go directly to the [facility] and check in at the registration area.   Wear comfortable clothing and clothing appropriate for easy access to any Portacath or PICC line.   We strive to give you quality time with your provider. You may need to reschedule your appointment if you arrive late (15 or more minutes).  Arriving late affects you and other patients whose appointments are after yours.  Also, if you miss three or more appointments without notifying the office, you may be dismissed from the clinic at the provider's discretion.      For prescription refill requests, have your pharmacy contact our office and allow 72 hours for refills to be completed.    Today you received the following chemotherapy and/or immunotherapy agents Avastin, Leucovorin, Oxaliplatin, and 5FU      To help prevent nausea and vomiting after your treatment, we encourage you to take your nausea medication as directed.  BELOW ARE SYMPTOMS THAT SHOULD BE REPORTED IMMEDIATELY: *FEVER GREATER THAN 100.4 F (38 C) OR HIGHER *CHILLS OR SWEATING *NAUSEA AND VOMITING THAT IS NOT CONTROLLED WITH YOUR NAUSEA MEDICATION *UNUSUAL SHORTNESS OF BREATH *UNUSUAL BRUISING OR BLEEDING *URINARY PROBLEMS (pain or burning when urinating, or frequent urination) *BOWEL PROBLEMS (unusual diarrhea, constipation, pain near the anus) TENDERNESS IN MOUTH AND THROAT WITH OR WITHOUT PRESENCE OF ULCERS (sore throat, sores in mouth, or a toothache) UNUSUAL RASH, SWELLING OR PAIN  UNUSUAL VAGINAL DISCHARGE OR ITCHING   Items with * indicate a potential emergency and should be followed up as soon as possible or go to the Emergency Department if any problems should occur.  Please show the CHEMOTHERAPY ALERT CARD or  IMMUNOTHERAPY ALERT CARD at check-in to the Emergency Department and triage nurse.  Should you have questions after your visit or need to cancel or reschedule your appointment, please contact [facility]  Dept: [phone]  and follow the prompts.  Office hours are 8:00 a.m. to 4:30 p.m. Monday - Friday. Please note that voicemails left after 4:00 p.m. may not be returned until the following business day.  We are closed weekends and major holidays. You have access to a nurse at all times for urgent questions. Please call the main number to the clinic Dept: [phone] and follow the prompts.   For any non-urgent questions, you may also contact your provider using MyChart. We now offer e-Visits for anyone 29 and older to request care online for non-urgent symptoms. For details visit [email or web address].   Also download the MyChart app! Go to the app store, search "MyChart", open the app, select [facility], and log in with your MyChart username and password.  Due to Covid, a mask is required upon entering the hospital/clinic. If you do not have a mask, one will be given to you upon arrival. For doctor visits, patients may have 1 support person aged 4 or older with them. For treatment visits, patients cannot have anyone with them due to current Covid guidelines and our immunocompromised population.   Bevacizumab injection What is this medicine? BEVACIZUMAB (be va SIZ yoo mab) is a monoclonal antibody. It is used to treat many types of cancer. This medicine may be used for other purposes; ask your health care provider or pharmacist if you have questions. COMMON  BRAND NAME(S): Avastin, MVASI, Zirabev What should I tell my health care provider before I take this medicine? They need to know if you have any of these conditions: diabetes heart disease high blood pressure history of coughing up blood prior anthracycline chemotherapy (e.g., doxorubicin, daunorubicin,  epirubicin) recent or ongoing radiation therapy recent or planning to have surgery stroke an unusual or allergic reaction to bevacizumab, hamster proteins, mouse proteins, other medicines, foods, dyes, or preservatives pregnant or trying to get pregnant breast-feeding How should I use this medicine? This medicine is for infusion into a vein. It is given by a health care professional in a hospital or clinic setting. Talk to your pediatrician regarding the use of this medicine in children. Special care may be needed. Overdosage: If you think you have taken too much of this medicine contact a poison control center or emergency room at once. NOTE: This medicine is only for you. Do not share this medicine with others. What if I miss a dose? It is important not to miss your dose. Call your doctor or health care professional if you are unable to keep an appointment. What may interact with this medicine? Interactions are not expected. This list may not describe all possible interactions. Give your health care provider a list of all the medicines, herbs, non-prescription drugs, or dietary supplements you use. Also tell them if you smoke, drink alcohol, or use illegal drugs. Some items may interact with your medicine. What should I watch for while using this medicine? Your condition will be monitored carefully while you are receiving this medicine. You will need important blood work and urine testing done while you are taking this medicine. This medicine may increase your risk to bruise or bleed. Call your doctor or health care professional if you notice any unusual bleeding. Before having surgery, talk to your health care provider to make sure it is ok. This drug can increase the risk of poor healing of your surgical site or wound. You will need to stop this drug for 28 days before surgery. After surgery, wait at least 28 days before restarting this drug. Make sure the surgical site or wound is healed  enough before restarting this drug. Talk to your health care provider if questions. Do not become pregnant while taking this medicine or for 6 months after stopping it. Women should inform their doctor if they wish to become pregnant or think they might be pregnant. There is a potential for serious side effects to an unborn child. Talk to your health care professional or pharmacist for more information. Do not breast-feed an infant while taking this medicine and for 6 months after the last dose. This medicine has caused ovarian failure in some women. This medicine may interfere with the ability to have a child. You should talk to your doctor or health care professional if you are concerned about your fertility. What side effects may I notice from receiving this medicine? Side effects that you should report to your doctor or health care professional as soon as possible: allergic reactions like skin rash, itching or hives, swelling of the face, lips, or tongue chest pain or chest tightness chills coughing up blood high fever seizures severe constipation signs and symptoms of bleeding such as bloody or black, tarry stools; red or dark-brown urine; spitting up blood or brown material that looks like coffee grounds; red spots on the skin; unusual bruising or bleeding from the eye, gums, or nose signs and symptoms of a blood clot  such as breathing problems; chest pain; severe, sudden headache; pain, swelling, warmth in the leg signs and symptoms of a stroke like changes in vision; confusion; trouble speaking or understanding; severe headaches; sudden numbness or weakness of the face, arm or leg; trouble walking; dizziness; loss of balance or coordination stomach pain sweating swelling of legs or ankles vomiting weight gain Side effects that usually do not require medical attention (report to your doctor or health care professional if they continue or are bothersome): back pain changes in  taste decreased appetite dry skin nausea tiredness This list may not describe all possible side effects. Call your doctor for medical advice about side effects. You may report side effects to FDA at 1-800-FDA-1088. Where should I keep my medicine? This drug is given in a hospital or clinic and will not be stored at home. NOTE: This sheet is a summary. It may not cover all possible information. If you have questions about this medicine, talk to your doctor, pharmacist, or health care provider.  2021 Elsevier/Gold Standard (2019-04-28 10:50:46)  Oxaliplatin Injection What is this medicine? OXALIPLATIN (ox AL i PLA tin) is a chemotherapy drug. It targets fast dividing cells, like cancer cells, and causes these cells to die. This medicine is used to treat cancers of the colon and rectum, and many other cancers. This medicine may be used for other purposes; ask your health care provider or pharmacist if you have questions. COMMON BRAND NAME(S): Eloxatin What should I tell my health care provider before I take this medicine? They need to know if you have any of these conditions: heart disease history of irregular heartbeat liver disease low blood counts, like white cells, platelets, or red blood cells lung or breathing disease, like asthma take medicines that treat or prevent blood clots tingling of the fingers or toes, or other nerve disorder an unusual or allergic reaction to oxaliplatin, other chemotherapy, other medicines, foods, dyes, or preservatives pregnant or trying to get pregnant breast-feeding How should I use this medicine? This drug is given as an infusion into a vein. It is administered in a hospital or clinic by a specially trained health care professional. Talk to your pediatrician regarding the use of this medicine in children. Special care may be needed. Overdosage: If you think you have taken too much of this medicine contact a poison control center or emergency room at  once. NOTE: This medicine is only for you. Do not share this medicine with others. What if I miss a dose? It is important not to miss a dose. Call your doctor or health care professional if you are unable to keep an appointment. What may interact with this medicine? Do not take this medicine with any of the following medications: cisapride dronedarone pimozide thioridazine This medicine may also interact with the following medications: aspirin and aspirin-like medicines certain medicines that treat or prevent blood clots like warfarin, apixaban, dabigatran, and rivaroxaban cisplatin cyclosporine diuretics medicines for infection like acyclovir, adefovir, amphotericin B, bacitracin, cidofovir, foscarnet, ganciclovir, gentamicin, pentamidine, vancomycin NSAIDs, medicines for pain and inflammation, like ibuprofen or naproxen other medicines that prolong the QT interval (an abnormal heart rhythm) pamidronate zoledronic acid This list may not describe all possible interactions. Give your health care provider a list of all the medicines, herbs, non-prescription drugs, or dietary supplements you use. Also tell them if you smoke, drink alcohol, or use illegal drugs. Some items may interact with your medicine. What should I watch for while using this medicine? Your condition  will be monitored carefully while you are receiving this medicine. You may need blood work done while you are taking this medicine. This medicine may make you feel generally unwell. This is not uncommon as chemotherapy can affect healthy cells as well as cancer cells. Report any side effects. Continue your course of treatment even though you feel ill unless your healthcare professional tells you to stop. This medicine can make you more sensitive to cold. Do not drink cold drinks or use ice. Cover exposed skin before coming in contact with cold temperatures or cold objects. When out in cold weather wear warm clothing and cover  your mouth and nose to warm the air that goes into your lungs. Tell your doctor if you get sensitive to the cold. Do not become pregnant while taking this medicine or for 9 months after stopping it. Women should inform their health care professional if they wish to become pregnant or think they might be pregnant. Men should not father a child while taking this medicine and for 6 months after stopping it. There is potential for serious side effects to an unborn child. Talk to your health care professional for more information. Do not breast-feed a child while taking this medicine or for 3 months after stopping it. This medicine has caused ovarian failure in some women. This medicine may make it more difficult to get pregnant. Talk to your health care professional if you are concerned about your fertility. This medicine has caused decreased sperm counts in some men. This may make it more difficult to father a child. Talk to your health care professional if you are concerned about your fertility. This medicine may increase your risk of getting an infection. Call your health care professional for advice if you get a fever, chills, or sore throat, or other symptoms of a cold or flu. Do not treat yourself. Try to avoid being around people who are sick. Avoid taking medicines that contain aspirin, acetaminophen, ibuprofen, naproxen, or ketoprofen unless instructed by your health care professional. These medicines may hide a fever. Be careful brushing or flossing your teeth or using a toothpick because you may get an infection or bleed more easily. If you have any dental work done, tell your dentist you are receiving this medicine. What side effects may I notice from receiving this medicine? Side effects that you should report to your doctor or health care professional as soon as possible: allergic reactions like skin rash, itching or hives, swelling of the face, lips, or tongue breathing problems cough low  blood counts - this medicine may decrease the number of white blood cells, red blood cells, and platelets. You may be at increased risk for infections and bleeding nausea, vomiting pain, redness, or irritation at site where injected pain, tingling, numbness in the hands or feet signs and symptoms of bleeding such as bloody or black, tarry stools; red or dark brown urine; spitting up blood or brown material that looks like coffee grounds; red spots on the skin; unusual bruising or bleeding from the eyes, gums, or nose signs and symptoms of a dangerous change in heartbeat or heart rhythm like chest pain; dizziness; fast, irregular heartbeat; palpitations; feeling faint or lightheaded; falls signs and symptoms of infection like fever; chills; cough; sore throat; pain or trouble passing urine signs and symptoms of liver injury like dark yellow or brown urine; general ill feeling or flu-like symptoms; light-colored stools; loss of appetite; nausea; right upper belly pain; unusually weak or tired; yellowing  of the eyes or skin signs and symptoms of low red blood cells or anemia such as unusually weak or tired; feeling faint or lightheaded; falls signs and symptoms of muscle injury like dark urine; trouble passing urine or change in the amount of urine; unusually weak or tired; muscle pain; back pain Side effects that usually do not require medical attention (report to your doctor or health care professional if they continue or are bothersome): changes in taste diarrhea gas hair loss loss of appetite mouth sores This list may not describe all possible side effects. Call your doctor for medical advice about side effects. You may report side effects to FDA at 1-800-FDA-1088. Where should I keep my medicine? This drug is given in a hospital or clinic and will not be stored at home. NOTE: This sheet is a summary. It may not cover all possible information. If you have questions about this medicine, talk to  your doctor, pharmacist, or health care provider.  2021 Elsevier/Gold Standard (2018-11-18 12:20:35)  Leucovorin injection What is this medicine? LEUCOVORIN (loo koe VOR in) is used to prevent or treat the harmful effects of some medicines. This medicine is used to treat anemia caused by a low amount of folic acid in the body. It is also used with 5-fluorouracil (5-FU) to treat colon cancer. This medicine may be used for other purposes; ask your health care provider or pharmacist if you have questions. What should I tell my health care provider before I take this medicine? They need to know if you have any of these conditions: anemia from low levels of vitamin B-12 in the blood an unusual or allergic reaction to leucovorin, folic acid, other medicines, foods, dyes, or preservatives pregnant or trying to get pregnant breast-feeding How should I use this medicine? This medicine is for injection into a muscle or into a vein. It is given by a health care professional in a hospital or clinic setting. Talk to your pediatrician regarding the use of this medicine in children. Special care may be needed. Overdosage: If you think you have taken too much of this medicine contact a poison control center or emergency room at once. NOTE: This medicine is only for you. Do not share this medicine with others. What if I miss a dose? This does not apply. What may interact with this medicine? capecitabine fluorouracil phenobarbital phenytoin primidone trimethoprim-sulfamethoxazole This list may not describe all possible interactions. Give your health care provider a list of all the medicines, herbs, non-prescription drugs, or dietary supplements you use. Also tell them if you smoke, drink alcohol, or use illegal drugs. Some items may interact with your medicine. What should I watch for while using this medicine? Your condition will be monitored carefully while you are receiving this medicine. This  medicine may increase the side effects of 5-fluorouracil, 5-FU. Tell your doctor or health care professional if you have diarrhea or mouth sores that do not get better or that get worse. What side effects may I notice from receiving this medicine? Side effects that you should report to your doctor or health care professional as soon as possible: allergic reactions like skin rash, itching or hives, swelling of the face, lips, or tongue breathing problems fever, infection mouth sores unusual bleeding or bruising unusually weak or tired Side effects that usually do not require medical attention (report to your doctor or health care professional if they continue or are bothersome): constipation or diarrhea loss of appetite nausea, vomiting This list may not  describe all possible side effects. Call your doctor for medical advice about side effects. You may report side effects to FDA at 1-800-FDA-1088. Where should I keep my medicine? This drug is given in a hospital or clinic and will not be stored at home. NOTE: This sheet is a summary. It may not cover all possible information. If you have questions about this medicine, talk to your doctor, pharmacist, or health care provider.  2021 Elsevier/Gold Standard (2008-01-05 16:50:29)  Fluorouracil, 5-FU injection What is this medicine? FLUOROURACIL, 5-FU (flure oh YOOR a sil) is a chemotherapy drug. It slows the growth of cancer cells. This medicine is used to treat many types of cancer like breast cancer, colon or rectal cancer, pancreatic cancer, and stomach cancer. This medicine may be used for other purposes; ask your health care provider or pharmacist if you have questions. COMMON BRAND NAME(S): Adrucil What should I tell my health care provider before I take this medicine? They need to know if you have any of these conditions: blood disorders dihydropyrimidine dehydrogenase (DPD) deficiency infection (especially a virus infection such as  chickenpox, cold sores, or herpes) kidney disease liver disease malnourished, poor nutrition recent or ongoing radiation therapy an unusual or allergic reaction to fluorouracil, other chemotherapy, other medicines, foods, dyes, or preservatives pregnant or trying to get pregnant breast-feeding How should I use this medicine? This drug is given as an infusion or injection into a vein. It is administered in a hospital or clinic by a specially trained health care professional. Talk to your pediatrician regarding the use of this medicine in children. Special care may be needed. Overdosage: If you think you have taken too much of this medicine contact a poison control center or emergency room at once. NOTE: This medicine is only for you. Do not share this medicine with others. What if I miss a dose? It is important not to miss your dose. Call your doctor or health care professional if you are unable to keep an appointment. What may interact with this medicine? Do not take this medicine with any of the following medications: live virus vaccines This medicine may also interact with the following medications: medicines that treat or prevent blood clots like warfarin, enoxaparin, and dalteparin This list may not describe all possible interactions. Give your health care provider a list of all the medicines, herbs, non-prescription drugs, or dietary supplements you use. Also tell them if you smoke, drink alcohol, or use illegal drugs. Some items may interact with your medicine. What should I watch for while using this medicine? Visit your doctor for checks on your progress. This drug may make you feel generally unwell. This is not uncommon, as chemotherapy can affect healthy cells as well as cancer cells. Report any side effects. Continue your course of treatment even though you feel ill unless your doctor tells you to stop. In some cases, you may be given additional medicines to help with side effects.  Follow all directions for their use. Call your doctor or health care professional for advice if you get a fever, chills or sore throat, or other symptoms of a cold or flu. Do not treat yourself. This drug decreases your body's ability to fight infections. Try to avoid being around people who are sick. This medicine may increase your risk to bruise or bleed. Call your doctor or health care professional if you notice any unusual bleeding. Be careful brushing and flossing your teeth or using a toothpick because you may get an infection  or bleed more easily. If you have any dental work done, tell your dentist you are receiving this medicine. Avoid taking products that contain aspirin, acetaminophen, ibuprofen, naproxen, or ketoprofen unless instructed by your doctor. These medicines may hide a fever. Do not become pregnant while taking this medicine. Women should inform their doctor if they wish to become pregnant or think they might be pregnant. There is a potential for serious side effects to an unborn child. Talk to your health care professional or pharmacist for more information. Do not breast-feed an infant while taking this medicine. Men should inform their doctor if they wish to father a child. This medicine may lower sperm counts. Do not treat diarrhea with over the counter products. Contact your doctor if you have diarrhea that lasts more than 2 days or if it is severe and watery. This medicine can make you more sensitive to the sun. Keep out of the sun. If you cannot avoid being in the sun, wear protective clothing and use sunscreen. Do not use sun lamps or tanning beds/booths. What side effects may I notice from receiving this medicine? Side effects that you should report to your doctor or health care professional as soon as possible: allergic reactions like skin rash, itching or hives, swelling of the face, lips, or tongue low blood counts - this medicine may decrease the number of white blood  cells, red blood cells and platelets. You may be at increased risk for infections and bleeding. signs of infection - fever or chills, cough, sore throat, pain or difficulty passing urine signs of decreased platelets or bleeding - bruising, pinpoint red spots on the skin, black, tarry stools, blood in the urine signs of decreased red blood cells - unusually weak or tired, fainting spells, lightheadedness breathing problems changes in vision chest pain mouth sores nausea and vomiting pain, swelling, redness at site where injected pain, tingling, numbness in the hands or feet redness, swelling, or sores on hands or feet stomach pain unusual bleeding Side effects that usually do not require medical attention (report to your doctor or health care professional if they continue or are bothersome): changes in finger or toe nails diarrhea dry or itchy skin hair loss headache loss of appetite sensitivity of eyes to the light stomach upset unusually teary eyes This list may not describe all possible side effects. Call your doctor for medical advice about side effects. You may report side effects to FDA at 1-800-FDA-1088. Where should I keep my medicine? This drug is given in a hospital or clinic and will not be stored at home. NOTE: This sheet is a summary. It may not cover all possible information. If you have questions about this medicine, talk to your doctor, pharmacist, or health care provider.  2021 Elsevier/Gold Standard (2019-06-01 15:00:03)

## 2020-12-28 ENCOUNTER — Other Ambulatory Visit: Payer: Self-pay

## 2020-12-28 ENCOUNTER — Inpatient Hospital Stay: Payer: 59

## 2020-12-28 VITALS — BP 167/98 | HR 77 | Temp 97.8°F | Resp 18

## 2020-12-28 DIAGNOSIS — C19 Malignant neoplasm of rectosigmoid junction: Secondary | ICD-10-CM

## 2020-12-28 DIAGNOSIS — Z5112 Encounter for antineoplastic immunotherapy: Secondary | ICD-10-CM | POA: Diagnosis not present

## 2020-12-28 MED ORDER — SODIUM CHLORIDE 0.9% FLUSH
10.0000 mL | INTRAVENOUS | Status: DC | PRN
  Administered 2020-12-28: 10 mL
  Filled 2020-12-28: qty 10

## 2020-12-28 MED ORDER — HEPARIN SOD (PORK) LOCK FLUSH 100 UNIT/ML IV SOLN
500.0000 [IU] | Freq: Once | INTRAVENOUS | Status: AC | PRN
  Administered 2020-12-28: 500 [IU]
  Filled 2020-12-28: qty 5

## 2020-12-28 MED ORDER — PEGFILGRASTIM-CBQV 6 MG/0.6ML ~~LOC~~ SOSY
6.0000 mg | PREFILLED_SYRINGE | Freq: Once | SUBCUTANEOUS | Status: AC
  Administered 2020-12-28: 6 mg via SUBCUTANEOUS
  Filled 2020-12-28: qty 0.6

## 2020-12-28 NOTE — Patient Instructions (Signed)
Implanted Port Home Guide An implanted port is a device that is placed under the skin. It is usually placed in the chest. The device can be used to give IV medicine, to take blood, or for dialysis. You may have an implanted port if: You need IV medicine that would be irritating to the small veins in your hands or arms. You need IV medicines, such as antibiotics, for a long period of time. You need IV nutrition for a long period of time. You need dialysis. When you have a port, your health care provider can choose to use the port instead of veins in your arms for these procedures. You may have fewer limitations when using a port than you would if you used other types of long-term IVs, and you will likely be able to return to normal activities afteryour incision heals. An implanted port has two main parts: Reservoir. The reservoir is the part where a needle is inserted to give medicines or draw blood. The reservoir is round. After it is placed, it appears as a small, raised area under your skin. Catheter. The catheter is a thin, flexible tube that connects the reservoir to a vein. Medicine that is inserted into the reservoir goes into the catheter and then into the vein. How is my port accessed? To access your port: A numbing cream may be placed on the skin over the port site. Your health care provider will put on a mask and sterile gloves. The skin over your port will be cleaned carefully with a germ-killing soap and allowed to dry. Your health care provider will gently pinch the port and insert a needle into it. Your health care provider will check for a blood return to make sure the port is in the vein and is not clogged. If your port needs to remain accessed to get medicine continuously (constant infusion), your health care provider will place a clear bandage (dressing) over the needle site. The dressing and needle will need to be changed every week, or as told by your health care provider. What  is flushing? Flushing helps keep the port from getting clogged. Follow instructions from your health care provider about how and when to flush the port. Ports are usually flushed with saline solution or a medicine called heparin. The need for flushing will depend on how the port is used: If the port is only used from time to time to give medicines or draw blood, the port may need to be flushed: Before and after medicines have been given. Before and after blood has been drawn. As part of routine maintenance. Flushing may be recommended every 4-6 weeks. If a constant infusion is running, the port may not need to be flushed. Throw away any syringes in a disposal container that is meant for sharp items (sharps container). You can buy a sharps container from a pharmacy, or you can make one by using an empty hard plastic bottle with a cover. How long will my port stay implanted? The port can stay in for as long as your health care provider thinks it is needed. When it is time for the port to come out, a surgery will be done to remove it. The surgery will be similar to the procedure that was done to putthe port in. Follow these instructions at home:  Flush your port as told by your health care provider. If you need an infusion over several days, follow instructions from your health care provider about how to take   care of your port site. Make sure you: Wash your hands with soap and water before you change your dressing. If soap and water are not available, use alcohol-based hand sanitizer. Change your dressing as told by your health care provider. Place any used dressings or infusion bags into a plastic bag. Throw that bag in the trash. Keep the dressing that covers the needle clean and dry. Do not get it wet. Do not use scissors or sharp objects near the tube. Keep the tube clamped, unless it is being used. Check your port site every day for signs of infection. Check for: Redness, swelling, or  pain. Fluid or blood. Pus or a bad smell. Protect the skin around the port site. Avoid wearing bra straps that rub or irritate the site. Protect the skin around your port from seat belts. Place a soft pad over your chest if needed. Bathe or shower as told by your health care provider. The site may get wet as long as you are not actively receiving an infusion. Return to your normal activities as told by your health care provider. Ask your health care provider what activities are safe for you. Carry a medical alert card or wear a medical alert bracelet at all times. This will let health care providers know that you have an implanted port in case of an emergency. Get help right away if: You have redness, swelling, or pain at the port site. You have fluid or blood coming from your port site. You have pus or a bad smell coming from the port site. You have a fever. Summary Implanted ports are usually placed in the chest for long-term IV access. Follow instructions from your health care provider about flushing the port and changing bandages (dressings). Take care of the area around your port by avoiding clothing that puts pressure on the area, and by watching for signs of infection. Protect the skin around your port from seat belts. Place a soft pad over your chest if needed. Get help right away if you have a fever or you have redness, swelling, pain, drainage, or a bad smell at the port site. This information is not intended to replace advice given to you by your health care provider. Make sure you discuss any questions you have with your healthcare provider. Document Revised: 11/15/2019 Document Reviewed: 11/15/2019 Elsevier Patient Education  2022 Elsevier Inc.  

## 2021-01-04 ENCOUNTER — Inpatient Hospital Stay: Payer: 59

## 2021-01-04 ENCOUNTER — Other Ambulatory Visit: Payer: Self-pay

## 2021-01-04 ENCOUNTER — Encounter (HOSPITAL_BASED_OUTPATIENT_CLINIC_OR_DEPARTMENT_OTHER): Payer: Self-pay

## 2021-01-04 ENCOUNTER — Ambulatory Visit (HOSPITAL_BASED_OUTPATIENT_CLINIC_OR_DEPARTMENT_OTHER)
Admission: RE | Admit: 2021-01-04 | Discharge: 2021-01-04 | Disposition: A | Discharge status: Home / Self Care | Payer: 59 | Source: Ambulatory Visit | Attending: Nurse Practitioner | Admitting: Nurse Practitioner

## 2021-01-04 DIAGNOSIS — C19 Malignant neoplasm of rectosigmoid junction: Secondary | ICD-10-CM

## 2021-01-04 DIAGNOSIS — Z5112 Encounter for antineoplastic immunotherapy: Secondary | ICD-10-CM | POA: Diagnosis not present

## 2021-01-04 MED ORDER — IOHEXOL 300 MG/ML  SOLN
75.0000 mL | Freq: Once | INTRAMUSCULAR | Status: AC | PRN
  Administered 2021-01-04: 75 mL via INTRAVENOUS

## 2021-01-07 ENCOUNTER — Other Ambulatory Visit: Payer: Self-pay | Admitting: Oncology

## 2021-01-09 ENCOUNTER — Encounter: Payer: Self-pay | Admitting: Oncology

## 2021-01-09 ENCOUNTER — Encounter: Payer: Self-pay | Admitting: Physician Assistant

## 2021-01-09 ENCOUNTER — Telehealth: Payer: Self-pay | Admitting: Pharmacy Technician

## 2021-01-09 ENCOUNTER — Inpatient Hospital Stay: Payer: 59

## 2021-01-09 ENCOUNTER — Other Ambulatory Visit: Payer: Self-pay

## 2021-01-09 ENCOUNTER — Other Ambulatory Visit (HOSPITAL_COMMUNITY): Payer: Self-pay

## 2021-01-09 ENCOUNTER — Other Ambulatory Visit: Payer: Self-pay | Admitting: Oncology

## 2021-01-09 ENCOUNTER — Inpatient Hospital Stay (HOSPITAL_BASED_OUTPATIENT_CLINIC_OR_DEPARTMENT_OTHER): Payer: 59 | Admitting: Oncology

## 2021-01-09 ENCOUNTER — Telehealth: Payer: Self-pay | Admitting: *Deleted

## 2021-01-09 ENCOUNTER — Telehealth: Payer: Self-pay | Admitting: Pharmacist

## 2021-01-09 VITALS — BP 172/98 | HR 88

## 2021-01-09 VITALS — BP 170/90 | HR 95 | Temp 97.8°F | Resp 18 | Ht 62.0 in | Wt 159.4 lb

## 2021-01-09 DIAGNOSIS — G893 Neoplasm related pain (acute) (chronic): Secondary | ICD-10-CM

## 2021-01-09 DIAGNOSIS — C19 Malignant neoplasm of rectosigmoid junction: Secondary | ICD-10-CM | POA: Diagnosis not present

## 2021-01-09 DIAGNOSIS — Z5112 Encounter for antineoplastic immunotherapy: Secondary | ICD-10-CM | POA: Diagnosis not present

## 2021-01-09 LAB — CBC WITH DIFFERENTIAL (CANCER CENTER ONLY)
Abs Immature Granulocytes: 0.06 10*3/uL (ref 0.00–0.07)
Basophils Absolute: 0.1 10*3/uL (ref 0.0–0.1)
Basophils Relative: 1 %
Eosinophils Absolute: 0.1 10*3/uL (ref 0.0–0.5)
Eosinophils Relative: 1 %
HCT: 37 % (ref 36.0–46.0)
Hemoglobin: 11.9 g/dL — ABNORMAL LOW (ref 12.0–15.0)
Immature Granulocytes: 1 %
Lymphocytes Relative: 22 %
Lymphs Abs: 2 10*3/uL (ref 0.7–4.0)
MCH: 27 pg (ref 26.0–34.0)
MCHC: 32.2 g/dL (ref 30.0–36.0)
MCV: 83.9 fL (ref 80.0–100.0)
Monocytes Absolute: 0.6 10*3/uL (ref 0.1–1.0)
Monocytes Relative: 7 %
Neutro Abs: 6.4 10*3/uL (ref 1.7–7.7)
Neutrophils Relative %: 68 %
Platelet Count: 207 10*3/uL (ref 150–400)
RBC: 4.41 MIL/uL (ref 3.87–5.11)
RDW: 18.6 % — ABNORMAL HIGH (ref 11.5–15.5)
WBC Count: 9.1 10*3/uL (ref 4.0–10.5)
nRBC: 0 % (ref 0.0–0.2)

## 2021-01-09 LAB — CMP (CANCER CENTER ONLY)
ALT: 34 U/L (ref 0–44)
AST: 73 U/L — ABNORMAL HIGH (ref 15–41)
Albumin: 2.7 g/dL — ABNORMAL LOW (ref 3.5–5.0)
Alkaline Phosphatase: 1093 U/L — ABNORMAL HIGH (ref 38–126)
Anion gap: 11 (ref 5–15)
BUN: 8 mg/dL (ref 8–23)
CO2: 25 mmol/L (ref 22–32)
Calcium: 8.4 mg/dL — ABNORMAL LOW (ref 8.9–10.3)
Chloride: 101 mmol/L (ref 98–111)
Creatinine: 0.31 mg/dL — ABNORMAL LOW (ref 0.44–1.00)
GFR, Estimated: >60 mL/min (ref >60)
Glucose, Bld: 108 mg/dL — ABNORMAL HIGH (ref 70–99)
Potassium: 3.4 mmol/L — ABNORMAL LOW (ref 3.5–5.1)
Sodium: 137 mmol/L (ref 135–145)
Total Bilirubin: 0.8 mg/dL (ref 0.3–1.2)
Total Protein: 6.2 g/dL — ABNORMAL LOW (ref 6.5–8.1)

## 2021-01-09 LAB — MAGNESIUM: Magnesium: 1.5 mg/dL — ABNORMAL LOW (ref 1.7–2.4)

## 2021-01-09 LAB — CEA (ACCESS): CEA (CHCC): 20814.64 ng/mL — ABNORMAL HIGH (ref 0.00–5.00)

## 2021-01-09 MED ORDER — HEPARIN SOD (PORK) LOCK FLUSH 100 UNIT/ML IV SOLN
500.0000 [IU] | Freq: Once | INTRAVENOUS | Status: AC | PRN
  Administered 2021-01-09: 500 [IU]
  Filled 2021-01-09: qty 5

## 2021-01-09 MED ORDER — DIPHENOXYLATE-ATROPINE 2.5-0.025 MG PO TABS: 1.0000 | ORAL_TABLET | Freq: Four times a day (QID) | ORAL | 0 refills | Status: DC | PRN

## 2021-01-09 MED ORDER — OXYCODONE HCL 5 MG PO TABS: 5.0000 mg | ORAL_TABLET | Freq: Four times a day (QID) | ORAL | 0 refills | Status: DC | PRN

## 2021-01-09 MED ORDER — SODIUM CHLORIDE 0.9 % IV SOLN
5.0000 mg/kg | Freq: Once | INTRAVENOUS | Status: AC
  Administered 2021-01-09: 350 mg via INTRAVENOUS
  Filled 2021-01-09: qty 14

## 2021-01-09 MED ORDER — SODIUM CHLORIDE 0.9 % IV SOLN
Freq: Once | INTRAVENOUS | Status: AC
Start: 2021-01-09 — End: 2021-01-09
  Filled 2021-01-09: qty 250

## 2021-01-09 MED ORDER — LONSURF 20-8.19 MG PO TABS
ORAL_TABLET | ORAL | 0 refills | Status: DC
  Filled 2021-01-09: qty 60, fill #0

## 2021-01-09 MED ORDER — LONSURF 20-8.19 MG PO TABS: 35.0000 mg/m2 | ORAL_TABLET | Freq: Two times a day (BID) | ORAL | 0 refills | Status: DC

## 2021-01-09 MED ORDER — SODIUM CHLORIDE 0.9% FLUSH
10.0000 mL | INTRAVENOUS | Status: DC | PRN
  Administered 2021-01-09: 10 mL
  Filled 2021-01-09: qty 10

## 2021-01-09 NOTE — Telephone Encounter (Signed)
Oral Oncology Patient Advocate Encounter   Received notification from [employer] that prior authorization for Lonsurf is required.   PA submitted on CoverMyMeds Key [id number]  Status is pending   Oral Oncology Clinic will continue to follow.  [place] Patient [place] Phone [phone] Fax [phone] 01/09/2021 3:07 PM

## 2021-01-09 NOTE — Patient Instructions (Signed)
[facility]   Discharge Instructions: Thank you for choosing [facility] to provide your oncology and hematology care.   If you have a lab appointment with the Cancer Center, please go directly to the Cancer Center and check in at the registration area.   Wear comfortable clothing and clothing appropriate for easy access to any Portacath or PICC line.   We strive to give you quality time with your provider. You may need to reschedule your appointment if you arrive late (15 or more minutes).  Arriving late affects you and other patients whose appointments are after yours.  Also, if you miss three or more appointments without notifying the office, you may be dismissed from the clinic at the provider's discretion.      For prescription refill requests, have your pharmacy contact our office and allow 72 hours for refills to be completed.    Today you received the following chemotherapy and/or immunotherapy agents Bevacizumab-bvzr (ZIRABEV).      To help prevent nausea and vomiting after your treatment, we encourage you to take your nausea medication as directed.  BELOW ARE SYMPTOMS THAT SHOULD BE REPORTED IMMEDIATELY: *FEVER GREATER THAN 100.4 F (38 C) OR HIGHER *CHILLS OR SWEATING *NAUSEA AND VOMITING THAT IS NOT CONTROLLED WITH YOUR NAUSEA MEDICATION *UNUSUAL SHORTNESS OF BREATH *UNUSUAL BRUISING OR BLEEDING *URINARY PROBLEMS (pain or burning when urinating, or frequent urination) *BOWEL PROBLEMS (unusual diarrhea, constipation, pain near the anus) TENDERNESS IN MOUTH AND THROAT WITH OR WITHOUT PRESENCE OF ULCERS (sore throat, sores in mouth, or a toothache) UNUSUAL RASH, SWELLING OR PAIN  UNUSUAL VAGINAL DISCHARGE OR ITCHING   Items with * indicate a potential emergency and should be followed up as soon as possible or go to the Emergency Department if any problems should occur.  Please show the CHEMOTHERAPY ALERT CARD or IMMUNOTHERAPY ALERT CARD  at check-in to the Emergency Department and triage nurse.  Should you have questions after your visit or need to cancel or reschedule your appointment, please contact [facility]  Dept: [phone]  and follow the prompts.  Office hours are 8:00 a.m. to 4:30 p.m. Monday - Friday. Please note that voicemails left after 4:00 p.m. may not be returned until the following business day.  We are closed weekends and major holidays. You have access to a nurse at all times for urgent questions. Please call the main number to the clinic Dept: [phone] and follow the prompts.   For any non-urgent questions, you may also contact your provider using MyChart. We now offer e-Visits for anyone 18 and older to request care online for non-urgent symptoms. For details visit [email or web address].   Also download the MyChart app! Go to the app store, search "MyChart", open the app, select [facility], and log in with your MyChart username and password.  Due to Covid, a mask is required upon entering the hospital/clinic. If you do not have a mask, one will be given to you upon arrival. For doctor visits, patients may have 1 support person aged 18 or older with them. For treatment visits, patients cannot have anyone with them due to current Covid guidelines and our immunocompromised population.   Bevacizumab injection What is this medication? BEVACIZUMAB (be va SIZ yoo mab) is a monoclonal antibody. It is used to treat many types of cancer. This medicine may be used for other purposes; ask your health care provider or pharmacist if you have questions. COMMON BRAND NAME(S):   Avastin, MVASI, Zirabev What should I tell my care team before I take this medication? They need to know if you have any of these conditions: diabetes heart disease high blood pressure history of coughing up blood prior anthracycline chemotherapy (e.g., doxorubicin, daunorubicin, epirubicin) recent or ongoing  radiation therapy recent or planning to have surgery stroke an unusual or allergic reaction to bevacizumab, hamster proteins, mouse proteins, other medicines, foods, dyes, or preservatives pregnant or trying to get pregnant breast-feeding How should I use this medication? This medicine is for infusion into a vein. It is given by a health care professional in a hospital or clinic setting. Talk to your pediatrician regarding the use of this medicine in children. Special care may be needed. Overdosage: If you think you have taken too much of this medicine contact a poison control center or emergency room at once. NOTE: This medicine is only for you. Do not share this medicine with others. What if I miss a dose? It is important not to miss your dose. Call your doctor or health care professional if you are unable to keep an appointment. What may interact with this medication? Interactions are not expected. This list may not describe all possible interactions. Give your health care provider a list of all the medicines, herbs, non-prescription drugs, or dietary supplements you use. Also tell them if you smoke, drink alcohol, or use illegal drugs. Some items may interact with your medicine. What should I watch for while using this medication? Your condition will be monitored carefully while you are receiving this medicine. You will need important blood work and urine testing done while you are taking this medicine. This medicine may increase your risk to bruise or bleed. Call your doctor or health care professional if you notice any unusual bleeding. Before having surgery, talk to your health care provider to make sure it is ok. This drug can increase the risk of poor healing of your surgical site or wound. You will need to stop this drug for 28 days before surgery. After surgery, wait at least 28 days before restarting this drug. Make sure the surgical site or wound is healed enough before restarting  this drug. Talk to your health care provider if questions. Do not become pregnant while taking this medicine or for 6 months after stopping it. Women should inform their doctor if they wish to become pregnant or think they might be pregnant. There is a potential for serious side effects to an unborn child. Talk to your health care professional or pharmacist for more information. Do not breast-feed an infant while taking this medicine and for 6 months after the last dose. This medicine has caused ovarian failure in some women. This medicine may interfere with the ability to have a child. You should talk to your doctor or health care professional if you are concerned about your fertility. What side effects may I notice from receiving this medication? Side effects that you should report to your doctor or health care professional as soon as possible: allergic reactions like skin rash, itching or hives, swelling of the face, lips, or tongue chest pain or chest tightness chills coughing up blood high fever seizures severe constipation signs and symptoms of bleeding such as bloody or black, tarry stools; red or dark-brown urine; spitting up blood or brown material that looks like coffee grounds; red spots on the skin; unusual bruising or bleeding from the eye, gums, or nose signs and symptoms of a blood clot such as breathing   problems; chest pain; severe, sudden headache; pain, swelling, warmth in the leg signs and symptoms of a stroke like changes in vision; confusion; trouble speaking or understanding; severe headaches; sudden numbness or weakness of the face, arm or leg; trouble walking; dizziness; loss of balance or coordination stomach pain sweating swelling of legs or ankles vomiting weight gain Side effects that usually do not require medical attention (report to your doctor or health care professional if they continue or are bothersome): back pain changes in taste decreased appetite dry  skin nausea tiredness This list may not describe all possible side effects. Call your doctor for medical advice about side effects. You may report side effects to FDA at 1-800-FDA-1088. Where should I keep my medication? This drug is given in a hospital or clinic and will not be stored at home. NOTE: This sheet is a summary. It may not cover all possible information. If you have questions about this medicine, talk to your doctor, pharmacist, or health care provider.  2022 Elsevier/Gold Standard (2019-04-28 10:50:46)  

## 2021-01-09 NOTE — Telephone Encounter (Signed)
-----   Message from Ladell Pier, MD sent at 01/09/2021  3:36 PM EDT ----- Please call patient, potassium and magnesium are low, please ask her to return for 3 g of IV magnesium sulfate within the next few days, continue oral potassium, add magnesium to labs on 01/23/2021

## 2021-01-09 NOTE — Telephone Encounter (Signed)
Left VM for patient that Mg+ and K+ are low. Needs to continue oral K+ consistently and come in this week for IV Mg+, which will take ~ 1.5 hours (MD wants 3 grams). High priority scheduling message sent.

## 2021-01-09 NOTE — Telephone Encounter (Signed)
Oral Chemotherapy Pharmacist Encounter  Patient Education I spoke with patient for overview of new oral chemotherapy medication: Lonsurf (trifluridine and tipiracil) for the treatment of metastatic colon cancer in conjunction with bevacizumab, planned duration until disease progression or unacceptable drug toxicity. Planned start 01/15/21.  Counseled patient on administration, dosing, side effects, monitoring, drug-food interactions, safe handling, storage, and disposal. Patient will take 3 tablets (60 mg of trifluridine total) by mouth 2 (two) times daily after a meal. 1 hr after AM & PM meals on days 1-5, 8-12. Repeat every 28day.  Side effects include but not limited to: diarrhea, N/V, decrease wbc/hgb/plt, fatigue.    Reviewed with patient importance of keeping a medication schedule and plan for any missed doses.  After discussion with patient no patient barriers to medication adherence identified.   Rachel Frank voiced understanding and appreciation. All questions answered. Medication handout provided.  Provided patient with [facility] phone number. Patient knows to call the office with questions or concerns. Oral Chemotherapy Navigation Clinic will continue to follow.  Darl Pikes, PharmD, BCPS, BCOP, CPP Hematology/Oncology Clinical Pharmacist Practitioner [facility] [phone]  01/09/2021 4:02 PM

## 2021-01-09 NOTE — Telephone Encounter (Signed)
Oral Chemotherapy Pharmacist Encounter  Due to insurance restriction the medication could not be filled at [facility]. Prescription has been e-scribed to [facility].  Supportive information was faxed to [facility]. We will continue to follow medication access.   Darl Pikes, PharmD, BCPS, [facility] Hematology/Oncology Clinical Pharmacist [facility] [phone]  01/09/2021 2:48 PM

## 2021-01-09 NOTE — Telephone Encounter (Signed)
Oral Oncology Pharmacist Encounter  Received new prescription for Lonsurf (trifluridine and tipiracil) for the treatment of metastatic colon cancer in conjunction with bevacizumab, planned duration until disease progression or unacceptable drug toxicity. Planned start 01/15/21.  CBC/CMP from 01/09/21 assessed, no relevant lab abnormalities. Prescription dose and frequency assessed.   Current medication list in Epic reviewed, no DDIs with Lonsurf identified:  Evaluated chart and no patient barriers to medication adherence identified.   Prescription has been e-scribed to the [facility] for benefits analysis and approval.  Oral Oncology Clinic will continue to follow for insurance authorization, copayment issues, initial counseling and start date.   Darl Pikes, PharmD, BCPS, BCOP, CPP Hematology/Oncology Clinical Pharmacist Practitioner [facility] [phone]  01/09/2021 2:28 PM

## 2021-01-09 NOTE — Progress Notes (Signed)
[place] OFFICE PROGRESS NOTE   Diagnosis: Colon cancer  INTERVAL HISTORY:   Rachel Frank completed another cycle of FOLFOX/Avastin on 12/26/2020.  No mouth sores.  She has mild nausea and diarrhea following chemotherapy.  Stable peripheral numbness.  She has noted increased abdominal distention.  She has abdominal pain and takes oxycodone occasionally.  Objective:  Vital signs in last 24 hours:  Blood pressure (!) 170/90, pulse 95, temperature 97.8 F (36.6 C), temperature source Oral, resp. rate 18, height $RemoveBe'5\' 2"'DjSKbYSjf$  (1.575 m), weight 159 lb 6.4 oz (72.3 kg), SpO2 99 %.    HEENT: No thrush or ulcers, sclera anicteric Resp: Lungs clear bilaterally Cardio: Regular rate and rhythm GI: Distended, the liver edge is palpable in the right upper abdomen Vascular: No leg edema     Portacath/PICC-without erythema  Lab Results:  Lab Results  Component Value Date   WBC 9.1 01/09/2021   HGB 11.9 (L) 01/09/2021   HCT 37.0 01/09/2021   MCV 83.9 01/09/2021   PLT 207 01/09/2021   NEUTROABS 6.4 01/09/2021    CMP  Lab Results  Component Value Date   NA 137 01/09/2021   K 3.4 (L) 01/09/2021   CL 101 01/09/2021   CO2 25 01/09/2021   GLUCOSE 108 (H) 01/09/2021   BUN 8 01/09/2021   CREATININE 0.31 (L) 01/09/2021   CALCIUM 8.4 (L) 01/09/2021   PROT 6.2 (L) 01/09/2021   ALBUMIN 2.7 (L) 01/09/2021   AST 73 (H) 01/09/2021   ALT 34 01/09/2021   ALKPHOS 1,093 (H) 01/09/2021   BILITOT 0.8 01/09/2021   GFRNONAA >60 01/09/2021   GFRAA >60 03/30/2020     Imaging: CT images 01/04/2021 reviewed with Rachel Frank and her daughter  Medications: I have reviewed the patient's current medications.   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 She presented with a large cecal mass, extensive hepatic metastatic disease; mild thoracic and abdominal adenopathy. She also has several small nonspecific pulmonary nodules. She was diagnosed in March 2021. Colonoscopy  09/16/2019-fungating mass at the cecum, sessile polyps in the this sigmoid, transverse colon, and hepatic flexure, adenocarcinoma involving cecum biopsy, tubular adenoma and sessile serrated polyps CTs chest, abdomen, and pelvis 10/08/2019-extensive liver metastases, thoracic/abdominal adenopathy, cecal mass, dilated and thin thickened appendix, nonspecific pulmonary nodules Ultrasound-guided liver biopsy 10/13/2019-adenocarcinoma consistent with a metastatic colorectal primary, MSS, tumor mutation burden 3, KRAS Q61, Cycle 1 FOLFOX 10/14/2019 Cycle 2 FOLFOX 10/27/2019 Cycle 3 FOLFOX 11/11/2019, Emend and Decadron added, 5-FU dose escalated Cycle 4 FOLFOX 11/25/2019, Udenyca added Cycle 5 FOLFOX 12/08/2019 Cycle 6 FOLFOX 12/23/2019 CT 01/04/2020-decreased size of multiple hepatic metastases, slight decrease in bulk of cecal mass, stable ileocecal adenopathy Cycle 7 FOLFOX 01/06/2020 Cycle 8 FOLFOX 01/31/2020 Cycle 9 FOLFOX 02/17/2020 (oxaliplatin dose reduced due to borderline ANC, persistent cold sensitivity) Cycle 10 FOLFOX 03/02/2020 Cycle 11 FOLFOX 03/16/2020 Cycle 12 FOLFOX 03/30/2020 (oxaliplatin held secondary to neuropathy) CT abdomen/pelvis 04/19/2020-no significant change in the abnormal soft tissue lesion in the region of the cecal tip involving the ileocecal valve and appendiceal orifice; slight interval progression of numerous hepatic metastases; similar appearance of small lymph nodes in the ileocolic mesentery with adjacent irregular focus of soft tissue also stable Cycle 1 FOLFIRI/Avastin 04/25/2020 Cycle 2 FOLFIRI/Avastin 05/11/2020 Cycle 3 FOLFIRI 05/25/2020 (Avastin held due to proteinuria) Cycle 4 FOLFIRI/Avastin 06/07/2020 Cycle 5 FOLFIRI/Avastin 06/22/2020 CTs 07/05/2020-mild improvement in multifocal liver metastases.  Mass at the base of the cecum is stable.  Persistent thickening of the appendix.  Cycle 6 FOLFIRI/Avastin 07/05/2020 Cycle 7 FOLFIRI/Avastin 07/20/2020 Cycle 8 FOLFIRI/Avastin  08/03/2020 Cycle 9 FOLFIRI 09/05/2020, Avastin held secondary to proteinuria CT chest 09/05/2020-negative for pulmonary embolism, no acute disease, unchanged small pulmonary nodules Cycle 10 FOLFIRI 09/20/2020, Avastin held secondary to proteinuria Cycle 11 FOLFIRI 10/04/2020, Avastin held secondary to proteinuria CTs 10/17/2020-increase in size of liver metastases, stable cecal mass, slight decrease in size of right lower quadrant mesenteric lymph node Cycle 12 FOLFIRI 10/18/2020, Avastin held secondary to proteinuria Cycle 1 FOLFOX/Avastin 11/13/2020 Cycle 2 FOLFOX/Avastin 11/27/2020 Cycle 3 FOLFOX/Avastin 12/12/2020 Cycle 4 FOLFOX/Avastin 12/26/2020 CT 01/04/2021-bulky confluent liver metastases, some are larger and others are stable, increased ascites with suggestion of peritoneal thickening and nodularity, unchanged cecal mass 01/09/2021-every 2-week Avastin continued Cycle 1 Lonsurf 01/15/2021 2) Iron deficiency anemia secondary to #1.  3.  Pain secondary to #1-improved 4.  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  Diarrhea 01/20/2020.  C. difficile negative 7.  COVID-19 infection 08/13/2020, antibody infusion therapy on 08/19/2020 8.  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  Inflamed left labial cyst 09/29/2020-doxycycline    Disposition: Rachel Frank has metastatic colon cancer.  The restaging CTs are most consistent with disease progression.  I reviewed the CT images with Rachel Frank and her daughter.  Some of the liver lesions appear larger and the overall pattern is consistent with marked involvement of the liver with metastatic colon cancer.  This does not appear improved.  She has increased ascites.  The CEA remains markedly elevated, though it is lower today.  I  discussed treatment options with Rachel Frank.  She would like to continue systemic therapy.  We decided to discontinue FOLFOX.  She will begin treatment on Lonsurf/bevacizumab.  The bilirubin is normal today.  We reviewed potential toxicities associated with Lonsurf including the chance of hematologic toxicity.  The plan is to initiate Lonsurf therapy on 01/15/2021.  A prescription for Frankey Poot was entered today.  I refilled her prescription for oxycodone.  Betsy Coder, MD  01/09/2021  1:22 PM

## 2021-01-09 NOTE — Progress Notes (Signed)
Provided patient with printed information on Lonsuf and process to obtain the medication. She will be called by our oral oncology team.

## 2021-01-10 ENCOUNTER — Encounter: Payer: Self-pay | Admitting: Physician Assistant

## 2021-01-10 ENCOUNTER — Encounter: Payer: Self-pay | Admitting: Oncology

## 2021-01-10 ENCOUNTER — Other Ambulatory Visit: Payer: Self-pay | Admitting: *Deleted

## 2021-01-10 NOTE — Telephone Encounter (Signed)
Oral Oncology Patient Advocate Encounter  Prior Authorization for Rachel Frank has been approved.    PA# [id number] Effective dates: 01/09/21 through 01/08/21  Patient must fill through [facility].  Will follow up for copay and delivery.  Oral Oncology Clinic will continue to follow.   [place] Patient [place] Phone [phone] Fax [phone] 01/10/2021 2:50 PM

## 2021-01-11 ENCOUNTER — Inpatient Hospital Stay: Payer: 59

## 2021-01-11 ENCOUNTER — Other Ambulatory Visit: Payer: Self-pay

## 2021-01-11 VITALS — BP 178/99 | HR 91 | Temp 97.7°F | Resp 18

## 2021-01-11 DIAGNOSIS — D509 Iron deficiency anemia, unspecified: Secondary | ICD-10-CM

## 2021-01-11 DIAGNOSIS — Z5112 Encounter for antineoplastic immunotherapy: Secondary | ICD-10-CM | POA: Diagnosis not present

## 2021-01-11 DIAGNOSIS — C19 Malignant neoplasm of rectosigmoid junction: Secondary | ICD-10-CM

## 2021-01-11 MED ORDER — SODIUM CHLORIDE 0.9 % IV SOLN
Freq: Once | INTRAVENOUS | Status: AC
  Filled 2021-01-11: qty 250

## 2021-01-11 MED ORDER — MAGNESIUM SULFATE 2 GM/50ML IV SOLN
2.0000 g | Freq: Once | INTRAVENOUS | Status: AC
Start: 2021-01-11 — End: 2021-01-11
  Administered 2021-01-11: 2 g via INTRAVENOUS
  Filled 2021-01-11: qty 50

## 2021-01-11 MED ORDER — SODIUM CHLORIDE 0.9% FLUSH
10.0000 mL | Freq: Once | INTRAVENOUS | Status: AC
  Administered 2021-01-11: 10 mL
  Filled 2021-01-11: qty 10

## 2021-01-11 MED ORDER — HEPARIN SOD (PORK) LOCK FLUSH 100 UNIT/ML IV SOLN
250.0000 [IU] | Freq: Once | INTRAVENOUS | Status: AC
Start: 2021-01-11 — End: 2021-01-11
  Administered 2021-01-11: 500 [IU]
  Filled 2021-01-11: qty 5

## 2021-01-11 NOTE — Telephone Encounter (Signed)
Oral Chemotherapy Pharmacist Encounter   Ms. Rasheema Truluck will be delivered by [facility] tomorrow 01/12/21. Ms. Mcelhannon plans on getting started Monday 01/15/21.  Darl Pikes, PharmD, BCPS, BCOP, CPP Hematology/Oncology Clinical Pharmacist [facility]/HP/AP Oral [facility] [phone]  01/11/2021 11:54 AM

## 2021-01-11 NOTE — Telephone Encounter (Signed)
Patient has $0 copay and [facility] will be reaching out to schedule delivery and counsel.  [place] Patient Rachel Frank Phone [phone] Fax [phone] 01/11/2021 9:53 AM

## 2021-01-11 NOTE — Patient Instructions (Signed)
Hypomagnesemia Hypomagnesemia is a condition in which the level of magnesium in the blood is low. Magnesium is a mineral that is found in many foods. It is used in many different processes in the body. Hypomagnesemia can affect every organ in thebody. In severe cases, it can cause life-threatening problems. What are the causes? This condition may be caused by: Not getting enough magnesium in your diet. Malnutrition. Problems with absorbing magnesium from the intestines. Dehydration. Alcohol abuse. Vomiting. Severe or chronic diarrhea. Some medicines, including medicines that make you urinate more (diuretics). Certain diseases, such as kidney disease, diabetes, celiac disease, and overactive thyroid. What are the signs or symptoms? Symptoms of this condition include: Loss of appetite. Nausea and vomiting. Involuntary shaking or trembling of a body part (tremor). Muscle weakness. Tingling in the arms and legs. Sudden tightening of muscles (muscle spasms). Confusion. Psychiatric issues, such as depression, irritability, or psychosis. A feeling of fluttering of the heart. Seizures. These symptoms are more severe if magnesium levels drop suddenly. How is this diagnosed? This condition may be diagnosed based on: Your symptoms and medical history. A physical exam. Blood and urine tests. How is this treated? Treatment depends on the cause and the severity of the condition. It may be treated with: A magnesium supplement. This can be taken in pill form. If the condition is severe, magnesium is usually given through an IV. Changes to your diet. You may be directed to eat foods that have a lot of magnesium, such as green leafy vegetables, peas, beans, and nuts. Stopping any intake of alcohol. Follow these instructions at home:     Make sure that your diet includes foods with magnesium. Foods that have a lot of magnesium in them include: Green leafy vegetables, such as spinach and  broccoli. Beans and peas. Nuts and seeds, such as almonds and sunflower seeds. Whole grains, such as whole grain bread and fortified cereals. Take magnesium supplements if your health care provider tells you to do that. Take them as directed. Take over-the-counter and prescription medicines only as told by your health care provider. Have your magnesium levels monitored as told by your health care provider. When you are active, drink fluids that contain electrolytes. Avoid drinking alcohol. Keep all follow-up visits as told by your health care provider. This is important. Contact a health care provider if: You get worse instead of better. Your symptoms return. Get help right away if you: Develop severe muscle weakness. Have trouble breathing. Feel that your heart is racing. Summary Hypomagnesemia is a condition in which the level of magnesium in the blood is low. Hypomagnesemia can affect every organ in the body. Treatment may include eating more foods that contain magnesium, taking magnesium supplements, and not drinking alcohol. Have your magnesium levels monitored as told by your health care provider. This information is not intended to replace advice given to you by your health care provider. Make sure you discuss any questions you have with your healthcare provider. Document Revised: 12/02/2019 Document Reviewed: 12/02/2019 Elsevier Patient Education  2022 Elsevier Inc.  

## 2021-01-12 ENCOUNTER — Telehealth: Payer: Self-pay

## 2021-01-12 NOTE — Telephone Encounter (Signed)
TC from Pt stating she received Lonsurf medication today and will start it on Monday 01/15/21. Went over instructions with daughter and Mother. Pt and daughter verbalized understanding.

## 2021-01-18 ENCOUNTER — Encounter: Payer: Self-pay | Admitting: Nurse Practitioner

## 2021-01-18 ENCOUNTER — Ambulatory Visit (HOSPITAL_COMMUNITY)
Admission: RE | Admit: 2021-01-18 | Discharge: 2021-01-18 | Disposition: A | Discharge status: Home / Self Care | Payer: 59 | Source: Ambulatory Visit | Attending: Nurse Practitioner | Admitting: Nurse Practitioner

## 2021-01-18 ENCOUNTER — Other Ambulatory Visit: Payer: Self-pay

## 2021-01-18 ENCOUNTER — Inpatient Hospital Stay: Payer: 59 | Attending: Physician Assistant | Admitting: Nurse Practitioner

## 2021-01-18 VITALS — BP 185/72 | HR 73 | Temp 97.6°F | Resp 18 | Wt 158.0 lb

## 2021-01-18 DIAGNOSIS — G893 Neoplasm related pain (acute) (chronic): Secondary | ICD-10-CM | POA: Insufficient documentation

## 2021-01-18 DIAGNOSIS — D630 Anemia in neoplastic disease: Secondary | ICD-10-CM | POA: Insufficient documentation

## 2021-01-18 DIAGNOSIS — E876 Hypokalemia: Secondary | ICD-10-CM | POA: Insufficient documentation

## 2021-01-18 DIAGNOSIS — C19 Malignant neoplasm of rectosigmoid junction: Secondary | ICD-10-CM | POA: Diagnosis not present

## 2021-01-18 DIAGNOSIS — T451X5D Adverse effect of antineoplastic and immunosuppressive drugs, subsequent encounter: Secondary | ICD-10-CM | POA: Insufficient documentation

## 2021-01-18 DIAGNOSIS — R110 Nausea: Secondary | ICD-10-CM | POA: Insufficient documentation

## 2021-01-18 DIAGNOSIS — Z5112 Encounter for antineoplastic immunotherapy: Secondary | ICD-10-CM | POA: Insufficient documentation

## 2021-01-18 DIAGNOSIS — R180 Malignant ascites: Secondary | ICD-10-CM | POA: Insufficient documentation

## 2021-01-18 DIAGNOSIS — G620 Drug-induced polyneuropathy: Secondary | ICD-10-CM | POA: Insufficient documentation

## 2021-01-18 DIAGNOSIS — Z5111 Encounter for antineoplastic chemotherapy: Secondary | ICD-10-CM | POA: Insufficient documentation

## 2021-01-18 DIAGNOSIS — R197 Diarrhea, unspecified: Secondary | ICD-10-CM | POA: Insufficient documentation

## 2021-01-18 DIAGNOSIS — C180 Malignant neoplasm of cecum: Secondary | ICD-10-CM | POA: Insufficient documentation

## 2021-01-18 HISTORY — PX: IR PARACENTESIS: IMG2679

## 2021-01-18 MED ORDER — LIDOCAINE HCL 1 % IJ SOLN
INTRAMUSCULAR | Status: AC
  Filled 2021-01-18: qty 20

## 2021-01-18 MED ORDER — LIDOCAINE HCL (PF) 1 % IJ SOLN
INTRAMUSCULAR | Status: DC | PRN
  Administered 2021-01-18: 10 mL

## 2021-01-18 NOTE — Procedures (Signed)
PROCEDURE SUMMARY:  Successful US guided paracentesis from right abdomen.  Yielded 2.6 L of clear yellow fluid.  No immediate complications.  Pt tolerated well.   Specimen sent for labs.  EBL < 2 mL  Theresa Duty, NP 01/18/2021 2:26 PM

## 2021-01-18 NOTE — Progress Notes (Signed)
[place] OFFICE PROGRESS NOTE   Diagnosis: Colon cancer  INTERVAL HISTORY:   Rachel Frank returns prior to scheduled follow-up for evaluation of dyspnea.  She reports progressive dyspnea on exertion over the past several weeks.  She has a slight cough which is not new.  No fever.  No chest pain.  She notes increased abdominal distention.  She becomes full quickly.  She continues to note bilateral leg swelling.  No significant improvement with elevation.  No nausea or vomiting.  No significant diarrhea.  Objective:  Vital signs in last 24 hours:  Blood pressure (!) 185/72, pulse 73, temperature 97.6 F (36.4 C), temperature source Temporal, resp. rate 18, weight 158 lb (71.7 kg), SpO2 100 %.   Resp: Lungs clear bilaterally. Cardio: Regular rate and rhythm. GI: Abdomen is markedly distended.  Liver is palpable in the right upper abdomen. Vascular: Pitting edema lower leg bilaterally. Port-A-Cath without erythema.  Lab Results:  Lab Results  Component Value Date   WBC 9.1 01/09/2021   HGB 11.9 (L) 01/09/2021   HCT 37.0 01/09/2021   MCV 83.9 01/09/2021   PLT 207 01/09/2021   NEUTROABS 6.4 01/09/2021    Imaging:  No results found.  Medications: I have reviewed the patient's current medications.  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 She presented with a large cecal mass, extensive hepatic metastatic disease; mild thoracic and abdominal adenopathy. She also has several small nonspecific pulmonary nodules. She was diagnosed in March 2021. Colonoscopy 09/16/2019-fungating mass at the cecum, sessile polyps in the this sigmoid, transverse colon, and hepatic flexure, adenocarcinoma involving cecum biopsy, tubular adenoma and sessile serrated polyps CTs chest, abdomen, and pelvis 10/08/2019-extensive liver metastases, thoracic/abdominal adenopathy, cecal mass, dilated and thin thickened appendix, nonspecific pulmonary nodules Ultrasound-guided liver  biopsy 10/13/2019-adenocarcinoma consistent with a metastatic colorectal primary, MSS, tumor mutation burden 3, KRAS Q61, Cycle 1 FOLFOX 10/14/2019 Cycle 2 FOLFOX 10/27/2019 Cycle 3 FOLFOX 11/11/2019, Emend and Decadron added, 5-FU dose escalated Cycle 4 FOLFOX 11/25/2019, Udenyca added Cycle 5 FOLFOX 12/08/2019 Cycle 6 FOLFOX 12/23/2019 CT 01/04/2020-decreased size of multiple hepatic metastases, slight decrease in bulk of cecal mass, stable ileocecal adenopathy Cycle 7 FOLFOX 01/06/2020 Cycle 8 FOLFOX 01/31/2020 Cycle 9 FOLFOX 02/17/2020 (oxaliplatin dose reduced due to borderline ANC, persistent cold sensitivity) Cycle 10 FOLFOX 03/02/2020 Cycle 11 FOLFOX 03/16/2020 Cycle 12 FOLFOX 03/30/2020 (oxaliplatin held secondary to neuropathy) CT abdomen/pelvis 04/19/2020-no significant change in the abnormal soft tissue lesion in the region of the cecal tip involving the ileocecal valve and appendiceal orifice; slight interval progression of numerous hepatic metastases; similar appearance of small lymph nodes in the ileocolic mesentery with adjacent irregular focus of soft tissue also stable Cycle 1 FOLFIRI/Avastin 04/25/2020 Cycle 2 FOLFIRI/Avastin 05/11/2020 Cycle 3 FOLFIRI 05/25/2020 (Avastin held due to proteinuria) Cycle 4 FOLFIRI/Avastin 06/07/2020 Cycle 5 FOLFIRI/Avastin 06/22/2020 CTs 07/05/2020-mild improvement in multifocal liver metastases.  Mass at the base of the cecum is stable.  Persistent thickening of the appendix. Cycle 6 FOLFIRI/Avastin 07/05/2020 Cycle 7 FOLFIRI/Avastin 07/20/2020 Cycle 8 FOLFIRI/Avastin 08/03/2020 Cycle 9 FOLFIRI 09/05/2020, Avastin held secondary to proteinuria CT chest 09/05/2020-negative for pulmonary embolism, no acute disease, unchanged small pulmonary nodules Cycle 10 FOLFIRI 09/20/2020, Avastin held secondary to proteinuria Cycle 11 FOLFIRI 10/04/2020, Avastin held secondary to proteinuria CTs 10/17/2020-increase in size of liver metastases, stable cecal mass, slight decrease  in size of right lower quadrant mesenteric lymph node Cycle 12 FOLFIRI 10/18/2020, Avastin held secondary to proteinuria Cycle 1 FOLFOX/Avastin 11/13/2020 Cycle 2 FOLFOX/Avastin  11/27/2020 Cycle 3 FOLFOX/Avastin 12/12/2020 Cycle 4 FOLFOX/Avastin 12/26/2020 CT 01/04/2021-bulky confluent liver metastases, some are larger and others are stable, increased ascites with suggestion of peritoneal thickening and nodularity, unchanged cecal mass 01/09/2021-every 2-week Avastin continued Cycle 1 Lonsurf 01/15/2021 2) Iron deficiency anemia secondary to #1.  3.  Pain secondary to #1-improved 4.  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  Diarrhea 01/20/2020.  C. difficile negative 7.  COVID-19 infection 08/13/2020, antibody infusion therapy on 08/19/2020 8.  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  Inflamed left labial cyst 09/29/2020-doxycycline      Disposition: Rachel Frank has metastatic colon cancer.  She began cycle 1 Lonsurf 01/15/2021.  She continues every 2-week Avastin.  She presents today for evaluation of progressive dyspnea on exertion.  She has marked abdominal distention, likely due to worsening ascites.  We are referring her for a diagnostic/therapeutic paracentesis, send fluid for cytology.  She understands to contact the office if there is no improvement in the dyspnea following removal of the fluid.  She is scheduled to return for follow-up on 01/23/2021.  We are available to see her sooner if needed.  Patient seen with Dr. Benay Spice.  Ned Card ANP/GNP-BC   01/18/2021  9:28 AM This was a shared visit with Ned Card.  [name] was interviewed and examined.  She presents with dyspnea.  The dyspnea is likely related to abdominal distention by ascites.  She will  be referred for a diagnostic/therapeutic paracentesis.  She will contact us if the dyspnea does not improve following the paracentesis.  I was present for greater than 50% of today's visit.  I performed medical decision making.  Julieanne Manson, MD

## 2021-01-19 LAB — CYTOLOGY - NON PAP

## 2021-01-21 ENCOUNTER — Other Ambulatory Visit: Payer: Self-pay | Admitting: Oncology

## 2021-01-23 ENCOUNTER — Other Ambulatory Visit: Payer: Self-pay | Admitting: *Deleted

## 2021-01-23 ENCOUNTER — Other Ambulatory Visit: Payer: Self-pay

## 2021-01-23 ENCOUNTER — Inpatient Hospital Stay: Payer: 59

## 2021-01-23 ENCOUNTER — Inpatient Hospital Stay (HOSPITAL_BASED_OUTPATIENT_CLINIC_OR_DEPARTMENT_OTHER): Payer: 59 | Admitting: Oncology

## 2021-01-23 VITALS — BP 153/96 | HR 101 | Temp 97.8°F | Resp 18 | Ht 62.0 in | Wt 168.2 lb

## 2021-01-23 VITALS — HR 99

## 2021-01-23 DIAGNOSIS — R110 Nausea: Secondary | ICD-10-CM | POA: Diagnosis not present

## 2021-01-23 DIAGNOSIS — R197 Diarrhea, unspecified: Secondary | ICD-10-CM | POA: Diagnosis not present

## 2021-01-23 DIAGNOSIS — G620 Drug-induced polyneuropathy: Secondary | ICD-10-CM | POA: Diagnosis not present

## 2021-01-23 DIAGNOSIS — C19 Malignant neoplasm of rectosigmoid junction: Secondary | ICD-10-CM

## 2021-01-23 DIAGNOSIS — Z5111 Encounter for antineoplastic chemotherapy: Secondary | ICD-10-CM | POA: Diagnosis present

## 2021-01-23 DIAGNOSIS — C180 Malignant neoplasm of cecum: Secondary | ICD-10-CM | POA: Diagnosis present

## 2021-01-23 DIAGNOSIS — E876 Hypokalemia: Secondary | ICD-10-CM | POA: Diagnosis not present

## 2021-01-23 DIAGNOSIS — D630 Anemia in neoplastic disease: Secondary | ICD-10-CM | POA: Diagnosis not present

## 2021-01-23 DIAGNOSIS — Z5112 Encounter for antineoplastic immunotherapy: Secondary | ICD-10-CM | POA: Diagnosis present

## 2021-01-23 DIAGNOSIS — T451X5D Adverse effect of antineoplastic and immunosuppressive drugs, subsequent encounter: Secondary | ICD-10-CM | POA: Diagnosis not present

## 2021-01-23 DIAGNOSIS — G893 Neoplasm related pain (acute) (chronic): Secondary | ICD-10-CM | POA: Diagnosis not present

## 2021-01-23 LAB — CMP (CANCER CENTER ONLY)
ALT: 28 U/L (ref 0–44)
AST: 63 U/L — ABNORMAL HIGH (ref 15–41)
Albumin: 2.5 g/dL — ABNORMAL LOW (ref 3.5–5.0)
Alkaline Phosphatase: 1026 U/L — ABNORMAL HIGH (ref 38–126)
Anion gap: 9 (ref 5–15)
BUN: 9 mg/dL (ref 8–23)
CO2: 23 mmol/L (ref 22–32)
Calcium: 8.8 mg/dL — ABNORMAL LOW (ref 8.9–10.3)
Chloride: 97 mmol/L — ABNORMAL LOW (ref 98–111)
Creatinine: 0.34 mg/dL — ABNORMAL LOW (ref 0.44–1.00)
GFR, Estimated: >60 mL/min (ref >60)
Glucose, Bld: 106 mg/dL — ABNORMAL HIGH (ref 70–99)
Potassium: 4.7 mmol/L (ref 3.5–5.1)
Sodium: 129 mmol/L — ABNORMAL LOW (ref 135–145)
Total Bilirubin: 1.7 mg/dL — ABNORMAL HIGH (ref 0.3–1.2)
Total Protein: 6.5 g/dL (ref 6.5–8.1)

## 2021-01-23 LAB — CBC WITH DIFFERENTIAL (CANCER CENTER ONLY)
Abs Immature Granulocytes: 0.04 10*3/uL (ref 0.00–0.07)
Basophils Absolute: 0.1 10*3/uL (ref 0.0–0.1)
Basophils Relative: 1 %
Eosinophils Absolute: 0.1 10*3/uL (ref 0.0–0.5)
Eosinophils Relative: 1 %
HCT: 35.9 % — ABNORMAL LOW (ref 36.0–46.0)
Hemoglobin: 11.5 g/dL — ABNORMAL LOW (ref 12.0–15.0)
Immature Granulocytes: 0 %
Lymphocytes Relative: 22 %
Lymphs Abs: 2.5 10*3/uL (ref 0.7–4.0)
MCH: 26.9 pg (ref 26.0–34.0)
MCHC: 32 g/dL (ref 30.0–36.0)
MCV: 84.1 fL (ref 80.0–100.0)
Monocytes Absolute: 1 10*3/uL (ref 0.1–1.0)
Monocytes Relative: 9 %
Neutro Abs: 7.7 10*3/uL (ref 1.7–7.7)
Neutrophils Relative %: 67 %
Platelet Count: 362 10*3/uL (ref 150–400)
RBC: 4.27 MIL/uL (ref 3.87–5.11)
RDW: 18 % — ABNORMAL HIGH (ref 11.5–15.5)
WBC Count: 11.4 10*3/uL — ABNORMAL HIGH (ref 4.0–10.5)
nRBC: 0.3 % — ABNORMAL HIGH (ref 0.0–0.2)

## 2021-01-23 LAB — CEA (ACCESS): CEA (CHCC): 40227.98 ng/mL — ABNORMAL HIGH (ref 0.00–5.00)

## 2021-01-23 LAB — SAMPLE TO BLOOD BANK

## 2021-01-23 MED ORDER — HEPARIN SOD (PORK) LOCK FLUSH 100 UNIT/ML IV SOLN
500.0000 [IU] | Freq: Once | INTRAVENOUS | Status: AC | PRN
  Administered 2021-01-23: 500 [IU]
  Filled 2021-01-23: qty 5

## 2021-01-23 MED ORDER — SODIUM CHLORIDE 0.9 % IV SOLN
Freq: Once | INTRAVENOUS | Status: AC
  Filled 2021-01-23: qty 250

## 2021-01-23 MED ORDER — SODIUM CHLORIDE 0.9% FLUSH
10.0000 mL | INTRAVENOUS | Status: DC | PRN
  Administered 2021-01-23: 10 mL
  Filled 2021-01-23: qty 10

## 2021-01-23 MED ORDER — BEVACIZUMAB-BVZR CHEMO INJECTION 400 MG/16ML
5.0000 mg/kg | Freq: Once | INTRAVENOUS | Status: AC
  Administered 2021-01-23: 350 mg via INTRAVENOUS
  Filled 2021-01-23: qty 14

## 2021-01-23 NOTE — Progress Notes (Signed)
[place] scheduled for [facility] at 1345/1400 on 01/24/21 (nothing at [facility] till next week). Husband and patient notified

## 2021-01-23 NOTE — Progress Notes (Signed)
[place] OFFICE PROGRESS NOTE   Diagnosis: Colon cancer  INTERVAL HISTORY:   Rachel Frank returns as scheduled.  She is completing week #2 Lonsurf.  She does not have significant diarrhea.  She has increased pain in the upper abdomen.  She takes oxycodone infrequently.  She was last treated with Avastin on 01/09/2021.  She underwent a paracentesis on 01/18/2021.  2.6 L of yellow fluid was removed.  The cytology revealed no malignant cells.  Dyspnea improved following the paracentesis.  She feels the fluid is reaccumulating.  Objective:  Vital signs in last 24 hours:  Blood pressure (!) 153/96, pulse (!) 101, temperature 97.8 F (36.6 C), temperature source Oral, resp. rate 18, height $RemoveBe'5\' 2"'LDegVfAKD$  (1.575 m), weight 168 lb 3.2 oz (76.3 kg), SpO2 99 %.    HEENT: No thrush or ulcers Resp: Lungs clear bilaterally Cardio: Regular rate and rhythm GI: Distended with fullness in the right upper abdomen.  Tenderness in the right subcostal region Vascular: No leg edema    Portacath/PICC-without erythema  Lab Results:  Lab Results  Component Value Date   WBC 11.4 (H) 01/23/2021   HGB 11.5 (L) 01/23/2021   HCT 35.9 (L) 01/23/2021   MCV 84.1 01/23/2021   PLT 362 01/23/2021   NEUTROABS 7.7 01/23/2021    CMP  Lab Results  Component Value Date   NA 137 01/09/2021   K 3.4 (L) 01/09/2021   CL 101 01/09/2021   CO2 25 01/09/2021   GLUCOSE 108 (H) 01/09/2021   BUN 8 01/09/2021   CREATININE 0.31 (L) 01/09/2021   CALCIUM 8.4 (L) 01/09/2021   PROT 6.2 (L) 01/09/2021   ALBUMIN 2.7 (L) 01/09/2021   AST 73 (H) 01/09/2021   ALT 34 01/09/2021   ALKPHOS 1,093 (H) 01/09/2021   BILITOT 0.8 01/09/2021   GFRNONAA >60 01/09/2021   GFRAA >60 03/30/2020    Lab Results  Component Value Date   CEA1 32,355.73 (H) 12/12/2020   CEA 22,025.42 (H) 01/09/2021     Medications: I have reviewed the patient's current medications.   Assessment/Plan: Stage IV adenocarcinoma of the cecum.  She presented with a large cecal mass, extensive hepatic metastatic disease; mild thoracic and abdominal adenopathy. She also has several small nonspecific pulmonary nodules. She was diagnosed in March 2021. Colonoscopy 09/16/2019-fungating mass at the cecum, sessile polyps in the this sigmoid, transverse colon, and hepatic flexure, adenocarcinoma involving cecum biopsy, tubular adenoma and sessile serrated polyps CTs chest, abdomen, and pelvis 10/08/2019-extensive liver metastases, thoracic/abdominal adenopathy, cecal mass, dilated and thin thickened appendix, nonspecific pulmonary nodules Ultrasound-guided liver biopsy 10/13/2019-adenocarcinoma consistent with a metastatic colorectal primary, MSS, tumor mutation burden 3, KRAS Q61, Cycle 1 FOLFOX 10/14/2019 Cycle 2 FOLFOX 10/27/2019 Cycle 3 FOLFOX 11/11/2019, Emend and Decadron added, 5-FU dose escalated Cycle 4 FOLFOX 11/25/2019, Udenyca added Cycle 5 FOLFOX 12/08/2019 Cycle 6 FOLFOX 12/23/2019 CT 01/04/2020-decreased size of multiple hepatic metastases, slight decrease in bulk of cecal mass, stable ileocecal adenopathy Cycle 7 FOLFOX 01/06/2020 Cycle 8 FOLFOX 01/31/2020 Cycle 9 FOLFOX 02/17/2020 (oxaliplatin dose reduced due to borderline ANC, persistent cold sensitivity) Cycle 10 FOLFOX 03/02/2020 Cycle 11 FOLFOX 03/16/2020 Cycle 12 FOLFOX 03/30/2020 (oxaliplatin held secondary to neuropathy) CT abdomen/pelvis 04/19/2020-no significant change in the abnormal soft tissue lesion in the region of the cecal tip involving the ileocecal valve and appendiceal orifice; slight interval progression of numerous hepatic metastases; similar appearance of small lymph nodes in the ileocolic mesentery with adjacent irregular focus of soft tissue also stable Cycle 1  FOLFIRI/Avastin 04/25/2020 Cycle 2 FOLFIRI/Avastin 05/11/2020 Cycle 3 FOLFIRI 05/25/2020 (Avastin held due to proteinuria) Cycle 4 FOLFIRI/Avastin 06/07/2020 Cycle 5 FOLFIRI/Avastin 06/22/2020 CTs 07/05/2020-mild  improvement in multifocal liver metastases.  Mass at the base of the cecum is stable.  Persistent thickening of the appendix. Cycle 6 FOLFIRI/Avastin 07/05/2020 Cycle 7 FOLFIRI/Avastin 07/20/2020 Cycle 8 FOLFIRI/Avastin 08/03/2020 Cycle 9 FOLFIRI 09/05/2020, Avastin held secondary to proteinuria CT chest 09/05/2020-negative for pulmonary embolism, no acute disease, unchanged small pulmonary nodules Cycle 10 FOLFIRI 09/20/2020, Avastin held secondary to proteinuria Cycle 11 FOLFIRI 10/04/2020, Avastin held secondary to proteinuria CTs 10/17/2020-increase in size of liver metastases, stable cecal mass, slight decrease in size of right lower quadrant mesenteric lymph node Cycle 12 FOLFIRI 10/18/2020, Avastin held secondary to proteinuria Cycle 1 FOLFOX/Avastin 11/13/2020 Cycle 2 FOLFOX/Avastin 11/27/2020 Cycle 3 FOLFOX/Avastin 12/12/2020 Cycle 4 FOLFOX/Avastin 12/26/2020 CT 01/04/2021-bulky confluent liver metastases, some are larger and others are stable, increased ascites with suggestion of peritoneal thickening and nodularity, unchanged cecal mass 01/09/2021-every 2-week Avastin continued Cycle 1 Lonsurf 01/15/2021 Therapeutic paracentesis 01/18/2021-cytology negative 2) Iron deficiency anemia secondary to #1.  3.  Pain secondary to #1-improved 4.  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  Diarrhea 01/20/2020.  C. difficile negative 7.  COVID-19 infection 08/13/2020, antibody infusion therapy on 08/19/2020 8.  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  Inflamed left labial cyst 09/29/2020-doxycycline       Disposition: Rachel Frank has metastatic colon cancer.  She is completing a first cycle of Lonsurf.  She appears to be tolerating Lonsurf well.  She continues every 2-week  Avastin.  The abdominal pain is likely secondary to metastatic disease involving the liver.  I encouraged her to use oxycodone every 4 hours as needed.  She will contact us if oxycodone does not relieve the pain.  We will refer her for another therapeutic paracentesis.  Rachel Frank will return for an office visit and Avastin in 2 weeks.  Betsy Coder, MD  01/23/2021  9:09 AM

## 2021-01-23 NOTE — Patient Instructions (Signed)
[facility]   Discharge Instructions: Thank you for choosing [facility] to provide your oncology and hematology care.   If you have a lab appointment with the [facility], please go directly to the [facility] and check in at the registration area.   Wear comfortable clothing and clothing appropriate for easy access to any Portacath or PICC line.   We strive to give you quality time with your provider. You may need to reschedule your appointment if you arrive late (15 or more minutes).  Arriving late affects you and other patients whose appointments are after yours.  Also, if you miss three or more appointments without notifying the office, you may be dismissed from the clinic at the provider's discretion.      For prescription refill requests, have your pharmacy contact our office and allow 72 hours for refills to be completed.    Today you received the following chemotherapy and/or immunotherapy agents Bevacizumab-bvzr (ZIRABEV).      To help prevent nausea and vomiting after your treatment, we encourage you to take your nausea medication as directed.  BELOW ARE SYMPTOMS THAT SHOULD BE REPORTED IMMEDIATELY: *FEVER GREATER THAN 100.4 F (38 C) OR HIGHER *CHILLS OR SWEATING *NAUSEA AND VOMITING THAT IS NOT CONTROLLED WITH YOUR NAUSEA MEDICATION *UNUSUAL SHORTNESS OF BREATH *UNUSUAL BRUISING OR BLEEDING *URINARY PROBLEMS (pain or burning when urinating, or frequent urination) *BOWEL PROBLEMS (unusual diarrhea, constipation, pain near the anus) TENDERNESS IN MOUTH AND THROAT WITH OR WITHOUT PRESENCE OF ULCERS (sore throat, sores in mouth, or a toothache) UNUSUAL RASH, SWELLING OR PAIN  UNUSUAL VAGINAL DISCHARGE OR ITCHING   Items with * indicate a potential emergency and should be followed up as soon as possible or go to the Emergency Department if any problems should occur.  Please show the CHEMOTHERAPY ALERT CARD or IMMUNOTHERAPY ALERT CARD  at check-in to the Emergency Department and triage nurse.  Should you have questions after your visit or need to cancel or reschedule your appointment, please contact [facility]  Dept: [phone]  and follow the prompts.  Office hours are 8:00 a.m. to 4:30 p.m. Monday - Friday. Please note that voicemails left after 4:00 p.m. may not be returned until the following business day.  We are closed weekends and major holidays. You have access to a nurse at all times for urgent questions. Please call the main number to the clinic Dept: [phone] and follow the prompts.   For any non-urgent questions, you may also contact your provider using MyChart. We now offer e-Visits for anyone 18 and older to request care online for non-urgent symptoms. For details visit [email or web address].   Also download the MyChart app! Go to the app store, search "MyChart", open the app, select [facility], and log in with your MyChart username and password.  Due to Covid, a mask is required upon entering the hospital/clinic. If you do not have a mask, one will be given to you upon arrival. For doctor visits, patients may have 1 support person aged 18 or older with them. For treatment visits, patients cannot have anyone with them due to current Covid guidelines and our immunocompromised population.   Bevacizumab injection What is this medication? BEVACIZUMAB (be va SIZ yoo mab) is a monoclonal antibody. It is used to treat many types of cancer. This medicine may be used for other purposes; ask your health care provider or pharmacist if you have questions. COMMON BRAND NAME(S):   Avastin, MVASI, Zirabev What should I tell my care team before I take this medication? They need to know if you have any of these conditions: diabetes heart disease high blood pressure history of coughing up blood prior anthracycline chemotherapy (e.g., doxorubicin, daunorubicin, epirubicin) recent or ongoing  radiation therapy recent or planning to have surgery stroke an unusual or allergic reaction to bevacizumab, hamster proteins, mouse proteins, other medicines, foods, dyes, or preservatives pregnant or trying to get pregnant breast-feeding How should I use this medication? This medicine is for infusion into a vein. It is given by a health care professional in a hospital or clinic setting. Talk to your pediatrician regarding the use of this medicine in children. Special care may be needed. Overdosage: If you think you have taken too much of this medicine contact a poison control [facility] or emergency room at once. NOTE: This medicine is only for you. Do not share this medicine with others. What if I miss a dose? It is important not to miss your dose. Call your doctor or health care professional if you are unable to keep an appointment. What may interact with this medication? Interactions are not expected. This list may not describe all possible interactions. Give your health care provider a list of all the medicines, herbs, non-prescription drugs, or dietary supplements you use. Also tell them if you smoke, drink alcohol, or use illegal drugs. Some items may interact with your medicine. What should I watch for while using this medication? Your condition will be monitored carefully while you are receiving this medicine. You will need important blood work and urine testing done while you are taking this medicine. This medicine may increase your risk to bruise or bleed. Call your doctor or health care professional if you notice any unusual bleeding. Before having surgery, talk to your health care provider to make sure it is ok. This drug can increase the risk of poor healing of your surgical site or wound. You will need to stop this drug for 28 days before surgery. After surgery, wait at least 28 days before restarting this drug. Make sure the surgical site or wound is healed enough before restarting  this drug. Talk to your health care provider if questions. Do not become pregnant while taking this medicine or for 6 months after stopping it. Women should inform their doctor if they wish to become pregnant or think they might be pregnant. There is a potential for serious side effects to an unborn child. Talk to your health care professional or pharmacist for more information. Do not breast-feed an infant while taking this medicine and for 6 months after the last dose. This medicine has caused ovarian failure in some women. This medicine may interfere with the ability to have a child. You should talk to your doctor or health care professional if you are concerned about your fertility. What side effects may I notice from receiving this medication? Side effects that you should report to your doctor or health care professional as soon as possible: allergic reactions like skin rash, itching or hives, swelling of the face, lips, or tongue chest pain or chest tightness chills coughing up blood high fever seizures severe constipation signs and symptoms of bleeding such as bloody or black, tarry stools; red or dark-brown urine; spitting up blood or brown material that looks like coffee grounds; red spots on the skin; unusual bruising or bleeding from the eye, gums, or nose signs and symptoms of a blood clot such as breathing   problems; chest pain; severe, sudden headache; pain, swelling, warmth in the leg signs and symptoms of a stroke like changes in vision; confusion; trouble speaking or understanding; severe headaches; sudden numbness or weakness of the face, arm or leg; trouble walking; dizziness; loss of balance or coordination stomach pain sweating swelling of legs or ankles vomiting weight gain Side effects that usually do not require medical attention (report to your doctor or health care professional if they continue or are bothersome): back pain changes in taste decreased appetite dry  skin nausea tiredness This list may not describe all possible side effects. Call your doctor for medical advice about side effects. You may report side effects to FDA at 1-800-FDA-1088. Where should I keep my medication? This drug is given in a hospital or clinic and will not be stored at home. NOTE: This sheet is a summary. It may not cover all possible information. If you have questions about this medicine, talk to your doctor, pharmacist, or health care provider.  2022 Elsevier/Gold Standard (2019-04-28 10:50:46)  

## 2021-01-23 NOTE — Patient Instructions (Signed)
Implanted Port Home Guide An implanted port is a device that is placed under the skin. It is usually placed in the chest. The device can be used to give IV medicine, to take blood, or for dialysis. You may have an implanted port if: You need IV medicine that would be irritating to the small veins in your hands or arms. You need IV medicines, such as antibiotics, for a long period of time. You need IV nutrition for a long period of time. You need dialysis. When you have a port, your health care provider can choose to use the port instead of veins in your arms for these procedures. You may have fewer limitations when using a port than you would if you used other types of long-term IVs, and you will likely be able to return to normal activities afteryour incision heals. An implanted port has two main parts: Reservoir. The reservoir is the part where a needle is inserted to give medicines or draw blood. The reservoir is round. After it is placed, it appears as a small, raised area under your skin. Catheter. The catheter is a thin, flexible tube that connects the reservoir to a vein. Medicine that is inserted into the reservoir goes into the catheter and then into the vein. How is my port accessed? To access your port: A numbing cream may be placed on the skin over the port site. Your health care provider will put on a mask and sterile gloves. The skin over your port will be cleaned carefully with a germ-killing soap and allowed to dry. Your health care provider will gently pinch the port and insert a needle into it. Your health care provider will check for a blood return to make sure the port is in the vein and is not clogged. If your port needs to remain accessed to get medicine continuously (constant infusion), your health care provider will place a clear bandage (dressing) over the needle site. The dressing and needle will need to be changed every week, or as told by your health care provider. What  is flushing? Flushing helps keep the port from getting clogged. Follow instructions from your health care provider about how and when to flush the port. Ports are usually flushed with saline solution or a medicine called heparin. The need for flushing will depend on how the port is used: If the port is only used from time to time to give medicines or draw blood, the port may need to be flushed: Before and after medicines have been given. Before and after blood has been drawn. As part of routine maintenance. Flushing may be recommended every 4-6 weeks. If a constant infusion is running, the port may not need to be flushed. Throw away any syringes in a disposal container that is meant for sharp items (sharps container). You can buy a sharps container from a pharmacy, or you can make one by using an empty hard plastic bottle with a cover. How long will my port stay implanted? The port can stay in for as long as your health care provider thinks it is needed. When it is time for the port to come out, a surgery will be done to remove it. The surgery will be similar to the procedure that was done to putthe port in. Follow these instructions at home:  Flush your port as told by your health care provider. If you need an infusion over several days, follow instructions from your health care provider about how to take   care of your port site. Make sure you: Wash your hands with soap and water before you change your dressing. If soap and water are not available, use alcohol-based hand sanitizer. Change your dressing as told by your health care provider. Place any used dressings or infusion bags into a plastic bag. Throw that bag in the trash. Keep the dressing that covers the needle clean and dry. Do not get it wet. Do not use scissors or sharp objects near the tube. Keep the tube clamped, unless it is being used. Check your port site every day for signs of infection. Check for: Redness, swelling, or  pain. Fluid or blood. Pus or a bad smell. Protect the skin around the port site. Avoid wearing bra straps that rub or irritate the site. Protect the skin around your port from seat belts. Place a soft pad over your chest if needed. Bathe or shower as told by your health care provider. The site may get wet as long as you are not actively receiving an infusion. Return to your normal activities as told by your health care provider. Ask your health care provider what activities are safe for you. Carry a medical alert card or wear a medical alert bracelet at all times. This will let health care providers know that you have an implanted port in case of an emergency. Get help right away if: You have redness, swelling, or pain at the port site. You have fluid or blood coming from your port site. You have pus or a bad smell coming from the port site. You have a fever. Summary Implanted ports are usually placed in the chest for long-term IV access. Follow instructions from your health care provider about flushing the port and changing bandages (dressings). Take care of the area around your port by avoiding clothing that puts pressure on the area, and by watching for signs of infection. Protect the skin around your port from seat belts. Place a soft pad over your chest if needed. Get help right away if you have a fever or you have redness, swelling, pain, drainage, or a bad smell at the port site. This information is not intended to replace advice given to you by your health care provider. Make sure you discuss any questions you have with your healthcare provider. Document Revised: 11/15/2019 Document Reviewed: 11/15/2019 Elsevier Patient Education  2022 Elsevier Inc.  

## 2021-01-23 NOTE — Progress Notes (Signed)
Per Dr. Benay Spice, ok to treat with last urine protein on 12/25/20.

## 2021-01-24 ENCOUNTER — Ambulatory Visit (HOSPITAL_COMMUNITY)
Admission: RE | Admit: 2021-01-24 | Discharge: 2021-01-24 | Disposition: A | Discharge status: Home / Self Care | Payer: 59 | Source: Ambulatory Visit | Attending: Oncology | Admitting: Oncology

## 2021-01-24 ENCOUNTER — Other Ambulatory Visit: Payer: Self-pay | Admitting: *Deleted

## 2021-01-24 DIAGNOSIS — C19 Malignant neoplasm of rectosigmoid junction: Secondary | ICD-10-CM | POA: Insufficient documentation

## 2021-01-24 DIAGNOSIS — R188 Other ascites: Secondary | ICD-10-CM | POA: Diagnosis not present

## 2021-01-24 HISTORY — PX: IR PARACENTESIS: IMG2679

## 2021-01-24 MED ORDER — LIDOCAINE HCL (PF) 1 % IJ SOLN
INTRAMUSCULAR | Status: DC | PRN
  Administered 2021-01-24: 5 mL

## 2021-01-24 MED ORDER — LONSURF 20-8.19 MG PO TABS: 35.0000 mg/m2 | ORAL_TABLET | Freq: Two times a day (BID) | ORAL | 0 refills | Status: DC

## 2021-01-24 MED ORDER — LIDOCAINE HCL 1 % IJ SOLN
INTRAMUSCULAR | Status: AC
  Filled 2021-01-24: qty 20

## 2021-01-24 NOTE — Procedures (Signed)
Ultrasound-guided diagnostic and therapeutic paracentesis performed yielding 2.4 liters of straw colored fluid.  Fluid was sent to lab for analysis. No immediate complications. EBL is none.  

## 2021-01-25 ENCOUNTER — Inpatient Hospital Stay: Payer: 59

## 2021-01-25 LAB — CYTOLOGY - NON PAP

## 2021-01-29 ENCOUNTER — Telehealth: Payer: Self-pay | Admitting: *Deleted

## 2021-01-29 NOTE — Telephone Encounter (Signed)
Called to ask if she is to take her Lonsurf this week? Confirmed she took Lonsurf week of 7/4 and 7/11. Informed her this is her 2 week break. She verbalized understanding.

## 2021-02-03 ENCOUNTER — Other Ambulatory Visit: Payer: Self-pay | Admitting: Oncology

## 2021-02-06 ENCOUNTER — Other Ambulatory Visit: Payer: Self-pay

## 2021-02-06 ENCOUNTER — Inpatient Hospital Stay: Payer: 59

## 2021-02-06 ENCOUNTER — Encounter: Payer: Self-pay | Admitting: *Deleted

## 2021-02-06 ENCOUNTER — Inpatient Hospital Stay (HOSPITAL_BASED_OUTPATIENT_CLINIC_OR_DEPARTMENT_OTHER): Payer: 59 | Admitting: Oncology

## 2021-02-06 VITALS — BP 150/88 | HR 94 | Temp 98.3°F | Resp 16 | Wt 172.0 lb

## 2021-02-06 VITALS — BP 136/97 | HR 92

## 2021-02-06 DIAGNOSIS — C19 Malignant neoplasm of rectosigmoid junction: Secondary | ICD-10-CM

## 2021-02-06 DIAGNOSIS — G893 Neoplasm related pain (acute) (chronic): Secondary | ICD-10-CM

## 2021-02-06 DIAGNOSIS — Z5112 Encounter for antineoplastic immunotherapy: Secondary | ICD-10-CM | POA: Diagnosis not present

## 2021-02-06 LAB — CMP (CANCER CENTER ONLY)
ALT: 26 U/L (ref 0–44)
AST: 57 U/L — ABNORMAL HIGH (ref 15–41)
Albumin: 2.4 g/dL — ABNORMAL LOW (ref 3.5–5.0)
Alkaline Phosphatase: 1014 U/L — ABNORMAL HIGH (ref 38–126)
Anion gap: 9 (ref 5–15)
BUN: 11 mg/dL (ref 8–23)
CO2: 22 mmol/L (ref 22–32)
Calcium: 8.2 mg/dL — ABNORMAL LOW (ref 8.9–10.3)
Chloride: 99 mmol/L (ref 98–111)
Creatinine: 0.48 mg/dL (ref 0.44–1.00)
GFR, Estimated: >60 mL/min (ref >60)
Glucose, Bld: 123 mg/dL — ABNORMAL HIGH (ref 70–99)
Potassium: 4.9 mmol/L (ref 3.5–5.1)
Sodium: 130 mmol/L — ABNORMAL LOW (ref 135–145)
Total Bilirubin: 2.6 mg/dL — ABNORMAL HIGH (ref 0.3–1.2)
Total Protein: 6.4 g/dL — ABNORMAL LOW (ref 6.5–8.1)

## 2021-02-06 LAB — CBC WITH DIFFERENTIAL (CANCER CENTER ONLY)
Abs Immature Granulocytes: 0.01 10*3/uL (ref 0.00–0.07)
Basophils Absolute: 0 10*3/uL (ref 0.0–0.1)
Basophils Relative: 1 %
Eosinophils Absolute: 0 10*3/uL (ref 0.0–0.5)
Eosinophils Relative: 1 %
HCT: 36.2 % (ref 36.0–46.0)
Hemoglobin: 11.4 g/dL — ABNORMAL LOW (ref 12.0–15.0)
Immature Granulocytes: 0 %
Lymphocytes Relative: 18 %
Lymphs Abs: 1.3 10*3/uL (ref 0.7–4.0)
MCH: 28 pg (ref 26.0–34.0)
MCHC: 31.5 g/dL (ref 30.0–36.0)
MCV: 88.9 fL (ref 80.0–100.0)
Monocytes Absolute: 1 10*3/uL (ref 0.1–1.0)
Monocytes Relative: 15 %
Neutro Abs: 4.7 10*3/uL (ref 1.7–7.7)
Neutrophils Relative %: 65 %
Platelet Count: 354 10*3/uL (ref 150–400)
RBC: 4.07 MIL/uL (ref 3.87–5.11)
RDW: 20 % — ABNORMAL HIGH (ref 11.5–15.5)
WBC Count: 7.1 10*3/uL (ref 4.0–10.5)
nRBC: 0 % (ref 0.0–0.2)

## 2021-02-06 LAB — CEA (ACCESS): CEA (CHCC): 44854 ng/mL — ABNORMAL HIGH (ref 0.00–5.00)

## 2021-02-06 LAB — TOTAL PROTEIN, URINE DIPSTICK: Protein, ur: 100 mg/dL — AB

## 2021-02-06 MED ORDER — SODIUM CHLORIDE 0.9 % IV SOLN
400.0000 mg | Freq: Once | INTRAVENOUS | Status: AC
  Administered 2021-02-06: 400 mg via INTRAVENOUS
  Filled 2021-02-06: qty 16

## 2021-02-06 MED ORDER — SODIUM CHLORIDE 0.9% FLUSH
10.0000 mL | INTRAVENOUS | Status: DC | PRN
  Administered 2021-02-06: 10 mL
  Filled 2021-02-06: qty 10

## 2021-02-06 MED ORDER — HEPARIN SOD (PORK) LOCK FLUSH 100 UNIT/ML IV SOLN
500.0000 [IU] | Freq: Once | INTRAVENOUS | Status: AC | PRN
  Administered 2021-02-06: 500 [IU]
  Filled 2021-02-06: qty 5

## 2021-02-06 MED ORDER — OXYCODONE HCL 5 MG PO TABS: 5.0000 mg | ORAL_TABLET | Freq: Four times a day (QID) | ORAL | 0 refills | Status: DC | PRN

## 2021-02-06 MED ORDER — SODIUM CHLORIDE 0.9 % IV SOLN
Freq: Once | INTRAVENOUS | Status: AC
Start: 2021-02-06 — End: 2021-02-06
  Filled 2021-02-06: qty 250

## 2021-02-06 NOTE — Progress Notes (Signed)
Dose adjusted today to 400 mg for weight increase per provider.

## 2021-02-06 NOTE — Progress Notes (Signed)
[facility] OFFICE PROGRESS NOTE   Diagnosis: Colon cancer  INTERVAL HISTORY:   Ms. Gaillard returns for a scheduled visit.  The abdominal distention has progressed.  She would like another paracentesis.  Leg edema improves at night.  She has completed 1 cycle of Lonsurf.  No nausea, mouth sores, or diarrhea.  Ms. Batta is capable of self-care and ambulatory in the home.  She stays on the couch much of the day.  Objective:  Vital signs in last 24 hours:  Blood pressure (!) 150/88, pulse 94, temperature 98.3 F (36.8 C), temperature source Oral, resp. rate 16, weight 172 lb (78 kg), SpO2 100 %.    HEENT: No thrush or ulcers Resp: Lungs clear bilaterally Cardio: Regular rate and rhythm GI: Markedly distended, the liver edge is palpable in the right upper abdomen Vascular: 1+ edema in the lower leg bilaterally  Skin: Palms without erythema  Portacath/PICC-without erythema  Lab Results:  Lab Results  Component Value Date   WBC 7.1 02/06/2021   HGB 11.4 (L) 02/06/2021   HCT 36.2 02/06/2021   MCV 88.9 02/06/2021   PLT 354 02/06/2021   NEUTROABS 4.7 02/06/2021    CMP  Lab Results  Component Value Date   NA 130 (L) 02/06/2021   K 4.9 02/06/2021   CL 99 02/06/2021   CO2 22 02/06/2021   GLUCOSE 123 (H) 02/06/2021   BUN 11 02/06/2021   CREATININE 0.48 02/06/2021   CALCIUM 8.2 (L) 02/06/2021   PROT 6.4 (L) 02/06/2021   ALBUMIN 2.4 (L) 02/06/2021   AST 57 (H) 02/06/2021   ALT 26 02/06/2021   ALKPHOS 1,014 (H) 02/06/2021   BILITOT 2.6 (H) 02/06/2021   GFRNONAA >60 02/06/2021   GFRAA >60 03/30/2020    Lab Results  Component Value Date   CEA1 62,831.51 (H) 12/12/2020   CEA >965.00 (H) 02/06/2021    Medications: I have reviewed the patient's current medications.   Assessment/Plan: Stage IV adenocarcinoma of the cecum. She presented with a large cecal mass, extensive hepatic metastatic disease; mild thoracic and abdominal adenopathy. She also has  several small nonspecific pulmonary nodules. She was diagnosed in March 2021. Colonoscopy 09/16/2019-fungating mass at the cecum, sessile polyps in the this sigmoid, transverse colon, and hepatic flexure, adenocarcinoma involving cecum biopsy, tubular adenoma and sessile serrated polyps CTs chest, abdomen, and pelvis 10/08/2019-extensive liver metastases, thoracic/abdominal adenopathy, cecal mass, dilated and thin thickened appendix, nonspecific pulmonary nodules Ultrasound-guided liver biopsy 10/13/2019-adenocarcinoma consistent with a metastatic colorectal primary, MSS, tumor mutation burden 3, KRAS Q61, Cycle 1 FOLFOX 10/14/2019 Cycle 2 FOLFOX 10/27/2019 Cycle 3 FOLFOX 11/11/2019, Emend and Decadron added, 5-FU dose escalated Cycle 4 FOLFOX 11/25/2019, Udenyca added Cycle 5 FOLFOX 12/08/2019 Cycle 6 FOLFOX 12/23/2019 CT 01/04/2020-decreased size of multiple hepatic metastases, slight decrease in bulk of cecal mass, stable ileocecal adenopathy Cycle 7 FOLFOX 01/06/2020 Cycle 8 FOLFOX 01/31/2020 Cycle 9 FOLFOX 02/17/2020 (oxaliplatin dose reduced due to borderline ANC, persistent cold sensitivity) Cycle 10 FOLFOX 03/02/2020 Cycle 11 FOLFOX 03/16/2020 Cycle 12 FOLFOX 03/30/2020 (oxaliplatin held secondary to neuropathy) CT abdomen/pelvis 04/19/2020-no significant change in the abnormal soft tissue lesion in the region of the cecal tip involving the ileocecal valve and appendiceal orifice; slight interval progression of numerous hepatic metastases; similar appearance of small lymph nodes in the ileocolic mesentery with adjacent irregular focus of soft tissue also stable Cycle 1 FOLFIRI/Avastin 04/25/2020 Cycle 2 FOLFIRI/Avastin 05/11/2020 Cycle 3 FOLFIRI 05/25/2020 (Avastin held due to proteinuria) Cycle 4 FOLFIRI/Avastin 06/07/2020 Cycle 5 FOLFIRI/Avastin  06/22/2020 CTs 07/05/2020-mild improvement in multifocal liver metastases.  Mass at the base of the cecum is stable.  Persistent thickening of the  appendix. Cycle 6 FOLFIRI/Avastin 07/05/2020 Cycle 7 FOLFIRI/Avastin 07/20/2020 Cycle 8 FOLFIRI/Avastin 08/03/2020 Cycle 9 FOLFIRI 09/05/2020, Avastin held secondary to proteinuria CT chest 09/05/2020-negative for pulmonary embolism, no acute disease, unchanged small pulmonary nodules Cycle 10 FOLFIRI 09/20/2020, Avastin held secondary to proteinuria Cycle 11 FOLFIRI 10/04/2020, Avastin held secondary to proteinuria CTs 10/17/2020-increase in size of liver metastases, stable cecal mass, slight decrease in size of right lower quadrant mesenteric lymph node Cycle 12 FOLFIRI 10/18/2020, Avastin held secondary to proteinuria Cycle 1 FOLFOX/Avastin 11/13/2020 Cycle 2 FOLFOX/Avastin 11/27/2020 Cycle 3 FOLFOX/Avastin 12/12/2020 Cycle 4 FOLFOX/Avastin 12/26/2020 CT 01/04/2021-bulky confluent liver metastases, some are larger and others are stable, increased ascites with suggestion of peritoneal thickening and nodularity, unchanged cecal mass 01/09/2021-every 2-week Avastin continued Cycle 1 Lonsurf 01/15/2021 Therapeutic paracentesis 01/18/2021, 01/24/2021-cytology negative Cycle 2 Lonsurf 02/12/2021 2) Iron deficiency anemia secondary to #1.  3.  Pain secondary to #1-improved 4.  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  Diarrhea 01/20/2020.  C. difficile negative 7.  COVID-19 infection 08/13/2020, antibody infusion therapy on 08/19/2020 8.  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  Inflamed left labial cyst 09/29/2020-doxycycline     Disposition: Ms. Salais appears unchanged.  She completed 1 cycle of Lonsurf.  She tolerated the Lonsurf well.  She continues every 2-week Avastin.  She will begin cycle 2 Lonsurf on 02/12/2021.  We will refer her for a therapeutic paracentesis.  I refilled  her prescription for oxycodone.  Ms. Schroepfer will return for an office visit and Avastin in 2 weeks.  Betsy Coder, MD  02/06/2021  11:55 AM

## 2021-02-06 NOTE — Progress Notes (Signed)
Call from [place] in lab--CEA result per autovalidation is resulting too high. Will dilute sample and re-run with final result coming.

## 2021-02-06 NOTE — Patient Instructions (Signed)
[facility]   Discharge Instructions: Thank you for choosing [facility] to provide your oncology and hematology care.   If you have a lab appointment with the Cancer Center, please go directly to the Cancer Center and check in at the registration area.   Wear comfortable clothing and clothing appropriate for easy access to any Portacath or PICC line.   We strive to give you quality time with your provider. You may need to reschedule your appointment if you arrive late (15 or more minutes).  Arriving late affects you and other patients whose appointments are after yours.  Also, if you miss three or more appointments without notifying the office, you may be dismissed from the clinic at the provider's discretion.      For prescription refill requests, have your pharmacy contact our office and allow 72 hours for refills to be completed.    Today you received the following chemotherapy and/or immunotherapy agents Bevacizumab-bvzr (ZIRABEV).      To help prevent nausea and vomiting after your treatment, we encourage you to take your nausea medication as directed.  BELOW ARE SYMPTOMS THAT SHOULD BE REPORTED IMMEDIATELY: *FEVER GREATER THAN 100.4 F (38 C) OR HIGHER *CHILLS OR SWEATING *NAUSEA AND VOMITING THAT IS NOT CONTROLLED WITH YOUR NAUSEA MEDICATION *UNUSUAL SHORTNESS OF BREATH *UNUSUAL BRUISING OR BLEEDING *URINARY PROBLEMS (pain or burning when urinating, or frequent urination) *BOWEL PROBLEMS (unusual diarrhea, constipation, pain near the anus) TENDERNESS IN MOUTH AND THROAT WITH OR WITHOUT PRESENCE OF ULCERS (sore throat, sores in mouth, or a toothache) UNUSUAL RASH, SWELLING OR PAIN  UNUSUAL VAGINAL DISCHARGE OR ITCHING   Items with * indicate a potential emergency and should be followed up as soon as possible or go to the Emergency Department if any problems should occur.  Please show the CHEMOTHERAPY ALERT CARD or IMMUNOTHERAPY ALERT CARD  at check-in to the Emergency Department and triage nurse.  Should you have questions after your visit or need to cancel or reschedule your appointment, please contact [facility]  Dept: [phone]  and follow the prompts.  Office hours are 8:00 a.m. to 4:30 p.m. Monday - Friday. Please note that voicemails left after 4:00 p.m. may not be returned until the following business day.  We are closed weekends and major holidays. You have access to a nurse at all times for urgent questions. Please call the main number to the clinic Dept: [phone] and follow the prompts.   For any non-urgent questions, you may also contact your provider using MyChart. We now offer e-Visits for anyone 18 and older to request care online for non-urgent symptoms. For details visit [email or web address].   Also download the MyChart app! Go to the app store, search "MyChart", open the app, select [facility], and log in with your MyChart username and password.  Due to Covid, a mask is required upon entering the hospital/clinic. If you do not have a mask, one will be given to you upon arrival. For doctor visits, patients may have 1 support person aged 18 or older with them. For treatment visits, patients cannot have anyone with them due to current Covid guidelines and our immunocompromised population.   Bevacizumab injection What is this medication? BEVACIZUMAB (be va SIZ yoo mab) is a monoclonal antibody. It is used to treat many types of cancer. This medicine may be used for other purposes; ask your health care provider or pharmacist if you have questions. COMMON BRAND NAME(S):   Avastin, MVASI, Zirabev What should I tell my care team before I take this medication? They need to know if you have any of these conditions: diabetes heart disease high blood pressure history of coughing up blood prior anthracycline chemotherapy (e.g., doxorubicin, daunorubicin, epirubicin) recent or ongoing  radiation therapy recent or planning to have surgery stroke an unusual or allergic reaction to bevacizumab, hamster proteins, mouse proteins, other medicines, foods, dyes, or preservatives pregnant or trying to get pregnant breast-feeding How should I use this medication? This medicine is for infusion into a vein. It is given by a health care professional in a hospital or clinic setting. Talk to your pediatrician regarding the use of this medicine in children. Special care may be needed. Overdosage: If you think you have taken too much of this medicine contact a poison control center or emergency room at once. NOTE: This medicine is only for you. Do not share this medicine with others. What if I miss a dose? It is important not to miss your dose. Call your doctor or health care professional if you are unable to keep an appointment. What may interact with this medication? Interactions are not expected. This list may not describe all possible interactions. Give your health care provider a list of all the medicines, herbs, non-prescription drugs, or dietary supplements you use. Also tell them if you smoke, drink alcohol, or use illegal drugs. Some items may interact with your medicine. What should I watch for while using this medication? Your condition will be monitored carefully while you are receiving this medicine. You will need important blood work and urine testing done while you are taking this medicine. This medicine may increase your risk to bruise or bleed. Call your doctor or health care professional if you notice any unusual bleeding. Before having surgery, talk to your health care provider to make sure it is ok. This drug can increase the risk of poor healing of your surgical site or wound. You will need to stop this drug for 28 days before surgery. After surgery, wait at least 28 days before restarting this drug. Make sure the surgical site or wound is healed enough before restarting  this drug. Talk to your health care provider if questions. Do not become pregnant while taking this medicine or for 6 months after stopping it. Women should inform their doctor if they wish to become pregnant or think they might be pregnant. There is a potential for serious side effects to an unborn child. Talk to your health care professional or pharmacist for more information. Do not breast-feed an infant while taking this medicine and for 6 months after the last dose. This medicine has caused ovarian failure in some women. This medicine may interfere with the ability to have a child. You should talk to your doctor or health care professional if you are concerned about your fertility. What side effects may I notice from receiving this medication? Side effects that you should report to your doctor or health care professional as soon as possible: allergic reactions like skin rash, itching or hives, swelling of the face, lips, or tongue chest pain or chest tightness chills coughing up blood high fever seizures severe constipation signs and symptoms of bleeding such as bloody or black, tarry stools; red or dark-brown urine; spitting up blood or brown material that looks like coffee grounds; red spots on the skin; unusual bruising or bleeding from the eye, gums, or nose signs and symptoms of a blood clot such as breathing   problems; chest pain; severe, sudden headache; pain, swelling, warmth in the leg signs and symptoms of a stroke like changes in vision; confusion; trouble speaking or understanding; severe headaches; sudden numbness or weakness of the face, arm or leg; trouble walking; dizziness; loss of balance or coordination stomach pain sweating swelling of legs or ankles vomiting weight gain Side effects that usually do not require medical attention (report to your doctor or health care professional if they continue or are bothersome): back pain changes in taste decreased appetite dry  skin nausea tiredness This list may not describe all possible side effects. Call your doctor for medical advice about side effects. You may report side effects to FDA at 1-800-FDA-1088. Where should I keep my medication? This drug is given in a hospital or clinic and will not be stored at home. NOTE: This sheet is a summary. It may not cover all possible information. If you have questions about this medicine, talk to your doctor, pharmacist, or health care provider.  2022 Elsevier/Gold Standard (2019-04-28 10:50:46)  

## 2021-02-06 NOTE — Progress Notes (Signed)
Per Dr. Benay Spice, ok to treat with urine protein 100.

## 2021-02-07 ENCOUNTER — Ambulatory Visit (HOSPITAL_COMMUNITY)
Admission: RE | Admit: 2021-02-07 | Discharge: 2021-02-07 | Disposition: A | Discharge status: Home / Self Care | Payer: 59 | Source: Ambulatory Visit | Attending: Oncology | Admitting: Oncology

## 2021-02-07 DIAGNOSIS — C19 Malignant neoplasm of rectosigmoid junction: Secondary | ICD-10-CM | POA: Insufficient documentation

## 2021-02-07 MED ORDER — LIDOCAINE HCL 1 % IJ SOLN
INTRAMUSCULAR | Status: AC
  Filled 2021-02-07: qty 20

## 2021-02-07 NOTE — Procedures (Signed)
Ultrasound-guided diagnostic and therapeutic paracentesis performed yielding 3.5 liters of yellow fluid. No immediate complications. A portion of the fluid was sent to the lab for cytology. EBL none.

## 2021-02-08 LAB — CYTOLOGY - NON PAP

## 2021-02-14 ENCOUNTER — Other Ambulatory Visit: Payer: Self-pay | Admitting: Oncology

## 2021-02-20 ENCOUNTER — Encounter: Payer: Self-pay | Admitting: Nurse Practitioner

## 2021-02-20 ENCOUNTER — Other Ambulatory Visit: Payer: Self-pay | Admitting: *Deleted

## 2021-02-20 ENCOUNTER — Other Ambulatory Visit: Payer: Self-pay

## 2021-02-20 ENCOUNTER — Inpatient Hospital Stay (HOSPITAL_BASED_OUTPATIENT_CLINIC_OR_DEPARTMENT_OTHER): Payer: 59 | Admitting: Nurse Practitioner

## 2021-02-20 ENCOUNTER — Inpatient Hospital Stay: Payer: 59

## 2021-02-20 ENCOUNTER — Inpatient Hospital Stay: Payer: 59 | Attending: Physician Assistant

## 2021-02-20 VITALS — BP 145/70 | HR 90 | Temp 98.1°F | Resp 18 | Ht 62.0 in | Wt 177.2 lb

## 2021-02-20 VITALS — BP 157/93

## 2021-02-20 DIAGNOSIS — R197 Diarrhea, unspecified: Secondary | ICD-10-CM | POA: Diagnosis not present

## 2021-02-20 DIAGNOSIS — R110 Nausea: Secondary | ICD-10-CM | POA: Diagnosis not present

## 2021-02-20 DIAGNOSIS — T451X5D Adverse effect of antineoplastic and immunosuppressive drugs, subsequent encounter: Secondary | ICD-10-CM | POA: Insufficient documentation

## 2021-02-20 DIAGNOSIS — G620 Drug-induced polyneuropathy: Secondary | ICD-10-CM | POA: Diagnosis not present

## 2021-02-20 DIAGNOSIS — G893 Neoplasm related pain (acute) (chronic): Secondary | ICD-10-CM | POA: Diagnosis not present

## 2021-02-20 DIAGNOSIS — C19 Malignant neoplasm of rectosigmoid junction: Secondary | ICD-10-CM

## 2021-02-20 DIAGNOSIS — R809 Proteinuria, unspecified: Secondary | ICD-10-CM | POA: Diagnosis not present

## 2021-02-20 DIAGNOSIS — Z452 Encounter for adjustment and management of vascular access device: Secondary | ICD-10-CM | POA: Diagnosis not present

## 2021-02-20 DIAGNOSIS — D630 Anemia in neoplastic disease: Secondary | ICD-10-CM | POA: Diagnosis not present

## 2021-02-20 DIAGNOSIS — Z5112 Encounter for antineoplastic immunotherapy: Secondary | ICD-10-CM | POA: Diagnosis present

## 2021-02-20 DIAGNOSIS — C180 Malignant neoplasm of cecum: Secondary | ICD-10-CM | POA: Diagnosis present

## 2021-02-20 LAB — CBC WITH DIFFERENTIAL (CANCER CENTER ONLY)
Abs Immature Granulocytes: 0.04 10*3/uL (ref 0.00–0.07)
Basophils Absolute: 0.1 10*3/uL (ref 0.0–0.1)
Basophils Relative: 1 %
Eosinophils Absolute: 0 10*3/uL (ref 0.0–0.5)
Eosinophils Relative: 0 %
HCT: 36.6 % (ref 36.0–46.0)
Hemoglobin: 11.6 g/dL — ABNORMAL LOW (ref 12.0–15.0)
Immature Granulocytes: 0 %
Lymphocytes Relative: 13 %
Lymphs Abs: 1.3 10*3/uL (ref 0.7–4.0)
MCH: 27.6 pg (ref 26.0–34.0)
MCHC: 31.7 g/dL (ref 30.0–36.0)
MCV: 87.1 fL (ref 80.0–100.0)
Monocytes Absolute: 0.5 10*3/uL (ref 0.1–1.0)
Monocytes Relative: 6 %
Neutro Abs: 7.6 10*3/uL (ref 1.7–7.7)
Neutrophils Relative %: 80 %
Platelet Count: 337 10*3/uL (ref 150–400)
RBC: 4.2 MIL/uL (ref 3.87–5.11)
RDW: 18.9 % — ABNORMAL HIGH (ref 11.5–15.5)
WBC Count: 9.6 10*3/uL (ref 4.0–10.5)
nRBC: 0.5 % — ABNORMAL HIGH (ref 0.0–0.2)

## 2021-02-20 LAB — CMP (CANCER CENTER ONLY)
ALT: 20 U/L (ref 0–44)
AST: 58 U/L — ABNORMAL HIGH (ref 15–41)
Albumin: 2.4 g/dL — ABNORMAL LOW (ref 3.5–5.0)
Alkaline Phosphatase: 944 U/L — ABNORMAL HIGH (ref 38–126)
Anion gap: 11 (ref 5–15)
BUN: 14 mg/dL (ref 8–23)
CO2: 22 mmol/L (ref 22–32)
Calcium: 8.4 mg/dL — ABNORMAL LOW (ref 8.9–10.3)
Chloride: 98 mmol/L (ref 98–111)
Creatinine: 0.58 mg/dL (ref 0.44–1.00)
GFR, Estimated: >60 mL/min (ref >60)
Glucose, Bld: 95 mg/dL (ref 70–99)
Potassium: 5 mmol/L (ref 3.5–5.1)
Sodium: 131 mmol/L — ABNORMAL LOW (ref 135–145)
Total Bilirubin: 2 mg/dL — ABNORMAL HIGH (ref 0.3–1.2)
Total Protein: 6.5 g/dL (ref 6.5–8.1)

## 2021-02-20 LAB — TOTAL PROTEIN, URINE DIPSTICK: Protein, ur: 100 mg/dL — AB

## 2021-02-20 LAB — CEA (ACCESS): CEA (CHCC): 39160 ng/mL — ABNORMAL HIGH (ref 0.00–5.00)

## 2021-02-20 MED ORDER — LONSURF 20-8.19 MG PO TABS: 60.0000 mg/m2 | ORAL_TABLET | Freq: Two times a day (BID) | ORAL | 0 refills | Status: DC

## 2021-02-20 MED ORDER — HEPARIN SOD (PORK) LOCK FLUSH 100 UNIT/ML IV SOLN
500.0000 [IU] | Freq: Once | INTRAVENOUS | Status: AC | PRN
  Administered 2021-02-20: 500 [IU]
  Filled 2021-02-20: qty 5

## 2021-02-20 MED ORDER — SODIUM CHLORIDE 0.9% FLUSH
10.0000 mL | INTRAVENOUS | Status: DC | PRN
Start: 2021-02-20 — End: 2021-02-20
  Administered 2021-02-20: 10 mL
  Filled 2021-02-20: qty 10

## 2021-02-20 MED ORDER — SODIUM CHLORIDE 0.9 % IV SOLN
5.0000 mg/kg | Freq: Once | INTRAVENOUS | Status: AC
  Administered 2021-02-20: 350 mg via INTRAVENOUS
  Filled 2021-02-20: qty 14

## 2021-02-20 MED ORDER — PROCHLORPERAZINE MALEATE 10 MG PO TABS: 10.0000 mg | ORAL_TABLET | Freq: Four times a day (QID) | ORAL | 2 refills | Status: AC | PRN

## 2021-02-20 MED ORDER — SODIUM CHLORIDE 0.9 % IV SOLN
Freq: Once | INTRAVENOUS | Status: AC
  Filled 2021-02-20: qty 250

## 2021-02-20 NOTE — Patient Instructions (Signed)
[facility]   Discharge Instructions: Thank you for choosing [facility] to provide your oncology and hematology care.   If you have a lab appointment with the Cancer Center, please go directly to the Cancer Center and check in at the registration area.   Wear comfortable clothing and clothing appropriate for easy access to any Portacath or PICC line.   We strive to give you quality time with your provider. You may need to reschedule your appointment if you arrive late (15 or more minutes).  Arriving late affects you and other patients whose appointments are after yours.  Also, if you miss three or more appointments without notifying the office, you may be dismissed from the clinic at the provider's discretion.      For prescription refill requests, have your pharmacy contact our office and allow 72 hours for refills to be completed.    Today you received the following chemotherapy and/or immunotherapy agents Bevacizumab-bvzr (ZIRABEV).      To help prevent nausea and vomiting after your treatment, we encourage you to take your nausea medication as directed.  BELOW ARE SYMPTOMS THAT SHOULD BE REPORTED IMMEDIATELY: *FEVER GREATER THAN 100.4 F (38 C) OR HIGHER *CHILLS OR SWEATING *NAUSEA AND VOMITING THAT IS NOT CONTROLLED WITH YOUR NAUSEA MEDICATION *UNUSUAL SHORTNESS OF BREATH *UNUSUAL BRUISING OR BLEEDING *URINARY PROBLEMS (pain or burning when urinating, or frequent urination) *BOWEL PROBLEMS (unusual diarrhea, constipation, pain near the anus) TENDERNESS IN MOUTH AND THROAT WITH OR WITHOUT PRESENCE OF ULCERS (sore throat, sores in mouth, or a toothache) UNUSUAL RASH, SWELLING OR PAIN  UNUSUAL VAGINAL DISCHARGE OR ITCHING   Items with * indicate a potential emergency and should be followed up as soon as possible or go to the Emergency Department if any problems should occur.  Please show the CHEMOTHERAPY ALERT CARD or IMMUNOTHERAPY ALERT CARD  at check-in to the Emergency Department and triage nurse.  Should you have questions after your visit or need to cancel or reschedule your appointment, please contact [facility]  Dept: [phone]  and follow the prompts.  Office hours are 8:00 a.m. to 4:30 p.m. Monday - Friday. Please note that voicemails left after 4:00 p.m. may not be returned until the following business day.  We are closed weekends and major holidays. You have access to a nurse at all times for urgent questions. Please call the main number to the clinic Dept: [phone] and follow the prompts.   For any non-urgent questions, you may also contact your provider using MyChart. We now offer e-Visits for anyone 18 and older to request care online for non-urgent symptoms. For details visit [email or web address].   Also download the MyChart app! Go to the app store, search "MyChart", open the app, select [facility], and log in with your MyChart username and password.  Due to Covid, a mask is required upon entering the hospital/clinic. If you do not have a mask, one will be given to you upon arrival. For doctor visits, patients may have 1 support person aged 18 or older with them. For treatment visits, patients cannot have anyone with them due to current Covid guidelines and our immunocompromised population.   Bevacizumab injection What is this medication? BEVACIZUMAB (be va SIZ yoo mab) is a monoclonal antibody. It is used to treat many types of cancer. This medicine may be used for other purposes; ask your health care provider or pharmacist if you have questions. COMMON BRAND NAME(S):   Avastin, MVASI, Zirabev What should I tell my care team before I take this medication? They need to know if you have any of these conditions: diabetes heart disease high blood pressure history of coughing up blood prior anthracycline chemotherapy (e.g., doxorubicin, daunorubicin, epirubicin) recent or ongoing  radiation therapy recent or planning to have surgery stroke an unusual or allergic reaction to bevacizumab, hamster proteins, mouse proteins, other medicines, foods, dyes, or preservatives pregnant or trying to get pregnant breast-feeding How should I use this medication? This medicine is for infusion into a vein. It is given by a health care professional in a hospital or clinic setting. Talk to your pediatrician regarding the use of this medicine in children. Special care may be needed. Overdosage: If you think you have taken too much of this medicine contact a poison control center or emergency room at once. NOTE: This medicine is only for you. Do not share this medicine with others. What if I miss a dose? It is important not to miss your dose. Call your doctor or health care professional if you are unable to keep an appointment. What may interact with this medication? Interactions are not expected. This list may not describe all possible interactions. Give your health care provider a list of all the medicines, herbs, non-prescription drugs, or dietary supplements you use. Also tell them if you smoke, drink alcohol, or use illegal drugs. Some items may interact with your medicine. What should I watch for while using this medication? Your condition will be monitored carefully while you are receiving this medicine. You will need important blood work and urine testing done while you are taking this medicine. This medicine may increase your risk to bruise or bleed. Call your doctor or health care professional if you notice any unusual bleeding. Before having surgery, talk to your health care provider to make sure it is ok. This drug can increase the risk of poor healing of your surgical site or wound. You will need to stop this drug for 28 days before surgery. After surgery, wait at least 28 days before restarting this drug. Make sure the surgical site or wound is healed enough before restarting  this drug. Talk to your health care provider if questions. Do not become pregnant while taking this medicine or for 6 months after stopping it. Women should inform their doctor if they wish to become pregnant or think they might be pregnant. There is a potential for serious side effects to an unborn child. Talk to your health care professional or pharmacist for more information. Do not breast-feed an infant while taking this medicine and for 6 months after the last dose. This medicine has caused ovarian failure in some women. This medicine may interfere with the ability to have a child. You should talk to your doctor or health care professional if you are concerned about your fertility. What side effects may I notice from receiving this medication? Side effects that you should report to your doctor or health care professional as soon as possible: allergic reactions like skin rash, itching or hives, swelling of the face, lips, or tongue chest pain or chest tightness chills coughing up blood high fever seizures severe constipation signs and symptoms of bleeding such as bloody or black, tarry stools; red or dark-brown urine; spitting up blood or brown material that looks like coffee grounds; red spots on the skin; unusual bruising or bleeding from the eye, gums, or nose signs and symptoms of a blood clot such as breathing   problems; chest pain; severe, sudden headache; pain, swelling, warmth in the leg signs and symptoms of a stroke like changes in vision; confusion; trouble speaking or understanding; severe headaches; sudden numbness or weakness of the face, arm or leg; trouble walking; dizziness; loss of balance or coordination stomach pain sweating swelling of legs or ankles vomiting weight gain Side effects that usually do not require medical attention (report to your doctor or health care professional if they continue or are bothersome): back pain changes in taste decreased appetite dry  skin nausea tiredness This list may not describe all possible side effects. Call your doctor for medical advice about side effects. You may report side effects to FDA at 1-800-FDA-1088. Where should I keep my medication? This drug is given in a hospital or clinic and will not be stored at home. NOTE: This sheet is a summary. It may not cover all possible information. If you have questions about this medicine, talk to your doctor, pharmacist, or health care provider.  2022 Elsevier/Gold Standard (2019-04-28 10:50:46)  

## 2021-02-20 NOTE — Progress Notes (Signed)
[facility] OFFICE PROGRESS NOTE   Diagnosis: Colon cancer  INTERVAL HISTORY:   Rachel Frank returns as scheduled.  She began cycle 2 Lonsurf 02/12/2021.  She continues every 2-week Avastin.  She has periodic nausea, dry heaves.  Stools are "a little loose".  No mouth sores.  No rash.  She would like to have a paracentesis.  Objective:  Vital signs in last 24 hours:  Blood pressure (!) 145/70, pulse 90, temperature 98.1 F (36.7 C), temperature source Oral, resp. rate 18, height _0  (1.575 m), weight 177 lb 3.2 oz (80.4 kg), SpO2 99 %.    HEENT: No thrush or ulcers. Resp: Lungs clear bilaterally. Cardio: Regular rate and rhythm. GI: Abdomen is markedly distended. Vascular: 2+ pitting edema lower leg bilaterally. Port-A-Cath without erythema.  Lab Results:  Lab Results  Component Value Date   WBC 9.6 02/20/2021   HGB 11.6 (L) 02/20/2021   HCT 36.6 02/20/2021   MCV 87.1 02/20/2021   PLT 337 02/20/2021   NEUTROABS 7.6 02/20/2021    Imaging:  No results found.  Medications: I have reviewed the patient's current medications.  Assessment/Plan: Stage IV adenocarcinoma of the cecum. She presented with a large cecal mass, extensive hepatic metastatic disease; mild thoracic and abdominal adenopathy. She also has several small nonspecific pulmonary nodules. She was diagnosed in March 2021. Colonoscopy 09/16/2019-fungating mass at the cecum, sessile polyps in the this sigmoid, transverse colon, and hepatic flexure, adenocarcinoma involving cecum biopsy, tubular adenoma and sessile serrated polyps CTs chest, abdomen, and pelvis 10/08/2019-extensive liver metastases, thoracic/abdominal adenopathy, cecal mass, dilated and thin thickened appendix, nonspecific pulmonary nodules Ultrasound-guided liver biopsy 10/13/2019-adenocarcinoma consistent with a metastatic colorectal primary, MSS, tumor mutation burden 3, KRAS Q61, Cycle 1 FOLFOX 10/14/2019 Cycle 2 FOLFOX  10/27/2019 Cycle 3 FOLFOX 11/11/2019, Emend and Decadron added, 5-FU dose escalated Cycle 4 FOLFOX 11/25/2019, Udenyca added Cycle 5 FOLFOX 12/08/2019 Cycle 6 FOLFOX 12/23/2019 CT 01/04/2020-decreased size of multiple hepatic metastases, slight decrease in bulk of cecal mass, stable ileocecal adenopathy Cycle 7 FOLFOX 01/06/2020 Cycle 8 FOLFOX 01/31/2020 Cycle 9 FOLFOX 02/17/2020 (oxaliplatin dose reduced due to borderline ANC, persistent cold sensitivity) Cycle 10 FOLFOX 03/02/2020 Cycle 11 FOLFOX 03/16/2020 Cycle 12 FOLFOX 03/30/2020 (oxaliplatin held secondary to neuropathy) CT abdomen/pelvis 04/19/2020-no significant change in the abnormal soft tissue lesion in the region of the cecal tip involving the ileocecal valve and appendiceal orifice; slight interval progression of numerous hepatic metastases; similar appearance of small lymph nodes in the ileocolic mesentery with adjacent irregular focus of soft tissue also stable Cycle 1 FOLFIRI/Avastin 04/25/2020 Cycle 2 FOLFIRI/Avastin 05/11/2020 Cycle 3 FOLFIRI 05/25/2020 (Avastin held due to proteinuria) Cycle 4 FOLFIRI/Avastin 06/07/2020 Cycle 5 FOLFIRI/Avastin 06/22/2020 CTs 07/05/2020-mild improvement in multifocal liver metastases.  Mass at the base of the cecum is stable.  Persistent thickening of the appendix. Cycle 6 FOLFIRI/Avastin 07/05/2020 Cycle 7 FOLFIRI/Avastin 07/20/2020 Cycle 8 FOLFIRI/Avastin 08/03/2020 Cycle 9 FOLFIRI 09/05/2020, Avastin held secondary to proteinuria CT chest 09/05/2020-negative for pulmonary embolism, no acute disease, unchanged small pulmonary nodules Cycle 10 FOLFIRI 09/20/2020, Avastin held secondary to proteinuria Cycle 11 FOLFIRI 10/04/2020, Avastin held secondary to proteinuria CTs 10/17/2020-increase in size of liver metastases, stable cecal mass, slight decrease in size of right lower quadrant mesenteric lymph node Cycle 12 FOLFIRI 10/18/2020, Avastin held secondary to proteinuria Cycle 1 FOLFOX/Avastin 11/13/2020 Cycle 2  FOLFOX/Avastin 11/27/2020 Cycle 3 FOLFOX/Avastin 12/12/2020 Cycle 4 FOLFOX/Avastin 12/26/2020 CT 01/04/2021-bulky confluent liver metastases, some are larger and others are stable, increased ascites with  suggestion of peritoneal thickening and nodularity, unchanged cecal mass 01/09/2021-every 2-week Avastin continued Cycle 1 Lonsurf 01/15/2021 Therapeutic paracentesis 01/18/2021, 01/24/2021-cytology negative Cycle 2 Lonsurf 02/12/2021 2) Iron deficiency anemia secondary to #1.  3.  Pain secondary to #1-improved 4.  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  Diarrhea 01/20/2020.  C. difficile negative 7.  COVID-19 infection 08/13/2020, antibody infusion therapy on 08/19/2020 8.  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  Inflamed left labial cyst 09/29/2020-doxycycline      Disposition: Rachel Frank appears unchanged.  She is completing cycle 2 Lonsurf.  Plan for Avastin today.  Restaging CTs prior to next office visit.  We reviewed the CBC and chemistry panel from today.  Labs adequate to proceed with Avastin.  Bilirubin is mildly elevated, improved as compared to 2 weeks ago.  We are making arrangements for a paracentesis later this week.  She will return for lab, follow-up, Avastin in 2 weeks with CT scans a few days prior.  She will contact the office in the interim with any problems.    Ned Card ANP/GNP-BC   02/20/2021  1:59 PM

## 2021-02-20 NOTE — Patient Instructions (Signed)
Implanted Port Home Guide An implanted port is a device that is placed under the skin. It is usually placed in the chest. The device can be used to give IV medicine, to take blood, or for dialysis. You may have an implanted port if: You need IV medicine that would be irritating to the small veins in your hands or arms. You need IV medicines, such as antibiotics, for a long period of time. You need IV nutrition for a long period of time. You need dialysis. When you have a port, your health care provider can choose to use the port instead of veins in your arms for these procedures. You may have fewer limitations when using a port than you would if you used other types of long-term IVs, and you will likely be able to return to normal activities afteryour incision heals. An implanted port has two main parts: Reservoir. The reservoir is the part where a needle is inserted to give medicines or draw blood. The reservoir is round. After it is placed, it appears as a small, raised area under your skin. Catheter. The catheter is a thin, flexible tube that connects the reservoir to a vein. Medicine that is inserted into the reservoir goes into the catheter and then into the vein. How is my port accessed? To access your port: A numbing cream may be placed on the skin over the port site. Your health care provider will put on a mask and sterile gloves. The skin over your port will be cleaned carefully with a germ-killing soap and allowed to dry. Your health care provider will gently pinch the port and insert a needle into it. Your health care provider will check for a blood return to make sure the port is in the vein and is not clogged. If your port needs to remain accessed to get medicine continuously (constant infusion), your health care provider will place a clear bandage (dressing) over the needle site. The dressing and needle will need to be changed every week, or as told by your health care provider. What  is flushing? Flushing helps keep the port from getting clogged. Follow instructions from your health care provider about how and when to flush the port. Ports are usually flushed with saline solution or a medicine called heparin. The need for flushing will depend on how the port is used: If the port is only used from time to time to give medicines or draw blood, the port may need to be flushed: Before and after medicines have been given. Before and after blood has been drawn. As part of routine maintenance. Flushing may be recommended every 4-6 weeks. If a constant infusion is running, the port may not need to be flushed. Throw away any syringes in a disposal container that is meant for sharp items (sharps container). You can buy a sharps container from a pharmacy, or you can make one by using an empty hard plastic bottle with a cover. How long will my port stay implanted? The port can stay in for as long as your health care provider thinks it is needed. When it is time for the port to come out, a surgery will be done to remove it. The surgery will be similar to the procedure that was done to putthe port in. Follow these instructions at home:  Flush your port as told by your health care provider. If you need an infusion over several days, follow instructions from your health care provider about how to take   care of your port site. Make sure you: Wash your hands with soap and water before you change your dressing. If soap and water are not available, use alcohol-based hand sanitizer. Change your dressing as told by your health care provider. Place any used dressings or infusion bags into a plastic bag. Throw that bag in the trash. Keep the dressing that covers the needle clean and dry. Do not get it wet. Do not use scissors or sharp objects near the tube. Keep the tube clamped, unless it is being used. Check your port site every day for signs of infection. Check for: Redness, swelling, or  pain. Fluid or blood. Pus or a bad smell. Protect the skin around the port site. Avoid wearing bra straps that rub or irritate the site. Protect the skin around your port from seat belts. Place a soft pad over your chest if needed. Bathe or shower as told by your health care provider. The site may get wet as long as you are not actively receiving an infusion. Return to your normal activities as told by your health care provider. Ask your health care provider what activities are safe for you. Carry a medical alert card or wear a medical alert bracelet at all times. This will let health care providers know that you have an implanted port in case of an emergency. Get help right away if: You have redness, swelling, or pain at the port site. You have fluid or blood coming from your port site. You have pus or a bad smell coming from the port site. You have a fever. Summary Implanted ports are usually placed in the chest for long-term IV access. Follow instructions from your health care provider about flushing the port and changing bandages (dressings). Take care of the area around your port by avoiding clothing that puts pressure on the area, and by watching for signs of infection. Protect the skin around your port from seat belts. Place a soft pad over your chest if needed. Get help right away if you have a fever or you have redness, swelling, pain, drainage, or a bad smell at the port site. This information is not intended to replace advice given to you by your health care provider. Make sure you discuss any questions you have with your healthcare provider. Document Revised: 11/15/2019 Document Reviewed: 11/15/2019 Elsevier Patient Education  2022 Elsevier Inc.  

## 2021-02-20 NOTE — Telephone Encounter (Signed)
Received faxed refill request for Lonsurf from [facility].

## 2021-02-20 NOTE — Progress Notes (Signed)
Per Ned Card, NP ok to treat with urine protein 100.

## 2021-02-20 NOTE — Progress Notes (Signed)
Ok to treat per Ned Card with UP 100 and keep dose at 350 mg due to ascites.

## 2021-02-22 ENCOUNTER — Ambulatory Visit (HOSPITAL_COMMUNITY)
Admission: RE | Admit: 2021-02-22 | Discharge: 2021-02-22 | Disposition: A | Discharge status: Home / Self Care | Payer: 59 | Source: Ambulatory Visit | Attending: Nurse Practitioner | Admitting: Nurse Practitioner

## 2021-02-22 ENCOUNTER — Other Ambulatory Visit: Payer: Self-pay

## 2021-02-22 DIAGNOSIS — C19 Malignant neoplasm of rectosigmoid junction: Secondary | ICD-10-CM | POA: Insufficient documentation

## 2021-02-22 MED ORDER — LIDOCAINE HCL 1 % IJ SOLN
INTRAMUSCULAR | Status: AC
  Filled 2021-02-22: qty 20

## 2021-02-22 NOTE — Procedures (Signed)
Ultrasound-guided diagnostic and therapeutic paracentesis performed yielding 5 liters (maximum ordered) of yellow fluid. No immediate complications. A portion of the fluid was sent to the lab for cytology. EBL none.   

## 2021-02-23 ENCOUNTER — Telehealth: Payer: Self-pay | Admitting: *Deleted

## 2021-02-23 LAB — CYTOLOGY - NON PAP

## 2021-02-23 NOTE — Telephone Encounter (Signed)
Called patient w/CT scan at [facility] on 03/02/21 at 1:15/1:30 pm. NPO 4 hours prior and oral contrast at 1130 and 1230 that day. She will pick this up day before scan. She does wish port to be used. Scheduling message sent for this. She reports her paracentesis got of 5L fluid on 8/11 and was told she still had more. Asking when she can have another paracentesis?

## 2021-02-26 ENCOUNTER — Telehealth: Payer: Self-pay | Admitting: *Deleted

## 2021-02-26 DIAGNOSIS — C19 Malignant neoplasm of rectosigmoid junction: Secondary | ICD-10-CM

## 2021-02-26 NOTE — Telephone Encounter (Signed)
Per Dr. Benay Spice, Rachel Frank to have paracentesis any day this week if she desires. Left VM for patient that OK to do paracentesis this week and to call back for preferred days this week. Order placed.

## 2021-02-26 NOTE — Telephone Encounter (Signed)
Scheduled paracentesis at [facility] for 02/27/21 at 1:45/1400 and daughter notified. Patient is also requesting a transporter wheelchair to take her out of home. Too difficult for her to ambulate very far due to pitting edema in feet and fatigue. She will pick up a script tomorrow.

## 2021-02-27 ENCOUNTER — Ambulatory Visit (HOSPITAL_COMMUNITY)
Admission: RE | Admit: 2021-02-27 | Discharge: 2021-02-27 | Disposition: A | Discharge status: Home / Self Care | Payer: 59 | Source: Ambulatory Visit | Attending: Oncology | Admitting: Oncology

## 2021-02-27 ENCOUNTER — Other Ambulatory Visit: Payer: Self-pay

## 2021-02-27 DIAGNOSIS — C19 Malignant neoplasm of rectosigmoid junction: Secondary | ICD-10-CM | POA: Diagnosis present

## 2021-02-27 IMAGING — US IR IMAGING GUIDED PORT INSERTION
1 series · 2 of 2 positions shown · non-contrast
Comparison: none

CLINICAL DATA: Metastatic colorectal carcinoma and need for porta
cath for chemotherapy.

[Series 1: ir imaging guided port insertion · 2 of 2 slices shown]
[im 1/2]
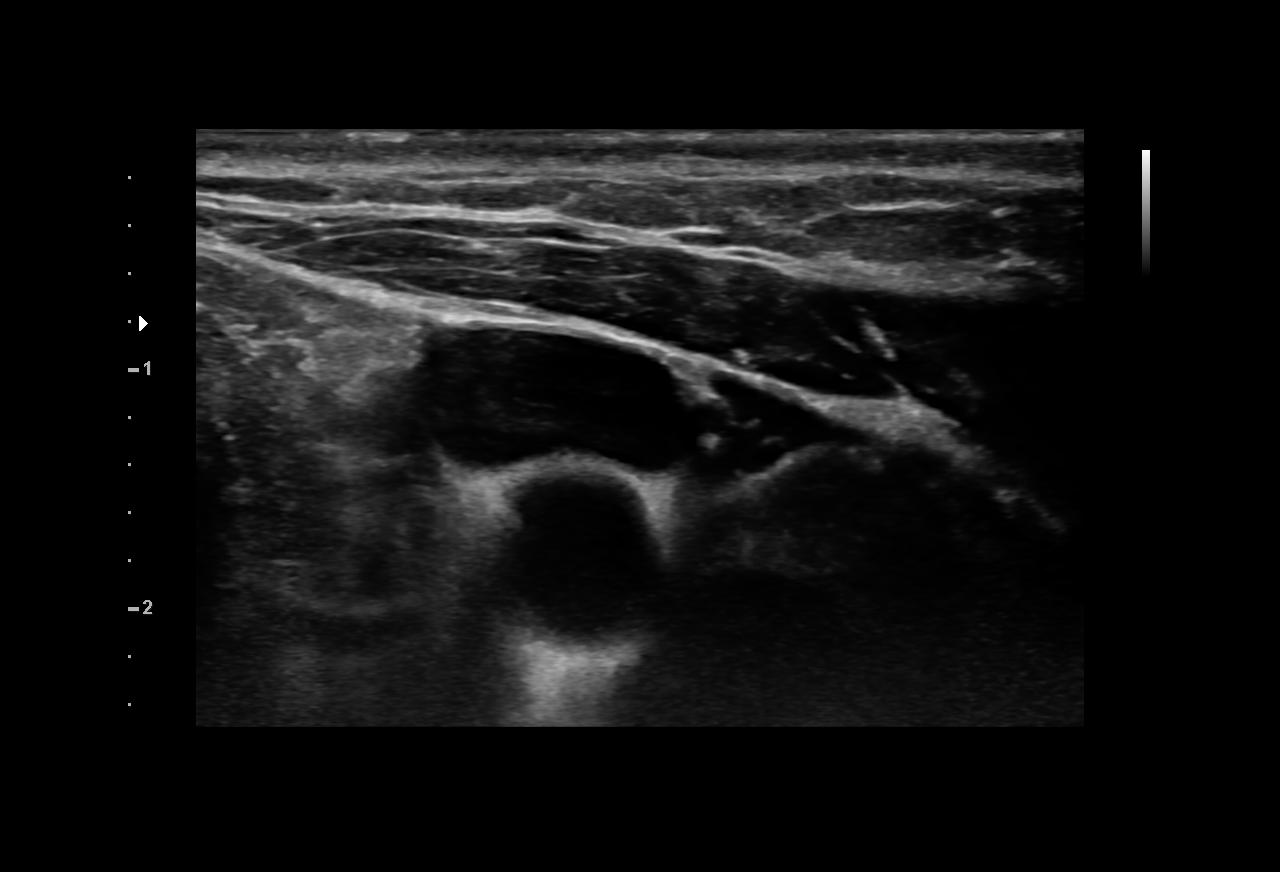
[im 2/2]
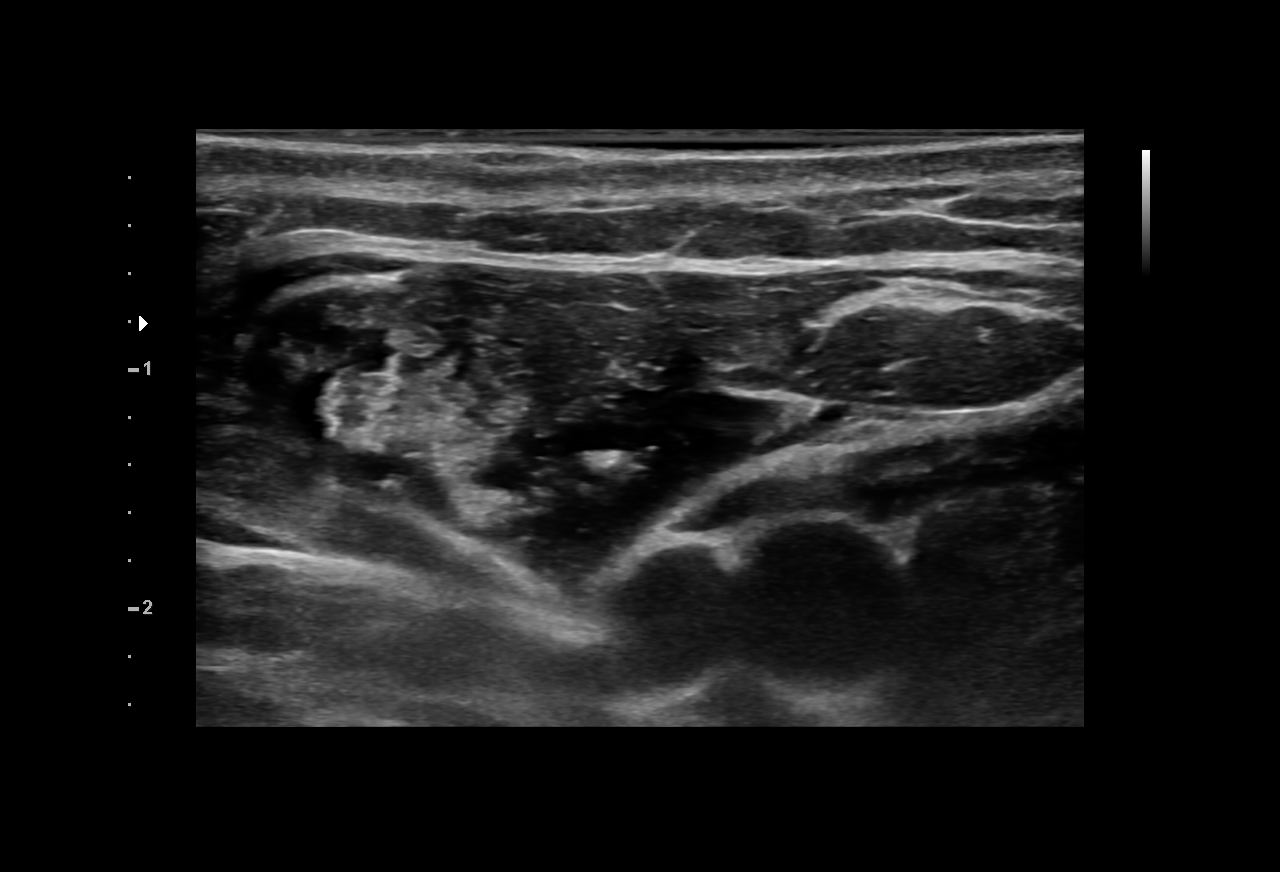

[2 of 2 positions shown; findings below may reference images not displayed]

EXAM:
IMPLANTED PORT A CATH PLACEMENT WITH ULTRASOUND AND FLUOROSCOPIC
GUIDANCE

ANESTHESIA/SEDATION:
1.0 mg IV Versed; 75 mcg IV Fentanyl

Total Moderate Sedation Time:  36 minutes

The patient's level of consciousness and physiologic status were
continuously monitored during the procedure by Radiology nursing.

Additional Medications: 2 g IV Ancef.

FLUOROSCOPY TIME:  36 seconds.  4.7 mGy.

PROCEDURE:
The procedure, risks, benefits, and alternatives were explained to
the patient. Questions regarding the procedure were encouraged and
answered. The patient understands and consents to the procedure. A
time-out was performed prior to initiating the procedure.

Ultrasound was utilized to confirm patency of the right internal
jugular vein. The right neck and chest were prepped with
chlorhexidine in a sterile fashion, and a sterile drape was applied
covering the operative field. Maximum barrier sterile technique with
sterile gowns and gloves were used for the procedure. Local
anesthesia was provided with 1% lidocaine.

After creating a small venotomy incision, a 21 gauge needle was
advanced into the right internal jugular vein under direct,
real-time ultrasound guidance. Ultrasound image documentation was
performed. After securing guidewire access, an 8 Fr dilator was
placed. A J-wire was kinked to measure appropriate catheter length.

A subcutaneous port pocket was then created along the upper chest
wall utilizing sharp and blunt dissection. Portable cautery was
utilized. The pocket was irrigated with sterile saline.

A single lumen power injectable port was chosen for placement. The 8
Fr catheter was tunneled from the port pocket site to the venotomy
incision. The port was placed in the pocket. External catheter was
trimmed to appropriate length based on guidewire measurement.

At the venotomy, an 8 Fr peel-away sheath was placed over a
guidewire. The catheter was then placed through the sheath and the
sheath removed. Final catheter positioning was confirmed and
documented with a fluoroscopic spot image. The port was accessed
with a needle and aspirated and flushed with heparinized saline. The
access needle was removed.

The venotomy and port pocket incisions were closed with subcutaneous
3-0 Monocryl and subcuticular 4-0 Vicryl. Dermabond was applied to
both incisions.

COMPLICATIONS:
COMPLICATIONS
None
FINDINGS: After catheter placement, the tip lies at the Torija junction.
The catheter aspirates normally and is ready for immediate use.
IMPRESSION: Placement of single lumen port a cath via right internal jugular
vein. The catheter tip lies at the Torija junction. A power
injectable port a cath was placed and is ready for immediate use.

## 2021-02-27 IMAGING — US IR US GUIDANCE
1 series · 9 of 9 positions shown · non-contrast
Comparison: none

CLINICAL DATA: Cecal carcinoma with multiple confluent large masses
throughout the liver consistent with metastatic disease. Biopsy of
the liver has been requested to confirm metastatic disease.

[Series 1: ir us guidance · 9 of 9 slices shown]
[im 1/9]
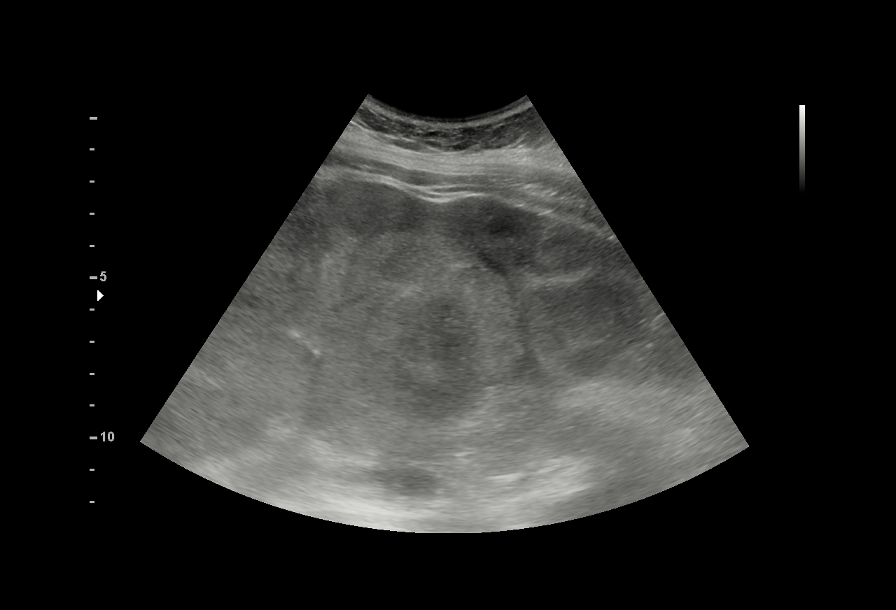
[im 2/9]
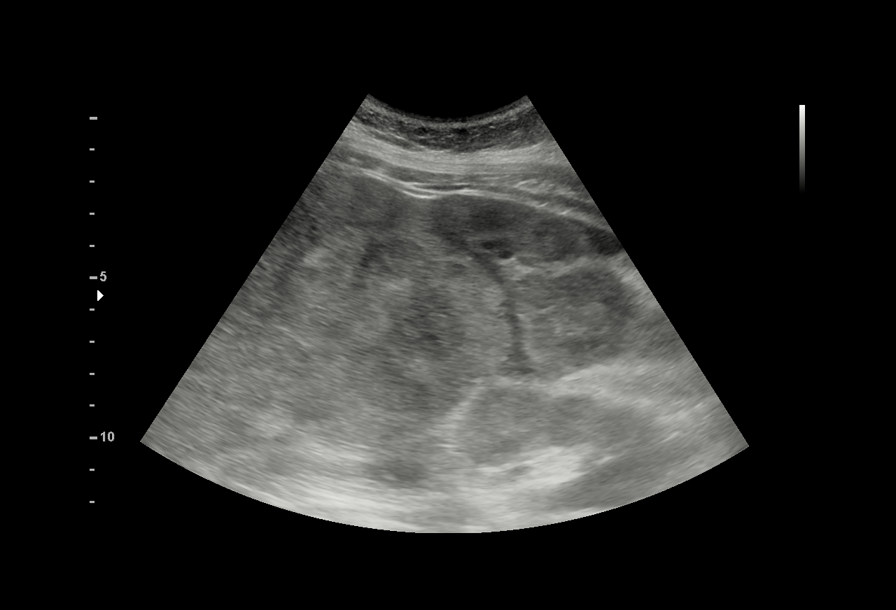
[im 3/9]
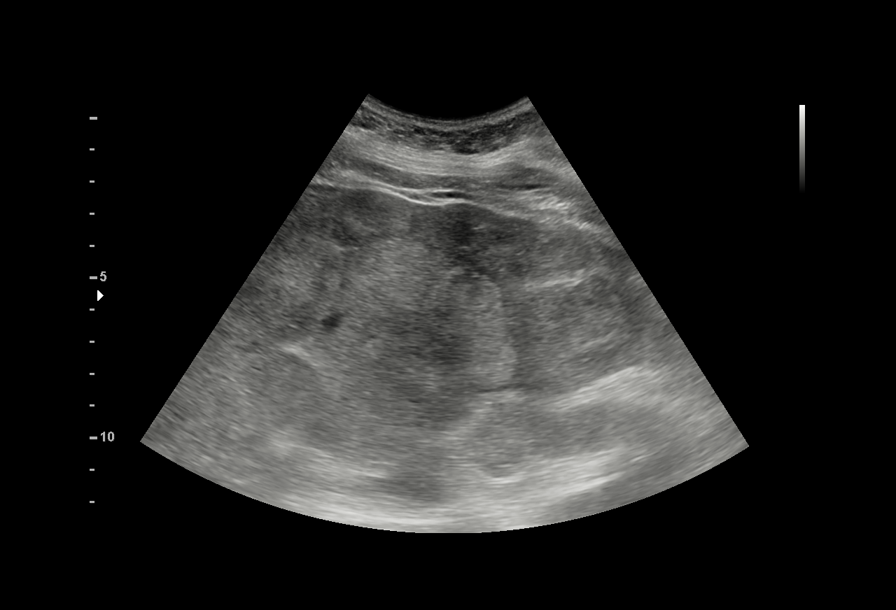
[im 4/9]
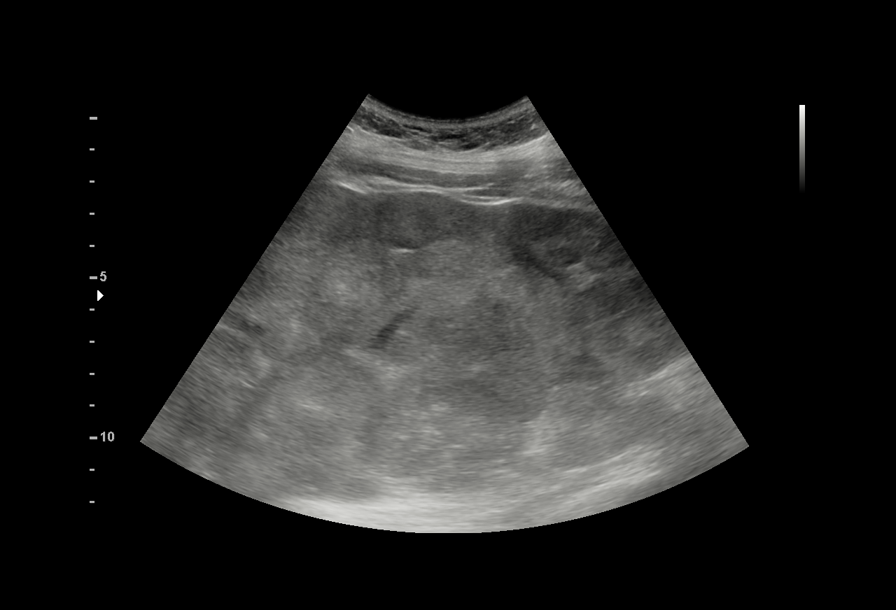
[im 5/9]
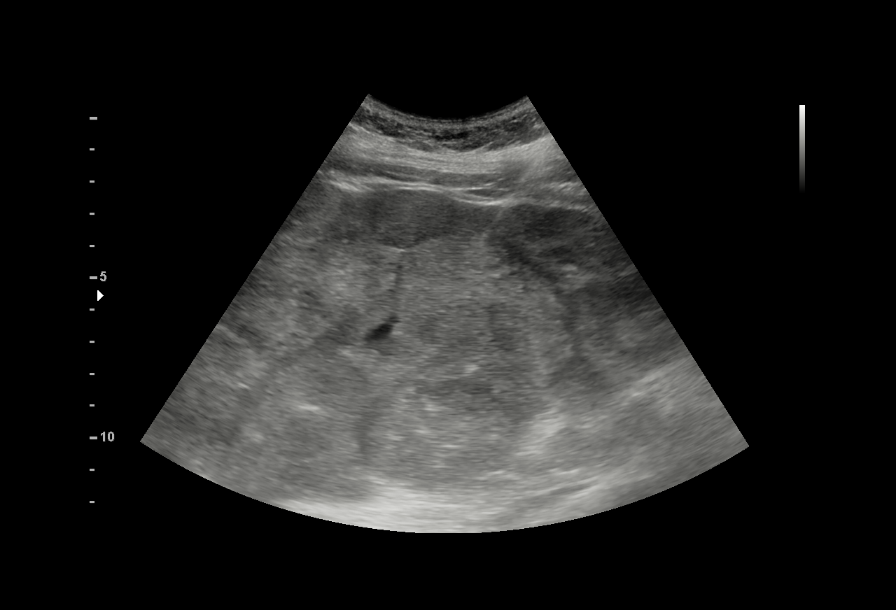
[im 6/9]
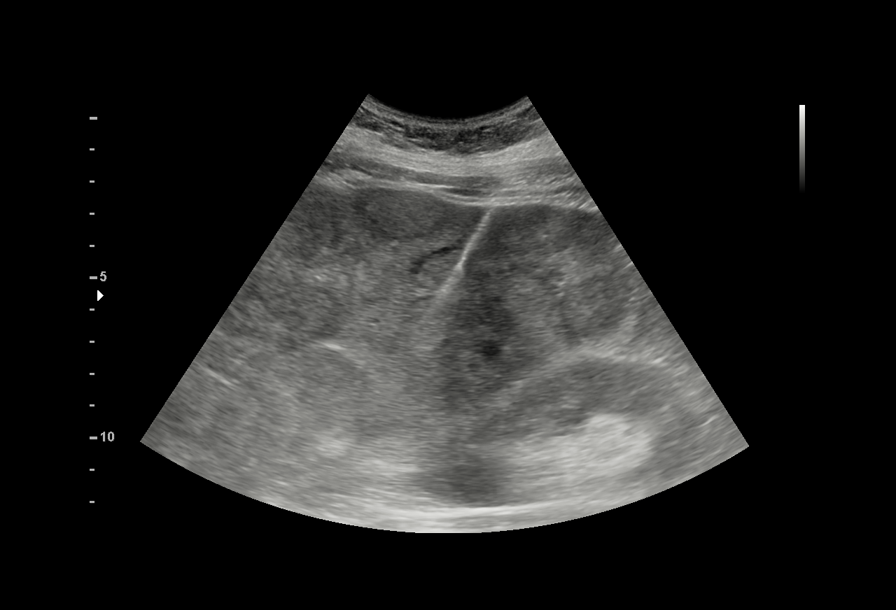
[im 7/9]
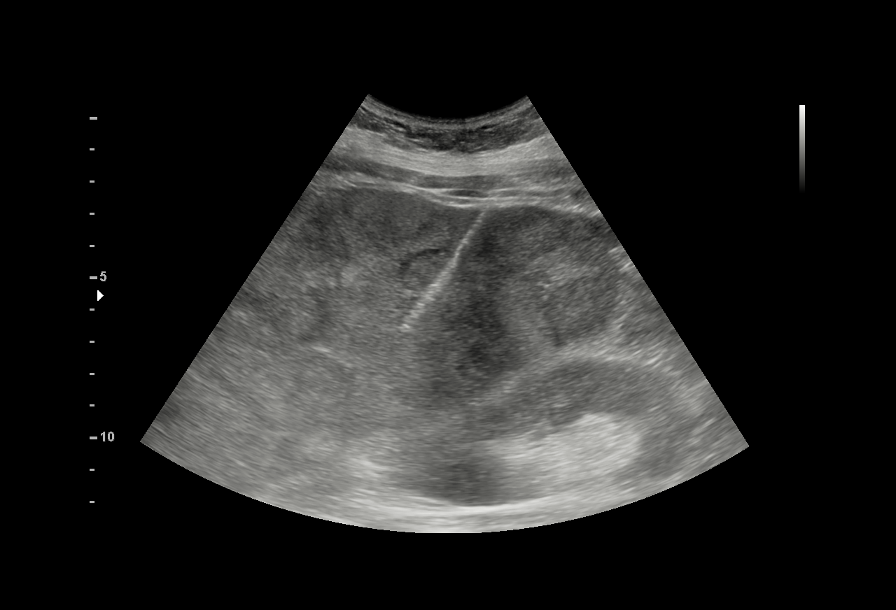
[im 8/9]
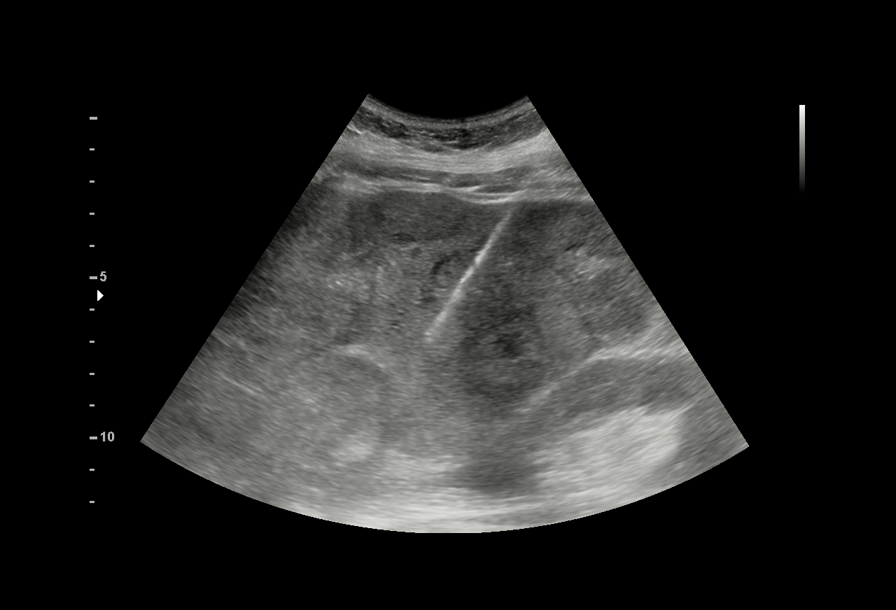
[im 9/9]
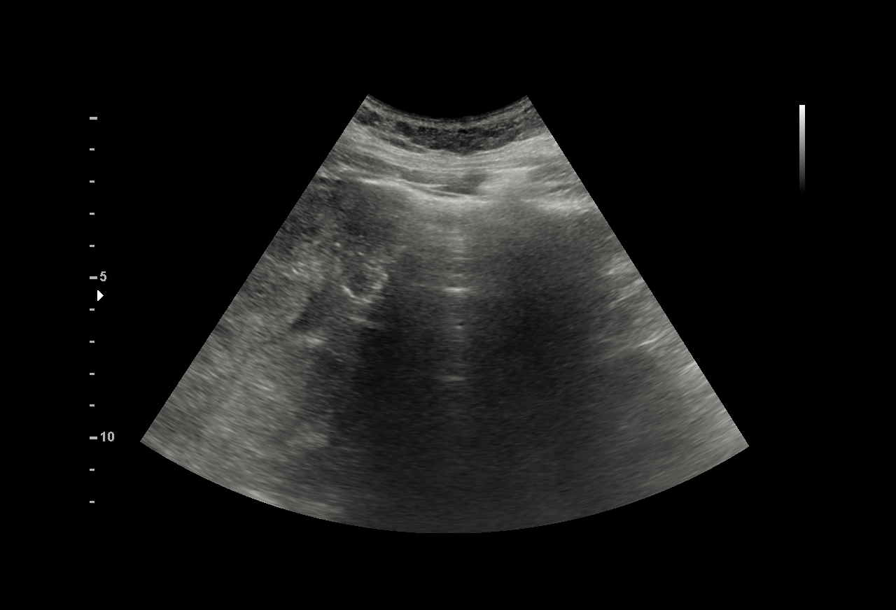

[9 of 9 positions shown; findings below may reference images not displayed]

EXAM:
ULTRASOUND GUIDED CORE BIOPSY OF LIVER

MEDICATIONS:
1.0 mg IV Versed; 25 mcg IV Fentanyl

Total Moderate Sedation Time: 10 minutes.

The patient's level of consciousness and physiologic status were
continuously monitored during the procedure by Radiology nursing.

PROCEDURE:
The procedure, risks, benefits, and alternatives were explained to
the patient. Questions regarding the procedure were encouraged and
answered. The patient understands and consents to the procedure. A
time out was performed prior to initiating the procedure.

Ultrasound was used to localize liver lesions. The abdominal wall
was prepped with chlorhexidine in a sterile fashion, and a sterile
drape was applied covering the operative field. A sterile gown and
sterile gloves were used for the procedure. Local anesthesia was
provided with 1% Lidocaine.

Under ultrasound guidance, a 17 gauge trocar needle was advanced
into the right lobe of the liver. Three separate coaxial 18 gauge
core biopsy samples were obtained at the level of a lesion. Biopsy
samples were submitted in formalin. Upon completion, a slurry of
Gel-Foam pledgets was slowly injected as the outer needle was
retracted and removed. Additional ultrasound was performed.

COMPLICATIONS:
None.
FINDINGS: The entire liver parenchyma is nearly completely replaced by large
confluent lobulated masses. Samples were obtained in the lower
aspect of the right lobe. Solid tissue was obtained.
IMPRESSION: Ultrasound-guided core biopsy performed at the level of masses in
the inferior aspect of the right lobe. The entire liver is nearly
completely replaced by large confluent lobulated masses.

## 2021-02-27 MED ORDER — LIDOCAINE HCL 1 % IJ SOLN
INTRAMUSCULAR | Status: AC
  Filled 2021-02-27: qty 20

## 2021-02-27 NOTE — Procedures (Signed)
Ultrasound-guided therapeutic paracentesis performed yielding 4.8 liters of yellow fluid. No immediate complications.EBL< 2 cc.

## 2021-03-02 ENCOUNTER — Other Ambulatory Visit: Payer: Self-pay

## 2021-03-02 ENCOUNTER — Inpatient Hospital Stay: Payer: 59

## 2021-03-02 ENCOUNTER — Ambulatory Visit (HOSPITAL_BASED_OUTPATIENT_CLINIC_OR_DEPARTMENT_OTHER)
Admission: RE | Admit: 2021-03-02 | Discharge: 2021-03-02 | Disposition: A | Discharge status: Home / Self Care | Payer: 59 | Source: Ambulatory Visit | Attending: Nurse Practitioner | Admitting: Nurse Practitioner

## 2021-03-02 ENCOUNTER — Encounter: Payer: Self-pay | Admitting: Physician Assistant

## 2021-03-02 ENCOUNTER — Encounter: Payer: Self-pay | Admitting: Oncology

## 2021-03-02 DIAGNOSIS — C19 Malignant neoplasm of rectosigmoid junction: Secondary | ICD-10-CM

## 2021-03-02 DIAGNOSIS — Z5112 Encounter for antineoplastic immunotherapy: Secondary | ICD-10-CM | POA: Diagnosis not present

## 2021-03-02 MED ORDER — IOHEXOL 350 MG/ML SOLN
60.0000 mL | Freq: Once | INTRAVENOUS | Status: AC | PRN
  Administered 2021-03-02: 60 mL via INTRAVENOUS

## 2021-03-02 MED ORDER — HEPARIN SOD (PORK) LOCK FLUSH 100 UNIT/ML IV SOLN
500.0000 [IU] | Freq: Once | INTRAVENOUS | Status: AC
  Administered 2021-03-02: 500 [IU] via INTRAVENOUS

## 2021-03-02 MED ORDER — SODIUM CHLORIDE 0.9% FLUSH
10.0000 mL | Freq: Once | INTRAVENOUS | Status: AC
  Administered 2021-03-02: 10 mL via INTRAVENOUS

## 2021-03-04 ENCOUNTER — Other Ambulatory Visit: Payer: Self-pay | Admitting: Oncology

## 2021-03-07 ENCOUNTER — Encounter: Payer: Self-pay | Admitting: Physician Assistant

## 2021-03-07 ENCOUNTER — Inpatient Hospital Stay: Payer: 59

## 2021-03-07 ENCOUNTER — Other Ambulatory Visit: Payer: Self-pay

## 2021-03-07 ENCOUNTER — Encounter: Payer: Self-pay | Admitting: Oncology

## 2021-03-07 ENCOUNTER — Ambulatory Visit (HOSPITAL_COMMUNITY)
Admission: RE | Admit: 2021-03-07 | Discharge: 2021-03-07 | Disposition: A | Discharge status: Home / Self Care | Payer: 59 | Source: Ambulatory Visit | Attending: Oncology | Admitting: Oncology

## 2021-03-07 ENCOUNTER — Inpatient Hospital Stay (HOSPITAL_BASED_OUTPATIENT_CLINIC_OR_DEPARTMENT_OTHER): Payer: 59 | Admitting: Oncology

## 2021-03-07 ENCOUNTER — Other Ambulatory Visit (HOSPITAL_COMMUNITY): Payer: Self-pay

## 2021-03-07 ENCOUNTER — Telehealth: Payer: Self-pay

## 2021-03-07 ENCOUNTER — Other Ambulatory Visit: Payer: Self-pay | Admitting: Oncology

## 2021-03-07 VITALS — BP 131/93

## 2021-03-07 VITALS — BP 135/96 | HR 105 | Temp 98.7°F | Resp 18 | Ht 62.0 in | Wt 170.4 lb

## 2021-03-07 DIAGNOSIS — C19 Malignant neoplasm of rectosigmoid junction: Secondary | ICD-10-CM

## 2021-03-07 DIAGNOSIS — R188 Other ascites: Secondary | ICD-10-CM | POA: Diagnosis not present

## 2021-03-07 HISTORY — PX: IR PARACENTESIS: IMG2679

## 2021-03-07 LAB — CBC WITH DIFFERENTIAL (CANCER CENTER ONLY)
Abs Immature Granulocytes: 0.01 10*3/uL (ref 0.00–0.07)
Basophils Absolute: 0 10*3/uL (ref 0.0–0.1)
Basophils Relative: 0 %
Eosinophils Absolute: 0.1 10*3/uL (ref 0.0–0.5)
Eosinophils Relative: 2 %
HCT: 35 % — ABNORMAL LOW (ref 36.0–46.0)
Hemoglobin: 11.2 g/dL — ABNORMAL LOW (ref 12.0–15.0)
Immature Granulocytes: 0 %
Lymphocytes Relative: 37 %
Lymphs Abs: 1.8 10*3/uL (ref 0.7–4.0)
MCH: 28.7 pg (ref 26.0–34.0)
MCHC: 32 g/dL (ref 30.0–36.0)
MCV: 89.7 fL (ref 80.0–100.0)
Monocytes Absolute: 1.2 10*3/uL — ABNORMAL HIGH (ref 0.1–1.0)
Monocytes Relative: 25 %
Neutro Abs: 1.7 10*3/uL (ref 1.7–7.7)
Neutrophils Relative %: 36 %
Platelet Count: 333 10*3/uL (ref 150–400)
RBC: 3.9 MIL/uL (ref 3.87–5.11)
RDW: 22 % — ABNORMAL HIGH (ref 11.5–15.5)
WBC Count: 4.8 10*3/uL (ref 4.0–10.5)
nRBC: 0 % (ref 0.0–0.2)

## 2021-03-07 LAB — CMP (CANCER CENTER ONLY)
ALT: 12 U/L (ref 0–44)
AST: 42 U/L — ABNORMAL HIGH (ref 15–41)
Albumin: 2.2 g/dL — ABNORMAL LOW (ref 3.5–5.0)
Alkaline Phosphatase: 720 U/L — ABNORMAL HIGH (ref 38–126)
Anion gap: 10 (ref 5–15)
BUN: 19 mg/dL (ref 8–23)
CO2: 19 mmol/L — ABNORMAL LOW (ref 22–32)
Calcium: 8.1 mg/dL — ABNORMAL LOW (ref 8.9–10.3)
Chloride: 101 mmol/L (ref 98–111)
Creatinine: 0.68 mg/dL (ref 0.44–1.00)
GFR, Estimated: >60 mL/min (ref >60)
Glucose, Bld: 109 mg/dL — ABNORMAL HIGH (ref 70–99)
Potassium: 5.3 mmol/L — ABNORMAL HIGH (ref 3.5–5.1)
Sodium: 130 mmol/L — ABNORMAL LOW (ref 135–145)
Total Bilirubin: 2.2 mg/dL — ABNORMAL HIGH (ref 0.3–1.2)
Total Protein: 6.3 g/dL — ABNORMAL LOW (ref 6.5–8.1)

## 2021-03-07 LAB — CEA (ACCESS): CEA (CHCC): 46189.7 ng/mL — ABNORMAL HIGH (ref 0.00–5.00)

## 2021-03-07 MED ORDER — SODIUM CHLORIDE 0.9 % IV SOLN: Freq: Once | INTRAVENOUS | Status: AC

## 2021-03-07 MED ORDER — SODIUM CHLORIDE 0.9 % IV SOLN
5.0000 mg/kg | Freq: Once | INTRAVENOUS | Status: AC
  Administered 2021-03-07: 350 mg via INTRAVENOUS
  Filled 2021-03-07: qty 14

## 2021-03-07 MED ORDER — NYSTATIN 100000 UNIT/ML MT SUSP
5.0000 mL | Freq: Four times a day (QID) | OROMUCOSAL | 0 refills | Status: AC | PRN
  Filled 2021-03-07: qty 240, 12d supply, fill #0

## 2021-03-07 MED ORDER — SODIUM CHLORIDE 0.9% FLUSH
10.0000 mL | INTRAVENOUS | Status: DC | PRN
  Administered 2021-03-07: 10 mL

## 2021-03-07 MED ORDER — HEPARIN SOD (PORK) LOCK FLUSH 100 UNIT/ML IV SOLN
500.0000 [IU] | Freq: Once | INTRAVENOUS | Status: AC | PRN
Start: 2021-03-07 — End: 2021-03-07
  Administered 2021-03-07: 500 [IU]

## 2021-03-07 MED ORDER — LIDOCAINE HCL 1 % IJ SOLN
INTRAMUSCULAR | Status: DC | PRN
  Administered 2021-03-07: 10 mL

## 2021-03-07 MED ORDER — LIDOCAINE HCL 1 % IJ SOLN
INTRAMUSCULAR | Status: AC
  Filled 2021-03-07: qty 20

## 2021-03-07 NOTE — Telephone Encounter (Signed)
Pt scheduled for paracentesis today at [facility] @ 1245. Pt aware

## 2021-03-07 NOTE — Progress Notes (Signed)
[facility] OFFICE PROGRESS NOTE   Diagnosis: Colon cancer  INTERVAL HISTORY:   Rachel Frank completed another cycle of Avastin on 02/20/2021.  No bleeding except when she blows her nose.  She last had a paracentesis for 4.8 L of fluid on 02/27/2021.  She reports feeling better after the paracentesis.  She has increased abdominal pain prior to needing a paracentesis.  She takes oxycodone as needed. Rachel Frank reports a good appetite.  She is ambulatory in the home.  She reports mouth soreness without discrete ulcers.  Objective:  Vital signs in last 24 hours:  Blood pressure (!) 135/96, pulse (!) 105, temperature 98.7 F (37.1 C), temperature source Oral, resp. rate 18, height $RemoveBe'5\' 2"'pxGMNegLs$  (1.575 m), weight 170 lb 6.4 oz (77.3 kg), SpO2 100 %.    HEENT: No thrush or ulcers Resp: Lungs clear bilaterally Cardio: Regular rate and rhythm GI: Distended with ascites, the liver is nonpalpable Vascular: Pitting edema throughout the legs bilaterally    Portacath/PICC-without erythema  Lab Results:  Lab Results  Component Value Date   WBC 4.8 03/07/2021   HGB 11.2 (L) 03/07/2021   HCT 35.0 (L) 03/07/2021   MCV 89.7 03/07/2021   PLT 333 03/07/2021   NEUTROABS 1.7 03/07/2021    CMP  Lab Results  Component Value Date   NA 131 (L) 02/20/2021   K 5.0 02/20/2021   CL 98 02/20/2021   CO2 22 02/20/2021   GLUCOSE 95 02/20/2021   BUN 14 02/20/2021   CREATININE 0.58 02/20/2021   CALCIUM 8.4 (L) 02/20/2021   PROT 6.5 02/20/2021   ALBUMIN 2.4 (L) 02/20/2021   AST 58 (H) 02/20/2021   ALT 20 02/20/2021   ALKPHOS 944 (H) 02/20/2021   BILITOT 2.0 (H) 02/20/2021   GFRNONAA >60 02/20/2021   GFRAA >60 03/30/2020    Lab Results  Component Value Date   CEA1 65,035.46 (H) 12/12/2020   CEA 39,160.00 (H) 02/20/2021    Medications: I have reviewed the patient's current medications.   Assessment/Plan:  Stage IV adenocarcinoma of the cecum. She presented with a large cecal mass,  extensive hepatic metastatic disease; mild thoracic and abdominal adenopathy. She also has several small nonspecific pulmonary nodules. She was diagnosed in March 2021. Colonoscopy 09/16/2019-fungating mass at the cecum, sessile polyps in the this sigmoid, transverse colon, and hepatic flexure, adenocarcinoma involving cecum biopsy, tubular adenoma and sessile serrated polyps CTs chest, abdomen, and pelvis 10/08/2019-extensive liver metastases, thoracic/abdominal adenopathy, cecal mass, dilated and thin thickened appendix, nonspecific pulmonary nodules Ultrasound-guided liver biopsy 10/13/2019-adenocarcinoma consistent with a metastatic colorectal primary, MSS, tumor mutation burden 3, KRAS Q61, Cycle 1 FOLFOX 10/14/2019 Cycle 2 FOLFOX 10/27/2019 Cycle 3 FOLFOX 11/11/2019, Emend and Decadron added, 5-FU dose escalated Cycle 4 FOLFOX 11/25/2019, Udenyca added Cycle 5 FOLFOX 12/08/2019 Cycle 6 FOLFOX 12/23/2019 CT 01/04/2020-decreased size of multiple hepatic metastases, slight decrease in bulk of cecal mass, stable ileocecal adenopathy Cycle 7 FOLFOX 01/06/2020 Cycle 8 FOLFOX 01/31/2020 Cycle 9 FOLFOX 02/17/2020 (oxaliplatin dose reduced due to borderline ANC, persistent cold sensitivity) Cycle 10 FOLFOX 03/02/2020 Cycle 11 FOLFOX 03/16/2020 Cycle 12 FOLFOX 03/30/2020 (oxaliplatin held secondary to neuropathy) CT abdomen/pelvis 04/19/2020-no significant change in the abnormal soft tissue lesion in the region of the cecal tip involving the ileocecal valve and appendiceal orifice; slight interval progression of numerous hepatic metastases; similar appearance of small lymph nodes in the ileocolic mesentery with adjacent irregular focus of soft tissue also stable Cycle 1 FOLFIRI/Avastin 04/25/2020 Cycle 2 FOLFIRI/Avastin 05/11/2020 Cycle 3  FOLFIRI 05/25/2020 (Avastin held due to proteinuria) Cycle 4 FOLFIRI/Avastin 06/07/2020 Cycle 5 FOLFIRI/Avastin 06/22/2020 CTs 07/05/2020-mild improvement in multifocal liver  metastases.  Mass at the base of the cecum is stable.  Persistent thickening of the appendix. Cycle 6 FOLFIRI/Avastin 07/05/2020 Cycle 7 FOLFIRI/Avastin 07/20/2020 Cycle 8 FOLFIRI/Avastin 08/03/2020 Cycle 9 FOLFIRI 09/05/2020, Avastin held secondary to proteinuria CT chest 09/05/2020-negative for pulmonary embolism, no acute disease, unchanged small pulmonary nodules Cycle 10 FOLFIRI 09/20/2020, Avastin held secondary to proteinuria Cycle 11 FOLFIRI 10/04/2020, Avastin held secondary to proteinuria CTs 10/17/2020-increase in size of liver metastases, stable cecal mass, slight decrease in size of right lower quadrant mesenteric lymph node Cycle 12 FOLFIRI 10/18/2020, Avastin held secondary to proteinuria Cycle 1 FOLFOX/Avastin 11/13/2020 Cycle 2 FOLFOX/Avastin 11/27/2020 Cycle 3 FOLFOX/Avastin 12/12/2020 Cycle 4 FOLFOX/Avastin 12/26/2020 CT 01/04/2021-bulky confluent liver metastases, some are larger and others are stable, increased ascites with suggestion of peritoneal thickening and nodularity, unchanged cecal mass 01/09/2021-every 2-week Avastin continued Cycle 1 Lonsurf 01/15/2021 Therapeutic paracentesis 01/18/2021, 01/24/2021-cytology negative Cycle 2 Lonsurf 02/12/2021 CT abdomen/pelvis 03/02/2021-diffuse hepatic metastases, not significantly changed, large volume ascites, peritoneal thickening and enhancement suspicious for carcinomatosis Cycle 3 Lonsurf 03/12/2021 2) Iron deficiency anemia secondary to #1.  3.  Pain secondary to #1-improved 4.  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  Diarrhea 01/20/2020.  C. difficile negative 7.  COVID-19 infection 08/13/2020, antibody infusion therapy on 08/19/2020 8.  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  Inflamed left labial cyst  09/29/2020-doxycycline       Disposition: Rachel Frank appears unchanged.  The restaging CT reveals no change in the extensive hepatic tumor burden.  I reviewed the CT images with her.  We discussed treatment options.  Her clinical status is stable.  The CEA has not changed significantly over the past few months.  We decided to continue Lonsurf/Avastin.  She will begin another cycle of Lonsurf on 03/12/2021.  Rachel Frank will be referred for a therapeutic paracentesis today.  She will complete another treatment with Avastin today.  She will return for an office visit and Avastin in 2 weeks.  She will begin a trial of Magic mouthwash.  She will discontinue potassium.  Betsy Coder, MD  03/07/2021  9:53 AM

## 2021-03-07 NOTE — Progress Notes (Signed)
Per Dr Benay Spice ok to treat with pulse 105 and urine protein of 100 from 02/20/2021.

## 2021-03-07 NOTE — Procedures (Signed)
PROCEDURE SUMMARY:  Successful image-guided paracentesis from the right lower abdomen.  Yielded 4.2 liters of clear yellow fluid.  No immediate complications.  EBL = trace. Patient tolerated well.   Specimen was sent for labs.  Please see imaging section of Epic for full dictation.   Armando Gang Pryor Guettler PA-C 03/07/2021 2:14 PM

## 2021-03-07 NOTE — Telephone Encounter (Signed)
Per Dr Benay Spice ok to treat with urine protein results from 02/20/21 and HR of 105.

## 2021-03-07 NOTE — Patient Instructions (Signed)
[facility]  Discharge Instructions: Thank you for choosing [facility] to provide your oncology and hematology care.   If you have a lab appointment with the [facility], please go directly to the [facility] and check in at the registration area.   Wear comfortable clothing and clothing appropriate for easy access to any Portacath or PICC line.   We strive to give you quality time with your provider. You may need to reschedule your appointment if you arrive late (15 or more minutes).  Arriving late affects you and other patients whose appointments are after yours.  Also, if you miss three or more appointments without notifying the office, you may be dismissed from the clinic at the provider's discretion.      For prescription refill requests, have your pharmacy contact our office and allow 72 hours for refills to be completed.    Today you received the following chemotherapy and/or immunotherapy agents avastin     To help prevent nausea and vomiting after your treatment, we encourage you to take your nausea medication as directed.  BELOW ARE SYMPTOMS THAT SHOULD BE REPORTED IMMEDIATELY: *FEVER GREATER THAN 100.4 F (38 C) OR HIGHER *CHILLS OR SWEATING *NAUSEA AND VOMITING THAT IS NOT CONTROLLED WITH YOUR NAUSEA MEDICATION *UNUSUAL SHORTNESS OF BREATH *UNUSUAL BRUISING OR BLEEDING *URINARY PROBLEMS (pain or burning when urinating, or frequent urination) *BOWEL PROBLEMS (unusual diarrhea, constipation, pain near the anus) TENDERNESS IN MOUTH AND THROAT WITH OR WITHOUT PRESENCE OF ULCERS (sore throat, sores in mouth, or a toothache) UNUSUAL RASH, SWELLING OR PAIN  UNUSUAL VAGINAL DISCHARGE OR ITCHING   Items with * indicate a potential emergency and should be followed up as soon as possible or go to the Emergency Department if any problems should occur.  Please show the CHEMOTHERAPY ALERT CARD or IMMUNOTHERAPY ALERT CARD at check-in to the  Emergency Department and triage nurse.  Should you have questions after your visit or need to cancel or reschedule your appointment, please contact [facility]  Dept: [phone]  and follow the prompts.  Office hours are 8:00 a.m. to 4:30 p.m. Monday - Friday. Please note that voicemails left after 4:00 p.m. may not be returned until the following business day.  We are closed weekends and major holidays. You have access to a nurse at all times for urgent questions. Please call the main number to the clinic Dept: [phone] and follow the prompts.   For any non-urgent questions, you may also contact your provider using MyChart. We now offer e-Visits for anyone 21 and older to request care online for non-urgent symptoms. For details visit [email or web address].   Also download the MyChart app! Go to the app store, search "MyChart", open the app, select [facility], and log in with your MyChart username and password.  Due to Covid, a mask is required upon entering the hospital/clinic. If you do not have a mask, one will be given to you upon arrival. For doctor visits, patients may have 1 support person aged 19 or older with them. For treatment visits, patients cannot have anyone with them due to current Covid guidelines and our immunocompromised population.   Bevacizumab injection What is this medication? BEVACIZUMAB (be va SIZ yoo mab) is a monoclonal antibody. It is used to treatmany types of cancer. This medicine may be used for other purposes; ask your health care provider orpharmacist if you have questions. COMMON BRAND NAME(S): Avastin, MVASI, Zirabev What should  I tell my care team before I take this medication? They need to know if you have any of these conditions: diabetes heart disease high blood pressure history of coughing up blood prior anthracycline chemotherapy (e.g., doxorubicin, daunorubicin, epirubicin) recent or ongoing radiation therapy recent  or planning to have surgery stroke an unusual or allergic reaction to bevacizumab, hamster proteins, mouse proteins, other medicines, foods, dyes, or preservatives pregnant or trying to get pregnant breast-feeding How should I use this medication? This medicine is for infusion into a vein. It is given by a health careprofessional in a hospital or clinic setting. Talk to your pediatrician regarding the use of this medicine in children.Special care may be needed. Overdosage: If you think you have taken too much of this medicine contact apoison control center or emergency room at once. NOTE: This medicine is only for you. Do not share this medicine with others. What if I miss a dose? It is important not to miss your dose. Call your doctor or health careprofessional if you are unable to keep an appointment. What may interact with this medication? Interactions are not expected. This list may not describe all possible interactions. Give your health care provider a list of all the medicines, herbs, non-prescription drugs, or dietary supplements you use. Also tell them if you smoke, drink alcohol, or use illegaldrugs. Some items may interact with your medicine. What should I watch for while using this medication? Your condition will be monitored carefully while you are receiving this medicine. You will need important blood work and urine testing done while youare taking this medicine. This medicine may increase your risk to bruise or bleed. Call your doctor orhealth care professional if you notice any unusual bleeding. Before having surgery, talk to your health care provider to make sure it is ok. This drug can increase the risk of poor healing of your surgical site or wound. You will need to stop this drug for 28 days before surgery. After surgery, wait at least 28 days before restarting this drug. Make sure the surgical site or wound is healed enough before restarting this drug. Talk to your health  careprovider if questions. Do not become pregnant while taking this medicine or for 6 months after stopping it. Women should inform their doctor if they wish to become pregnant or think they might be pregnant. There is a potential for serious side effects to an unborn child. Talk to your health care professional or pharmacist for more information. Do not breast-feed an infant while taking this medicine andfor 6 months after the last dose. This medicine has caused ovarian failure in some women. This medicine may interfere with the ability to have a child. You should talk to your doctor orhealth care professional if you are concerned about your fertility. What side effects may I notice from receiving this medication? Side effects that you should report to your doctor or health care professionalas soon as possible: allergic reactions like skin rash, itching or hives, swelling of the face, lips, or tongue chest pain or chest tightness chills coughing up blood high fever seizures severe constipation signs and symptoms of bleeding such as bloody or black, tarry stools; red or dark-brown urine; spitting up blood or brown material that looks like coffee grounds; red spots on the skin; unusual bruising or bleeding from the eye, gums, or nose signs and symptoms of a blood clot such as breathing problems; chest pain; severe, sudden headache; pain, swelling, warmth in the leg signs and symptoms of  a stroke like changes in vision; confusion; trouble speaking or understanding; severe headaches; sudden numbness or weakness of the face, arm or leg; trouble walking; dizziness; loss of balance or coordination stomach pain sweating swelling of legs or ankles vomiting weight gain Side effects that usually do not require medical attention (report to yourdoctor or health care professional if they continue or are bothersome): back pain changes in taste decreased appetite dry skin nausea tiredness This list may  not describe all possible side effects. Call your doctor for medical advice about side effects. You may report side effects to FDA at1-800-FDA-1088. Where should I keep my medication? This drug is given in a hospital or clinic and will not be stored at home. NOTE: This sheet is a summary. It may not cover all possible information. If you have questions about this medicine, talk to your doctor, pharmacist, orhealth care provider.  2022 Elsevier/Gold Standard (2019-04-28 10:50:46)

## 2021-03-08 LAB — CYTOLOGY - NON PAP

## 2021-03-13 ENCOUNTER — Other Ambulatory Visit: Payer: Self-pay

## 2021-03-13 ENCOUNTER — Ambulatory Visit (HOSPITAL_COMMUNITY)
Admission: RE | Admit: 2021-03-13 | Discharge: 2021-03-13 | Disposition: A | Discharge status: Home / Self Care | Payer: 59 | Source: Ambulatory Visit | Attending: Oncology | Admitting: Oncology

## 2021-03-13 DIAGNOSIS — C19 Malignant neoplasm of rectosigmoid junction: Secondary | ICD-10-CM | POA: Insufficient documentation

## 2021-03-13 MED ORDER — LIDOCAINE HCL 1 % IJ SOLN
INTRAMUSCULAR | Status: AC
  Filled 2021-03-13: qty 20

## 2021-03-13 NOTE — Procedures (Signed)
PROCEDURE SUMMARY:  Successful image-guided paracentesis from the right lower abdomen.  Yielded 5 liters of clear yellow fluid.  No immediate complications.  EBL = trace. Patient tolerated well.   Specimen was sent for labs.  Please see imaging section of Epic for full dictation.   Armando Gang Romelle Muldoon PA-C 03/13/2021 11:53 AM

## 2021-03-15 LAB — CYTOLOGY - NON PAP

## 2021-03-19 ENCOUNTER — Other Ambulatory Visit: Payer: Self-pay | Admitting: Oncology

## 2021-03-20 ENCOUNTER — Other Ambulatory Visit: Payer: Self-pay

## 2021-03-20 DIAGNOSIS — C19 Malignant neoplasm of rectosigmoid junction: Secondary | ICD-10-CM

## 2021-03-21 ENCOUNTER — Inpatient Hospital Stay (HOSPITAL_BASED_OUTPATIENT_CLINIC_OR_DEPARTMENT_OTHER): Payer: 59 | Admitting: Nurse Practitioner

## 2021-03-21 ENCOUNTER — Inpatient Hospital Stay: Payer: 59 | Admitting: Nutrition

## 2021-03-21 ENCOUNTER — Inpatient Hospital Stay: Payer: 59

## 2021-03-21 ENCOUNTER — Other Ambulatory Visit: Payer: Self-pay

## 2021-03-21 ENCOUNTER — Inpatient Hospital Stay: Payer: 59 | Attending: Physician Assistant

## 2021-03-21 ENCOUNTER — Ambulatory Visit (HOSPITAL_COMMUNITY)
Admission: RE | Admit: 2021-03-21 | Discharge: 2021-03-21 | Disposition: A | Discharge status: Home / Self Care | Payer: 59 | Source: Ambulatory Visit | Attending: Oncology | Admitting: Oncology

## 2021-03-21 VITALS — BP 149/78

## 2021-03-21 VITALS — BP 118/83 | HR 76 | Temp 97.5°F | Resp 18 | Ht 62.0 in | Wt 162.8 lb

## 2021-03-21 DIAGNOSIS — C19 Malignant neoplasm of rectosigmoid junction: Secondary | ICD-10-CM

## 2021-03-21 LAB — CMP (CANCER CENTER ONLY)
ALT: 15 U/L (ref 0–44)
AST: 57 U/L — ABNORMAL HIGH (ref 15–41)
Albumin: 2.1 g/dL — ABNORMAL LOW (ref 3.5–5.0)
Alkaline Phosphatase: 873 U/L — ABNORMAL HIGH (ref 38–126)
Anion gap: 11 (ref 5–15)
BUN: 35 mg/dL — ABNORMAL HIGH (ref 8–23)
CO2: 20 mmol/L — ABNORMAL LOW (ref 22–32)
Calcium: 8 mg/dL — ABNORMAL LOW (ref 8.9–10.3)
Chloride: 96 mmol/L — ABNORMAL LOW (ref 98–111)
Creatinine: 1.01 mg/dL — ABNORMAL HIGH (ref 0.44–1.00)
GFR, Estimated: >60 mL/min (ref >60)
Glucose, Bld: 116 mg/dL — ABNORMAL HIGH (ref 70–99)
Potassium: 4.2 mmol/L (ref 3.5–5.1)
Sodium: 127 mmol/L — ABNORMAL LOW (ref 135–145)
Total Bilirubin: 2 mg/dL — ABNORMAL HIGH (ref 0.3–1.2)
Total Protein: 6.2 g/dL — ABNORMAL LOW (ref 6.5–8.1)

## 2021-03-21 LAB — CBC WITH DIFFERENTIAL (CANCER CENTER ONLY)
Abs Immature Granulocytes: 0.08 10*3/uL — ABNORMAL HIGH (ref 0.00–0.07)
Basophils Absolute: 0.1 10*3/uL (ref 0.0–0.1)
Basophils Relative: 1 %
Eosinophils Absolute: 0 10*3/uL (ref 0.0–0.5)
Eosinophils Relative: 0 %
HCT: 32.7 % — ABNORMAL LOW (ref 36.0–46.0)
Hemoglobin: 10.6 g/dL — ABNORMAL LOW (ref 12.0–15.0)
Immature Granulocytes: 1 %
Lymphocytes Relative: 13 %
Lymphs Abs: 1.2 10*3/uL (ref 0.7–4.0)
MCH: 28.1 pg (ref 26.0–34.0)
MCHC: 32.4 g/dL (ref 30.0–36.0)
MCV: 86.7 fL (ref 80.0–100.0)
Monocytes Absolute: 0.5 10*3/uL (ref 0.1–1.0)
Monocytes Relative: 5 %
Neutro Abs: 7.8 10*3/uL — ABNORMAL HIGH (ref 1.7–7.7)
Neutrophils Relative %: 80 %
Platelet Count: 260 10*3/uL (ref 150–400)
RBC: 3.77 MIL/uL — ABNORMAL LOW (ref 3.87–5.11)
RDW: 20.4 % — ABNORMAL HIGH (ref 11.5–15.5)
WBC Count: 9.7 10*3/uL (ref 4.0–10.5)
nRBC: 0.3 % — ABNORMAL HIGH (ref 0.0–0.2)

## 2021-03-21 LAB — CEA (ACCESS): CEA (CHCC): 45662 ng/mL — ABNORMAL HIGH (ref 0.00–5.00)

## 2021-03-21 LAB — TOTAL PROTEIN, URINE DIPSTICK: Protein, ur: NEGATIVE mg/dL

## 2021-03-21 MED ORDER — SODIUM CHLORIDE 0.9 % IV SOLN
5.0000 mg/kg | Freq: Once | INTRAVENOUS | Status: AC
  Administered 2021-03-21: 350 mg via INTRAVENOUS
  Filled 2021-03-21: qty 14

## 2021-03-21 MED ORDER — SODIUM CHLORIDE 0.9 % IV SOLN: Freq: Once | INTRAVENOUS | Status: AC

## 2021-03-21 MED ORDER — SODIUM CHLORIDE 0.9% FLUSH
10.0000 mL | INTRAVENOUS | Status: DC | PRN
  Administered 2021-03-21: 10 mL

## 2021-03-21 MED ORDER — LIDOCAINE-PRILOCAINE 2.5-2.5 % EX CREA: 1.0000 "application " | TOPICAL_CREAM | CUTANEOUS | 2 refills | Status: AC | PRN

## 2021-03-21 MED ORDER — SPIRONOLACTONE 25 MG PO TABS: 25.0000 mg | ORAL_TABLET | Freq: Two times a day (BID) | ORAL | 1 refills | Status: DC

## 2021-03-21 MED ORDER — HEPARIN SOD (PORK) LOCK FLUSH 100 UNIT/ML IV SOLN
500.0000 [IU] | Freq: Once | INTRAVENOUS | Status: AC | PRN
  Administered 2021-03-21: 500 [IU]

## 2021-03-21 MED ORDER — LIDOCAINE HCL 1 % IJ SOLN
INTRAMUSCULAR | Status: AC
  Filled 2021-03-21: qty 20

## 2021-03-21 NOTE — Progress Notes (Signed)
Patient presents for treatment. RN assessment completed along with the following:  Labs reviewed - WNL Vitals reviewed including weight - WNL Oncology Treatment Attestation completed - yes Consent completed and reflects current therapy/intent - yes MD/APP progress note reviewed - yes Treatment plan/orders reviewed - yes  Patient to proceed with treatment.

## 2021-03-21 NOTE — Patient Instructions (Signed)
Implanted Port Home Guide An implanted port is a device that is placed under the skin. It is usually placed in the chest. The device can be used to give IV medicine, to take blood, or for dialysis. You may have an implanted port if: You need IV medicine that would be irritating to the small veins in your hands or arms. You need IV medicines, such as antibiotics, for a long period of time. You need IV nutrition for a long period of time. You need dialysis. When you have a port, your health care provider can choose to use the port instead of veins in your arms for these procedures. You may have fewer limitations when using a port than you would if you used other types of long-term IVs, and you will likely be able to return to normal activities after your incision heals. An implanted port has two main parts: Reservoir. The reservoir is the part where a needle is inserted to give medicines or draw blood. The reservoir is round. After it is placed, it appears as a small, raised area under your skin. Catheter. The catheter is a thin, flexible tube that connects the reservoir to a vein. Medicine that is inserted into the reservoir goes into the catheter and then into the vein. How is my port accessed? To access your port: A numbing cream may be placed on the skin over the port site. Your health care provider will put on a mask and sterile gloves. The skin over your port will be cleaned carefully with a germ-killing soap and allowed to dry. Your health care provider will gently pinch the port and insert a needle into it. Your health care provider will check for a blood return to make sure the port is in the vein and is not clogged. If your port needs to remain accessed to get medicine continuously (constant infusion), your health care provider will place a clear bandage (dressing) over the needle site. The dressing and needle will need to be changed every week, or as told by your health care provider. What  is flushing? Flushing helps keep the port from getting clogged. Follow instructions from your health care provider about how and when to flush the port. Ports are usually flushed with saline solution or a medicine called heparin. The need for flushing will depend on how the port is used: If the port is only used from time to time to give medicines or draw blood, the port may need to be flushed: Before and after medicines have been given. Before and after blood has been drawn. As part of routine maintenance. Flushing may be recommended every 4-6 weeks. If a constant infusion is running, the port may not need to be flushed. Throw away any syringes in a disposal container that is meant for sharp items (sharps container). You can buy a sharps container from a pharmacy, or you can make one by using an empty hard plastic bottle with a cover. How long will my port stay implanted? The port can stay in for as long as your health care provider thinks it is needed. When it is time for the port to come out, a surgery will be done to remove it. The surgery will be similar to the procedure that was done to put the port in. Follow these instructions at home:  Flush your port as told by your health care provider. If you need an infusion over several days, follow instructions from your health care provider about how   to take care of your port site. Make sure you: Wash your hands with soap and water before you change your dressing. If soap and water are not available, use alcohol-based hand sanitizer. Change your dressing as told by your health care provider. Place any used dressings or infusion bags into a plastic bag. Throw that bag in the trash. Keep the dressing that covers the needle clean and dry. Do not get it wet. Do not use scissors or sharp objects near the tube. Keep the tube clamped, unless it is being used. Check your port site every day for signs of infection. Check for: Redness, swelling, or  pain. Fluid or blood. Pus or a bad smell. Protect the skin around the port site. Avoid wearing bra straps that rub or irritate the site. Protect the skin around your port from seat belts. Place a soft pad over your chest if needed. Bathe or shower as told by your health care provider. The site may get wet as long as you are not actively receiving an infusion. Return to your normal activities as told by your health care provider. Ask your health care provider what activities are safe for you. Carry a medical alert card or wear a medical alert bracelet at all times. This will let health care providers know that you have an implanted port in case of an emergency. Get help right away if: You have redness, swelling, or pain at the port site. You have fluid or blood coming from your port site. You have pus or a bad smell coming from the port site. You have a fever. Summary Implanted ports are usually placed in the chest for long-term IV access. Follow instructions from your health care provider about flushing the port and changing bandages (dressings). Take care of the area around your port by avoiding clothing that puts pressure on the area, and by watching for signs of infection. Protect the skin around your port from seat belts. Place a soft pad over your chest if needed. Get help right away if you have a fever or you have redness, swelling, pain, drainage, or a bad smell at the port site. This information is not intended to replace advice given to you by your health care provider. Make sure you discuss any questions you have with your health care provider. Document Revised: 09/20/2020 Document Reviewed: 11/15/2019 Elsevier Patient Education  2022 Elsevier Inc.  

## 2021-03-21 NOTE — Procedures (Signed)
PROCEDURE SUMMARY:  Successful US guided paracentesis from right abdomen.  Yielded 3L of amber-colored fluid.  No immediate complications.  Pt tolerated well.   Specimen sent for labs.  EBL < 2 mL  Theresa Duty, NP 03/21/2021 4:20 PM

## 2021-03-21 NOTE — Progress Notes (Signed)
Patient familiar from previous nutrition visits. Patient diagnosed with stage IV Colon Cancer in March 2021.  Labs reviewed: Na 127, Glucose 116  She began another cycle of Lonsurf 03/12/2021.  She completed another cycle of Avastin 03/07/2021.   She had nausea for few days.   Appetite varies.   Some diarrhea, controlled with antidiarrheal medication. Weight documented as 162 pounds. Patient noted to have abdominal ascites. Patient drinks Premier Protein shakes because they are less expensive than ensure. She has some taste alterations.  Nutrition Diagnosis: Food and Nutrition Related Knowledge Deficit related to metastatic colon cancer as evidenced by no prior need for nutrition related information.  Intervention: Educated to eat small meals and snacks with adequate calories and increased protein. Change oral nutrition supplements to ensure plus or equivalent BID. Provided one complimentary case of Ensure plus. Educated on strategies to improve taste alterations. Provided nutrition fact sheets. Questions answered. Teach back method used.  Monitoring, Evaluation, Goals: Patient will tolerate increased calories and protein to preserve lean body mass.  Next Visit: To be scheduled as needed. Patient given RD contact information.

## 2021-03-21 NOTE — Progress Notes (Signed)
[facility] OFFICE PROGRESS NOTE   Diagnosis: Colon cancer  INTERVAL HISTORY:   Rachel Frank returns as scheduled.  She began another cycle of Lonsurf 03/12/2021.  She completed another cycle of Avastin 03/07/2021.  She had nausea for few days.  Appetite varies.  Some diarrhea, controlled with antidiarrheal medication.  No rash.  She reports a recent cough.  No fever.  No shortness of breath.  Cough is better now.  Objective:  Vital signs in last 24 hours:  Blood pressure 118/83, pulse 76, temperature (!) 97.5 F (36.4 C), temperature source Oral, resp. rate 18, height $RemoveBe'5\' 2"'tchEDCYIn$  (1.575 m), weight 162 lb 12.8 oz (73.8 kg), SpO2 100 %.    HEENT: No thrush or ulcers. Resp: Lungs clear bilaterally. Cardio: Regular rate and rhythm. GI: Abdomen distended with ascites. Vascular: Pitting edema throughout the legs bilaterally. Port-A-Cath without erythema.  Lab Results:  Lab Results  Component Value Date   WBC 9.7 03/21/2021   HGB 10.6 (L) 03/21/2021   HCT 32.7 (L) 03/21/2021   MCV 86.7 03/21/2021   PLT 260 03/21/2021   NEUTROABS 7.8 (H) 03/21/2021    Imaging:  No results found.  Medications: I have reviewed the patient's current medications.  Assessment/Plan: Stage IV adenocarcinoma of the cecum. She presented with a large cecal mass, extensive hepatic metastatic disease; mild thoracic and abdominal adenopathy. She also has several small nonspecific pulmonary nodules. She was diagnosed in March 2021. Colonoscopy 09/16/2019-fungating mass at the cecum, sessile polyps in the this sigmoid, transverse colon, and hepatic flexure, adenocarcinoma involving cecum biopsy, tubular adenoma and sessile serrated polyps CTs chest, abdomen, and pelvis 10/08/2019-extensive liver metastases, thoracic/abdominal adenopathy, cecal mass, dilated and thin thickened appendix, nonspecific pulmonary nodules Ultrasound-guided liver biopsy 10/13/2019-adenocarcinoma consistent with a metastatic  colorectal primary, MSS, tumor mutation burden 3, KRAS Q61, Cycle 1 FOLFOX 10/14/2019 Cycle 2 FOLFOX 10/27/2019 Cycle 3 FOLFOX 11/11/2019, Emend and Decadron added, 5-FU dose escalated Cycle 4 FOLFOX 11/25/2019, Udenyca added Cycle 5 FOLFOX 12/08/2019 Cycle 6 FOLFOX 12/23/2019 CT 01/04/2020-decreased size of multiple hepatic metastases, slight decrease in bulk of cecal mass, stable ileocecal adenopathy Cycle 7 FOLFOX 01/06/2020 Cycle 8 FOLFOX 01/31/2020 Cycle 9 FOLFOX 02/17/2020 (oxaliplatin dose reduced due to borderline ANC, persistent cold sensitivity) Cycle 10 FOLFOX 03/02/2020 Cycle 11 FOLFOX 03/16/2020 Cycle 12 FOLFOX 03/30/2020 (oxaliplatin held secondary to neuropathy) CT abdomen/pelvis 04/19/2020-no significant change in the abnormal soft tissue lesion in the region of the cecal tip involving the ileocecal valve and appendiceal orifice; slight interval progression of numerous hepatic metastases; similar appearance of small lymph nodes in the ileocolic mesentery with adjacent irregular focus of soft tissue also stable Cycle 1 FOLFIRI/Avastin 04/25/2020 Cycle 2 FOLFIRI/Avastin 05/11/2020 Cycle 3 FOLFIRI 05/25/2020 (Avastin held due to proteinuria) Cycle 4 FOLFIRI/Avastin 06/07/2020 Cycle 5 FOLFIRI/Avastin 06/22/2020 CTs 07/05/2020-mild improvement in multifocal liver metastases.  Mass at the base of the cecum is stable.  Persistent thickening of the appendix. Cycle 6 FOLFIRI/Avastin 07/05/2020 Cycle 7 FOLFIRI/Avastin 07/20/2020 Cycle 8 FOLFIRI/Avastin 08/03/2020 Cycle 9 FOLFIRI 09/05/2020, Avastin held secondary to proteinuria CT chest 09/05/2020-negative for pulmonary embolism, no acute disease, unchanged small pulmonary nodules Cycle 10 FOLFIRI 09/20/2020, Avastin held secondary to proteinuria Cycle 11 FOLFIRI 10/04/2020, Avastin held secondary to proteinuria CTs 10/17/2020-increase in size of liver metastases, stable cecal mass, slight decrease in size of right lower quadrant mesenteric lymph node Cycle  12 FOLFIRI 10/18/2020, Avastin held secondary to proteinuria Cycle 1 FOLFOX/Avastin 11/13/2020 Cycle 2 FOLFOX/Avastin 11/27/2020 Cycle 3 FOLFOX/Avastin 12/12/2020 Cycle 4 FOLFOX/Avastin  12/26/2020 CT 01/04/2021-bulky confluent liver metastases, some are larger and others are stable, increased ascites with suggestion of peritoneal thickening and nodularity, unchanged cecal mass 01/09/2021-every 2-week Avastin continued Cycle 1 Lonsurf 01/15/2021 Therapeutic paracentesis 01/18/2021, 01/24/2021-cytology negative Cycle 2 Lonsurf 02/12/2021 CT abdomen/pelvis 03/02/2021-diffuse hepatic metastases, not significantly changed, large volume ascites, peritoneal thickening and enhancement suspicious for carcinomatosis Cycle 3 Lonsurf 03/12/2021 2) Iron deficiency anemia secondary to #1.  3.  Pain secondary to #1-improved 4.  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  Diarrhea 01/20/2020.  C. difficile negative 7.  COVID-19 infection 08/13/2020, antibody infusion therapy on 08/19/2020 8.  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  Inflamed left labial cyst 09/29/2020-doxycycline    Disposition: Rachel Frank appears unchanged.  She is completing cycle 3 Lonsurf.  She continues Avastin every 2 weeks.  She has persistent ascites, scheduled for a therapeutic paracentesis today.  We discussed limiting volume of removal to 3 L.  She will begin Aldactone 25 mg twice daily.  She will return for lab, follow-up, Avastin in 2 weeks.  She will contact the office in the interim with any problems.  Patient seen with Dr. Benay Spice.    Ned Card ANP/GNP-BC   03/21/2021  1:39 PM  This was a shared visit with Ned Card.  [name] continues to be symptomatic from ascites.  Repeat cytology is have  been negative.  The BUN and creatinine are more elevated today.  We will limit the volume of the palliative paracentesis.  She will begin a trial of Aldactone.  I was present for greater than 50% of today's visit.  I performed medical decision making.  Julieanne Manson, MD

## 2021-03-21 NOTE — Patient Instructions (Signed)
[facility]  Discharge Instructions: Thank you for choosing [facility] to provide your oncology and hematology care.   If you have a lab appointment with the [facility], please go directly to the [facility] and check in at the registration area.   Wear comfortable clothing and clothing appropriate for easy access to any Portacath or PICC line.   We strive to give you quality time with your provider. You may need to reschedule your appointment if you arrive late (15 or more minutes).  Arriving late affects you and other patients whose appointments are after yours.  Also, if you miss three or more appointments without notifying the office, you may be dismissed from the clinic at the provider's discretion.      For prescription refill requests, have your pharmacy contact our office and allow 72 hours for refills to be completed.    Today you received the following chemotherapy and/or immunotherapy agents avastin     To help prevent nausea and vomiting after your treatment, we encourage you to take your nausea medication as directed.  BELOW ARE SYMPTOMS THAT SHOULD BE REPORTED IMMEDIATELY: *FEVER GREATER THAN 100.4 F (38 C) OR HIGHER *CHILLS OR SWEATING *NAUSEA AND VOMITING THAT IS NOT CONTROLLED WITH YOUR NAUSEA MEDICATION *UNUSUAL SHORTNESS OF BREATH *UNUSUAL BRUISING OR BLEEDING *URINARY PROBLEMS (pain or burning when urinating, or frequent urination) *BOWEL PROBLEMS (unusual diarrhea, constipation, pain near the anus) TENDERNESS IN MOUTH AND THROAT WITH OR WITHOUT PRESENCE OF ULCERS (sore throat, sores in mouth, or a toothache) UNUSUAL RASH, SWELLING OR PAIN  UNUSUAL VAGINAL DISCHARGE OR ITCHING   Items with * indicate a potential emergency and should be followed up as soon as possible or go to the Emergency Department if any problems should occur.  Please show the CHEMOTHERAPY ALERT CARD or IMMUNOTHERAPY ALERT CARD at check-in to the  Emergency Department and triage nurse.  Should you have questions after your visit or need to cancel or reschedule your appointment, please contact [facility]  Dept: [phone]  and follow the prompts.  Office hours are 8:00 a.m. to 4:30 p.m. Monday - Friday. Please note that voicemails left after 4:00 p.m. may not be returned until the following business day.  We are closed weekends and major holidays. You have access to a nurse at all times for urgent questions. Please call the main number to the clinic Dept: [phone] and follow the prompts.   For any non-urgent questions, you may also contact your provider using MyChart. We now offer e-Visits for anyone 21 and older to request care online for non-urgent symptoms. For details visit [email or web address].   Also download the MyChart app! Go to the app store, search "MyChart", open the app, select [facility], and log in with your MyChart username and password.  Due to Covid, a mask is required upon entering the hospital/clinic. If you do not have a mask, one will be given to you upon arrival. For doctor visits, patients may have 1 support person aged 19 or older with them. For treatment visits, patients cannot have anyone with them due to current Covid guidelines and our immunocompromised population.   Bevacizumab injection What is this medication? BEVACIZUMAB (be va SIZ yoo mab) is a monoclonal antibody. It is used to treatmany types of cancer. This medicine may be used for other purposes; ask your health care provider orpharmacist if you have questions. COMMON BRAND NAME(S): Avastin, MVASI, Zirabev What should  I tell my care team before I take this medication? They need to know if you have any of these conditions: diabetes heart disease high blood pressure history of coughing up blood prior anthracycline chemotherapy (e.g., doxorubicin, daunorubicin, epirubicin) recent or ongoing radiation therapy recent  or planning to have surgery stroke an unusual or allergic reaction to bevacizumab, hamster proteins, mouse proteins, other medicines, foods, dyes, or preservatives pregnant or trying to get pregnant breast-feeding How should I use this medication? This medicine is for infusion into a vein. It is given by a health careprofessional in a hospital or clinic setting. Talk to your pediatrician regarding the use of this medicine in children.Special care may be needed. Overdosage: If you think you have taken too much of this medicine contact apoison control center or emergency room at once. NOTE: This medicine is only for you. Do not share this medicine with others. What if I miss a dose? It is important not to miss your dose. Call your doctor or health careprofessional if you are unable to keep an appointment. What may interact with this medication? Interactions are not expected. This list may not describe all possible interactions. Give your health care provider a list of all the medicines, herbs, non-prescription drugs, or dietary supplements you use. Also tell them if you smoke, drink alcohol, or use illegaldrugs. Some items may interact with your medicine. What should I watch for while using this medication? Your condition will be monitored carefully while you are receiving this medicine. You will need important blood work and urine testing done while youare taking this medicine. This medicine may increase your risk to bruise or bleed. Call your doctor orhealth care professional if you notice any unusual bleeding. Before having surgery, talk to your health care provider to make sure it is ok. This drug can increase the risk of poor healing of your surgical site or wound. You will need to stop this drug for 28 days before surgery. After surgery, wait at least 28 days before restarting this drug. Make sure the surgical site or wound is healed enough before restarting this drug. Talk to your health  careprovider if questions. Do not become pregnant while taking this medicine or for 6 months after stopping it. Women should inform their doctor if they wish to become pregnant or think they might be pregnant. There is a potential for serious side effects to an unborn child. Talk to your health care professional or pharmacist for more information. Do not breast-feed an infant while taking this medicine andfor 6 months after the last dose. This medicine has caused ovarian failure in some women. This medicine may interfere with the ability to have a child. You should talk to your doctor orhealth care professional if you are concerned about your fertility. What side effects may I notice from receiving this medication? Side effects that you should report to your doctor or health care professionalas soon as possible: allergic reactions like skin rash, itching or hives, swelling of the face, lips, or tongue chest pain or chest tightness chills coughing up blood high fever seizures severe constipation signs and symptoms of bleeding such as bloody or black, tarry stools; red or dark-brown urine; spitting up blood or brown material that looks like coffee grounds; red spots on the skin; unusual bruising or bleeding from the eye, gums, or nose signs and symptoms of a blood clot such as breathing problems; chest pain; severe, sudden headache; pain, swelling, warmth in the leg signs and symptoms of  a stroke like changes in vision; confusion; trouble speaking or understanding; severe headaches; sudden numbness or weakness of the face, arm or leg; trouble walking; dizziness; loss of balance or coordination stomach pain sweating swelling of legs or ankles vomiting weight gain Side effects that usually do not require medical attention (report to yourdoctor or health care professional if they continue or are bothersome): back pain changes in taste decreased appetite dry skin nausea tiredness This list may  not describe all possible side effects. Call your doctor for medical advice about side effects. You may report side effects to FDA at1-800-FDA-1088. Where should I keep my medication? This drug is given in a hospital or clinic and will not be stored at home. NOTE: This sheet is a summary. It may not cover all possible information. If you have questions about this medicine, talk to your doctor, pharmacist, orhealth care provider.  2022 Elsevier/Gold Standard (2019-04-28 10:50:46)

## 2021-03-23 LAB — CYTOLOGY - NON PAP

## 2021-03-27 ENCOUNTER — Other Ambulatory Visit: Payer: Self-pay | Admitting: *Deleted

## 2021-03-27 DIAGNOSIS — C19 Malignant neoplasm of rectosigmoid junction: Secondary | ICD-10-CM

## 2021-03-30 ENCOUNTER — Ambulatory Visit (INDEPENDENT_AMBULATORY_CARE_PROVIDER_SITE_OTHER): Payer: 59 | Admitting: Family Medicine

## 2021-03-30 ENCOUNTER — Encounter: Payer: Self-pay | Admitting: Family Medicine

## 2021-03-30 ENCOUNTER — Other Ambulatory Visit: Payer: Self-pay

## 2021-03-30 ENCOUNTER — Ambulatory Visit (INDEPENDENT_AMBULATORY_CARE_PROVIDER_SITE_OTHER)
Admission: RE | Admit: 2021-03-30 | Discharge: 2021-03-30 | Disposition: A | Discharge status: Home / Self Care | Payer: 59 | Source: Ambulatory Visit | Attending: Family Medicine | Admitting: Family Medicine

## 2021-03-30 VITALS — BP 98/76 | HR 75 | Temp 97.8°F | Wt 162.5 lb

## 2021-03-30 DIAGNOSIS — R059 Cough, unspecified: Secondary | ICD-10-CM

## 2021-03-30 DIAGNOSIS — C19 Malignant neoplasm of rectosigmoid junction: Secondary | ICD-10-CM

## 2021-03-30 DIAGNOSIS — J019 Acute sinusitis, unspecified: Secondary | ICD-10-CM

## 2021-03-30 MED ORDER — AZITHROMYCIN 250 MG PO TABS: ORAL_TABLET | ORAL | 0 refills | Status: DC

## 2021-03-30 NOTE — Progress Notes (Signed)
   Subjective:    Patient ID: Rachel Frank, female    DOB: [birth year], 60 y.o.   MRN: [id number]  HPI Here with her son for one month of a dry cough. No fever or SOB or chest pain. She does have sinus congestion and some PND. She has been taking Mucinex off and on. Of note she has been dealing with significant ascites, and she has had 4-5 liters of fluid removed using paracentesis from her abdomen every 2 weeks.    Review of Systems  Constitutional:  Positive for fatigue. Negative for chills, diaphoresis and fever.  Respiratory:  Positive for cough. Negative for shortness of breath and wheezing.   Cardiovascular: Negative.   Gastrointestinal:  Positive for abdominal distention. Negative for abdominal pain.      Objective:   Physical Exam Constitutional:      Comments: Frail, in a wheelchair   HENT:     Right Ear: Tympanic membrane, ear canal and external ear normal.     Left Ear: Tympanic membrane, ear canal and external ear normal.     Nose: Nose normal.     Mouth/Throat:     Pharynx: Oropharynx is clear.  Eyes:     Conjunctiva/sclera: Conjunctivae normal.  Cardiovascular:     Rate and Rhythm: Normal rate and regular rhythm.     Pulses: Normal pulses.     Heart sounds: Normal heart sounds.  Pulmonary:     Effort: Pulmonary effort is normal.     Breath sounds: No wheezing, rhonchi or rales.     Comments: BS are quiet in the lower 1/3 of each lung  Lymphadenopathy:     Cervical: No cervical adenopathy.  Neurological:     Mental Status: She is alert.          Assessment & Plan:  She seems to have a sinusitis, so we will treat this with a Zpack. However I am concerned that she may have developed some pleural effusions, so we will send her for a CXR today. Alysia Penna, MD

## 2021-04-01 ENCOUNTER — Other Ambulatory Visit: Payer: Self-pay | Admitting: Oncology

## 2021-04-03 ENCOUNTER — Telehealth: Payer: Self-pay | Admitting: Family Medicine

## 2021-04-03 NOTE — Telephone Encounter (Signed)
PT daughter and the PT called in today to advise that last week we had noticed the BP for the PT was low. She states that since then it has been constantly low and they have been holding off on giving the PT her BP med since Friday/Saturday. They would like some advise on what to do. Wether not be something for her to take to bring it up. They would like some advice.

## 2021-04-04 ENCOUNTER — Inpatient Hospital Stay: Payer: 59

## 2021-04-04 ENCOUNTER — Telehealth: Payer: Self-pay

## 2021-04-04 ENCOUNTER — Ambulatory Visit (HOSPITAL_COMMUNITY)
Admission: RE | Admit: 2021-04-04 | Discharge: 2021-04-04 | Disposition: A | Discharge status: Home / Self Care | Payer: 59 | Source: Ambulatory Visit | Attending: Oncology | Admitting: Oncology

## 2021-04-04 ENCOUNTER — Other Ambulatory Visit: Payer: Self-pay

## 2021-04-04 ENCOUNTER — Inpatient Hospital Stay (HOSPITAL_BASED_OUTPATIENT_CLINIC_OR_DEPARTMENT_OTHER): Payer: 59 | Admitting: Oncology

## 2021-04-04 VITALS — HR 98

## 2021-04-04 VITALS — BP 125/88 | HR 108 | Temp 98.2°F | Resp 18 | Ht 62.0 in | Wt 167.6 lb

## 2021-04-04 DIAGNOSIS — C19 Malignant neoplasm of rectosigmoid junction: Secondary | ICD-10-CM

## 2021-04-04 LAB — CBC WITH DIFFERENTIAL (CANCER CENTER ONLY)
Abs Immature Granulocytes: 0.02 10*3/uL (ref 0.00–0.07)
Basophils Absolute: 0 10*3/uL (ref 0.0–0.1)
Basophils Relative: 0 %
Eosinophils Absolute: 0 10*3/uL (ref 0.0–0.5)
Eosinophils Relative: 1 %
HCT: 33.1 % — ABNORMAL LOW (ref 36.0–46.0)
Hemoglobin: 10.6 g/dL — ABNORMAL LOW (ref 12.0–15.0)
Immature Granulocytes: 1 %
Lymphocytes Relative: 27 %
Lymphs Abs: 0.9 10*3/uL (ref 0.7–4.0)
MCH: 29.4 pg (ref 26.0–34.0)
MCHC: 32 g/dL (ref 30.0–36.0)
MCV: 91.7 fL (ref 80.0–100.0)
Monocytes Absolute: 1.1 10*3/uL — ABNORMAL HIGH (ref 0.1–1.0)
Monocytes Relative: 32 %
Neutro Abs: 1.4 10*3/uL — ABNORMAL LOW (ref 1.7–7.7)
Neutrophils Relative %: 39 %
Platelet Count: 252 10*3/uL (ref 150–400)
RBC: 3.61 MIL/uL — ABNORMAL LOW (ref 3.87–5.11)
RDW: 22.5 % — ABNORMAL HIGH (ref 11.5–15.5)
WBC Count: 3.6 10*3/uL — ABNORMAL LOW (ref 4.0–10.5)
nRBC: 0 % (ref 0.0–0.2)

## 2021-04-04 LAB — CMP (CANCER CENTER ONLY)
ALT: 13 U/L (ref 0–44)
AST: 47 U/L — ABNORMAL HIGH (ref 15–41)
Albumin: 2.2 g/dL — ABNORMAL LOW (ref 3.5–5.0)
Alkaline Phosphatase: 774 U/L — ABNORMAL HIGH (ref 38–126)
Anion gap: 12 (ref 5–15)
BUN: 40 mg/dL — ABNORMAL HIGH (ref 8–23)
CO2: 20 mmol/L — ABNORMAL LOW (ref 22–32)
Calcium: 8.3 mg/dL — ABNORMAL LOW (ref 8.9–10.3)
Chloride: 95 mmol/L — ABNORMAL LOW (ref 98–111)
Creatinine: 1.49 mg/dL — ABNORMAL HIGH (ref 0.44–1.00)
GFR, Estimated: 39 mL/min — ABNORMAL LOW (ref >60)
Glucose, Bld: 130 mg/dL — ABNORMAL HIGH (ref 70–99)
Potassium: 4.6 mmol/L (ref 3.5–5.1)
Sodium: 127 mmol/L — ABNORMAL LOW (ref 135–145)
Total Bilirubin: 3.6 mg/dL (ref 0.3–1.2)
Total Protein: 6.1 g/dL — ABNORMAL LOW (ref 6.5–8.1)

## 2021-04-04 MED ORDER — SODIUM CHLORIDE 0.9% FLUSH
10.0000 mL | INTRAVENOUS | Status: DC | PRN
Start: 2021-04-04 — End: 2021-04-04
  Administered 2021-04-04: 10 mL

## 2021-04-04 MED ORDER — DIPHENOXYLATE-ATROPINE 2.5-0.025 MG PO TABS: 1.0000 | ORAL_TABLET | Freq: Four times a day (QID) | ORAL | 0 refills | Status: AC | PRN

## 2021-04-04 MED ORDER — LIDOCAINE HCL 1 % IJ SOLN
INTRAMUSCULAR | Status: AC
  Filled 2021-04-04: qty 20

## 2021-04-04 MED ORDER — SODIUM CHLORIDE 0.9 % IV SOLN
5.0000 mg/kg | Freq: Once | INTRAVENOUS | Status: AC
  Administered 2021-04-04: 350 mg via INTRAVENOUS
  Filled 2021-04-04: qty 14

## 2021-04-04 MED ORDER — SODIUM CHLORIDE 0.9 % IV SOLN: Freq: Once | INTRAVENOUS | Status: AC

## 2021-04-04 MED ORDER — HEPARIN SOD (PORK) LOCK FLUSH 100 UNIT/ML IV SOLN
500.0000 [IU] | Freq: Once | INTRAVENOUS | Status: AC | PRN
  Administered 2021-04-04: 500 [IU]

## 2021-04-04 NOTE — Progress Notes (Signed)
Ok to treat with lab results today

## 2021-04-04 NOTE — Telephone Encounter (Signed)
Tell her to stop all 3 of these medications and we will watch the BP over the next few weeks

## 2021-04-04 NOTE — Telephone Encounter (Signed)
Tell her to stop the Amlodipine and continue all the other medications. Follow up in 2 weeks

## 2021-04-04 NOTE — Telephone Encounter (Signed)
Last office visit- 03/30/21.  Please advise

## 2021-04-04 NOTE — Telephone Encounter (Signed)
Patient seen by Dr. Benay Spice today  Vitals are within treatment parameters.  Labs reviewed by Dr. Benay Spice    Total Bili 3.6 BUN 40 Creatinine 1.49  Ok to treat per Dr Benay Spice   Per physician team, patient is ready for treatment and there are NO modifications to the treatment plan.

## 2021-04-04 NOTE — Progress Notes (Signed)
[facility] OFFICE PROGRESS NOTE   Diagnosis: Colon cancer  INTERVAL HISTORY:   Rachel Frank returns as scheduled.  She completed another treatment with bevacizumab on 03/21/2021.  She underwent a paracentesis for 3 L on 03/21/2021.  She felt better following the paracentesis procedure.  She takes oxycodone once daily for relief of pain.  Rachel Frank reports minimal ambulation in the home.  She has dyspnea.  Objective:  Vital signs in last 24 hours:  Blood pressure 125/88, pulse (!) 108, temperature 98.2 F (36.8 C), temperature source Oral, resp. rate 18, height _0  (1.575 m), weight 167 lb 9.6 oz (76 kg), SpO2 100 %.    HEENT: No thrush or ulcers.  The mucous membranes are dry. Resp: Lungs clear bilaterally Cardio: Regular rate and rhythm GI: Distended with ascites, no mass, no hepatomegaly Vascular: 1+ edema to lower leg bilaterally    Portacath/PICC-without erythema  Lab Results:  Lab Results  Component Value Date   WBC 3.6 (L) 04/04/2021   HGB 10.6 (L) 04/04/2021   HCT 33.1 (L) 04/04/2021   MCV 91.7 04/04/2021   PLT 252 04/04/2021   NEUTROABS 1.4 (L) 04/04/2021    CMP  Lab Results  Component Value Date   NA 127 (L) 04/04/2021   K 4.6 04/04/2021   CL 95 (L) 04/04/2021   CO2 20 (L) 04/04/2021   GLUCOSE 130 (H) 04/04/2021   BUN 40 (H) 04/04/2021   CREATININE 1.49 (H) 04/04/2021   CALCIUM 8.3 (L) 04/04/2021   PROT 6.1 (L) 04/04/2021   ALBUMIN 2.2 (L) 04/04/2021   AST 47 (H) 04/04/2021   ALT 13 04/04/2021   ALKPHOS 774 (H) 04/04/2021   BILITOT 3.6 (HH) 04/04/2021   GFRNONAA 39 (L) 04/04/2021   GFRAA >60 03/30/2020    Lab Results  Component Value Date   CEA1 75,300.51 (H) 12/12/2020   CEA 45,662.00 (H) 03/21/2021    Medications: I have reviewed the patient's current medications.   Assessment/Plan:  Stage IV adenocarcinoma of the cecum. She presented with a large cecal mass, extensive hepatic metastatic disease; mild thoracic and  abdominal adenopathy. She also has several small nonspecific pulmonary nodules. She was diagnosed in March 2021. Colonoscopy 09/16/2019-fungating mass at the cecum, sessile polyps in the this sigmoid, transverse colon, and hepatic flexure, adenocarcinoma involving cecum biopsy, tubular adenoma and sessile serrated polyps CTs chest, abdomen, and pelvis 10/08/2019-extensive liver metastases, thoracic/abdominal adenopathy, cecal mass, dilated and thin thickened appendix, nonspecific pulmonary nodules Ultrasound-guided liver biopsy 10/13/2019-adenocarcinoma consistent with a metastatic colorectal primary, MSS, tumor mutation burden 3, KRAS Q61, Cycle 1 FOLFOX 10/14/2019 Cycle 2 FOLFOX 10/27/2019 Cycle 3 FOLFOX 11/11/2019, Emend and Decadron added, 5-FU dose escalated Cycle 4 FOLFOX 11/25/2019, Udenyca added Cycle 5 FOLFOX 12/08/2019 Cycle 6 FOLFOX 12/23/2019 CT 01/04/2020-decreased size of multiple hepatic metastases, slight decrease in bulk of cecal mass, stable ileocecal adenopathy Cycle 7 FOLFOX 01/06/2020 Cycle 8 FOLFOX 01/31/2020 Cycle 9 FOLFOX 02/17/2020 (oxaliplatin dose reduced due to borderline ANC, persistent cold sensitivity) Cycle 10 FOLFOX 03/02/2020 Cycle 11 FOLFOX 03/16/2020 Cycle 12 FOLFOX 03/30/2020 (oxaliplatin held secondary to neuropathy) CT abdomen/pelvis 04/19/2020-no significant change in the abnormal soft tissue lesion in the region of the cecal tip involving the ileocecal valve and appendiceal orifice; slight interval progression of numerous hepatic metastases; similar appearance of small lymph nodes in the ileocolic mesentery with adjacent irregular focus of soft tissue also stable Cycle 1 FOLFIRI/Avastin 04/25/2020 Cycle 2 FOLFIRI/Avastin 05/11/2020 Cycle 3 FOLFIRI 05/25/2020 (Avastin held due to proteinuria) Cycle  4 FOLFIRI/Avastin 06/07/2020 Cycle 5 FOLFIRI/Avastin 06/22/2020 CTs 07/05/2020-mild improvement in multifocal liver metastases.  Mass at the base of the cecum is stable.   Persistent thickening of the appendix. Cycle 6 FOLFIRI/Avastin 07/05/2020 Cycle 7 FOLFIRI/Avastin 07/20/2020 Cycle 8 FOLFIRI/Avastin 08/03/2020 Cycle 9 FOLFIRI 09/05/2020, Avastin held secondary to proteinuria CT chest 09/05/2020-negative for pulmonary embolism, no acute disease, unchanged small pulmonary nodules Cycle 10 FOLFIRI 09/20/2020, Avastin held secondary to proteinuria Cycle 11 FOLFIRI 10/04/2020, Avastin held secondary to proteinuria CTs 10/17/2020-increase in size of liver metastases, stable cecal mass, slight decrease in size of right lower quadrant mesenteric lymph node Cycle 12 FOLFIRI 10/18/2020, Avastin held secondary to proteinuria Cycle 1 FOLFOX/Avastin 11/13/2020 Cycle 2 FOLFOX/Avastin 11/27/2020 Cycle 3 FOLFOX/Avastin 12/12/2020 Cycle 4 FOLFOX/Avastin 12/26/2020 CT 01/04/2021-bulky confluent liver metastases, some are larger and others are stable, increased ascites with suggestion of peritoneal thickening and nodularity, unchanged cecal mass 01/09/2021-every 2-week Avastin continued Cycle 1 Lonsurf 01/15/2021 Therapeutic paracentesis 01/18/2021, 01/24/2021-cytology negative Cycle 2 Lonsurf 02/12/2021 CT abdomen/pelvis 03/02/2021-diffuse hepatic metastases, not significantly changed, large volume ascites, peritoneal thickening and enhancement suspicious for carcinomatosis Cycle 3 Lonsurf 03/12/2021 2) Iron deficiency anemia secondary to #1.  3.  Pain secondary to #1-improved 4.  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  Diarrhea 01/20/2020.  C. difficile negative 7.  COVID-19 infection 08/13/2020, antibody infusion therapy on 08/19/2020 8.  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  Inflamed left labial cyst 09/29/2020-doxycycline     Disposition: Rachel Frank  has metastatic colon cancer.  She has completed 3 cycles of Lonsurf/bevacizumab.  Her performance status has not improved.  She has refractory ascites.  The creatinine is higher today.  She has mild neutropenia.  No I discussed the poor prognosis with Rachel Frank and her husband.  I suspect the metastatic tumor is progressing despite the current treatment.  She wants to continue treatment for now.  She will discontinue HCTZ and spironolactone.  She would like to complete another palliative paracentesis today.  We will check for hydronephrosis if the creatinine has not improved when she returns next week.  The next cycle of Lonsurf will be held until she returns next week.  We will follow-up on the neutrophil count and bilirubin prior to resuming Lonsurf.  She will return for an office visit on 04/10/2021.  Betsy Coder, MD  04/04/2021  11:04 AM

## 2021-04-04 NOTE — Progress Notes (Signed)
Patient presents for treatment. RN assessment completed along with the following:  Treatment Conditions (labs/vitals/weight) reviewed - Yes, and ok to treat per Dr Benay Spice    Oncology Treatment Attestation completed for current therapy- Yes, on date 04/03/21 Informed consent completed and reflects current therapy/intent - Yes, on date 05/17/20             Provider progress note reviewed - Yes, today's provider note was reviewed. Treatment/Antibody/Supportive plan reviewed Yes, and there are no adjustments needed for today's treatment."} S&H and other orders reviewed - Yes, and there are no additional orders identified. Previous treatment date reviewed - Yes, and the appropriate amount of time has elapsed between treatments. Clinic Hand Off Received from - Carolynn Sayers LPN  Patient to proceed with treatment.

## 2021-04-04 NOTE — Telephone Encounter (Signed)
Per Dr Benay Spice Pt informed to discontinue hydrochlorothiazide and spironolactone. Pt verbalized understanding.

## 2021-04-04 NOTE — Patient Instructions (Signed)
Rachel Frank  Discharge Instructions: Thank you for choosing [facility] to provide your oncology and hematology care.   If you have a lab appointment with the [facility], please go directly to the [facility] and check in at the registration area.   Wear comfortable clothing and clothing appropriate for easy access to any Portacath or PICC line.   We strive to give you quality time with your provider. You may need to reschedule your appointment if you arrive late (15 or more minutes).  Arriving late affects you and other patients whose appointments are after yours.  Also, if you miss three or more appointments without notifying the office, you may be dismissed from the clinic at the provider's discretion.      For prescription refill requests, have your pharmacy contact our office and allow 72 hours for refills to be completed.    Today you received the following chemotherapy and/or immunotherapy agents avastin     To help prevent nausea and vomiting after your treatment, we encourage you to take your nausea medication as directed.  BELOW ARE SYMPTOMS THAT SHOULD BE REPORTED IMMEDIATELY: *FEVER GREATER THAN 100.4 F (38 C) OR HIGHER *CHILLS OR SWEATING *NAUSEA AND VOMITING THAT IS NOT CONTROLLED WITH YOUR NAUSEA MEDICATION *UNUSUAL SHORTNESS OF BREATH *UNUSUAL BRUISING OR BLEEDING *URINARY PROBLEMS (pain or burning when urinating, or frequent urination) *BOWEL PROBLEMS (unusual diarrhea, constipation, pain near the anus) TENDERNESS IN MOUTH AND THROAT WITH OR WITHOUT PRESENCE OF ULCERS (sore throat, sores in mouth, or a toothache) UNUSUAL RASH, SWELLING OR PAIN  UNUSUAL VAGINAL DISCHARGE OR ITCHING   Items with * indicate a potential emergency and should be followed up as soon as possible or go to the Emergency Department if any problems should occur.  Please show the CHEMOTHERAPY ALERT CARD or IMMUNOTHERAPY ALERT CARD at check-in to the  Emergency Department and triage nurse.  Should you have questions after your visit or need to cancel or reschedule your appointment, please contact [facility]  Dept: [phone]  and follow the prompts.  Office hours are 8:00 a.m. to 4:30 p.m. Monday - Friday. Please note that voicemails left after 4:00 p.m. may not be returned until the following business day.  We are closed weekends and major holidays. You have access to a nurse at all times for urgent questions. Please call the main number to the clinic Dept: [phone] and follow the prompts.   For any non-urgent questions, you may also contact your provider using MyChart. We now offer e-Visits for anyone 21 and older to request care online for non-urgent symptoms. For details visit [email or web address].   Also download the MyChart app! Go to the app store, search "MyChart", open the app, select [facility], and log in with your MyChart username and password.  Due to Covid, a mask is required upon entering the hospital/clinic. If you do not have a mask, one will be given to you upon arrival. For doctor visits, patients may have 1 support person aged 19 or older with them. For treatment visits, patients cannot have anyone with them due to current Covid guidelines and our immunocompromised population.   Bevacizumab injection What is this medication? BEVACIZUMAB (be va SIZ yoo mab) is a monoclonal antibody. It is used to treatmany types of cancer. This medicine may be used for other purposes; ask your health care provider orpharmacist if you have questions. COMMON BRAND NAME(S): Avastin, MVASI, Zirabev What should  I tell my care team before I take this medication? They need to know if you have any of these conditions: diabetes heart disease high blood pressure history of coughing up blood prior anthracycline chemotherapy (e.g., doxorubicin, daunorubicin, epirubicin) recent or ongoing radiation therapy recent  or planning to have surgery stroke an unusual or allergic reaction to bevacizumab, hamster proteins, mouse proteins, other medicines, foods, dyes, or preservatives pregnant or trying to get pregnant breast-feeding How should I use this medication? This medicine is for infusion into a vein. It is given by a health careprofessional in a hospital or clinic setting. Talk to your pediatrician regarding the use of this medicine in children.Special care may be needed. Overdosage: If you think you have taken too much of this medicine contact apoison control center or emergency room at once. NOTE: This medicine is only for you. Do not share this medicine with others. What if I miss a dose? It is important not to miss your dose. Call your doctor or health careprofessional if you are unable to keep an appointment. What may interact with this medication? Interactions are not expected. This list may not describe all possible interactions. Give your health care provider a list of all the medicines, herbs, non-prescription drugs, or dietary supplements you use. Also tell them if you smoke, drink alcohol, or use illegaldrugs. Some items may interact with your medicine. What should I watch for while using this medication? Your condition will be monitored carefully while you are receiving this medicine. You will need important blood work and urine testing done while youare taking this medicine. This medicine may increase your risk to bruise or bleed. Call your doctor orhealth care professional if you notice any unusual bleeding. Before having surgery, talk to your health care provider to make sure it is ok. This drug can increase the risk of poor healing of your surgical site or wound. You will need to stop this drug for 28 days before surgery. After surgery, wait at least 28 days before restarting this drug. Make sure the surgical site or wound is healed enough before restarting this drug. Talk to your health  careprovider if questions. Do not become pregnant while taking this medicine or for 6 months after stopping it. Women should inform their doctor if they wish to become pregnant or think they might be pregnant. There is a potential for serious side effects to an unborn child. Talk to your health care professional or pharmacist for more information. Do not breast-feed an infant while taking this medicine andfor 6 months after the last dose. This medicine has caused ovarian failure in some women. This medicine may interfere with the ability to have a child. You should talk to your doctor orhealth care professional if you are concerned about your fertility. What side effects may I notice from receiving this medication? Side effects that you should report to your doctor or health care professionalas soon as possible: allergic reactions like skin rash, itching or hives, swelling of the face, lips, or tongue chest pain or chest tightness chills coughing up blood high fever seizures severe constipation signs and symptoms of bleeding such as bloody or black, tarry stools; red or dark-brown urine; spitting up blood or brown material that looks like coffee grounds; red spots on the skin; unusual bruising or bleeding from the eye, gums, or nose signs and symptoms of a blood clot such as breathing problems; chest pain; severe, sudden headache; pain, swelling, warmth in the leg signs and symptoms of  a stroke like changes in vision; confusion; trouble speaking or understanding; severe headaches; sudden numbness or weakness of the face, arm or leg; trouble walking; dizziness; loss of balance or coordination stomach pain sweating swelling of legs or ankles vomiting weight gain Side effects that usually do not require medical attention (report to yourdoctor or health care professional if they continue or are bothersome): back pain changes in taste decreased appetite dry skin nausea tiredness This list may  not describe all possible side effects. Call your doctor for medical advice about side effects. You may report side effects to FDA at1-800-FDA-1088. Where should I keep my medication? This drug is given in a hospital or clinic and will not be stored at home. NOTE: This sheet is a summary. It may not cover all possible information. If you have questions about this medicine, talk to your doctor, pharmacist, orhealth care provider.  2022 [employer]vier/Gold Standard (2019-04-28 10:50:46)

## 2021-04-04 NOTE — Telephone Encounter (Signed)
Please advise 

## 2021-04-04 NOTE — Procedures (Signed)
Ultrasound-guided diagnostic and therapeutic paracentesis performed yielding 3 liters (maximum ordered) of yellow fluid. No immediate complications. A portion of the fluid was sent to the lab for cytology. EBL none.

## 2021-04-05 NOTE — Telephone Encounter (Signed)
Spoke with patient and husband [name].  Informed them both of Dr. Sarajane Jews message.  Voiced understanding.

## 2021-04-06 LAB — CYTOLOGY - NON PAP

## 2021-04-07 ENCOUNTER — Other Ambulatory Visit: Payer: Self-pay

## 2021-04-07 ENCOUNTER — Emergency Department (HOSPITAL_BASED_OUTPATIENT_CLINIC_OR_DEPARTMENT_OTHER): Payer: 59

## 2021-04-07 ENCOUNTER — Encounter (HOSPITAL_BASED_OUTPATIENT_CLINIC_OR_DEPARTMENT_OTHER): Payer: Self-pay | Admitting: Emergency Medicine

## 2021-04-07 ENCOUNTER — Inpatient Hospital Stay (HOSPITAL_BASED_OUTPATIENT_CLINIC_OR_DEPARTMENT_OTHER)
Admission: EM | Admit: 2021-04-07 | Discharge: 2021-04-12 | DRG: 176 | Disposition: A | Discharge status: Hospice | Payer: 59 | Attending: Family Medicine | Admitting: Family Medicine

## 2021-04-07 DIAGNOSIS — Z8616 Personal history of COVID-19: Secondary | ICD-10-CM

## 2021-04-07 DIAGNOSIS — K644 Residual hemorrhoidal skin tags: Secondary | ICD-10-CM | POA: Diagnosis present

## 2021-04-07 DIAGNOSIS — E8809 Other disorders of plasma-protein metabolism, not elsewhere classified: Secondary | ICD-10-CM | POA: Diagnosis present

## 2021-04-07 DIAGNOSIS — Z8249 Family history of ischemic heart disease and other diseases of the circulatory system: Secondary | ICD-10-CM | POA: Diagnosis not present

## 2021-04-07 DIAGNOSIS — E785 Hyperlipidemia, unspecified: Secondary | ICD-10-CM | POA: Diagnosis present

## 2021-04-07 DIAGNOSIS — Z79899 Other long term (current) drug therapy: Secondary | ICD-10-CM

## 2021-04-07 DIAGNOSIS — D509 Iron deficiency anemia, unspecified: Secondary | ICD-10-CM | POA: Diagnosis present

## 2021-04-07 DIAGNOSIS — I82412 Acute embolism and thrombosis of left femoral vein: Secondary | ICD-10-CM

## 2021-04-07 DIAGNOSIS — I2699 Other pulmonary embolism without acute cor pulmonale: Secondary | ICD-10-CM | POA: Diagnosis not present

## 2021-04-07 DIAGNOSIS — C787 Secondary malignant neoplasm of liver and intrahepatic bile duct: Secondary | ICD-10-CM

## 2021-04-07 DIAGNOSIS — Z7401 Bed confinement status: Secondary | ICD-10-CM

## 2021-04-07 DIAGNOSIS — E871 Hypo-osmolality and hyponatremia: Secondary | ICD-10-CM

## 2021-04-07 DIAGNOSIS — I82442 Acute embolism and thrombosis of left tibial vein: Secondary | ICD-10-CM | POA: Diagnosis present

## 2021-04-07 DIAGNOSIS — R9431 Abnormal electrocardiogram [ECG] [EKG]: Secondary | ICD-10-CM

## 2021-04-07 DIAGNOSIS — R748 Abnormal levels of other serum enzymes: Secondary | ICD-10-CM | POA: Diagnosis present

## 2021-04-07 DIAGNOSIS — I82432 Acute embolism and thrombosis of left popliteal vein: Secondary | ICD-10-CM | POA: Diagnosis present

## 2021-04-07 DIAGNOSIS — I10 Essential (primary) hypertension: Secondary | ICD-10-CM | POA: Diagnosis not present

## 2021-04-07 DIAGNOSIS — R188 Other ascites: Secondary | ICD-10-CM | POA: Diagnosis present

## 2021-04-07 DIAGNOSIS — Z9221 Personal history of antineoplastic chemotherapy: Secondary | ICD-10-CM | POA: Diagnosis not present

## 2021-04-07 DIAGNOSIS — Z515 Encounter for palliative care: Secondary | ICD-10-CM | POA: Diagnosis not present

## 2021-04-07 DIAGNOSIS — Z66 Do not resuscitate: Secondary | ICD-10-CM | POA: Diagnosis present

## 2021-04-07 DIAGNOSIS — C180 Malignant neoplasm of cecum: Secondary | ICD-10-CM | POA: Diagnosis present

## 2021-04-07 DIAGNOSIS — Z7189 Other specified counseling: Secondary | ICD-10-CM | POA: Diagnosis not present

## 2021-04-07 DIAGNOSIS — D649 Anemia, unspecified: Secondary | ICD-10-CM | POA: Diagnosis present

## 2021-04-07 DIAGNOSIS — I82452 Acute embolism and thrombosis of left peroneal vein: Secondary | ICD-10-CM | POA: Diagnosis present

## 2021-04-07 DIAGNOSIS — I2602 Saddle embolus of pulmonary artery with acute cor pulmonale: Secondary | ICD-10-CM | POA: Diagnosis not present

## 2021-04-07 DIAGNOSIS — C189 Malignant neoplasm of colon, unspecified: Secondary | ICD-10-CM | POA: Diagnosis not present

## 2021-04-07 DIAGNOSIS — C19 Malignant neoplasm of rectosigmoid junction: Secondary | ICD-10-CM | POA: Diagnosis not present

## 2021-04-07 DIAGNOSIS — G893 Neoplasm related pain (acute) (chronic): Secondary | ICD-10-CM

## 2021-04-07 LAB — COMPREHENSIVE METABOLIC PANEL
ALT: 11 U/L (ref 0–44)
AST: 40 U/L (ref 15–41)
Albumin: 2 g/dL — ABNORMAL LOW (ref 3.5–5.0)
Alkaline Phosphatase: 619 U/L — ABNORMAL HIGH (ref 38–126)
Anion gap: 8 (ref 5–15)
BUN: 30 mg/dL — ABNORMAL HIGH (ref 8–23)
CO2: 22 mmol/L (ref 22–32)
Calcium: 7.8 mg/dL — ABNORMAL LOW (ref 8.9–10.3)
Chloride: 94 mmol/L — ABNORMAL LOW (ref 98–111)
Creatinine, Ser: 0.9 mg/dL (ref 0.44–1.00)
GFR, Estimated: >60 mL/min (ref >60)
Glucose, Bld: 100 mg/dL — ABNORMAL HIGH (ref 70–99)
Potassium: 4.4 mmol/L (ref 3.5–5.1)
Sodium: 124 mmol/L — ABNORMAL LOW (ref 135–145)
Total Bilirubin: 2.6 mg/dL — ABNORMAL HIGH (ref 0.3–1.2)
Total Protein: 5.7 g/dL — ABNORMAL LOW (ref 6.5–8.1)

## 2021-04-07 LAB — CBC WITH DIFFERENTIAL/PLATELET
Abs Immature Granulocytes: 0.01 10*3/uL (ref 0.00–0.07)
Basophils Absolute: 0 10*3/uL (ref 0.0–0.1)
Basophils Relative: 1 %
Eosinophils Absolute: 0 10*3/uL (ref 0.0–0.5)
Eosinophils Relative: 1 %
HCT: 32.3 % — ABNORMAL LOW (ref 36.0–46.0)
Hemoglobin: 10.5 g/dL — ABNORMAL LOW (ref 12.0–15.0)
Immature Granulocytes: 0 %
Lymphocytes Relative: 27 %
Lymphs Abs: 1.1 10*3/uL (ref 0.7–4.0)
MCH: 29.2 pg (ref 26.0–34.0)
MCHC: 32.5 g/dL (ref 30.0–36.0)
MCV: 90 fL (ref 80.0–100.0)
Monocytes Absolute: 1.2 10*3/uL — ABNORMAL HIGH (ref 0.1–1.0)
Monocytes Relative: 29 %
Neutro Abs: 1.7 10*3/uL (ref 1.7–7.7)
Neutrophils Relative %: 42 %
Platelets: 243 10*3/uL (ref 150–400)
RBC: 3.59 MIL/uL — ABNORMAL LOW (ref 3.87–5.11)
RDW: 21.4 % — ABNORMAL HIGH (ref 11.5–15.5)
WBC: 4 10*3/uL (ref 4.0–10.5)
nRBC: 0 % (ref 0.0–0.2)

## 2021-04-07 LAB — TROPONIN I (HIGH SENSITIVITY)
Troponin I (High Sensitivity): 16 ng/L (ref <18)
Troponin I (High Sensitivity): 16 ng/L (ref <18)

## 2021-04-07 LAB — RESP PANEL BY RT-PCR (FLU A&B, COVID) ARPGX2
Influenza A by PCR: NEGATIVE
Influenza B by PCR: NEGATIVE
SARS Coronavirus 2 by RT PCR: NEGATIVE

## 2021-04-07 LAB — PROTIME-INR
INR: 1.9 — ABNORMAL HIGH (ref 0.8–1.2)
Prothrombin Time: 21.8 seconds — ABNORMAL HIGH (ref 11.4–15.2)

## 2021-04-07 MED ORDER — APIXABAN 5 MG PO TABS
10.0000 mg | ORAL_TABLET | Freq: Two times a day (BID) | ORAL | Status: DC
  Administered 2021-04-07 – 2021-04-12 (×10): 10 mg via ORAL
  Filled 2021-04-07 (×2): qty 2
  Filled 2021-04-07: qty 4
  Filled 2021-04-07 (×7): qty 2

## 2021-04-07 MED ORDER — HEPARIN (PORCINE) 25000 UT/250ML-% IV SOLN
1300.0000 [IU]/h | INTRAVENOUS | Status: DC
  Administered 2021-04-07: 1300 [IU]/h via INTRAVENOUS
  Filled 2021-04-07: qty 250

## 2021-04-07 MED ORDER — ACETAMINOPHEN 325 MG PO TABS
650.0000 mg | ORAL_TABLET | Freq: Four times a day (QID) | ORAL | Status: DC | PRN
  Administered 2021-04-09 – 2021-04-11 (×3): 650 mg via ORAL
  Filled 2021-04-07 (×3): qty 2

## 2021-04-07 MED ORDER — SODIUM CHLORIDE 0.9 % IV SOLN: INTRAVENOUS | Status: DC

## 2021-04-07 MED ORDER — HEPARIN BOLUS VIA INFUSION
5000.0000 [IU] | Freq: Once | INTRAVENOUS | Status: AC
  Administered 2021-04-07: 5000 [IU] via INTRAVENOUS

## 2021-04-07 MED ORDER — APIXABAN 5 MG PO TABS: 5.0000 mg | ORAL_TABLET | Freq: Two times a day (BID) | ORAL | Status: DC

## 2021-04-07 MED ORDER — SODIUM CHLORIDE 0.9% FLUSH: 10.0000 mL | Freq: Two times a day (BID) | INTRAVENOUS | Status: DC

## 2021-04-07 MED ORDER — AMLODIPINE BESYLATE 5 MG PO TABS
5.0000 mg | ORAL_TABLET | Freq: Every day | ORAL | Status: DC
  Administered 2021-04-08 – 2021-04-12 (×5): 5 mg via ORAL
  Filled 2021-04-07 (×5): qty 1

## 2021-04-07 MED ORDER — ACETAMINOPHEN 650 MG RE SUPP: 650.0000 mg | Freq: Four times a day (QID) | RECTAL | Status: DC | PRN

## 2021-04-07 MED ORDER — CHLORHEXIDINE GLUCONATE CLOTH 2 % EX PADS
6.0000 | MEDICATED_PAD | Freq: Every day | CUTANEOUS | Status: DC
  Administered 2021-04-08 – 2021-04-12 (×5): 6 via TOPICAL

## 2021-04-07 MED ORDER — SODIUM CHLORIDE 0.9% FLUSH
10.0000 mL | INTRAVENOUS | Status: DC | PRN
  Administered 2021-04-11: 10 mL

## 2021-04-07 MED ORDER — IOHEXOL 350 MG/ML SOLN
80.0000 mL | Freq: Once | INTRAVENOUS | Status: AC | PRN
  Administered 2021-04-07: 80 mL via INTRAVENOUS

## 2021-04-07 NOTE — ED Notes (Signed)
US bedside

## 2021-04-07 NOTE — ED Triage Notes (Signed)
Pt c/o swelling to left leg and calf onset yesterday. Pt reports that she sits for long periods of time due to her legs being swollen. Pt is not able to ambulate much due to stage 4 colon cancer.

## 2021-04-07 NOTE — ED Notes (Signed)
Patient transported to CT 

## 2021-04-07 NOTE — H&P (Signed)
History and Physical    Rachel Frank CXK:[id number] DOB: Jul 06, 1961 DOA: 04/07/2021  PCP: [name], MD   Patient coming from: Home  Chief Complaint: Left leg pain and swelling. Chest pain  HPI: Rachel Frank is a 60 y.o. female with medical history significant for Stage 4 colon cancer followed by Oncology, HTN, HLD who presents with swelling of her legs that is worse on the left than the right.  She reports that this morning she had increasing swelling in the left leg with pain in her left distal thigh and calf.  The pain has become worse over the course of the day.  She developed some right-sided chest pain that occurred with deep inspiration.  She has not had any chest pressure or palpitations.  She reports she initially thought the pain was related to the metastasis in her liver that cause chronic pain but this pain was more sharp when she would take a deep breath she states.  She states she has not had any shortness of breath.  She denies any trauma or injury to her abdomen or chest wall.  Not had any syncope.  She denies any fever, cough.  She had a paracentesis recently without complications.  She found it difficult to walk secondary to the pain in her left calf.  Lives with her family.  Denies tobacco, alcohol, illicit drug use.  ED Course: In the emergency room hemodynamically stable with no hypoxia.  She was found to have a left leg DVT as well as bilateral PEs without right heart strain on CT angiography of her chest.  She was in placed on a heparin infusion.  She is also noted to be hyponatremic with a sodium of 124.  Sodium was 127 a few days ago.  Labs revealed WBC of 4000 hemoglobin 10.5 hematocrit 32.3 platelets 243,000 sodium 124 potassium 4.4 chloride 94 bicarb 22 creatinine 0.90 BUN 30 glucose 100 alkaline phosphatase 619 which is decreased from 774 few days ago albumin 2.0 AST 40 ALT 11 bilirubin 2.6 decreased from 3.6 a few days ago.  Troponin was 16.  INR 1.9.  Hospitalist  service was asked to admit patient for further management  Review of Systems:  General: Denies fever, chills, weight loss, night sweats. Denies dizziness.  Denies change in appetite HENT: Denies head trauma, headache, denies change in hearing, tinnitus.  Denies nasal congestion. Denies sore throat,  Denies difficulty swallowing Eyes: Denies blurry vision, pain in eye, drainage.  Denies discoloration of eyes. Neck: Denies pain.  Denies swelling.  Denies pain with movement. Cardiovascular: Reports right sided chest pain with deep inspiration. Denies palpitations.  Reports edema more on left than right.  Denies orthopnea Respiratory: Denies shortness of breath, cough.  Denies wheezing.  Denies sputum production Gastrointestinal: Denies abdominal pain, swelling.  Denies nausea, vomiting, diarrhea.  Denies melena.  Denies hematemesis. Musculoskeletal: Reports pain in left thigh and calf. Denies limitation of movement.  Genitourinary: Denies pelvic pain.  Denies urinary frequency or hesitancy.  Denies dysuria.  Skin: Denies rash.  Denies petechiae, purpura, ecchymosis. Neurological: Denies syncope.  Denies seizure activity. Denies slurred speech, drooping face.  Denies visual change. Psychiatric: Denies depression, anxiety. Denies hallucinations.  Past Medical History:  Diagnosis Frank   Anemia    iron deficiency   Gynecological examination    [facility]   Heart murmur    Hyperlipidemia    on simvastatin   Hypertension    met colon ca to  liver dx'd 10/2018    Past Surgical History:  Procedure Laterality Frank   c section     COLONOSCOPY  09/29/2019   per Dr. Ardis Hughs, cancerous rectal mass     HEMORROIDECTOMY     IR IMAGING GUIDED PORT INSERTION  10/13/2019   IR PARACENTESIS  01/18/2021   IR PARACENTESIS  01/24/2021   IR PARACENTESIS  03/07/2021   IR US GUIDE BX ASP/DRAIN  10/13/2019   POLYPECTOMY      Social History  reports that she has never smoked. She  has never used smokeless tobacco. She reports that she does not drink alcohol and does not use drugs.  No Known Allergies  Family History  Problem Relation Age of Onset   Hypertension Other    Cancer Other        lung, prostate   Colon polyps Father    Colon cancer Neg Hx    Esophageal cancer Neg Hx    Rectal cancer Neg Hx    Stomach cancer Neg Hx      Prior to Admission medications   Medication Sig Start Frank End Frank Taking? Authorizing Provider  amLODipine (NORVASC) 5 MG tablet Take 1 tablet (5 mg total) by mouth daily. 09/29/20  Yes [name], MD  atenolol (TENORMIN) 50 MG tablet TAKE ONE TABLET BY MOUTH EACH DAY 09/29/20  Yes [name], MD  diphenoxylate-atropine (LOMOTIL) 2.5-0.025 MG tablet Take 1 tablet by mouth 4 (four) times daily as needed for diarrhea or loose stools. 04/04/21  Yes Ladell Pier, MD  Ferrous Sulfate (IRON PO) Take 1 tablet by mouth daily.   Yes [provider]  hydrochlorothiazide (HYDRODIURIL) 25 MG tablet Take 1 tablet (25 mg total) by mouth daily. 09/29/20  Yes [name], MD  lidocaine-prilocaine (EMLA) cream Apply 1 application topically as needed. Patient taking differently: Apply 1 application topically as needed (acces port). 03/21/21  Yes Owens Shark, NP  loperamide (IMODIUM) 2 MG capsule Take 2-4 mg by mouth as needed for diarrhea or loose stools.   Yes [provider]  magic mouthwash (nystatin, diphenhydrAMINE, alum & mag hydroxide) suspension mixture Swish and spit 5 mLs by mouth 4 (four) times daily as needed for mouth pain. 03/07/21  Yes Ladell Pier, MD  magnesium oxide (MAG-OX) 400 (241.3 Mg) MG tablet Take 1 tablet (400 mg total) by mouth 2 (two) times daily. 11/25/19  Yes Owens Shark, NP  prochlorperazine (COMPAZINE) 10 MG tablet Take 1 tablet (10 mg total) by mouth every 6 (six) hours as needed for nausea or vomiting. 02/20/21  Yes Owens Shark, NP  trifluridine-tipiracil (LONSURF) 20-8.19 MG tablet  Take 6 tablets (120 mg of trifluridine total) by mouth 2 (two) times daily after a meal. 1 hr after AM & PM meals on days 1-5, 8-12. Repeat every 28day 03/12/21  Yes Ladell Pier, MD  azithromycin (ZITHROMAX Z-PAK) 250 MG tablet As directed 03/30/21   [name], MD  ondansetron (ZOFRAN) 8 MG tablet Take 1 tablet (8 mg total) by mouth every 8 (eight) hours as needed for nausea or vomiting. Start 72 hours after each treatment 11/11/19   Ladell Pier, MD  oxyCODONE (ROXICODONE) 5 MG immediate release tablet Take 1 tablet (5 mg total) by mouth every 6 (six) hours as needed for severe pain. Patient not taking: Reported on 04/07/2021 02/06/21   Ladell Pier, MD  Promethazine-Codeine 6.25-10 MG/5ML SOLN Take 5 mLs by mouth every 8 (eight)  hours as needed. Patient not taking: Reported on 04/07/2021 09/05/20   Ladell Pier, MD  spironolactone (ALDACTONE) 25 MG tablet Take 1 tablet (25 mg total) by mouth 2 (two) times daily. Patient not taking: No sig reported 03/21/21   Owens Shark, NP    Physical Exam: Vitals:   04/07/21 1918 04/07/21 2015 04/07/21 2213 04/07/21 2214  BP: (!) 152/97 (!) 121/91 139/87   Pulse: (!) 106 (!) 106 (!) 110   Resp: (!) $RemoveB'22 19 18   'UPXrPgsA$ Temp:   97.9 F (36.6 C)   TempSrc:   Oral   SpO2: 100% 100% 100%   Weight:    74.7 kg  Height:    '5\' 2"'$  (1.575 m)    Constitutional: NAD, calm, comfortable Vitals:   04/07/21 1918 04/07/21 2015 04/07/21 2213 04/07/21 2214  BP: (!) 152/97 (!) 121/91 139/87   Pulse: (!) 106 (!) 106 (!) 110   Resp: (!) $RemoveB'22 19 18   'FwdXFXqu$ Temp:   97.9 F (36.6 C)   TempSrc:   Oral   SpO2: 100% 100% 100%   Weight:    74.7 kg  Height:    '5\' 2"'$  (1.575 m)   General: WDWN, Alert and oriented x3.  Eyes: EOMI, PERRL, conjunctivae normal.  Sclera nonicteric HENT:  [place]/AT, external ears normal.  Nares patent without epistasis.  Mucous membranes are moist. Posterior pharynx clear  Neck: Soft, normal range of motion, supple, no masses, Trachea  midline Respiratory: clear to auscultation bilaterally, no wheezing, no crackles. Normal respiratory effort. No accessory muscle use.  Cardiovascular: Regular rhythm, tachycardia. no murmurs / rubs / gallops. Has lower extremity edema left leg more than right. 1+ pedal pulses.  Abdomen: Soft, no tenderness, nondistended, no rebound or guarding.  No masses palpated. No hepatosplenomegaly. Bowel sounds normoactive Musculoskeletal: FROM. Left calf and distal thigh tender to palpation. no cyanosis. No joint deformity upper and lower extremities. Normal muscle tone. Positive Homan's sign on left.  Skin: Warm, dry, intact no rashes, lesions, ulcers. No induration Neurologic: CN 2-12 grossly intact.  Normal speech. Strength 5/5 in all extremities.   Psychiatric: Normal judgment and insight.  Normal mood.    Labs on Admission: I have personally reviewed following labs and imaging studies  CBC: Recent Labs  Lab 04/04/21 0939 04/07/21 1640  WBC 3.6* 4.0  NEUTROABS 1.4* 1.7  HGB 10.6* 10.5*  HCT 33.1* 32.3*  MCV 91.7 90.0  PLT 252 144    Basic Metabolic Panel: Recent Labs  Lab 04/04/21 0939 04/07/21 1640  NA 127* 124*  K 4.6 4.4  CL 95* 94*  CO2 20* 22  GLUCOSE 130* 100*  BUN 40* 30*  CREATININE 1.49* 0.90  CALCIUM 8.3* 7.8*    GFR: Estimated Creatinine Clearance: 61.3 mL/min (by C-G formula based on SCr of 0.9 mg/dL).  Liver Function Tests: Recent Labs  Lab 04/04/21 0939 04/07/21 1640  AST 47* 40  ALT 13 11  ALKPHOS 774* 619*  BILITOT 3.6* 2.6*  PROT 6.1* 5.7*  ALBUMIN 2.2* 2.0*    Urine analysis:    Component Value Frank/Time   COLORURINE YELLOW 09/05/2020 0805   APPEARANCEUR CLEAR 09/05/2020 0805   LABSPEC 1.016 09/05/2020 0805   PHURINE 7.0 09/05/2020 0805   GLUCOSEU NEGATIVE 09/05/2020 0805   HGBUR NEGATIVE 09/05/2020 0805   HGBUR negative 03/29/2009 0829   BILIRUBINUR NEGATIVE 09/05/2020 0805   BILIRUBINUR n 10/21/2017 1409   KETONESUR NEGATIVE 09/05/2020  0805   PROTEINUR NEGATIVE 03/21/2021 1203  UROBILINOGEN 1.0 10/21/2017 1409   UROBILINOGEN 0.2 03/29/2009 0829   NITRITE NEGATIVE 09/05/2020 0805   LEUKOCYTESUR NEGATIVE 09/05/2020 0805    Radiological Exams on Admission: CT Angio Chest PE W and/or Wo Contrast  Result Frank: 04/07/2021 CLINICAL DATA:  Pt c/o swelling to left leg and calf onset yesterday. Pt reports that she sits for long periods of time due to her legs being swollen. Pt is not able to ambulate much due to stage 4 colon cancer. hAs a dvt EXAM: CT ANGIOGRAPHY CHEST CT ABDOMEN AND PELVIS WITH CONTRAST TECHNIQUE: Multidetector CT imaging of the chest was performed using the standard protocol during bolus administration of intravenous contrast. Multiplanar CT image reconstructions and MIPs were obtained to evaluate the vascular anatomy. Multidetector CT imaging of the abdomen and pelvis was performed using the standard protocol during bolus administration of intravenous contrast. CONTRAST:  68mL OMNIPAQUE IOHEXOL 350 MG/ML SOLN COMPARISON:  CT abdomen pelvis 03/02/2021 FINDINGS: CTA CHEST FINDINGS Lines and tubes: Right chest wall Port-A-Cath with tip terminating at the inferior cavoatrial junction. Cardiovascular: Satisfactory opacification of the pulmonary arteries to the segmental level. Bilateral lower lobe segmental and subsegmental pulmonary embolus. No central pulmonary embolus. Normal heart size. No pericardial effusion. Mediastinum/Nodes: No enlarged mediastinal, hilar, or axillary lymph nodes. Thyroid gland, trachea, and esophagus demonstrate no significant findings. Lungs/Pleura: 0.4 cm subpleural right lower lobe pulmonary nodule (4:53). Micronodule subpleural right lower lobe (4:63). Right upper lobe subpleural nodule along the major fissure (4:62). Several other scattered pulmonary micronodules bilaterally. Musculoskeletal: No chest wall abnormality. No suspicious lytic or blastic osseous lesions. No acute displaced fracture.  Multilevel degenerative changes of the spine. Review of the MIP images confirms the above findings. CT ABDOMEN and PELVIS FINDINGS Hepatobiliary: Redemonstration of innumerable heterogeneous hypodense hepatic mass lesions replacing the majority of the hepatic parenchyma consistent with hepatic metastases. Pancreas: No focal lesion. Normal pancreatic contour. No surrounding inflammatory changes. No main pancreatic ductal dilatation. Spleen: Normal in size without focal abnormality. Adrenals/Urinary Tract: No adrenal nodule bilaterally. Bilateral kidneys enhance symmetrically. No hydronephrosis. No hydroureter. Linear calcification within the right kidney measuring up to 6 mm. Similar-appearing hypodense lesion within left kidney measuring up to 1.2 cm with a density of 32 Hounsfield units. The urinary bladder is unremarkable. Stomach/Bowel: Stomach is within normal limits. No evidence of bowel wall thickening or dilatation. Appendix appears normal. Vascular/Lymphatic: Redemonstration of a known nonocclusive left common femoral deep venous thrombosis extending into deep and femoral veins. The thrombus originates at the origin of the common femoral vein from the external iliac artery. No iliac deep venous thrombosis. The main portal, splenic, superior mesenteric veins are patent. No abdominal aorta or iliac aneurysm. At least moderate atherosclerotic plaque of the aorta and its branches. No abdominal, pelvic, or inguinal lymphadenopathy. Reproductive: Coarsely calcified uterine lesion grossly similar in size and appearance likely represents a uterine fibroid. Otherwise uterus and bilateral adnexa are unremarkable. Other: Possible slightly decreased small volume simple fluid ascites. No intraperitoneal free gas. No organized fluid collection. Musculoskeletal: Diffuse moderate subcutaneus soft tissue edema. No abdominal wall hernia or abnormality. No suspicious lytic or blastic osseous lesions. No acute displaced  fracture. M Review of the MIP images confirms the above findings. IMPRESSION: 1. Bilateral lower lobe segmental and subsegmental pulmonary emboli. No associated right heart strain or pulmonary infarction. 2. Known nonocclusive left common femoral deep venous thrombosis extending into the deep and femoral veins. 3. Innumerable hepatic metastases. 4. Possibly slightly decreased small volume simple free fluid ascites. 5.  Right chest wall Port-A-Cath with tip terminating at the inferior cavoatrial junction. Other imaging findings of potential clinical significance: 1. Scattered pulmonary micronodules bilaterally. 2. Indeterminate stable in size 1.2 cm left renal lesion. Recommend nonemergent MRI renal protocol for further evaluation. When the patient is clinically stable and able to follow directions and hold their breath (preferably as an outpatient) further evaluation with dedicated abdominal MRI should be considered. 3. Degenerative uterine fibroid. These results were called by telephone at the time of interpretation on 04/07/2021 at 7:21 pm to provider [facility] , who verbally acknowledged these results. Electronically Signed   By: Iven Finn M.D.   On: 04/07/2021 19:27   CT ABDOMEN PELVIS W CONTRAST  Result Frank: 04/07/2021 CLINICAL DATA:  Pt c/o swelling to left leg and calf onset yesterday. Pt reports that she sits for long periods of time due to her legs being swollen. Pt is not able to ambulate much due to stage 4 colon cancer. hAs a dvt EXAM: CT ANGIOGRAPHY CHEST CT ABDOMEN AND PELVIS WITH CONTRAST TECHNIQUE: Multidetector CT imaging of the chest was performed using the standard protocol during bolus administration of intravenous contrast. Multiplanar CT image reconstructions and MIPs were obtained to evaluate the vascular anatomy. Multidetector CT imaging of the abdomen and pelvis was performed using the standard protocol during bolus administration of intravenous contrast. CONTRAST:  31mL  OMNIPAQUE IOHEXOL 350 MG/ML SOLN COMPARISON:  CT abdomen pelvis 03/02/2021 FINDINGS: CTA CHEST FINDINGS Lines and tubes: Right chest wall Port-A-Cath with tip terminating at the inferior cavoatrial junction. Cardiovascular: Satisfactory opacification of the pulmonary arteries to the segmental level. Bilateral lower lobe segmental and subsegmental pulmonary embolus. No central pulmonary embolus. Normal heart size. No pericardial effusion. Mediastinum/Nodes: No enlarged mediastinal, hilar, or axillary lymph nodes. Thyroid gland, trachea, and esophagus demonstrate no significant findings. Lungs/Pleura: 0.4 cm subpleural right lower lobe pulmonary nodule (4:53). Micronodule subpleural right lower lobe (4:63). Right upper lobe subpleural nodule along the major fissure (4:62). Several other scattered pulmonary micronodules bilaterally. Musculoskeletal: No chest wall abnormality. No suspicious lytic or blastic osseous lesions. No acute displaced fracture. Multilevel degenerative changes of the spine. Review of the MIP images confirms the above findings. CT ABDOMEN and PELVIS FINDINGS Hepatobiliary: Redemonstration of innumerable heterogeneous hypodense hepatic mass lesions replacing the majority of the hepatic parenchyma consistent with hepatic metastases. Pancreas: No focal lesion. Normal pancreatic contour. No surrounding inflammatory changes. No main pancreatic ductal dilatation. Spleen: Normal in size without focal abnormality. Adrenals/Urinary Tract: No adrenal nodule bilaterally. Bilateral kidneys enhance symmetrically. No hydronephrosis. No hydroureter. Linear calcification within the right kidney measuring up to 6 mm. Similar-appearing hypodense lesion within left kidney measuring up to 1.2 cm with a density of 32 Hounsfield units. The urinary bladder is unremarkable. Stomach/Bowel: Stomach is within normal limits. No evidence of bowel wall thickening or dilatation. Appendix appears normal. Vascular/Lymphatic:  Redemonstration of a known nonocclusive left common femoral deep venous thrombosis extending into deep and femoral veins. The thrombus originates at the origin of the common femoral vein from the external iliac artery. No iliac deep venous thrombosis. The main portal, splenic, superior mesenteric veins are patent. No abdominal aorta or iliac aneurysm. At least moderate atherosclerotic plaque of the aorta and its branches. No abdominal, pelvic, or inguinal lymphadenopathy. Reproductive: Coarsely calcified uterine lesion grossly similar in size and appearance likely represents a uterine fibroid. Otherwise uterus and bilateral adnexa are unremarkable. Other: Possible slightly decreased small volume simple fluid ascites. No intraperitoneal free gas. No organized fluid  collection. Musculoskeletal: Diffuse moderate subcutaneus soft tissue edema. No abdominal wall hernia or abnormality. No suspicious lytic or blastic osseous lesions. No acute displaced fracture. M Review of the MIP images confirms the above findings. IMPRESSION: 1. Bilateral lower lobe segmental and subsegmental pulmonary emboli. No associated right heart strain or pulmonary infarction. 2. Known nonocclusive left common femoral deep venous thrombosis extending into the deep and femoral veins. 3. Innumerable hepatic metastases. 4. Possibly slightly decreased small volume simple free fluid ascites. 5. Right chest wall Port-A-Cath with tip terminating at the inferior cavoatrial junction. Other imaging findings of potential clinical significance: 1. Scattered pulmonary micronodules bilaterally. 2. Indeterminate stable in size 1.2 cm left renal lesion. Recommend nonemergent MRI renal protocol for further evaluation. When the patient is clinically stable and able to follow directions and hold their breath (preferably as an outpatient) further evaluation with dedicated abdominal MRI should be considered. 3. Degenerative uterine fibroid. These results were called  by telephone at the time of interpretation on 04/07/2021 at 7:21 pm to provider [facility] , who verbally acknowledged these results. Electronically Signed   By: Iven Finn M.D.   On: 04/07/2021 19:27   US Venous Img Lower Unilateral Left  Result Frank: 04/07/2021 CLINICAL DATA:  Left calf pain and swelling EXAM: Left LOWER EXTREMITY VENOUS DOPPLER ULTRASOUND TECHNIQUE: Gray-scale sonography with graded compression, as well as color Doppler and duplex ultrasound were performed to evaluate the lower extremity deep venous systems from the level of the common femoral vein and including the common femoral, femoral, profunda femoral, popliteal and calf veins including the posterior tibial, peroneal and gastrocnemius veins when visible. The superficial great saphenous vein was also interrogated. Spectral Doppler was utilized to evaluate flow at rest and with distal augmentation maneuvers in the common femoral, femoral and popliteal veins. COMPARISON:  None. FINDINGS: Contralateral Common Femoral Vein: Respiratory phasicity is normal and symmetric with the symptomatic side. No evidence of thrombus. Normal compressibility. Common Femoral Vein: Occlusive thrombus within the common femoral vein. Vein is noncompressible. Saphenofemoral Junction: Positive for occlusive thrombus. Vein is noncompressible Profunda Femoral Vein: Positive for near occlusive thrombus with some flow present. Partially compressible. Femoral Vein: Positive for occlusive thrombus. Vein is noncompressible Popliteal Vein: Positive for occlusive thrombus. Vein is noncompressible. Calf Veins: Positive for occlusive thrombus within the posterior tibial and peroneal veins. IMPRESSION: Positive for acute extensive occlusive thrombus extending from left common femoral vein to the calf veins. Critical Value/emergent results were called by telephone at the time of interpretation on 04/07/2021 at 5:51 pm to provider Davonna Belling , who verbally  acknowledged these results. Electronically Signed   By: Donavan Foil M.D.   On: 04/07/2021 17:51   DG Chest Portable 1 View  Result Frank: 04/07/2021 CLINICAL DATA:  Chest pain EXAM: PORTABLE CHEST 1 VIEW COMPARISON:  03/30/2021 FINDINGS: Right-sided chest port is unchanged in positioning. Normal heart size. Low lung volumes. No focal airspace consolidation, pleural effusion, or pneumothorax. IMPRESSION: Low lung volumes.  No acute cardiopulmonary findings. Electronically Signed   By: Davina Poke D.O.   On: 04/07/2021 18:11    EKG: Independently reviewed.  EKG shows sinus tachycardia with nonspecific lateral ST changes but no acute ST elevation or depression.  QTc prolonged at 491  Assessment/Plan Principal Problem:   Acute pulmonary embolism without acute cor pulmonale  Ms. Mandala is admitted to telemetry floor.  She initially was placed on a heparin infusion in the emergency room.  As patient has no signs of right heart  failure and is not going to be receiving tPA will change to Eliquis for therapeutic anticoagulation.  Discussed this plan with the pharmacist who agrees. Multiple studies have shown that DOAC's can be used for initial therapy of PE and DVTs and no right heart strain or use of tPA. Discussed this plan with patient and she is in agreement and willing to use Eliquis. Obtain echocardiogram in the morning to evaluate wall motion, valvular function and EF  Active Problems:   Acute deep vein thrombosis (DVT) of femoral vein of left lower extremity  Patient started on Eliquis for therapeutic anticoagulation    Hyponatremia Sodium level 124 in the emergency room.  We will recheck stat sodium level now.  Patient was given IV fluids in the emergency room.  We will monitor sodium level closely to make sure it does not rise too rapidly. IV fluid hydration provided at 75 ml/hr overnight.  Recheck BMP at 5 AM    Essential hypertension Norvasc 5 mg a day.  Monitor blood pressure     Prolonged QT interval Avoid medications that could further prolong QT interval.  Monitor on telemetry    Colorectal cancer, stage IV Followed by oncology   DVT prophylaxis: Started on Eliquis for therapeutic anticoagulation.  Code Status:   Full Code  Family Communication:  Diagnosis and plan discussed with patient.  Patient verbalized understanding agrees with plan.  Further recommendations to follow as clinical indicated Disposition Plan:   Patient is from:  Home  Anticipated DC to:  Home  Anticipated DC Frank:  Anticipate 2 midnight or more stay to treat acute condition  Admission status:  Inpatient   Yevonne Aline Maron Stanzione MD [facility]  How to contact the [facility] Attending or Consulting provider [place] or covering provider during after hours [place], for this patient?   Check the care team in [facility] and look for a) attending/consulting [facility] provider listed and b) the [facility] team listed Log into [email or web address] and use [facility] universal password to access. If you do not have the password, please contact the hospital operator. Locate the [facility] provider you are looking for under [facility] and page to a number that you can be directly reached. If you still have difficulty reaching the provider, please page the [facility] (Director on Call) for the Hospitalists listed on amion for assistance.  04/07/2021, 11:50 PM

## 2021-04-07 NOTE — Progress Notes (Signed)
ANTICOAGULATION CONSULT NOTE - Initial Consult  Pharmacy Consult for Heparin Indication: pulmonary embolus  No Known Allergies  Patient Measurements: Height: 5' 2" (157.5 cm) Weight: 76 kg (167 lb 9.6 oz) IBW/kg (Calculated) : 50.1 Heparin Dosing Weight: 66.6 kg  Vital Signs: Temp: 97.8 F (36.6 C) (09/24 1449) BP: 128/89 (09/24 1800) Pulse Rate: 103 (09/24 1837)  Labs: Recent Labs    04/07/21 1640  HGB 10.5*  HCT 32.3*  PLT 243  CREATININE 0.90  TROPONINIHS 16    Estimated Creatinine Clearance: 61.9 mL/min (by C-G formula based on SCr of 0.9 mg/dL).   Medical History: Past Medical History:  Diagnosis Date   Anemia    iron deficiency   Gynecological examination    [facility]   Heart murmur    Hyperlipidemia    on simvastatin   Hypertension    met colon ca to liver dx'd 10/2018    Medications:  (Not in a hospital admission)  Scheduled:  Infusions:  PRN:   Assessment: 40 yof presenting with leg swelling. Heparin per pharmacy consult placed for pulmonary embolus.  Doppler with extensive clot on left leg. CT chest with bilateral pulmonary embolism.  Patient is on not on anticoagulation prior to arrival.  Hgb10.5;plt 243  Goal of Therapy:  Heparin level 0.3-0.7 units/ml Monitor platelets by anticoagulation protocol: Yes   Plan:  Give 5000 units bolus x 1 Start heparin infusion at 1300 units/hr Check anti-Xa level in 6-8 hours and daily while on heparin Continue to monitor H&H and platelets  Lorelei Pont, PharmD, BCPS 04/07/2021 7:04 PM ED Clinical Pharmacist -  [phone]

## 2021-04-07 NOTE — ED Notes (Signed)
Report given to Lauren, RN.

## 2021-04-07 NOTE — ED Provider Notes (Signed)
[facility] EMERGENCY DEPT Provider Note   CSN: [id number] Arrival date & time: 04/07/21  1442     History Chief Complaint  Patient presents with   Leg Swelling    Rachel Frank is a 60 y.o. female.  HPI Patient has stage IV colon cancer.  Has some chronic swelling of both her legs but states there is more swelling in her left leg and calf than normal.  More pain in the calf than baseline also.  No fevers.  Recently had another paracentesis.  States she has some right sided chest pain when she tries to breathe.  It is in the lower chest near her liver however.  Does have some chronic pain in her liver due to metastatic disease.  Does not feel short of breath.  No trauma.    Past Medical History:  Diagnosis Date   Anemia    iron deficiency   Gynecological examination    [facility]   Heart murmur    Hyperlipidemia    on simvastatin   Hypertension    met colon ca to liver dx'd 10/2018    Patient Active Problem List   Diagnosis Date Noted   Colorectal cancer, stage IV ([place]) 10/11/2019   Goals of care, counseling/discussion 10/11/2019   Iron deficiency anemia 09/01/2019   Adenomatous polyp of cecum 09/01/2019   Anemia 08/20/2019   Hyperlipidemia 03/27/2009   Essential hypertension 01/12/2007   HEART MURMUR, HX OF 01/12/2007    Past Surgical History:  Procedure Laterality Date   c section     COLONOSCOPY  09/29/2019   per Dr. Ardis Hughs, cancerous rectal mass     HEMORROIDECTOMY     IR IMAGING GUIDED PORT INSERTION  10/13/2019   IR PARACENTESIS  01/18/2021   IR PARACENTESIS  01/24/2021   IR PARACENTESIS  03/07/2021   IR US GUIDE BX ASP/DRAIN  10/13/2019   POLYPECTOMY       OB History   No obstetric history on file.     Family History  Problem Relation Age of Onset   Hypertension Other    Cancer Other        lung, prostate   Colon polyps Father    Colon cancer Neg Hx    Esophageal cancer Neg Hx    Rectal cancer Neg Hx     Stomach cancer Neg Hx     Social History   Tobacco Use   Smoking status: Never   Smokeless tobacco: Never  Vaping Use   Vaping Use: Never used  Substance Use Topics   Alcohol use: No    Alcohol/week: 0.0 standard drinks   Drug use: No    Home Medications Prior to Admission medications   Medication Sig Start Date End Date Taking? Authorizing Provider  amLODipine (NORVASC) 5 MG tablet Take 1 tablet (5 mg total) by mouth daily. 09/29/20  Yes [name], MD  atenolol (TENORMIN) 50 MG tablet TAKE ONE TABLET BY MOUTH EACH DAY 09/29/20  Yes [name], MD  diphenoxylate-atropine (LOMOTIL) 2.5-0.025 MG tablet Take 1 tablet by mouth 4 (four) times daily as needed for diarrhea or loose stools. 04/04/21  Yes Ladell Pier, MD  hydrochlorothiazide (HYDRODIURIL) 25 MG tablet Take 1 tablet (25 mg total) by mouth daily. 09/29/20  Yes [name], MD  magnesium oxide (MAG-OX) 400 (241.3 Mg) MG tablet Take 1 tablet (400 mg total) by mouth 2 (two) times daily. 11/25/19  Yes Owens Shark, NP  trifluridine-tipiracil (LONSURF) 20-8.19 MG tablet Take 6 tablets (120 mg of trifluridine total) by mouth 2 (two) times daily after a meal. 1 hr after AM & PM meals on days 1-5, 8-12. Repeat every 28day 03/12/21  Yes Ladell Pier, MD  azithromycin (ZITHROMAX Z-PAK) 250 MG tablet As directed 03/30/21   [name], MD  lidocaine-prilocaine (EMLA) cream Apply 1 application topically as needed. 03/21/21   Owens Shark, NP  loperamide (IMODIUM) 2 MG capsule Take 2-4 mg by mouth as needed for diarrhea or loose stools.    [provider]  magic mouthwash (nystatin, diphenhydrAMINE, alum & mag hydroxide) suspension mixture Swish and spit 5 mLs by mouth 4 (four) times daily as needed for mouth pain. 03/07/21   Ladell Pier, MD  ondansetron (ZOFRAN) 8 MG tablet Take 1 tablet (8 mg total) by mouth every 8 (eight) hours as needed for nausea or vomiting. Start 72 hours after each treatment 11/11/19    Ladell Pier, MD  oxyCODONE (ROXICODONE) 5 MG immediate release tablet Take 1 tablet (5 mg total) by mouth every 6 (six) hours as needed for severe pain. 02/06/21   Ladell Pier, MD  prochlorperazine (COMPAZINE) 10 MG tablet Take 1 tablet (10 mg total) by mouth every 6 (six) hours as needed for nausea or vomiting. 02/20/21   Owens Shark, NP  Promethazine-Codeine 6.25-10 MG/5ML SOLN Take 5 mLs by mouth every 8 (eight) hours as needed. 09/05/20   Ladell Pier, MD  spironolactone (ALDACTONE) 25 MG tablet Take 1 tablet (25 mg total) by mouth 2 (two) times daily. Patient not taking: No sig reported 03/21/21   Owens Shark, NP    Allergies    Patient has no known allergies.  Review of Systems   Review of Systems  Constitutional:  Negative for appetite change.  HENT:  Negative for congestion.   Respiratory:  Negative for cough.   Cardiovascular:  Positive for chest pain and leg swelling.  Gastrointestinal:  Positive for abdominal pain.  Genitourinary:  Negative for flank pain.  Musculoskeletal:  Negative for back pain.  Skin:  Negative for rash.  Neurological:  Negative for weakness.  Psychiatric/Behavioral:  Negative for confusion.    Physical Exam Updated Vital Signs BP (!) 152/97   Pulse (!) 106   Temp 97.8 F (36.6 C)   Resp (!) 22   Ht _0  (1.575 m)   Wt 76 kg   SpO2 100%   BMI 30.65 kg/m   Physical Exam Vitals and nursing note reviewed.  HENT:     Head: Atraumatic.  Eyes:     Pupils: Pupils are equal, round, and reactive to light.  Cardiovascular:     Rate and Rhythm: Tachycardia present.  Pulmonary:     Breath sounds: No wheezing or rhonchi.  Abdominal:     Comments: Right upper quadrant tenderness out rebound or guarding.  Musculoskeletal:     Cervical back: Neck supple.     Comments: Edema bilateral lower extremities but may be worse on the left.  Some pain over left calf.  No induration.  No erythema.  Skin:    General: Skin is warm.     Capillary  Refill: Capillary refill takes less than 2 seconds.  Neurological:     Mental Status: She is alert and oriented to person, place, and time.  Psychiatric:        Mood and Affect: Mood normal.    ED Results / Procedures /  Treatments   Labs (all labs ordered are listed, but only abnormal results are displayed) Labs Reviewed  COMPREHENSIVE METABOLIC PANEL - Abnormal; Notable for the following components:      Result Value   Sodium 124 (*)    Chloride 94 (*)    Glucose, Bld 100 (*)    BUN 30 (*)    Calcium 7.8 (*)    Total Protein 5.7 (*)    Albumin 2.0 (*)    Alkaline Phosphatase 619 (*)    Total Bilirubin 2.6 (*)    All other components within normal limits  CBC WITH DIFFERENTIAL/PLATELET - Abnormal; Notable for the following components:   RBC 3.59 (*)    Hemoglobin 10.5 (*)    HCT 32.3 (*)    RDW 21.4 (*)    Monocytes Absolute 1.2 (*)    All other components within normal limits  RESP PANEL BY RT-PCR (FLU A&B, COVID) ARPGX2  PROTIME-INR  HEPARIN LEVEL (UNFRACTIONATED)  TROPONIN I (HIGH SENSITIVITY)  TROPONIN I (HIGH SENSITIVITY)    EKG EKG Interpretation  Date/Time:  Saturday April 07 2021 17:26:58 EDT Ventricular Rate:  105 PR Interval:  133 QRS Duration: 89 QT Interval:  371 QTC Calculation: 491 R Axis:   41 Text Interpretation: Sinus tachycardia Probable left atrial enlargement Abnormal T, consider ischemia, diffuse leads No old tracing to compare Confirmed by Davonna Belling [phone]) on 04/07/2021 5:43:02 PM  Radiology CT Angio Chest PE W and/or Wo Contrast  Result Date: 04/07/2021 CLINICAL DATA:  Pt c/o swelling to left leg and calf onset yesterday. Pt reports that she sits for long periods of time due to her legs being swollen. Pt is not able to ambulate much due to stage 4 colon cancer. hAs a dvt EXAM: CT ANGIOGRAPHY CHEST CT ABDOMEN AND PELVIS WITH CONTRAST TECHNIQUE: Multidetector CT imaging of the chest was performed using the standard protocol during  bolus administration of intravenous contrast. Multiplanar CT image reconstructions and MIPs were obtained to evaluate the vascular anatomy. Multidetector CT imaging of the abdomen and pelvis was performed using the standard protocol during bolus administration of intravenous contrast. CONTRAST:  62m OMNIPAQUE IOHEXOL 350 MG/ML SOLN COMPARISON:  CT abdomen pelvis 03/02/2021 FINDINGS: CTA CHEST FINDINGS Lines and tubes: Right chest wall Port-A-Cath with tip terminating at the inferior cavoatrial junction. Cardiovascular: Satisfactory opacification of the pulmonary arteries to the segmental level. Bilateral lower lobe segmental and subsegmental pulmonary embolus. No central pulmonary embolus. Normal heart size. No pericardial effusion. Mediastinum/Nodes: No enlarged mediastinal, hilar, or axillary lymph nodes. Thyroid gland, trachea, and esophagus demonstrate no significant findings. Lungs/Pleura: 0.4 cm subpleural right lower lobe pulmonary nodule (4:53). Micronodule subpleural right lower lobe (4:63). Right upper lobe subpleural nodule along the major fissure (4:62). Several other scattered pulmonary micronodules bilaterally. Musculoskeletal: No chest wall abnormality. No suspicious lytic or blastic osseous lesions. No acute displaced fracture. Multilevel degenerative changes of the spine. Review of the MIP images confirms the above findings. CT ABDOMEN and PELVIS FINDINGS Hepatobiliary: Redemonstration of innumerable heterogeneous hypodense hepatic mass lesions replacing the majority of the hepatic parenchyma consistent with hepatic metastases. Pancreas: No focal lesion. Normal pancreatic contour. No surrounding inflammatory changes. No main pancreatic ductal dilatation. Spleen: Normal in size without focal abnormality. Adrenals/Urinary Tract: No adrenal nodule bilaterally. Bilateral kidneys enhance symmetrically. No hydronephrosis. No hydroureter. Linear calcification within the right kidney measuring up to 6 mm.  Similar-appearing hypodense lesion within left kidney measuring up to 1.2 cm with a density of 32 Hounsfield units. The  urinary bladder is unremarkable. Stomach/Bowel: Stomach is within normal limits. No evidence of bowel wall thickening or dilatation. Appendix appears normal. Vascular/Lymphatic: Redemonstration of a known nonocclusive left common femoral deep venous thrombosis extending into deep and femoral veins. The thrombus originates at the origin of the common femoral vein from the external iliac artery. No iliac deep venous thrombosis. The main portal, splenic, superior mesenteric veins are patent. No abdominal aorta or iliac aneurysm. At least moderate atherosclerotic plaque of the aorta and its branches. No abdominal, pelvic, or inguinal lymphadenopathy. Reproductive: Coarsely calcified uterine lesion grossly similar in size and appearance likely represents a uterine fibroid. Otherwise uterus and bilateral adnexa are unremarkable. Other: Possible slightly decreased small volume simple fluid ascites. No intraperitoneal free gas. No organized fluid collection. Musculoskeletal: Diffuse moderate subcutaneus soft tissue edema. No abdominal wall hernia or abnormality. No suspicious lytic or blastic osseous lesions. No acute displaced fracture. M Review of the MIP images confirms the above findings. IMPRESSION: 1. Bilateral lower lobe segmental and subsegmental pulmonary emboli. No associated right heart strain or pulmonary infarction. 2. Known nonocclusive left common femoral deep venous thrombosis extending into the deep and femoral veins. 3. Innumerable hepatic metastases. 4. Possibly slightly decreased small volume simple free fluid ascites. 5. Right chest wall Port-A-Cath with tip terminating at the inferior cavoatrial junction. Other imaging findings of potential clinical significance: 1. Scattered pulmonary micronodules bilaterally. 2. Indeterminate stable in size 1.2 cm left renal lesion. Recommend  nonemergent MRI renal protocol for further evaluation. When the patient is clinically stable and able to follow directions and hold their breath (preferably as an outpatient) further evaluation with dedicated abdominal MRI should be considered. 3. Degenerative uterine fibroid. These results were called by telephone at the time of interpretation on 04/07/2021 at 7:21 pm to provider [facility] , who verbally acknowledged these results. Electronically Signed   By: Iven Finn M.D.   On: 04/07/2021 19:27   CT ABDOMEN PELVIS W CONTRAST  Result Date: 04/07/2021 CLINICAL DATA:  Pt c/o swelling to left leg and calf onset yesterday. Pt reports that she sits for long periods of time due to her legs being swollen. Pt is not able to ambulate much due to stage 4 colon cancer. hAs a dvt EXAM: CT ANGIOGRAPHY CHEST CT ABDOMEN AND PELVIS WITH CONTRAST TECHNIQUE: Multidetector CT imaging of the chest was performed using the standard protocol during bolus administration of intravenous contrast. Multiplanar CT image reconstructions and MIPs were obtained to evaluate the vascular anatomy. Multidetector CT imaging of the abdomen and pelvis was performed using the standard protocol during bolus administration of intravenous contrast. CONTRAST:  7m OMNIPAQUE IOHEXOL 350 MG/ML SOLN COMPARISON:  CT abdomen pelvis 03/02/2021 FINDINGS: CTA CHEST FINDINGS Lines and tubes: Right chest wall Port-A-Cath with tip terminating at the inferior cavoatrial junction. Cardiovascular: Satisfactory opacification of the pulmonary arteries to the segmental level. Bilateral lower lobe segmental and subsegmental pulmonary embolus. No central pulmonary embolus. Normal heart size. No pericardial effusion. Mediastinum/Nodes: No enlarged mediastinal, hilar, or axillary lymph nodes. Thyroid gland, trachea, and esophagus demonstrate no significant findings. Lungs/Pleura: 0.4 cm subpleural right lower lobe pulmonary nodule (4:53). Micronodule subpleural  right lower lobe (4:63). Right upper lobe subpleural nodule along the major fissure (4:62). Several other scattered pulmonary micronodules bilaterally. Musculoskeletal: No chest wall abnormality. No suspicious lytic or blastic osseous lesions. No acute displaced fracture. Multilevel degenerative changes of the spine. Review of the MIP images confirms the above findings. CT ABDOMEN and PELVIS FINDINGS Hepatobiliary: Redemonstration of  innumerable heterogeneous hypodense hepatic mass lesions replacing the majority of the hepatic parenchyma consistent with hepatic metastases. Pancreas: No focal lesion. Normal pancreatic contour. No surrounding inflammatory changes. No main pancreatic ductal dilatation. Spleen: Normal in size without focal abnormality. Adrenals/Urinary Tract: No adrenal nodule bilaterally. Bilateral kidneys enhance symmetrically. No hydronephrosis. No hydroureter. Linear calcification within the right kidney measuring up to 6 mm. Similar-appearing hypodense lesion within left kidney measuring up to 1.2 cm with a density of 32 Hounsfield units. The urinary bladder is unremarkable. Stomach/Bowel: Stomach is within normal limits. No evidence of bowel wall thickening or dilatation. Appendix appears normal. Vascular/Lymphatic: Redemonstration of a known nonocclusive left common femoral deep venous thrombosis extending into deep and femoral veins. The thrombus originates at the origin of the common femoral vein from the external iliac artery. No iliac deep venous thrombosis. The main portal, splenic, superior mesenteric veins are patent. No abdominal aorta or iliac aneurysm. At least moderate atherosclerotic plaque of the aorta and its branches. No abdominal, pelvic, or inguinal lymphadenopathy. Reproductive: Coarsely calcified uterine lesion grossly similar in size and appearance likely represents a uterine fibroid. Otherwise uterus and bilateral adnexa are unremarkable. Other: Possible slightly decreased  small volume simple fluid ascites. No intraperitoneal free gas. No organized fluid collection. Musculoskeletal: Diffuse moderate subcutaneus soft tissue edema. No abdominal wall hernia or abnormality. No suspicious lytic or blastic osseous lesions. No acute displaced fracture. M Review of the MIP images confirms the above findings. IMPRESSION: 1. Bilateral lower lobe segmental and subsegmental pulmonary emboli. No associated right heart strain or pulmonary infarction. 2. Known nonocclusive left common femoral deep venous thrombosis extending into the deep and femoral veins. 3. Innumerable hepatic metastases. 4. Possibly slightly decreased small volume simple free fluid ascites. 5. Right chest wall Port-A-Cath with tip terminating at the inferior cavoatrial junction. Other imaging findings of potential clinical significance: 1. Scattered pulmonary micronodules bilaterally. 2. Indeterminate stable in size 1.2 cm left renal lesion. Recommend nonemergent MRI renal protocol for further evaluation. When the patient is clinically stable and able to follow directions and hold their breath (preferably as an outpatient) further evaluation with dedicated abdominal MRI should be considered. 3. Degenerative uterine fibroid. These results were called by telephone at the time of interpretation on 04/07/2021 at 7:21 pm to provider [facility] , who verbally acknowledged these results. Electronically Signed   By: Iven Finn M.D.   On: 04/07/2021 19:27   US Venous Img Lower Unilateral Left  Result Date: 04/07/2021 CLINICAL DATA:  Left calf pain and swelling EXAM: Left LOWER EXTREMITY VENOUS DOPPLER ULTRASOUND TECHNIQUE: Gray-scale sonography with graded compression, as well as color Doppler and duplex ultrasound were performed to evaluate the lower extremity deep venous systems from the level of the common femoral vein and including the common femoral, femoral, profunda femoral, popliteal and calf veins including the  posterior tibial, peroneal and gastrocnemius veins when visible. The superficial great saphenous vein was also interrogated. Spectral Doppler was utilized to evaluate flow at rest and with distal augmentation maneuvers in the common femoral, femoral and popliteal veins. COMPARISON:  None. FINDINGS: Contralateral Common Femoral Vein: Respiratory phasicity is normal and symmetric with the symptomatic side. No evidence of thrombus. Normal compressibility. Common Femoral Vein: Occlusive thrombus within the common femoral vein. Vein is noncompressible. Saphenofemoral Junction: Positive for occlusive thrombus. Vein is noncompressible Profunda Femoral Vein: Positive for near occlusive thrombus with some flow present. Partially compressible. Femoral Vein: Positive for occlusive thrombus. Vein is noncompressible Popliteal Vein: Positive for occlusive  thrombus. Vein is noncompressible. Calf Veins: Positive for occlusive thrombus within the posterior tibial and peroneal veins. IMPRESSION: Positive for acute extensive occlusive thrombus extending from left common femoral vein to the calf veins. Critical Value/emergent results were called by telephone at the time of interpretation on 04/07/2021 at 5:51 pm to provider Davonna Belling , who verbally acknowledged these results. Electronically Signed   By: Donavan Foil M.D.   On: 04/07/2021 17:51   DG Chest Portable 1 View  Result Date: 04/07/2021 CLINICAL DATA:  Chest pain EXAM: PORTABLE CHEST 1 VIEW COMPARISON:  03/30/2021 FINDINGS: Right-sided chest port is unchanged in positioning. Normal heart size. Low lung volumes. No focal airspace consolidation, pleural effusion, or pneumothorax. IMPRESSION: Low lung volumes.  No acute cardiopulmonary findings. Electronically Signed   By: Davina Poke D.O.   On: 04/07/2021 18:11    Procedures Procedures   Medications Ordered in ED Medications  heparin ADULT infusion 100 units/mL (25000 units/278m) (1,300 Units/hr  Intravenous New Bag/Given 04/07/21 1914)  iohexol (OMNIPAQUE) 350 MG/ML injection 80 mL (80 mLs Intravenous Contrast Given 04/07/21 1841)  heparin bolus via infusion 5,000 Units (5,000 Units Intravenous Bolus from Bag 04/07/21 1917)    ED Course  I have reviewed the triage vital signs and the nursing notes.  Pertinent labs & imaging results that were available during my care of the patient were reviewed by me and considered in my medical decision making (see chart for details).    MDM Rules/Calculators/A&P                           Patient presented with swelling in her left leg.  History of stage IV cancer.  Has had ascites intensive swelling in both her legs but now has had more swelling the left leg.  Doppler done and found extensive clot on the left side.  Cannot necessarily see proximal aspect.  Was mildly but persistently tachycardic.  CT scan done of the chest and does show bilateral lower lobe pulmonary embolisms.  No right heart strain seen.  Also it appears on the CT of the abdomen pelvis that the clot goes just just proximal of the femoral but not quite into the iliacs.  With extensive clot in the leg and the bilateral pulmonary embolisms along with the severe metastatic disease I feels the patient benefit from mission to the hospital.  I have discussed with Dr. DLorenso Courierwho is covering for Dr. SBenay Spiceand is aware of the patient.  [facility]  CRITICAL CARE Performed by: NDavonna BellingTotal critical care time: 30 minutes Critical care time was exclusive of separately billable procedures and treating other patients. Critical care was necessary to treat or prevent imminent or life-threatening deterioration. Critical care was time spent personally by me on the following activities: development of treatment plan with patient and/or surrogate as well as nursing, discussions with consultants, evaluation of patient's response to treatment, examination  of patient, obtaining history from patient or surrogate, ordering and performing treatments and interventions, ordering and review of laboratory studies, ordering and review of radiographic studies, pulse oximetry and re-evaluation of patient's condition.  Final Clinical Impression(s) / ED Diagnoses Final diagnoses:  Acute deep vein thrombosis (DVT) of femoral vein of left lower extremity ([place]  [place]olism) [place]    Rx / DC Orders ED Discharge Orders     None        PDavonna Belling  MD 04/07/21 1953

## 2021-04-07 NOTE — Discharge Instructions (Signed)
Information on my medicine - ELIQUIS (apixaban)  This medication education was reviewed with me or my healthcare representative as part of my discharge preparation.  T Why was Eliquis prescribed for you? Eliquis was prescribed to treat blood clots that may have been found in the veins of your legs (deep vein thrombosis) or in your lungs (pulmonary embolism) and to reduce the risk of them occurring again.  What do You need to know about Eliquis ? The starting dose is 10 mg (two 5 mg tablets) taken TWICE daily for the FIRST SEVEN (7) DAYS, then the dose is reduced to ONE 5 mg tablet taken TWICE daily.  Eliquis may be taken with or without food.   Try to take the dose about the same time in the morning and in the evening. If you have difficulty swallowing the tablet whole please discuss with your pharmacist how to take the medication safely.  Take Eliquis exactly as prescribed and DO NOT stop taking Eliquis without talking to the doctor who prescribed the medication.  Stopping may increase your risk of developing a new blood clot.  Refill your prescription before you run out.  After discharge, you should have regular check-up appointments with your healthcare provider that is prescribing your Eliquis.    What do you do if you miss a dose? If a dose of ELIQUIS is not taken at the scheduled time, take it as soon as possible on the same day and twice-daily administration should be resumed. The dose should not be doubled to make up for a missed dose.  Important Safety Information A possible side effect of Eliquis is bleeding. You should call your healthcare provider right away if you experience any of the following: Bleeding from an injury or your nose that does not stop. Unusual colored urine (red or dark brown) or unusual colored stools (red or black). Unusual bruising for unknown reasons. A serious fall or if you hit your head (even if there is no bleeding).  Some medicines may  interact with Eliquis and might increase your risk of bleeding or clotting while on Eliquis. To help avoid this, consult your healthcare provider or pharmacist prior to using any new prescription or non-prescription medications, including herbals, vitamins, non-steroidal anti-inflammatory drugs (NSAIDs) and supplements.  This website has more information on Eliquis (apixaban): http://[email or web address].[email or web address]/home

## 2021-04-08 ENCOUNTER — Inpatient Hospital Stay (HOSPITAL_COMMUNITY): Payer: 59

## 2021-04-08 DIAGNOSIS — I2602 Saddle embolus of pulmonary artery with acute cor pulmonale: Secondary | ICD-10-CM | POA: Diagnosis not present

## 2021-04-08 DIAGNOSIS — E871 Hypo-osmolality and hyponatremia: Secondary | ICD-10-CM

## 2021-04-08 LAB — ECHOCARDIOGRAM COMPLETE
Area-P 1/2: 2.93 cm2
Height: 62 in
MV M vel: 5.66 m/s
MV Peak grad: 128.1 mmHg
Radius: 0.3 cm
S' Lateral: 2.3 cm
Weight: 2634.94 oz

## 2021-04-08 LAB — CBC
HCT: 29.9 % — ABNORMAL LOW (ref 36.0–46.0)
Hemoglobin: 9.8 g/dL — ABNORMAL LOW (ref 12.0–15.0)
MCH: 29.9 pg (ref 26.0–34.0)
MCHC: 32.8 g/dL (ref 30.0–36.0)
MCV: 91.2 fL (ref 80.0–100.0)
Platelets: 243 10*3/uL (ref 150–400)
RBC: 3.28 MIL/uL — ABNORMAL LOW (ref 3.87–5.11)
RDW: 21.6 % — ABNORMAL HIGH (ref 11.5–15.5)
WBC: 4.8 10*3/uL (ref 4.0–10.5)
nRBC: 0 % (ref 0.0–0.2)

## 2021-04-08 LAB — BASIC METABOLIC PANEL
Anion gap: 10 (ref 5–15)
BUN: 30 mg/dL — ABNORMAL HIGH (ref 8–23)
CO2: 20 mmol/L — ABNORMAL LOW (ref 22–32)
Calcium: 7.6 mg/dL — ABNORMAL LOW (ref 8.9–10.3)
Chloride: 98 mmol/L (ref 98–111)
Creatinine, Ser: 1.04 mg/dL — ABNORMAL HIGH (ref 0.44–1.00)
GFR, Estimated: >60 mL/min (ref >60)
Glucose, Bld: 92 mg/dL (ref 70–99)
Potassium: 4.6 mmol/L (ref 3.5–5.1)
Sodium: 128 mmol/L — ABNORMAL LOW (ref 135–145)

## 2021-04-08 LAB — OSMOLALITY: Osmolality: 274 mOsm/kg — ABNORMAL LOW (ref 275–295)

## 2021-04-08 LAB — SODIUM, URINE, RANDOM: Sodium, Ur: <10 mmol/L

## 2021-04-08 LAB — HIV ANTIBODY (ROUTINE TESTING W REFLEX): HIV Screen 4th Generation wRfx: NONREACTIVE

## 2021-04-08 MED ORDER — MORPHINE SULFATE (PF) 2 MG/ML IV SOLN
2.0000 mg | Freq: Once | INTRAVENOUS | Status: AC
  Administered 2021-04-08: 2 mg via INTRAVENOUS
  Filled 2021-04-08: qty 1

## 2021-04-08 MED ORDER — GUAIFENESIN ER 600 MG PO TB12
600.0000 mg | ORAL_TABLET | Freq: Two times a day (BID) | ORAL | Status: DC | PRN
  Administered 2021-04-08: 600 mg via ORAL
  Filled 2021-04-08: qty 1

## 2021-04-08 MED ORDER — SODIUM CHLORIDE 0.9 % IV SOLN
INTRAVENOUS | Status: DC | PRN
  Administered 2021-04-08: 250 mL via INTRAVENOUS

## 2021-04-08 MED ORDER — OXYCODONE HCL 5 MG PO TABS
5.0000 mg | ORAL_TABLET | Freq: Four times a day (QID) | ORAL | Status: DC | PRN
  Administered 2021-04-08 – 2021-04-12 (×6): 5 mg via ORAL
  Filled 2021-04-08 (×6): qty 1

## 2021-04-08 NOTE — Progress Notes (Addendum)
PROGRESS NOTE    Rachel Frank   LZJ:[id number]  DOB: May 22, 1961  DOA: 04/07/2021 PCP: [name], MD   Brief Narrative:  Rachel Frank is a 60 y.o. female with medical history significant for Stage 4 colon cancer followed by Oncology, HTN, HLD who presents with swelling of her legs that is worse on the left than the right.  According to the patient and her son, she has steadily been losing her strength and is now having difficulty even walking to the bathroom.  She spends most of her day in bed.  She underwent a paracentesis last week which was therapeutic.  Her oncologist, Dr. Benay Spice told her last week that he would not be giving her any further chemotherapy.  The ED: Found to have a DVT of the left leg and bilateral PEs without any right heart strain.  Started on heparin infusion and admitted to the hospital.   Subjective: Today she states that her shortness of breath is a little bit better however, she has not attempted to ambulate.  She feels that if she tries to, she will not be able to ambulate.  No complaints of chest pain    Assessment & Plan:   Principal Problem:   Acute pulmonary embolism without acute cor pulmonale , left leg DVT in setting of metastatic cancer and severe deconditioning-generalized weakness Sinus tachycardia - According to the patient's son, she is mostly bedbound now -Lower extremity vascular ultrasound reveals occlusive thrombosis within the common femoral vein, saphenofemoral junction, profunda femoris, femoral vein, popliteal vein and posterior tibial and peroneal veins on the left -The patient states that she is symptomatically improved in regards to shortness of breath - Heparin has been switched to Eliquis - Continue to follow another 24 hours in the hospital for GI bleeding and also await palliative care consult --the patient states that she had bright red blood in her stool about a week ago she does have a history of hemorrhoids but has not  been constipated -Both patient and son understand to monitor stools and, if bleeding is noted, to stop Eliquis - I have had an extensive discussion with the patient and a subsequent discussion with the son outside of the room in regards to her prognosis-her son is agreeable to begin palliative conversations and thus I have consulted hospice/palliative care.  Active Problems:    Colorectal cancer, stage IV with abdominal ascites -Last CEA level was 45, 662 -She was due for chemotherapy last week however it was held by oncologist Dr. Benay Spice who also had a conversation with [facility] and her husband regarding her poor prognosis - She underwent a therapeutic paracentesis on 9/21, on the same day of her outpatient visit with oncology  -on exam today her abdomen is moderately distended - at this time I do not feel that there is a need for paracentesis -In addition to a paracentesis, Dr. Benay Spice recommended discontinuing HCTZ and spironolactone-he planned to follow-up with her on 04/10/2021 - She is receiving normal saline at 75 cc an hour which I will now discontinue  to prevent fluid overload    Hyponatremia -Related to third spacing and possibly poor oral intake of solute -Sodium noted to be 124 on/24-it is 128 today - It appears that her baseline is around 127-130 -She has moderate ascites today and 3+ pitting edema in bilateral lower extremities --we will DC IV fluids to prevent fluid overload  Essential hypertension - Continue amlodipine     Prolonged QT interval -QTC 491  on EKG on 9/24  Hypoalbuminemia, elevated alkaline phosphatase - Related to underlying cancer  Normocytic anemia - Hemoglobin ranges from 10-11      Time spent in minutes: 35 DVT prophylaxis:  apixaban (ELIQUIS) tablet 10 mg  apixaban (ELIQUIS) tablet 5 mg  Code Status: Full code Family Communication: Son at bedside Level of Care: Level of care: Telemetry Disposition Plan:  Status is:  Inpatient  Remains inpatient appropriate because:Inpatient level of care appropriate due to severity of illness  Dispo: The patient is from: Home              Anticipated d/c is to: Home              Patient currently is not medically stable to d/c.   Difficult to place patient No      Consultants:  None Procedures:  None Antimicrobials:  Anti-infectives (From admission, onward)    None        Objective: Vitals:   04/07/21 2214 04/08/21 0159 04/08/21 0631 04/08/21 0926  BP:  115/74 123/76 133/88  Pulse:  (!) 107 (!) 103 (!) 101  Resp:  16 18 18   Temp:  97.6 F (36.4 C) 97.9 F (36.6 C) 98.2 F (36.8 C)  TempSrc:  Oral Oral Oral  SpO2:  99% 100% 100%  Weight: 74.7 kg     Height: 5\' 2"  (1.575 m)       Intake/Output Summary (Last 24 hours) at 04/08/2021 1325 Last data filed at 04/08/2021 0404 Gross per 24 hour  Intake 211.49 ml  Output --  Net 211.49 ml   Filed Weights   04/07/21 1449 04/07/21 2214  Weight: 76 kg 74.7 kg    Examination: General exam: Appears comfortable  HEENT: PERRLA, oral mucosa moist, no sclera icterus or thrush Respiratory system: Clear to auscultation. Respiratory effort normal. Cardiovascular system: S1 & S2 heard, RRR.   Gastrointestinal system: Abdomen soft, non-tender, moderately distended. Normal bowel sounds. Central nervous system: Alert and oriented. No focal neurological deficits. Extremities: No cyanosis, clubbing -3+ pitting edema in both lower extremities Skin: No rashes or ulcers Psychiatry:  Mood & affect appropriate.     Data Reviewed: I have personally reviewed following labs and imaging studies  CBC: Recent Labs  Lab 04/04/21 0939 04/07/21 1640 04/08/21 0122  WBC 3.6* 4.0 4.8  NEUTROABS 1.4* 1.7  --   HGB 10.6* 10.5* 9.8*  HCT 33.1* 32.3* 29.9*  MCV 91.7 90.0 91.2  PLT 252 243 017   Basic Metabolic Panel: Recent Labs  Lab 04/04/21 0939 04/07/21 1640 04/08/21 0122  NA 127* 124* 128*  K 4.6 4.4  4.6  CL 95* 94* 98  CO2 20* 22 20*  GLUCOSE 130* 100* 92  BUN 40* 30* 30*  CREATININE 1.49* 0.90 1.04*  CALCIUM 8.3* 7.8* 7.6*   GFR: Estimated Creatinine Clearance: 53 mL/min (A) (by C-G formula based on SCr of 1.04 mg/dL (H)). Liver Function Tests: Recent Labs  Lab 04/04/21 0939 04/07/21 1640  AST 47* 40  ALT 13 11  ALKPHOS 774* 619*  BILITOT 3.6* 2.6*  PROT 6.1* 5.7*  ALBUMIN 2.2* 2.0*   No results for input(s): LIPASE, AMYLASE in the last 168 hours. No results for input(s): AMMONIA in the last 168 hours. Coagulation Profile: Recent Labs  Lab 04/07/21 2230  INR 1.9*   Cardiac Enzymes: No results for input(s): CKTOTAL, CKMB, CKMBINDEX, TROPONINI in the last 168 hours. BNP (last 3 results) No results for input(s): PROBNP in the last 8760  hours. HbA1C: No results for input(s): HGBA1C in the last 72 hours. CBG: No results for input(s): GLUCAP in the last 168 hours. Lipid Profile: No results for input(s): CHOL, HDL, LDLCALC, TRIG, CHOLHDL, LDLDIRECT in the last 72 hours. Thyroid Function Tests: No results for input(s): TSH, T4TOTAL, FREET4, T3FREE, THYROIDAB in the last 72 hours. Anemia Panel: No results for input(s): VITAMINB12, FOLATE, FERRITIN, TIBC, IRON, RETICCTPCT in the last 72 hours. Urine analysis:    Component Value Date/Time   COLORURINE YELLOW 09/05/2020 0805   APPEARANCEUR CLEAR 09/05/2020 0805   LABSPEC 1.016 09/05/2020 0805   PHURINE 7.0 09/05/2020 0805   GLUCOSEU NEGATIVE 09/05/2020 0805   HGBUR NEGATIVE 09/05/2020 0805   HGBUR negative 03/29/2009 0829   BILIRUBINUR NEGATIVE 09/05/2020 0805   BILIRUBINUR n 10/21/2017 1409   KETONESUR NEGATIVE 09/05/2020 0805   PROTEINUR NEGATIVE 03/21/2021 1203   UROBILINOGEN 1.0 10/21/2017 1409   UROBILINOGEN 0.2 03/29/2009 0829   NITRITE NEGATIVE 09/05/2020 0805   LEUKOCYTESUR NEGATIVE 09/05/2020 0805   Sepsis Labs: @LABRCNTIP (procalcitonin:4,lacticidven:4) ) Recent Results (from the past 240 hour(s))   Resp Panel by RT-PCR (Flu A&B, Covid) Nasopharyngeal Swab     Status: None   Collection Time: 04/07/21  6:43 PM   Specimen: Nasopharyngeal Swab; Nasopharyngeal(NP) swabs in vial transport medium  Result Value Ref Range Status   SARS Coronavirus 2 by RT PCR NEGATIVE NEGATIVE Final    Comment: (NOTE) SARS-CoV-2 target nucleic acids are NOT DETECTED.  The SARS-CoV-2 RNA is generally detectable in upper respiratory specimens during the acute phase of infection. The lowest concentration of SARS-CoV-2 viral copies this assay can detect is 138 copies/mL. A negative result does not preclude SARS-Cov-2 infection and should not be used as the sole basis for treatment or other patient management decisions. A negative result may occur with  improper specimen collection/handling, submission of specimen other than nasopharyngeal swab, presence of viral mutation(s) within the areas targeted by this assay, and inadequate number of viral copies(<138 copies/mL). A negative result must be combined with clinical observations, patient history, and epidemiological information. The expected result is Negative.  Fact Sheet for Patients:  EntrepreneurPulse.com.au  Fact Sheet for Healthcare Providers:  IncredibleEmployment.be  This test is no t yet approved or cleared by the [place] FDA and  has been authorized for detection and/or diagnosis of SARS-CoV-2 by FDA under an Emergency Use Authorization (EUA). This EUA will remain  in effect (meaning this test can be used) for the duration of the COVID-19 declaration under Section 564(b)(1) of the Act, 21 U.S.C.section 360bbb-3(b)(1), unless the authorization is terminated  or revoked sooner.       Influenza A by PCR NEGATIVE NEGATIVE Final   Influenza B by PCR NEGATIVE NEGATIVE Final    Comment: (NOTE) The Xpert Xpress SARS-CoV-2/FLU/RSV plus assay is intended as an aid in the diagnosis of influenza from  Nasopharyngeal swab specimens and should not be used as a sole basis for treatment. Nasal washings and aspirates are unacceptable for Xpert Xpress SARS-CoV-2/FLU/RSV testing.  Fact Sheet for Patients: EntrepreneurPulse.com.au  Fact Sheet for Healthcare Providers: IncredibleEmployment.be  This test is not yet approved or cleared by the [place] FDA and has been authorized for detection and/or diagnosis of SARS-CoV-2 by FDA under an Emergency Use Authorization (EUA). This EUA will remain in effect (meaning this test can be used) for the duration of the COVID-19 declaration under Section 564(b)(1) of the Act, 21 U.S.C. section 360bbb-3(b)(1), unless the authorization is terminated or revoked.  Performed at  [facility], [address] [place], [place] [address]          Radiology Studies: CT Angio Chest PE W and/or Wo Contrast  Result Date: 04/07/2021 CLINICAL DATA:  Pt c/o swelling to left leg and calf onset yesterday. Pt reports that she sits for long periods of time due to her legs being swollen. Pt is not able to ambulate much due to stage 4 colon cancer. hAs a dvt EXAM: CT ANGIOGRAPHY CHEST CT ABDOMEN AND PELVIS WITH CONTRAST TECHNIQUE: Multidetector CT imaging of the chest was performed using the standard protocol during bolus administration of intravenous contrast. Multiplanar CT image reconstructions and MIPs were obtained to evaluate the vascular anatomy. Multidetector CT imaging of the abdomen and pelvis was performed using the standard protocol during bolus administration of intravenous contrast. CONTRAST:  39mL OMNIPAQUE IOHEXOL 350 MG/ML SOLN COMPARISON:  CT abdomen pelvis 03/02/2021 FINDINGS: CTA CHEST FINDINGS Lines and tubes: Right chest wall Port-A-Cath with tip terminating at the inferior cavoatrial junction. Cardiovascular: Satisfactory opacification of the pulmonary arteries to the segmental level. Bilateral  lower lobe segmental and subsegmental pulmonary embolus. No central pulmonary embolus. Normal heart size. No pericardial effusion. Mediastinum/Nodes: No enlarged mediastinal, hilar, or axillary lymph nodes. Thyroid gland, trachea, and esophagus demonstrate no significant findings. Lungs/Pleura: 0.4 cm subpleural right lower lobe pulmonary nodule (4:53). Micronodule subpleural right lower lobe (4:63). Right upper lobe subpleural nodule along the major fissure (4:62). Several other scattered pulmonary micronodules bilaterally. Musculoskeletal: No chest wall abnormality. No suspicious lytic or blastic osseous lesions. No acute displaced fracture. Multilevel degenerative changes of the spine. Review of the MIP images confirms the above findings. CT ABDOMEN and PELVIS FINDINGS Hepatobiliary: Redemonstration of innumerable heterogeneous hypodense hepatic mass lesions replacing the majority of the hepatic parenchyma consistent with hepatic metastases. Pancreas: No focal lesion. Normal pancreatic contour. No surrounding inflammatory changes. No main pancreatic ductal dilatation. Spleen: Normal in size without focal abnormality. Adrenals/Urinary Tract: No adrenal nodule bilaterally. Bilateral kidneys enhance symmetrically. No hydronephrosis. No hydroureter. Linear calcification within the right kidney measuring up to 6 mm. Similar-appearing hypodense lesion within left kidney measuring up to 1.2 cm with a density of 32 Hounsfield units. The urinary bladder is unremarkable. Stomach/Bowel: Stomach is within normal limits. No evidence of bowel wall thickening or dilatation. Appendix appears normal. Vascular/Lymphatic: Redemonstration of a known nonocclusive left common femoral deep venous thrombosis extending into deep and femoral veins. The thrombus originates at the origin of the common femoral vein from the external iliac artery. No iliac deep venous thrombosis. The main portal, splenic, superior mesenteric veins are patent.  No abdominal aorta or iliac aneurysm. At least moderate atherosclerotic plaque of the aorta and its branches. No abdominal, pelvic, or inguinal lymphadenopathy. Reproductive: Coarsely calcified uterine lesion grossly similar in size and appearance likely represents a uterine fibroid. Otherwise uterus and bilateral adnexa are unremarkable. Other: Possible slightly decreased small volume simple fluid ascites. No intraperitoneal free gas. No organized fluid collection. Musculoskeletal: Diffuse moderate subcutaneus soft tissue edema. No abdominal wall hernia or abnormality. No suspicious lytic or blastic osseous lesions. No acute displaced fracture. M Review of the MIP images confirms the above findings. IMPRESSION: 1. Bilateral lower lobe segmental and subsegmental pulmonary emboli. No associated right heart strain or pulmonary infarction. 2. Known nonocclusive left common femoral deep venous thrombosis extending into the deep and femoral veins. 3. Innumerable hepatic metastases. 4. Possibly slightly decreased small volume simple free fluid ascites. 5. Right chest wall Port-A-Cath with tip terminating at  the inferior cavoatrial junction. Other imaging findings of potential clinical significance: 1. Scattered pulmonary micronodules bilaterally. 2. Indeterminate stable in size 1.2 cm left renal lesion. Recommend nonemergent MRI renal protocol for further evaluation. When the patient is clinically stable and able to follow directions and hold their breath (preferably as an outpatient) further evaluation with dedicated abdominal MRI should be considered. 3. Degenerative uterine fibroid. These results were called by telephone at the time of interpretation on 04/07/2021 at 7:21 pm to provider [facility] , who verbally acknowledged these results. Electronically Signed   By: Iven Finn M.D.   On: 04/07/2021 19:27   CT ABDOMEN PELVIS W CONTRAST  Result Date: 04/07/2021 CLINICAL DATA:  Pt c/o swelling to left leg  and calf onset yesterday. Pt reports that she sits for long periods of time due to her legs being swollen. Pt is not able to ambulate much due to stage 4 colon cancer. hAs a dvt EXAM: CT ANGIOGRAPHY CHEST CT ABDOMEN AND PELVIS WITH CONTRAST TECHNIQUE: Multidetector CT imaging of the chest was performed using the standard protocol during bolus administration of intravenous contrast. Multiplanar CT image reconstructions and MIPs were obtained to evaluate the vascular anatomy. Multidetector CT imaging of the abdomen and pelvis was performed using the standard protocol during bolus administration of intravenous contrast. CONTRAST:  16mL OMNIPAQUE IOHEXOL 350 MG/ML SOLN COMPARISON:  CT abdomen pelvis 03/02/2021 FINDINGS: CTA CHEST FINDINGS Lines and tubes: Right chest wall Port-A-Cath with tip terminating at the inferior cavoatrial junction. Cardiovascular: Satisfactory opacification of the pulmonary arteries to the segmental level. Bilateral lower lobe segmental and subsegmental pulmonary embolus. No central pulmonary embolus. Normal heart size. No pericardial effusion. Mediastinum/Nodes: No enlarged mediastinal, hilar, or axillary lymph nodes. Thyroid gland, trachea, and esophagus demonstrate no significant findings. Lungs/Pleura: 0.4 cm subpleural right lower lobe pulmonary nodule (4:53). Micronodule subpleural right lower lobe (4:63). Right upper lobe subpleural nodule along the major fissure (4:62). Several other scattered pulmonary micronodules bilaterally. Musculoskeletal: No chest wall abnormality. No suspicious lytic or blastic osseous lesions. No acute displaced fracture. Multilevel degenerative changes of the spine. Review of the MIP images confirms the above findings. CT ABDOMEN and PELVIS FINDINGS Hepatobiliary: Redemonstration of innumerable heterogeneous hypodense hepatic mass lesions replacing the majority of the hepatic parenchyma consistent with hepatic metastases. Pancreas: No focal lesion. Normal  pancreatic contour. No surrounding inflammatory changes. No main pancreatic ductal dilatation. Spleen: Normal in size without focal abnormality. Adrenals/Urinary Tract: No adrenal nodule bilaterally. Bilateral kidneys enhance symmetrically. No hydronephrosis. No hydroureter. Linear calcification within the right kidney measuring up to 6 mm. Similar-appearing hypodense lesion within left kidney measuring up to 1.2 cm with a density of 32 Hounsfield units. The urinary bladder is unremarkable. Stomach/Bowel: Stomach is within normal limits. No evidence of bowel wall thickening or dilatation. Appendix appears normal. Vascular/Lymphatic: Redemonstration of a known nonocclusive left common femoral deep venous thrombosis extending into deep and femoral veins. The thrombus originates at the origin of the common femoral vein from the external iliac artery. No iliac deep venous thrombosis. The main portal, splenic, superior mesenteric veins are patent. No abdominal aorta or iliac aneurysm. At least moderate atherosclerotic plaque of the aorta and its branches. No abdominal, pelvic, or inguinal lymphadenopathy. Reproductive: Coarsely calcified uterine lesion grossly similar in size and appearance likely represents a uterine fibroid. Otherwise uterus and bilateral adnexa are unremarkable. Other: Possible slightly decreased small volume simple fluid ascites. No intraperitoneal free gas. No organized fluid collection. Musculoskeletal: Diffuse moderate subcutaneus soft tissue edema.  No abdominal wall hernia or abnormality. No suspicious lytic or blastic osseous lesions. No acute displaced fracture. M Review of the MIP images confirms the above findings. IMPRESSION: 1. Bilateral lower lobe segmental and subsegmental pulmonary emboli. No associated right heart strain or pulmonary infarction. 2. Known nonocclusive left common femoral deep venous thrombosis extending into the deep and femoral veins. 3. Innumerable hepatic metastases.  4. Possibly slightly decreased small volume simple free fluid ascites. 5. Right chest wall Port-A-Cath with tip terminating at the inferior cavoatrial junction. Other imaging findings of potential clinical significance: 1. Scattered pulmonary micronodules bilaterally. 2. Indeterminate stable in size 1.2 cm left renal lesion. Recommend nonemergent MRI renal protocol for further evaluation. When the patient is clinically stable and able to follow directions and hold their breath (preferably as an outpatient) further evaluation with dedicated abdominal MRI should be considered. 3. Degenerative uterine fibroid. These results were called by telephone at the time of interpretation on 04/07/2021 at 7:21 pm to provider [facility] , who verbally acknowledged these results. Electronically Signed   By: Iven Finn M.D.   On: 04/07/2021 19:27   US Venous Img Lower Unilateral Left  Result Date: 04/07/2021 CLINICAL DATA:  Left calf pain and swelling EXAM: Left LOWER EXTREMITY VENOUS DOPPLER ULTRASOUND TECHNIQUE: Gray-scale sonography with graded compression, as well as color Doppler and duplex ultrasound were performed to evaluate the lower extremity deep venous systems from the level of the common femoral vein and including the common femoral, femoral, profunda femoral, popliteal and calf veins including the posterior tibial, peroneal and gastrocnemius veins when visible. The superficial great saphenous vein was also interrogated. Spectral Doppler was utilized to evaluate flow at rest and with distal augmentation maneuvers in the common femoral, femoral and popliteal veins. COMPARISON:  None. FINDINGS: Contralateral Common Femoral Vein: Respiratory phasicity is normal and symmetric with the symptomatic side. No evidence of thrombus. Normal compressibility. Common Femoral Vein: Occlusive thrombus within the common femoral vein. Vein is noncompressible. Saphenofemoral Junction: Positive for occlusive thrombus. Vein is  noncompressible Profunda Femoral Vein: Positive for near occlusive thrombus with some flow present. Partially compressible. Femoral Vein: Positive for occlusive thrombus. Vein is noncompressible Popliteal Vein: Positive for occlusive thrombus. Vein is noncompressible. Calf Veins: Positive for occlusive thrombus within the posterior tibial and peroneal veins. IMPRESSION: Positive for acute extensive occlusive thrombus extending from left common femoral vein to the calf veins. Critical Value/emergent results were called by telephone at the time of interpretation on 04/07/2021 at 5:51 pm to provider Davonna Belling , who verbally acknowledged these results. Electronically Signed   By: Donavan Foil M.D.   On: 04/07/2021 17:51   DG Chest Portable 1 View  Result Date: 04/07/2021 CLINICAL DATA:  Chest pain EXAM: PORTABLE CHEST 1 VIEW COMPARISON:  03/30/2021 FINDINGS: Right-sided chest port is unchanged in positioning. Normal heart size. Low lung volumes. No focal airspace consolidation, pleural effusion, or pneumothorax. IMPRESSION: Low lung volumes.  No acute cardiopulmonary findings. Electronically Signed   By: Davina Poke D.O.   On: 04/07/2021 18:11   ECHOCARDIOGRAM COMPLETE  Result Date: 04/08/2021    ECHOCARDIOGRAM REPORT   Patient Name:   Amaurie Trickett Date of Exam: 04/08/2021 Medical Rec #:  [id number]    Height:       62.0 in Accession #:    [id number]   Weight:       164.7 lb Date of Birth:  [birth year]    BSA:          1.760 m  Patient Age:    48 years     BP:           123/76 mmHg Patient Gender: F            HR:           99 bpm. Exam Location:  Inpatient Procedure: 2D Echo, Color Doppler and Cardiac Doppler Indications:    I26.02 Pulmonary embolus  History:        Patient has no prior history of Echocardiogram examinations.                 Risk Factors:Hypertension and Dyslipidemia.  Sonographer:    Raquel Sarna Senior RDCS Referring Phys: [id number] [place]  1. Left ventricular ejection  fraction, by estimation, is 60 to 65%. The left ventricle has normal function. The left ventricle has no regional wall motion abnormalities. Left ventricular diastolic parameters are consistent with Grade I diastolic dysfunction (impaired relaxation).  2. Right ventricular systolic function is normal. The right ventricular size is normal.  3. The mitral valve is abnormal. Mild mitral valve regurgitation. No evidence of mitral stenosis.  4. The aortic valve is tricuspid. There is mild calcification of the aortic valve. Aortic valve regurgitation is trivial. Mild aortic valve sclerosis is present, with no evidence of aortic valve stenosis.  5. The inferior vena cava is normal in size with greater than 50% respiratory variability, suggesting right atrial pressure of 3 mmHg. FINDINGS  Left Ventricle: Left ventricular ejection fraction, by estimation, is 60 to 65%. The left ventricle has normal function. The left ventricle has no regional wall motion abnormalities. The left ventricular internal cavity size was normal in size. There is  no left ventricular hypertrophy. Left ventricular diastolic parameters are consistent with Grade I diastolic dysfunction (impaired relaxation). Right Ventricle: The right ventricular size is normal. No increase in right ventricular wall thickness. Right ventricular systolic function is normal. Left Atrium: Left atrial size was normal in size. Right Atrium: Right atrial size was normal in size. Pericardium: There is no evidence of pericardial effusion. Mitral Valve: The mitral valve is abnormal. There is mild thickening of the mitral valve leaflet(s). There is mild calcification of the mitral valve leaflet(s). Mild mitral valve regurgitation. No evidence of mitral valve stenosis. Tricuspid Valve: The tricuspid valve is normal in structure. Tricuspid valve regurgitation is not demonstrated. No evidence of tricuspid stenosis. Aortic Valve: The aortic valve is tricuspid. There is mild  calcification of the aortic valve. Aortic valve regurgitation is trivial. Mild aortic valve sclerosis is present, with no evidence of aortic valve stenosis. Pulmonic Valve: The pulmonic valve was normal in structure. Pulmonic valve regurgitation is not visualized. No evidence of pulmonic stenosis. Aorta: The aortic root is normal in size and structure. Venous: The inferior vena cava is normal in size with greater than 50% respiratory variability, suggesting right atrial pressure of 3 mmHg. IAS/Shunts: No atrial level shunt detected by color flow Doppler.  LEFT VENTRICLE PLAX 2D LVIDd:         3.60 cm  Diastology LVIDs:         2.30 cm  LV e' medial:    7.18 cm/s LV PW:         0.70 cm  LV E/e' medial:  8.0 LV IVS:        0.70 cm  LV e' lateral:   9.36 cm/s LVOT diam:     1.80 cm  LV E/e' lateral: 6.1 LV SV:  45 LV SV Index:   25 LVOT Area:     2.54 cm  RIGHT VENTRICLE RV S prime:     5.33 cm/s TAPSE (M-mode): 1.5 cm LEFT ATRIUM             Index       RIGHT ATRIUM          Index LA diam:        3.10 cm 1.76 cm/m  RA Area:     5.36 cm LA Vol (A2C):   32.6 ml 18.52 ml/m RA Volume:   6.54 ml  3.72 ml/m LA Vol (A4C):   24.8 ml 14.09 ml/m LA Biplane Vol: 30.5 ml 17.33 ml/m  AORTIC VALVE LVOT Vmax:   112.00 cm/s LVOT Vmean:  75.700 cm/s LVOT VTI:    0.176 m  AORTA Ao Root diam: 2.60 cm Ao Asc diam:  2.70 cm MITRAL VALVE MV Area (PHT): 2.93 cm      SHUNTS MV Decel Time: 259 msec      Systemic VTI:  0.18 m MR Peak grad:    128.1 mmHg  Systemic Diam: 1.80 cm MR Mean grad:    93.0 mmHg MR Vmax:         566.00 cm/s MR Vmean:        461.0 cm/s MR PISA:         0.57 cm MR PISA Eff ROA: 4 mm MR PISA Radius:  0.30 cm MV E velocity: 57.40 cm/s MV A velocity: 87.40 cm/s MV E/A ratio:  0.66 Jenkins Rouge MD Electronically signed by Jenkins Rouge MD Signature Date/Time: 04/08/2021/10:02:34 AM    Final       Scheduled Meds:  amLODipine  5 mg Oral Daily   apixaban  10 mg Oral BID   Followed by   Derrill Memo ON  04/14/2021] apixaban  5 mg Oral BID   Chlorhexidine Gluconate Cloth  6 each Topical Daily   sodium chloride flush  10-40 mL Intracatheter Q12H   Continuous Infusions:  sodium chloride 75 mL/hr at 04/08/21 0112     LOS: 1 day      Debbe Odea, MD [facility] Pager: [email or web address] 04/08/2021, 1:25 PM

## 2021-04-08 NOTE — Plan of Care (Signed)

## 2021-04-08 NOTE — Progress Notes (Signed)
Echocardiogram 2D Echocardiogram has been performed.  Oneal Deputy Lilie Vezina RDCS 04/08/2021, 9:21 AM

## 2021-04-09 DIAGNOSIS — I2699 Other pulmonary embolism without acute cor pulmonale: Secondary | ICD-10-CM | POA: Diagnosis not present

## 2021-04-09 LAB — BASIC METABOLIC PANEL
Anion gap: 7 (ref 5–15)
BUN: 24 mg/dL — ABNORMAL HIGH (ref 8–23)
CO2: 20 mmol/L — ABNORMAL LOW (ref 22–32)
Calcium: 7.8 mg/dL — ABNORMAL LOW (ref 8.9–10.3)
Chloride: 100 mmol/L (ref 98–111)
Creatinine, Ser: 0.95 mg/dL (ref 0.44–1.00)
GFR, Estimated: >60 mL/min (ref >60)
Glucose, Bld: 77 mg/dL (ref 70–99)
Potassium: 4.7 mmol/L (ref 3.5–5.1)
Sodium: 127 mmol/L — ABNORMAL LOW (ref 135–145)

## 2021-04-09 MED ORDER — HYDROCORTISONE ACETATE 25 MG RE SUPP
25.0000 mg | Freq: Two times a day (BID) | RECTAL | Status: DC
  Administered 2021-04-09 – 2021-04-12 (×7): 25 mg via RECTAL
  Filled 2021-04-09 (×8): qty 1

## 2021-04-09 NOTE — Evaluation (Addendum)
Physical Therapy Evaluation Patient Details Name: Rachel Frank MRN: [id number] DOB: 1960-12-23 Today's Date: 04/09/2021  History of Present Illness  60 y.o. female with medical history significant for Stage 4 colon cancer followed by Oncology, HTN, HLD who presents with swelling of her legs that is worse on the left than the right. Dx of BLE DVT, PE. Heparin started at 19:14 on 04/07/21.  Clinical Impression  Pt admitted with above diagnosis. Pt ambulated 6' + 15' with RW with seated rest break, distance limited by fatigue.  Pt currently with functional limitations due to the deficits listed below (see PT Problem List). Pt will benefit from skilled PT to increase their independence and safety with mobility to allow discharge to the venue listed below.          Recommendations for follow up therapy are one component of a multi-disciplinary discharge planning process, led by the attending physician.  Recommendations may be updated based on patient status, additional functional criteria and insurance authorization.  Follow Up Recommendations Home health PT; supervision for mobility    Equipment Recommendations  3in1 (PT);Other (comment) (rollator)    Recommendations for Other Services       Precautions / Restrictions Precautions Precautions: Fall Precaution Comments: pt denies falls in past 1 year Restrictions Weight Bearing Restrictions: No      Mobility  Bed Mobility Overal bed mobility: Needs Assistance Bed Mobility: Rolling;Sidelying to Sit Rolling: Min assist Sidelying to sit: Mod assist       General bed mobility comments: assist to initiate roll, then to raise trunk    Transfers   Equipment used: Rolling walker (2 wheeled)             General transfer comment: VCs hand placement  Ambulation/Gait   Gait Distance (Feet): 21 Feet Assistive device: Rolling walker (2 wheeled) Gait Pattern/deviations: Decreased stride length Gait velocity: decr   General Gait  Details: 6' + 15' with seated rest break, HR 114, SaO2 100% on room air ~2 minutes after walking (pulse ox not available during ambulation), 1-2/4 dyspnea  Stairs            Wheelchair Mobility    Modified Rankin (Stroke Patients Only)       Balance Overall balance assessment: Needs assistance Sitting-balance support: Feet supported Sitting balance-Leahy Scale: Good     Standing balance support: Bilateral upper extremity supported Standing balance-Leahy Scale: Poor Standing balance comment: relies on BUE support                             Pertinent Vitals/Pain Pain Assessment: 0-10 Pain Score: 3  Pain Location: abdomen Pain Descriptors / Indicators: Aching Pain Intervention(s): Limited activity within patient's tolerance;Monitored during session;Repositioned    Home Living Family/patient expects to be discharged to:: Private residence Living Arrangements: Spouse/significant other Available Help at Discharge: Family;Available PRN/intermittently Type of Home: House Home Access: Stairs to enter Entrance Stairs-Rails: Left Entrance Stairs-Number of Steps: 5 Home Layout: Two level;Able to live on main level with bedroom/bathroom Home Equipment: Shower seat      Prior Function Level of Independence: Needs assistance   Gait / Transfers Assistance Needed: assist for bed mobility and to walk short distances to bathroom  ADL's / Homemaking Assistance Needed: assist needed, was sponge bathing PTA        Hand Dominance        Extremity/Trunk Assessment   Upper Extremity Assessment Upper Extremity Assessment: Overall WFL for tasks assessed  Lower Extremity Assessment Lower Extremity Assessment: Generalized weakness;LLE deficits/detail;RLE deficits/detail RLE Deficits / Details: pitting edema noted B feet and lower legs RLE Sensation: history of peripheral neuropathy LLE Deficits / Details: knee ext -4/5 LLE Sensation: history of peripheral  neuropathy    Cervical / Trunk Assessment Cervical / Trunk Assessment: Normal  Communication   Communication: No difficulties  Cognition Arousal/Alertness: Awake/alert Behavior During Therapy: WFL for tasks assessed/performed Overall Cognitive Status: Within Functional Limits for tasks assessed                                        General Comments      Exercises  Ankle pumps x 10 both AROM seated Long arc quads x 10 both AROM seated   Assessment/Plan    PT Assessment Patient needs continued PT services  PT Problem List Decreased mobility;Decreased strength;Decreased activity tolerance;Decreased knowledge of use of DME;Pain       PT Treatment Interventions Gait training;DME instruction;Therapeutic exercise;Therapeutic activities;Stair training;Functional mobility training;Patient/family education    PT Goals (Current goals can be found in the Care Plan section)  Acute Rehab PT Goals Patient Stated Goal: to walk farther, likes to be outside PT Goal Formulation: With patient/family Time For Goal Achievement: 04/23/21 Potential to Achieve Goals: Fair    Frequency     Barriers to discharge        Co-evaluation               AM-PAC PT "6 Clicks" Mobility  Outcome Measure Help needed turning from your back to your side while in a flat bed without using bedrails?: A Little Help needed moving from lying on your back to sitting on the side of a flat bed without using bedrails?: A Lot Help needed moving to and from a bed to a chair (including a wheelchair)?: A Little Help needed standing up from a chair using your arms (e.g., wheelchair or bedside chair)?: A Little Help needed to walk in hospital room?: A Little Help needed climbing 3-5 steps with a railing? : A Lot 6 Click Score: 16    End of Session Equipment Utilized During Treatment: Gait belt Activity Tolerance: Patient limited by fatigue Patient left: in chair;with call bell/phone within  reach;with family/visitor present;with chair alarm set Nurse Communication: Mobility status PT Visit Diagnosis: Difficulty in walking, not elsewhere classified (R26.2);Muscle weakness (generalized) (M62.81)    Time: 4098-1191 PT Time Calculation (min) (ACUTE ONLY): 31 min   Charges:   PT Evaluation $PT Eval Moderate Complexity: 1 Mod PT Treatments $Gait Training: 8-22 mins       Blondell Reveal Kistler PT 04/09/2021  Acute Rehabilitation Services Pager [phone] Office [phone]

## 2021-04-09 NOTE — Progress Notes (Signed)
PROGRESS NOTE    Rachel Frank   PJA:[id number]  DOB: Apr 16, 1961  DOA: 04/07/2021 PCP: [name], MD   Brief Narrative:  Rachel Frank is a 60 y.o. married female with medical history significant for Stage 4 colon cancer followed by Oncology, HTN, HLD who presents with swelling of her legs that is worse on the left than the right. According to the patient and her spouse, she has steadily been losing her strength and is now having difficulty even walking to the bathroom.  She spends most of her day in bed.  She underwent a paracentesis last week which was therapeutic.  Her oncologist, Dr. Benay Spice told her last week that he would not be giving her any further chemotherapy.  Found to have a DVT of the left leg and bilateral PEs without any right heart strain.  Started on heparin infusion and admitted to the hospital.  She has been transitioned to Eliquis.  Palliative care consulted for goals of care.  Awaiting input from oncologist on 9/27.   Subjective: Discussed in detail with patient and spouse at bedside.  Left lower extremity heaviness without pain.  Right lower rib cage pleuritic pain on deep inspiration only.  No other chest pains reported.  Denies dyspnea.  Very limited mobility at home.  For the last 5 days, has not really ambulated and prior to that she was basically being removed with the help of her wheelchair.    Assessment & Plan:   Principal Problem:   Acute pulmonary embolism without acute cor pulmonale , left leg DVT in setting of metastatic cancer and severe deconditioning-generalized weakness Sinus tachycardia - According to the patient's spouse, she is mostly bedbound now -Lower extremity vascular ultrasound reveals acute extensive occlusive thrombosis extending from left common femoral vein to the calf veins. - TTE: LVEF 60-65%, no regional wall motion abnormalities, grade 1 diastolic dysfunction, RV systolic function and size normal. - Heparin has been switched to  Eliquis - the patient states that she had bright red blood in her stool about a week ago she does have a history of hemorrhoids but has not been constipated.  In fact she reports diarrhea for which she takes antimotility medications.  Initiated hydrocortisone suppositories for hemorrhoids. -Both patient and spouse understand to monitor stools and, if bleeding is noted, to stop Eliquis - Not hypoxic and hemodynamically stable. - Overall extremely poor prognosis.  Await palliative care consultation and input from her primary oncologist who reportedly is off today and should be back 9/27  Active Problems:    Colorectal cancer, stage IV with abdominal ascites -Last CEA level was 45, 662 -She was due for chemotherapy last week however it was held by oncologist Dr. Benay Spice who also had a conversation with Rachel Frank and her husband regarding her poor prognosis - She underwent a therapeutic paracentesis on 9/21, on the same day of her outpatient visit with oncology  - Nontense ascites, no indication for paracentesis at current time. -In addition to a paracentesis, Dr. Benay Spice recommended discontinuing HCTZ and spironolactone-he planned to follow-up with her on 04/10/2021    Hyponatremia -Related to third spacing and possibly poor oral intake of solute.  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 - It appears that her baseline is around 127-130 -She has moderate ascites today and 3+ pitting edema in bilateral lower extremities -IV fluids discontinued. - Serum sodium stable/127 today.  Essential hypertension - Continue amlodipine.  Controlled.     Prolonged QT interval -QTC  491 on EKG on 9/24  Hypoalbuminemia, elevated alkaline phosphatase - Related to underlying cancer  Normocytic anemia - Hemoglobin ranges from 10-11. Stable.  Hemorrhoids Intermittent small-volume rectal bleeding at home.  Initiated hydrocortisone suppositories.    DVT prophylaxis:  apixaban (ELIQUIS)  tablet 10 mg  apixaban (ELIQUIS) tablet 5 mg  Code Status: Full code Family Communication: Spouse at bedside. Disposition Plan:  Status is: Inpatient  Remains inpatient appropriate because:Inpatient level of care appropriate due to severity of illness  Dispo: The patient is from: Home              Anticipated d/c is to: Home              Patient currently is not medically stable to d/c.   Difficult to place patient No      Consultants:  Palliative care medicine-pending Medical oncology-Dr. Benay Spice, in a.m.  Procedures:  None  Antimicrobials:  Anti-infectives (From admission, onward)    None        Objective: Vitals:   04/08/21 2025 04/09/21 0536 04/09/21 0900 04/09/21 1150  BP: 123/86 126/82 140/90 (!) 141/90  Pulse: 98 (!) 102 (!) 105 (!) 107  Resp: 14 20  16   Temp: 97.7 F (36.5 C) 97.7 F (36.5 C)  98 F (36.7 C)  TempSrc: Oral Oral  Oral  SpO2: 100% 99% 100% 100%  Weight:      Height:        Intake/Output Summary (Last 24 hours) at 04/09/2021 1526 Last data filed at 04/09/2021 1217 Gross per 24 hour  Intake 1468.67 ml  Output 950 ml  Net 518.67 ml   Filed Weights   04/07/21 1449 04/07/21 2214  Weight: 76 kg 74.7 kg    Examination: General exam: Middle-age female, moderately built and poorly nourished, looks older than stated age, lying comfortably propped up in bed.  Not hypoxic on room air. Respiratory system: Slightly diminished breath sounds in the bases but otherwise clear to auscultation.  No increased work of breathing.  No pleural rub appreciated. Cardiovascular system: S1 and S2 heard, RRR.  No JVD, murmurs.  2+ pitting bilateral leg edema.  Telemetry personally reviewed: Sinus tachycardia in the 100s. Gastrointestinal system: Abdomen soft, non-tender, moderately distended. Normal bowel sounds. Central nervous system: Alert and oriented. No focal neurological deficits. Extremities: No cyanosis, clubbing 2+ pitting edema in both lower  extremities, left greater than sign right. Skin: No rashes or ulcers Psychiatry:  Mood & affect flat    Data Reviewed: I have personally reviewed following labs and imaging studies  CBC: Recent Labs  Lab 04/04/21 0939 04/07/21 1640 04/08/21 0122  WBC 3.6* 4.0 4.8  NEUTROABS 1.4* 1.7  --   HGB 10.6* 10.5* 9.8*  HCT 33.1* 32.3* 29.9*  MCV 91.7 90.0 91.2  PLT 252 243 299   Basic Metabolic Panel: Recent Labs  Lab 04/04/21 0939 04/07/21 1640 04/08/21 0122 04/09/21 0410  NA 127* 124* 128* 127*  K 4.6 4.4 4.6 4.7  CL 95* 94* 98 100  CO2 20* 22 20* 20*  GLUCOSE 130* 100* 92 77  BUN 40* 30* 30* 24*  CREATININE 1.49* 0.90 1.04* 0.95  CALCIUM 8.3* 7.8* 7.6* 7.8*   GFR: Estimated Creatinine Clearance: 58.1 mL/min (by C-G formula based on SCr of 0.95 mg/dL). Liver Function Tests: Recent Labs  Lab 04/04/21 0939 04/07/21 1640  AST 47* 40  ALT 13 11  ALKPHOS 774* 619*  BILITOT 3.6* 2.6*  PROT 6.1* 5.7*  ALBUMIN 2.2* 2.0*  Coagulation Profile: Recent Labs  Lab 04/07/21 2230  INR 1.9*   Recent Results (from the past 240 hour(s))  Resp Panel by RT-PCR (Flu A&B, Covid) Nasopharyngeal Swab     Status: None   Collection Time: 04/07/21  6:43 PM   Specimen: Nasopharyngeal Swab; Nasopharyngeal(NP) swabs in vial transport medium  Result Value Ref Range Status   SARS Coronavirus 2 by RT PCR NEGATIVE NEGATIVE Final    Comment: (NOTE) SARS-CoV-2 target nucleic acids are NOT DETECTED.  The SARS-CoV-2 RNA is generally detectable in upper respiratory specimens during the acute phase of infection. The lowest concentration of SARS-CoV-2 viral copies this assay can detect is 138 copies/mL. A negative result does not preclude SARS-Cov-2 infection and should not be used as the sole basis for treatment or other patient management decisions. A negative result may occur with  improper specimen collection/handling, submission of specimen other than nasopharyngeal swab, presence of  viral mutation(s) within the areas targeted by this assay, and inadequate number of viral copies(<138 copies/mL). A negative result must be combined with clinical observations, patient history, and epidemiological information. The expected result is Negative.  Fact Sheet for Patients:  EntrepreneurPulse.com.au  Fact Sheet for Healthcare Providers:  IncredibleEmployment.be  This test is no t yet approved or cleared by the [place] FDA and  has been authorized for detection and/or diagnosis of SARS-CoV-2 by FDA under an Emergency Use Authorization (EUA). This EUA will remain  in effect (meaning this test can be used) for the duration of the COVID-19 declaration under Section 564(b)(1) of the Act, 21 U.S.C.section 360bbb-3(b)(1), unless the authorization is terminated  or revoked sooner.       Influenza A by PCR NEGATIVE NEGATIVE Final   Influenza B by PCR NEGATIVE NEGATIVE Final    Comment: (NOTE) The Xpert Xpress SARS-CoV-2/FLU/RSV plus assay is intended as an aid in the diagnosis of influenza from Nasopharyngeal swab specimens and should not be used as a sole basis for treatment. Nasal washings and aspirates are unacceptable for Xpert Xpress SARS-CoV-2/FLU/RSV testing.  Fact Sheet for Patients: EntrepreneurPulse.com.au  Fact Sheet for Healthcare Providers: IncredibleEmployment.be  This test is not yet approved or cleared by the [place] FDA and has been authorized for detection and/or diagnosis of SARS-CoV-2 by FDA under an Emergency Use Authorization (EUA). This EUA will remain in effect (meaning this test can be used) for the duration of the COVID-19 declaration under Section 564(b)(1) of the Act, 21 U.S.C. section 360bbb-3(b)(1), unless the authorization is terminated or revoked.  Performed at [facility], [address], [place], [place] [address]           Radiology Studies: CT Angio Chest PE W and/or Wo Contrast  Result Date: 04/07/2021 CLINICAL DATA:  Pt c/o swelling to left leg and calf onset yesterday. Pt reports that she sits for long periods of time due to her legs being swollen. Pt is not able to ambulate much due to stage 4 colon cancer. hAs a dvt EXAM: CT ANGIOGRAPHY CHEST CT ABDOMEN AND PELVIS WITH CONTRAST TECHNIQUE: Multidetector CT imaging of the chest was performed using the standard protocol during bolus administration of intravenous contrast. Multiplanar CT image reconstructions and MIPs were obtained to evaluate the vascular anatomy. Multidetector CT imaging of the abdomen and pelvis was performed using the standard protocol during bolus administration of intravenous contrast. CONTRAST:  19mL OMNIPAQUE IOHEXOL 350 MG/ML SOLN COMPARISON:  CT abdomen pelvis 03/02/2021 FINDINGS: CTA CHEST FINDINGS Lines and tubes: Right chest wall Port-A-Cath  with tip terminating at the inferior cavoatrial junction. Cardiovascular: Satisfactory opacification of the pulmonary arteries to the segmental level. Bilateral lower lobe segmental and subsegmental pulmonary embolus. No central pulmonary embolus. Normal heart size. No pericardial effusion. Mediastinum/Nodes: No enlarged mediastinal, hilar, or axillary lymph nodes. Thyroid gland, trachea, and esophagus demonstrate no significant findings. Lungs/Pleura: 0.4 cm subpleural right lower lobe pulmonary nodule (4:53). Micronodule subpleural right lower lobe (4:63). Right upper lobe subpleural nodule along the major fissure (4:62). Several other scattered pulmonary micronodules bilaterally. Musculoskeletal: No chest wall abnormality. No suspicious lytic or blastic osseous lesions. No acute displaced fracture. Multilevel degenerative changes of the spine. Review of the MIP images confirms the above findings. CT ABDOMEN and PELVIS FINDINGS Hepatobiliary: Redemonstration of innumerable heterogeneous hypodense  hepatic mass lesions replacing the majority of the hepatic parenchyma consistent with hepatic metastases. Pancreas: No focal lesion. Normal pancreatic contour. No surrounding inflammatory changes. No main pancreatic ductal dilatation. Spleen: Normal in size without focal abnormality. Adrenals/Urinary Tract: No adrenal nodule bilaterally. Bilateral kidneys enhance symmetrically. No hydronephrosis. No hydroureter. Linear calcification within the right kidney measuring up to 6 mm. Similar-appearing hypodense lesion within left kidney measuring up to 1.2 cm with a density of 32 Hounsfield units. The urinary bladder is unremarkable. Stomach/Bowel: Stomach is within normal limits. No evidence of bowel wall thickening or dilatation. Appendix appears normal. Vascular/Lymphatic: Redemonstration of a known nonocclusive left common femoral deep venous thrombosis extending into deep and femoral veins. The thrombus originates at the origin of the common femoral vein from the external iliac artery. No iliac deep venous thrombosis. The main portal, splenic, superior mesenteric veins are patent. No abdominal aorta or iliac aneurysm. At least moderate atherosclerotic plaque of the aorta and its branches. No abdominal, pelvic, or inguinal lymphadenopathy. Reproductive: Coarsely calcified uterine lesion grossly similar in size and appearance likely represents a uterine fibroid. Otherwise uterus and bilateral adnexa are unremarkable. Other: Possible slightly decreased small volume simple fluid ascites. No intraperitoneal free gas. No organized fluid collection. Musculoskeletal: Diffuse moderate subcutaneus soft tissue edema. No abdominal wall hernia or abnormality. No suspicious lytic or blastic osseous lesions. No acute displaced fracture. M Review of the MIP images confirms the above findings. IMPRESSION: 1. Bilateral lower lobe segmental and subsegmental pulmonary emboli. No associated right heart strain or pulmonary infarction. 2.  Known nonocclusive left common femoral deep venous thrombosis extending into the deep and femoral veins. 3. Innumerable hepatic metastases. 4. Possibly slightly decreased small volume simple free fluid ascites. 5. Right chest wall Port-A-Cath with tip terminating at the inferior cavoatrial junction. Other imaging findings of potential clinical significance: 1. Scattered pulmonary micronodules bilaterally. 2. Indeterminate stable in size 1.2 cm left renal lesion. Recommend nonemergent MRI renal protocol for further evaluation. When the patient is clinically stable and able to follow directions and hold their breath (preferably as an outpatient) further evaluation with dedicated abdominal MRI should be considered. 3. Degenerative uterine fibroid. These results were called by telephone at the time of interpretation on 04/07/2021 at 7:21 pm to provider [facility] , who verbally acknowledged these results. Electronically Signed   By: Iven Finn M.D.   On: 04/07/2021 19:27   CT ABDOMEN PELVIS W CONTRAST  Result Date: 04/07/2021 CLINICAL DATA:  Pt c/o swelling to left leg and calf onset yesterday. Pt reports that she sits for long periods of time due to her legs being swollen. Pt is not able to ambulate much due to stage 4 colon cancer. hAs a dvt EXAM: CT ANGIOGRAPHY  CHEST CT ABDOMEN AND PELVIS WITH CONTRAST TECHNIQUE: Multidetector CT imaging of the chest was performed using the standard protocol during bolus administration of intravenous contrast. Multiplanar CT image reconstructions and MIPs were obtained to evaluate the vascular anatomy. Multidetector CT imaging of the abdomen and pelvis was performed using the standard protocol during bolus administration of intravenous contrast. CONTRAST:  66mL OMNIPAQUE IOHEXOL 350 MG/ML SOLN COMPARISON:  CT abdomen pelvis 03/02/2021 FINDINGS: CTA CHEST FINDINGS Lines and tubes: Right chest wall Port-A-Cath with tip terminating at the inferior cavoatrial junction.  Cardiovascular: Satisfactory opacification of the pulmonary arteries to the segmental level. Bilateral lower lobe segmental and subsegmental pulmonary embolus. No central pulmonary embolus. Normal heart size. No pericardial effusion. Mediastinum/Nodes: No enlarged mediastinal, hilar, or axillary lymph nodes. Thyroid gland, trachea, and esophagus demonstrate no significant findings. Lungs/Pleura: 0.4 cm subpleural right lower lobe pulmonary nodule (4:53). Micronodule subpleural right lower lobe (4:63). Right upper lobe subpleural nodule along the major fissure (4:62). Several other scattered pulmonary micronodules bilaterally. Musculoskeletal: No chest wall abnormality. No suspicious lytic or blastic osseous lesions. No acute displaced fracture. Multilevel degenerative changes of the spine. Review of the MIP images confirms the above findings. CT ABDOMEN and PELVIS FINDINGS Hepatobiliary: Redemonstration of innumerable heterogeneous hypodense hepatic mass lesions replacing the majority of the hepatic parenchyma consistent with hepatic metastases. Pancreas: No focal lesion. Normal pancreatic contour. No surrounding inflammatory changes. No main pancreatic ductal dilatation. Spleen: Normal in size without focal abnormality. Adrenals/Urinary Tract: No adrenal nodule bilaterally. Bilateral kidneys enhance symmetrically. No hydronephrosis. No hydroureter. Linear calcification within the right kidney measuring up to 6 mm. Similar-appearing hypodense lesion within left kidney measuring up to 1.2 cm with a density of 32 Hounsfield units. The urinary bladder is unremarkable. Stomach/Bowel: Stomach is within normal limits. No evidence of bowel wall thickening or dilatation. Appendix appears normal. Vascular/Lymphatic: Redemonstration of a known nonocclusive left common femoral deep venous thrombosis extending into deep and femoral veins. The thrombus originates at the origin of the common femoral vein from the external iliac  artery. No iliac deep venous thrombosis. The main portal, splenic, superior mesenteric veins are patent. No abdominal aorta or iliac aneurysm. At least moderate atherosclerotic plaque of the aorta and its branches. No abdominal, pelvic, or inguinal lymphadenopathy. Reproductive: Coarsely calcified uterine lesion grossly similar in size and appearance likely represents a uterine fibroid. Otherwise uterus and bilateral adnexa are unremarkable. Other: Possible slightly decreased small volume simple fluid ascites. No intraperitoneal free gas. No organized fluid collection. Musculoskeletal: Diffuse moderate subcutaneus soft tissue edema. No abdominal wall hernia or abnormality. No suspicious lytic or blastic osseous lesions. No acute displaced fracture. M Review of the MIP images confirms the above findings. IMPRESSION: 1. Bilateral lower lobe segmental and subsegmental pulmonary emboli. No associated right heart strain or pulmonary infarction. 2. Known nonocclusive left common femoral deep venous thrombosis extending into the deep and femoral veins. 3. Innumerable hepatic metastases. 4. Possibly slightly decreased small volume simple free fluid ascites. 5. Right chest wall Port-A-Cath with tip terminating at the inferior cavoatrial junction. Other imaging findings of potential clinical significance: 1. Scattered pulmonary micronodules bilaterally. 2. Indeterminate stable in size 1.2 cm left renal lesion. Recommend nonemergent MRI renal protocol for further evaluation. When the patient is clinically stable and able to follow directions and hold their breath (preferably as an outpatient) further evaluation with dedicated abdominal MRI should be considered. 3. Degenerative uterine fibroid. These results were called by telephone at the time of interpretation on 04/07/2021 at 7:21  pm to provider Davonna Belling , who verbally acknowledged these results. Electronically Signed   By: Iven Finn M.D.   On: 04/07/2021 19:27    US Venous Img Lower Unilateral Left  Result Date: 04/07/2021 CLINICAL DATA:  Left calf pain and swelling EXAM: Left LOWER EXTREMITY VENOUS DOPPLER ULTRASOUND TECHNIQUE: Gray-scale sonography with graded compression, as well as color Doppler and duplex ultrasound were performed to evaluate the lower extremity deep venous systems from the level of the common femoral vein and including the common femoral, femoral, profunda femoral, popliteal and calf veins including the posterior tibial, peroneal and gastrocnemius veins when visible. The superficial great saphenous vein was also interrogated. Spectral Doppler was utilized to evaluate flow at rest and with distal augmentation maneuvers in the common femoral, femoral and popliteal veins. COMPARISON:  None. FINDINGS: Contralateral Common Femoral Vein: Respiratory phasicity is normal and symmetric with the symptomatic side. No evidence of thrombus. Normal compressibility. Common Femoral Vein: Occlusive thrombus within the common femoral vein. Vein is noncompressible. Saphenofemoral Junction: Positive for occlusive thrombus. Vein is noncompressible Profunda Femoral Vein: Positive for near occlusive thrombus with some flow present. Partially compressible. Femoral Vein: Positive for occlusive thrombus. Vein is noncompressible Popliteal Vein: Positive for occlusive thrombus. Vein is noncompressible. Calf Veins: Positive for occlusive thrombus within the posterior tibial and peroneal veins. IMPRESSION: Positive for acute extensive occlusive thrombus extending from left common femoral vein to the calf veins. Critical Value/emergent results were called by telephone at the time of interpretation on 04/07/2021 at 5:51 pm to provider Davonna Belling , who verbally acknowledged these results. Electronically Signed   By: Donavan Foil M.D.   On: 04/07/2021 17:51   DG Chest Portable 1 View  Result Date: 04/07/2021 CLINICAL DATA:  Chest pain EXAM: PORTABLE CHEST 1 VIEW  COMPARISON:  03/30/2021 FINDINGS: Right-sided chest port is unchanged in positioning. Normal heart size. Low lung volumes. No focal airspace consolidation, pleural effusion, or pneumothorax. IMPRESSION: Low lung volumes.  No acute cardiopulmonary findings. Electronically Signed   By: Davina Poke D.O.   On: 04/07/2021 18:11   ECHOCARDIOGRAM COMPLETE  Result Date: 04/08/2021    ECHOCARDIOGRAM REPORT   Patient Name:   Rachel Frank Date of Exam: 04/08/2021 Medical Rec #:  [id number]    Height:       62.0 in Accession #:    [id number]   Weight:       164.7 lb Date of Birth:  [birth year]    BSA:          1.760 m Patient Age:    67 years     BP:           123/76 mmHg Patient Gender: F            HR:           99 bpm. Exam Location:  Inpatient Procedure: 2D Echo, Color Doppler and Cardiac Doppler Indications:    I26.02 Pulmonary embolus  History:        Patient has no prior history of Echocardiogram examinations.                 Risk Factors:Hypertension and Dyslipidemia.  Sonographer:    Raquel Sarna Senior RDCS Referring Phys: [id number] [place]  1. Left ventricular ejection fraction, by estimation, is 60 to 65%. The left ventricle has normal function. The left ventricle has no regional wall motion abnormalities. Left ventricular diastolic parameters are consistent with Grade I diastolic dysfunction (impaired relaxation).  2. Right  ventricular systolic function is normal. The right ventricular size is normal.  3. The mitral valve is abnormal. Mild mitral valve regurgitation. No evidence of mitral stenosis.  4. The aortic valve is tricuspid. There is mild calcification of the aortic valve. Aortic valve regurgitation is trivial. Mild aortic valve sclerosis is present, with no evidence of aortic valve stenosis.  5. The inferior vena cava is normal in size with greater than 50% respiratory variability, suggesting right atrial pressure of 3 mmHg. FINDINGS  Left Ventricle: Left ventricular ejection fraction,  by estimation, is 60 to 65%. The left ventricle has normal function. The left ventricle has no regional wall motion abnormalities. The left ventricular internal cavity size was normal in size. There is  no left ventricular hypertrophy. Left ventricular diastolic parameters are consistent with Grade I diastolic dysfunction (impaired relaxation). Right Ventricle: The right ventricular size is normal. No increase in right ventricular wall thickness. Right ventricular systolic function is normal. Left Atrium: Left atrial size was normal in size. Right Atrium: Right atrial size was normal in size. Pericardium: There is no evidence of pericardial effusion. Mitral Valve: The mitral valve is abnormal. There is mild thickening of the mitral valve leaflet(s). There is mild calcification of the mitral valve leaflet(s). Mild mitral valve regurgitation. No evidence of mitral valve stenosis. Tricuspid Valve: The tricuspid valve is normal in structure. Tricuspid valve regurgitation is not demonstrated. No evidence of tricuspid stenosis. Aortic Valve: The aortic valve is tricuspid. There is mild calcification of the aortic valve. Aortic valve regurgitation is trivial. Mild aortic valve sclerosis is present, with no evidence of aortic valve stenosis. Pulmonic Valve: The pulmonic valve was normal in structure. Pulmonic valve regurgitation is not visualized. No evidence of pulmonic stenosis. Aorta: The aortic root is normal in size and structure. Venous: The inferior vena cava is normal in size with greater than 50% respiratory variability, suggesting right atrial pressure of 3 mmHg. IAS/Shunts: No atrial level shunt detected by color flow Doppler.  LEFT VENTRICLE PLAX 2D LVIDd:         3.60 cm  Diastology LVIDs:         2.30 cm  LV e' medial:    7.18 cm/s LV PW:         0.70 cm  LV E/e' medial:  8.0 LV IVS:        0.70 cm  LV e' lateral:   9.36 cm/s LVOT diam:     1.80 cm  LV E/e' lateral: 6.1 LV SV:         45 LV SV Index:   25 LVOT  Area:     2.54 cm  RIGHT VENTRICLE RV S prime:     5.33 cm/s TAPSE (M-mode): 1.5 cm LEFT ATRIUM             Index       RIGHT ATRIUM          Index LA diam:        3.10 cm 1.76 cm/m  RA Area:     5.36 cm LA Vol (A2C):   32.6 ml 18.52 ml/m RA Volume:   6.54 ml  3.72 ml/m LA Vol (A4C):   24.8 ml 14.09 ml/m LA Biplane Vol: 30.5 ml 17.33 ml/m  AORTIC VALVE LVOT Vmax:   112.00 cm/s LVOT Vmean:  75.700 cm/s LVOT VTI:    0.176 m  AORTA Ao Root diam: 2.60 cm Ao Asc diam:  2.70 cm MITRAL VALVE MV Area (PHT): 2.93 cm  SHUNTS MV Decel Time: 259 msec      Systemic VTI:  0.18 m MR Peak grad:    128.1 mmHg  Systemic Diam: 1.80 cm MR Mean grad:    93.0 mmHg MR Vmax:         566.00 cm/s MR Vmean:        461.0 cm/s MR PISA:         0.57 cm MR PISA Eff ROA: 4 mm MR PISA Radius:  0.30 cm MV E velocity: 57.40 cm/s MV A velocity: 87.40 cm/s MV E/A ratio:  0.66 Jenkins Rouge MD Electronically signed by Jenkins Rouge MD Signature Date/Time: 04/08/2021/10:02:34 AM    Final       Scheduled Meds:  amLODipine  5 mg Oral Daily   apixaban  10 mg Oral BID   Followed by   Derrill Memo ON 04/14/2021] apixaban  5 mg Oral BID   Chlorhexidine Gluconate Cloth  6 each Topical Daily   hydrocortisone  25 mg Rectal BID   sodium chloride flush  10-40 mL Intracatheter Q12H   Continuous Infusions:  sodium chloride 250 mL (04/08/21 1712)     LOS: 2 days     Vernell Leep, MD, FACP, [facility]. [facility]  To contact the attending provider between 7A-7P or the covering provider during after hours 7P-7A, please log into the web site [email or web address] and access using universal [place] password for that web site. If you do not have the password, please call the hospital operator.

## 2021-04-09 NOTE — Plan of Care (Signed)
  Problem: Health Behavior/Discharge Planning: Goal: Ability to manage health-related needs will improve Outcome: Progressing   Problem: Clinical Measurements: Goal: Diagnostic test results will improve Outcome: Progressing   Problem: Pain Managment: Goal: General experience of comfort will improve Outcome: Progressing   Problem: Skin Integrity: Goal: Risk for impaired skin integrity will decrease Outcome: Progressing

## 2021-04-10 ENCOUNTER — Inpatient Hospital Stay: Payer: 59

## 2021-04-10 ENCOUNTER — Inpatient Hospital Stay: Payer: 59 | Admitting: Nurse Practitioner

## 2021-04-10 DIAGNOSIS — Z7189 Other specified counseling: Secondary | ICD-10-CM

## 2021-04-10 DIAGNOSIS — Z515 Encounter for palliative care: Secondary | ICD-10-CM | POA: Diagnosis not present

## 2021-04-10 DIAGNOSIS — I2699 Other pulmonary embolism without acute cor pulmonale: Secondary | ICD-10-CM | POA: Diagnosis not present

## 2021-04-10 DIAGNOSIS — C19 Malignant neoplasm of rectosigmoid junction: Secondary | ICD-10-CM | POA: Diagnosis not present

## 2021-04-10 LAB — COMPREHENSIVE METABOLIC PANEL
ALT: 14 U/L (ref 0–44)
AST: 55 U/L — ABNORMAL HIGH (ref 15–41)
Albumin: 1.4 g/dL — ABNORMAL LOW (ref 3.5–5.0)
Alkaline Phosphatase: 703 U/L — ABNORMAL HIGH (ref 38–126)
Anion gap: 6 (ref 5–15)
BUN: 24 mg/dL — ABNORMAL HIGH (ref 8–23)
CO2: 23 mmol/L (ref 22–32)
Calcium: 7.6 mg/dL — ABNORMAL LOW (ref 8.9–10.3)
Chloride: 96 mmol/L — ABNORMAL LOW (ref 98–111)
Creatinine, Ser: 0.78 mg/dL (ref 0.44–1.00)
GFR, Estimated: >60 mL/min (ref >60)
Glucose, Bld: 103 mg/dL — ABNORMAL HIGH (ref 70–99)
Potassium: 4.3 mmol/L (ref 3.5–5.1)
Sodium: 125 mmol/L — ABNORMAL LOW (ref 135–145)
Total Bilirubin: 1.8 mg/dL — ABNORMAL HIGH (ref 0.3–1.2)
Total Protein: 5.7 g/dL — ABNORMAL LOW (ref 6.5–8.1)

## 2021-04-10 LAB — CBC
HCT: 30.1 % — ABNORMAL LOW (ref 36.0–46.0)
Hemoglobin: 9.9 g/dL — ABNORMAL LOW (ref 12.0–15.0)
MCH: 30.1 pg (ref 26.0–34.0)
MCHC: 32.9 g/dL (ref 30.0–36.0)
MCV: 91.5 fL (ref 80.0–100.0)
Platelets: 255 10*3/uL (ref 150–400)
RBC: 3.29 MIL/uL — ABNORMAL LOW (ref 3.87–5.11)
RDW: 20.9 % — ABNORMAL HIGH (ref 11.5–15.5)
WBC: 4.8 10*3/uL (ref 4.0–10.5)
nRBC: 0 % (ref 0.0–0.2)

## 2021-04-10 MED ORDER — FUROSEMIDE 10 MG/ML IJ SOLN
40.0000 mg | Freq: Once | INTRAMUSCULAR | Status: AC
  Administered 2021-04-10: 40 mg via INTRAVENOUS
  Filled 2021-04-10: qty 4

## 2021-04-10 MED ORDER — ALBUMIN HUMAN 25 % IV SOLN
12.5000 g | Freq: Once | INTRAVENOUS | Status: AC
  Administered 2021-04-10: 12.5 g via INTRAVENOUS
  Filled 2021-04-10: qty 50

## 2021-04-10 MED ORDER — INFLUENZA VAC SPLIT QUAD 0.5 ML IM SUSY: 0.5000 mL | PREFILLED_SYRINGE | INTRAMUSCULAR | Status: DC

## 2021-04-10 NOTE — Progress Notes (Signed)
PROGRESS NOTE    Rachel Frank   LGX:[id number]  DOB: 1960-11-17  DOA: 04/07/2021 PCP: [name], MD   Brief Narrative:  Rachel Frank is a 60 y.o. married female with medical history significant for Stage 4 colon cancer followed by Oncology, HTN, HLD who presents with swelling of her legs that is worse on the left than the right. According to the patient and her spouse, she has steadily been losing her strength and is now having difficulty even walking to the bathroom.  She spends most of her day in bed.  She underwent a paracentesis last week which was therapeutic.  Her oncologist, Dr. Benay Spice told her last week that he would not be giving her any further chemotherapy.  Found to have a DVT of the left leg and bilateral PEs without any right heart strain.  Started on heparin infusion and admitted to the hospital.  She has been transitioned to Eliquis.  Palliative care consulted for goals of care.  Awaiting input from oncologist on 9/27.   Subjective: Overall feeling better.  Patient reported that she had some blood in the stools.  She was informed by nursing that it appeared hemorrhoidal.  However unable to get clarification from nursing regarding type of rectal bleeding and quantity.  Denies dyspnea.  Pain better.    Assessment & Plan:   Principal Problem:   Acute pulmonary embolism without acute cor pulmonale , left leg DVT in setting of metastatic cancer and severe deconditioning-generalized weakness Sinus tachycardia - According to the patient's spouse, she is mostly bedbound now -Lower extremity vascular ultrasound reveals acute extensive occlusive thrombosis extending from left common femoral vein to the calf veins. - TTE: LVEF 60-65%, no regional wall motion abnormalities, grade 1 diastolic dysfunction, RV systolic function and size normal. - Heparin has been switched to Eliquis - the patient states that she had bright red blood in her stool about a week ago she does have a  history of hemorrhoids but has not been constipated.  In fact she reports diarrhea for which she takes antimotility medications.  Initiated hydrocortisone suppositories for hemorrhoids. -Both patient and spouse understand to monitor stools and, if bleeding is noted, to stop Eliquis.  Discussed with nursing regarding monitoring closely for rectal bleeding and documented type and volume. - Not hypoxic and hemodynamically stable. - Overall extremely poor prognosis.  Await palliative care consultation and input from her primary oncologist who reportedly is off today and should be back 9/27 - I am informed by palliative care MD that one of their team members will be seeing patient today.  I communicated with her primary oncologist who will see her 9/28 AM.  Active Problems:    Colorectal cancer, stage IV with abdominal ascites -Last CEA level was 45,662 -She was due for chemotherapy last week however it was held by oncologist Dr. Benay Spice who also had a conversation with Ms. Tschirhart and her husband regarding her poor prognosis - She underwent a therapeutic paracentesis on 9/21, on the same day of her outpatient visit with oncology  - Nontense ascites, no indication for paracentesis at current time. -In addition to a paracentesis, Dr. Benay Spice recommended discontinuing HCTZ and spironolactone-he planned to follow-up with her on 04/10/2021, now will see tomorrow 9/28.    Hyponatremia -Related to third spacing and possibly poor oral intake of solute.  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 - It appears that her baseline is around 127-130 -She has moderate ascites today and 3+ pitting  edema in bilateral lower extremities -IV fluids discontinued. - Serum sodium dropped from 127 to 125.  Ordered but have not seen urine osmolarity, will check. - Unclear etiology?  Hypervolemic hyponatremia versus volume depletion despite third spacing versus SIADH. -Trial of 25% albumin, 12.5 g plus IV Lasix 40  mg x 1 and then consider torsemide 20 Mg daily.  Essential hypertension - Continue amlodipine.  Controlled.     Prolonged QT interval -QTC 491 on EKG on 9/24  Hypoalbuminemia, elevated alkaline phosphatase - Related to underlying cancer  Normocytic anemia - Hemoglobin stable in the high 9 g range for the last 2 days.  Hemorrhoids -Intermittent small-volume rectal bleeding at home.  Initiated hydrocortisone suppositories.    DVT prophylaxis:  apixaban (ELIQUIS) tablet 10 mg  apixaban (ELIQUIS) tablet 5 mg  Code Status: Full code Family Communication: Spouse at bedside today. Disposition Plan:  Status is: Inpatient  Remains inpatient appropriate because:Inpatient level of care appropriate due to severity of illness  Dispo: The patient is from: Home              Anticipated d/c is to: Home              Patient currently is not medically stable to d/c.   Difficult to place patient No      Consultants:  Palliative care medicine-pending Medical oncology-Dr. Benay Spice, in a.m.  Procedures:  None  Antimicrobials:  Anti-infectives (From admission, onward)    None        Objective: Vitals:   04/09/21 1150 04/09/21 2031 04/10/21 0500 04/10/21 1416  BP: (!) 141/90 (!) 138/93 (!) 137/91 126/83  Pulse: (!) 107 99 89 (!) 110  Resp: 16 17 16 16   Temp: 98 F (36.7 C) 98.5 F (36.9 C) 98.2 F (36.8 C) 98.2 F (36.8 C)  TempSrc: Oral Oral Oral Oral  SpO2: 100% 100% 99% 100%  Weight:      Height:        Intake/Output Summary (Last 24 hours) at 04/10/2021 1607 Last data filed at 04/10/2021 1500 Gross per 24 hour  Intake 709.29 ml  Output 750 ml  Net -40.71 ml   Filed Weights   04/07/21 1449 04/07/21 2214  Weight: 76 kg 74.7 kg    Examination: General exam: Middle-age female, moderately built and poorly nourished, looks older than stated age, lying comfortably propped up in bed.  Not hypoxic on room air.  Overall looks better and Respiratory system: Admits  breath sounds in the bases but otherwise clear to auscultation.  No increased work of breathing.  O2 controlled. Cardiovascular system: S1 and S2 heard, RRR.  No JVD, murmurs.  2+ pitting bilateral leg edema.  Telemetry personally reviewed: Gastrointestinal system: Abdomen soft, non-tender, moderately distended. Normal bowel sounds.  SR in the 90s-ST in the 100s. Central nervous system: Alert and oriented. No focal neurological deficits. Extremities: No cyanosis, clubbing 2+ pitting edema in both lower extremities, left greater than sign right. Skin: No rashes or ulcers Psychiatry:  Mood & affect flat    Data Reviewed: I have personally reviewed following labs and imaging studies  CBC: Recent Labs  Lab 04/04/21 0939 04/07/21 1640 04/08/21 0122 04/10/21 0352  WBC 3.6* 4.0 4.8 4.8  NEUTROABS 1.4* 1.7  --   --   HGB 10.6* 10.5* 9.8* 9.9*  HCT 33.1* 32.3* 29.9* 30.1*  MCV 91.7 90.0 91.2 91.5  PLT 252 243 243 902   Basic Metabolic Panel: Recent Labs  Lab 04/04/21 0939 04/07/21 1640  04/08/21 0122 04/09/21 0410 04/10/21 0352  NA 127* 124* 128* 127* 125*  K 4.6 4.4 4.6 4.7 4.3  CL 95* 94* 98 100 96*  CO2 20* 22 20* 20* 23  GLUCOSE 130* 100* 92 77 103*  BUN 40* 30* 30* 24* 24*  CREATININE 1.49* 0.90 1.04* 0.95 0.78  CALCIUM 8.3* 7.8* 7.6* 7.8* 7.6*   GFR: Estimated Creatinine Clearance: 68.9 mL/min (by C-G formula based on SCr of 0.78 mg/dL). Liver Function Tests: Recent Labs  Lab 04/04/21 0939 04/07/21 1640 04/10/21 0352  AST 47* 40 55*  ALT 13 11 14   ALKPHOS 774* 619* 703*  BILITOT 3.6* 2.6* 1.8*  PROT 6.1* 5.7* 5.7*  ALBUMIN 2.2* 2.0* 1.4*   Coagulation Profile: Recent Labs  Lab 04/07/21 2230  INR 1.9*   Recent Results (from the past 240 hour(s))  Resp Panel by RT-PCR (Flu A&B, Covid) Nasopharyngeal Swab     Status: None   Collection Time: 04/07/21  6:43 PM   Specimen: Nasopharyngeal Swab; Nasopharyngeal(NP) swabs in vial transport medium  Result Value  Ref Range Status   SARS Coronavirus 2 by RT PCR NEGATIVE NEGATIVE Final    Comment: (NOTE) SARS-CoV-2 target nucleic acids are NOT DETECTED.  The SARS-CoV-2 RNA is generally detectable in upper respiratory specimens during the acute phase of infection. The lowest concentration of SARS-CoV-2 viral copies this assay can detect is 138 copies/mL. A negative result does not preclude SARS-Cov-2 infection and should not be used as the sole basis for treatment or other patient management decisions. A negative result may occur with  improper specimen collection/handling, submission of specimen other than nasopharyngeal swab, presence of viral mutation(s) within the areas targeted by this assay, and inadequate number of viral copies(<138 copies/mL). A negative result must be combined with clinical observations, patient history, and epidemiological information. The expected result is Negative.  Fact Sheet for Patients:  EntrepreneurPulse.com.au  Fact Sheet for Healthcare Providers:  IncredibleEmployment.be  This test is no t yet approved or cleared by the [place] FDA and  has been authorized for detection and/or diagnosis of SARS-CoV-2 by FDA under an Emergency Use Authorization (EUA). This EUA will remain  in effect (meaning this test can be used) for the duration of the COVID-19 declaration under Section 564(b)(1) of the Act, 21 U.S.C.section 360bbb-3(b)(1), unless the authorization is terminated  or revoked sooner.       Influenza A by PCR NEGATIVE NEGATIVE Final   Influenza B by PCR NEGATIVE NEGATIVE Final    Comment: (NOTE) The Xpert Xpress SARS-CoV-2/FLU/RSV plus assay is intended as an aid in the diagnosis of influenza from Nasopharyngeal swab specimens and should not be used as a sole basis for treatment. Nasal washings and aspirates are unacceptable for Xpert Xpress SARS-CoV-2/FLU/RSV testing.  Fact Sheet for  Patients: EntrepreneurPulse.com.au  Fact Sheet for Healthcare Providers: IncredibleEmployment.be  This test is not yet approved or cleared by the [place] FDA and has been authorized for detection and/or diagnosis of SARS-CoV-2 by FDA under an Emergency Use Authorization (EUA). This EUA will remain in effect (meaning this test can be used) for the duration of the COVID-19 declaration under Section 564(b)(1) of the Act, 21 U.S.C. section 360bbb-3(b)(1), unless the authorization is terminated or revoked.  Performed at [facility], [address], [place], [place] [address]          Radiology Studies: No results found.    Scheduled Meds:  amLODipine  5 mg Oral Daily   apixaban  10  mg Oral BID   Followed by   Derrill Memo ON 04/14/2021] apixaban  5 mg Oral BID   Chlorhexidine Gluconate Cloth  6 each Topical Daily   hydrocortisone  25 mg Rectal BID   [START ON 04/11/2021] influenza vac split quadrivalent PF  0.5 mL Intramuscular Tomorrow-1000   sodium chloride flush  10-40 mL Intracatheter Q12H   Continuous Infusions:  sodium chloride 250 mL (04/08/21 1712)     LOS: 3 days     Vernell Leep, MD, [place], [facility]. [facility]  To contact the attending provider between 7A-7P or the covering provider during after hours 7P-7A, please log into the web site [email or web address] and access using universal [place] password for that web site. If you do not have the password, please call the hospital operator.

## 2021-04-10 NOTE — Consult Note (Addendum)
Consultation Note Date: 04/10/2021   Patient Name: Rachel Frank  DOB: 11/02/1960  MRN: [id number]  Age / Sex: 60 y.o., female  PCP: [name], MD Referring Physician: Modena Jansky, MD  Reason for Consultation: Goals of care  HPI/Patient Profile: 60 y.o. 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 Workup reveals DVT, and PE.  Palliative medicine consulted for goals of care.    Primary Decision Maker PATIENT  Discussion: I met at the bedside with the patient and her daughter, Rachel Frank.  Ms. Harries is retired from working at [employer]. She derives joy in spending time with her family.  She lives at home with her spouse. She has two daughters who live close by and are very supportive of her. She is rarely left at home alone.  She is mostly bedbound.   We discussed her current illness, end of life wishes and advanced directives.  She and her daughter share that they understand the goal of her initial treatment was to control the cancer but ultimately would not be able to cure it. We discussed how typically cancer treatments proceed- treating with one regiment until cancer progresses through that regimen until all regimens have been exhausted or until a patient's functional status is prohibiting them from benefiting from the cancer treatment or their quality of life is one that they would no longer wish to live.  Ms. Eichhorn and Rachel Frank share they are under the impression the her Frankey Poot was being held- but they weren't certain if her Avastin would be continued.  If further treatment were offered then she would want to accept it.  However, if treatment were not going  to be offered then she is agreeable to comfort care and receiving support with Hospice.  We discussed Advanced care planning. She would prefer to remain at home with support as long as possible. She does not want to die in her home- she would prefer to go to a Hospice house for her last days.  She asked about her prognosis- she said she would be surprised if she was told she only had two weeks to live. I shared my concern that I would not be surprised if she were not here with [place] in 3 or 6 months. She noted that she did not disagree with this and Rachel Frank also agreed.  We discussed her code status- for now she wishes to remain full code. We discussed reasons why she may want to reconsider this and discuss with her family- primarily that she has a terminal illness and if she were to deteriorate to the point of requiring CPR that she might be abe to recover heart beat, however, would only be left further debilitated on life support and to either die again or her family would have to make decision to  remove life support.  She and Rachel Frank agreed that they need to hear from Dr. Benay Spice regarding his recommendations before making further decisions.       SUMMARY OF RECOMMENDATIONS -Patient and family are awaiting input from Dr. Benay Spice regarding possibility for further chemotherapy -They are agreeable to Hospice support if no further chemotherapy will be offered  -Plan to meet tomorrow at 11am after Dr. Benay Spice has seen them to further discuss [place] and [place]    Code Status/Advance Care Planning: Full code   Prognosis:   < 6 months  Discharge Planning: To Be Determined  Primary Diagnoses: Present on Admission:  Acute pulmonary embolism without acute cor pulmonale (HCC)  Essential hypertension  Colorectal cancer, stage IV (HCC)   Review of Systems  Physical Exam  Vital Signs: BP 126/83 (BP Location: Left Arm)   Pulse (!) 110   Temp 98.2 F (36.8 C) (Oral)   Resp 16   Ht _0  (1.575 m)    Wt 74.7 kg   SpO2 100%   BMI 30.12 kg/m  Pain Scale: 0-10   Pain Score: 3    SpO2: SpO2: 100 % O2 Device:SpO2: 100 % O2 Flow Rate: .   IO: Intake/output summary:  Intake/Output Summary (Last 24 hours) at 04/10/2021 1506 Last data filed at 04/10/2021 0600 Gross per 24 hour  Intake 839.78 ml  Output 750 ml  Net 89.78 ml    LBM: Last BM Date: 04/10/21 Baseline Weight: Weight: 76 kg Most recent weight: Weight: 74.7 kg     Palliative Assessment/Data:       Thank you for this consult. Palliative medicine will continue to follow and assist as needed.   Time In: 1410 Time Out: 1526 Time Total: 71 mins Greater than 50%  of this time was spent counseling and coordinating care related to the above assessment and plan.  Signed by: Mariana Kaufman, AGNP-C Palliative Medicine    Please contact Palliative Medicine Team phone at [phone] for questions and concerns.  For individual provider: See Shea Evans

## 2021-04-10 NOTE — TOC Transition Note (Signed)
Transition of Care [facility]) - CM/SW Discharge Note   Patient Details  Name: Rachel Frank MRN: [id number] Date of Birth: October 31, 1960  [facility] of [facility] CM/SW Contact:  Dessa Phi, RN Phone Number: 04/10/2021, 1:05 PM   Clinical Narrative:   d/c plan home w/HHC-[facility] rep Malachy Mood following for HHRN/PT;[facility] rep Zach following for dme delivery to rm prior d/c-3n1,rollator. Family to transport home.    Final next level of care: [place] Barriers to Discharge: Continued Medical Work up   Patient Goals and CMS Choice Patient states their goals for this hospitalization and ongoing recovery are:: go home CMS Medicare.gov Compare Post Acute Care list provided to:: Patient Represenative (must comment) Rachel Frank spouse [phone]) Choice offered to / list presented to : Spouse  Discharge Placement                       Discharge Plan and Services   Discharge Planning Services: CM Consult Post Acute Care Choice: Durable Medical Equipment, Home Health          DME Arranged: 3-N-1, Walker rolling with seat DME Agency: [facility] Date DME Agency Contacted: 04/10/21 Time DME Agency Contacted: [phone] Representative spoke with at DME Agency: Thedore Mins HH Arranged: RN, PT [place] Agency: [place] Date [place]: 04/10/21 Time [place]: [phone] Representative spoke with at [place]: [place] ([place]) Interventions     Readmission Risk Interventions No flowsheet data found.

## 2021-04-11 DIAGNOSIS — C19 Malignant neoplasm of rectosigmoid junction: Secondary | ICD-10-CM | POA: Diagnosis not present

## 2021-04-11 DIAGNOSIS — C787 Secondary malignant neoplasm of liver and intrahepatic bile duct: Secondary | ICD-10-CM

## 2021-04-11 DIAGNOSIS — I10 Essential (primary) hypertension: Secondary | ICD-10-CM | POA: Diagnosis not present

## 2021-04-11 DIAGNOSIS — C189 Malignant neoplasm of colon, unspecified: Secondary | ICD-10-CM

## 2021-04-11 DIAGNOSIS — Z7189 Other specified counseling: Secondary | ICD-10-CM | POA: Diagnosis not present

## 2021-04-11 DIAGNOSIS — I2699 Other pulmonary embolism without acute cor pulmonale: Secondary | ICD-10-CM | POA: Diagnosis not present

## 2021-04-11 DIAGNOSIS — I82412 Acute embolism and thrombosis of left femoral vein: Secondary | ICD-10-CM | POA: Diagnosis not present

## 2021-04-11 LAB — CBC
HCT: 31.1 % — ABNORMAL LOW (ref 36.0–46.0)
Hemoglobin: 10.1 g/dL — ABNORMAL LOW (ref 12.0–15.0)
MCH: 30.1 pg (ref 26.0–34.0)
MCHC: 32.5 g/dL (ref 30.0–36.0)
MCV: 92.8 fL (ref 80.0–100.0)
Platelets: 279 10*3/uL (ref 150–400)
RBC: 3.35 MIL/uL — ABNORMAL LOW (ref 3.87–5.11)
RDW: 20.9 % — ABNORMAL HIGH (ref 11.5–15.5)
WBC: 5.3 10*3/uL (ref 4.0–10.5)
nRBC: 0 % (ref 0.0–0.2)

## 2021-04-11 LAB — BASIC METABOLIC PANEL
Anion gap: 9 (ref 5–15)
BUN: 20 mg/dL (ref 8–23)
CO2: 21 mmol/L — ABNORMAL LOW (ref 22–32)
Calcium: 8.2 mg/dL — ABNORMAL LOW (ref 8.9–10.3)
Chloride: 99 mmol/L (ref 98–111)
Creatinine, Ser: 0.93 mg/dL (ref 0.44–1.00)
GFR, Estimated: >60 mL/min (ref >60)
Glucose, Bld: 96 mg/dL (ref 70–99)
Potassium: 4.1 mmol/L (ref 3.5–5.1)
Sodium: 129 mmol/L — ABNORMAL LOW (ref 135–145)

## 2021-04-11 NOTE — Progress Notes (Signed)
PROGRESS NOTE    Rachel Frank  MEQ:[id number] DOB: 05-09-1961 DOA: 04/07/2021 PCP: [name], MD   Brief Narrative: Rachel Frank is a 59 y.o. female with a history of stage IV colon cancer with liver metastasis, hypertension, hyperlipidemia.  Patient presented secondary to lower extremity swelling and found to have evidence of a left leg DVT in addition to bilateral PEs.  No right heart strain.  Patient was managed on IV heparin and was transitioned to Eliquis.  While admitted, goals of care discussions led to patient being transition to home with hospice.   Assessment & Plan:   Principal Problem:   Acute pulmonary embolism without acute cor pulmonale (HCC) Active Problems:   Essential hypertension   Colorectal cancer, stage IV (HCC)   Acute deep vein thrombosis (DVT) of femoral vein of left lower extremity (HCC)   Prolonged QT interval   Hyponatremia   Acute pulmonary embolism Left leg DVT Likely unprovoked secondary to known metastatic cancer. No right heart strain on Transthoracic Echocardiogram. Patient initially managed on Heparin IV which was transitioned to Eliquis. Complicated by hemorrhoids and history of bright red blood in her stool, but risks were discussed and decision made for anticoagulation. -Continue Eliquis indefinitely  Stage IV colorectal cancer Abdominal ascites Liver metastasis Palliative care and oncology on board. Plan is for home with hospice on discharge. Palliative paracentesis ordered prior to discharge.  Hyponatremia Baseline of around 129-130 more recently. Likely secondary to third spacing but could be a component of SIADH. Improvement with albumin and Lasix. Currently stable. Previously on Lasix as an outpatient which was complicated by hypotension.  Primary hypertension -Continue amlodipine  Prolonged QTc Last QTc of 491 ms  Hypoalbuminemia Elevated ALP Secondary to known cancer.  Normocytic anemia Chronic. Hemoglobin currently  stable while on anticoagulation.  Hemorrhoids Intermittent rectal bleeding noted from this. In setting of Eliquis, risks discussed with patient.   DVT prophylaxis: Eliquis Code Status:   Code Status: Full Code Family Communication: Husband at bedside Disposition Plan: Discharge home in 24 hours pending palliative paracentesis prior to discharge per oncology   Consultants:  Palliative care medicine Medical oncology  Procedures:  None  Antimicrobials: None    Subjective: No significant issues overnight. Some abdominal discomfort.   Objective: Vitals:   04/10/21 0500 04/10/21 1416 04/10/21 2221 04/11/21 0523  BP: (!) 137/91 126/83 130/85 (!) 131/97  Pulse: 89 (!) 110 (!) 107 (!) 101  Resp: 16 16 17 15   Temp: 98.2 F (36.8 C) 98.2 F (36.8 C) 98.2 F (36.8 C) 97.7 F (36.5 C)  TempSrc: Oral Oral Oral Oral  SpO2: 99% 100% 100% 99%  Weight:      Height:        Intake/Output Summary (Last 24 hours) at 04/11/2021 0804 Last data filed at 04/11/2021 0525 Gross per 24 hour  Intake 309.68 ml  Output 725 ml  Net -415.32 ml   Filed Weights   04/07/21 1449 04/07/21 2214  Weight: 76 kg 74.7 kg    Examination:  General exam: Appears calm and comfortable  Respiratory system: Clear to auscultation. Respiratory effort normal. Cardiovascular system: S1 & S2 heard, RRR. Gastrointestinal system: Abdomen is distended, soft and slightly tender. Normal bowel sounds heard. Central nervous system: Alert and oriented. No focal neurological deficits. Musculoskeletal: BLE pitting edema. No calf tenderness Skin: No cyanosis. No rashes Psychiatry: Judgement and insight appear normal. Mood & affect appropriate.     Data Reviewed: I have personally reviewed following labs and imaging  studies  CBC Lab Results  Component Value Date   WBC 5.3 04/11/2021   RBC 3.35 (L) 04/11/2021   HGB 10.1 (L) 04/11/2021   HCT 31.1 (L) 04/11/2021   MCV 92.8 04/11/2021   MCH 30.1 04/11/2021    PLT 279 04/11/2021   MCHC 32.5 04/11/2021   RDW 20.9 (H) 04/11/2021   LYMPHSABS 1.1 04/07/2021   MONOABS 1.2 (H) 04/07/2021   EOSABS 0.0 04/07/2021   BASOSABS 0.0 70/96/2836     Last metabolic panel Lab Results  Component Value Date   NA 129 (L) 04/11/2021   K 4.1 04/11/2021   CL 99 04/11/2021   CO2 21 (L) 04/11/2021   BUN 20 04/11/2021   CREATININE 0.93 04/11/2021   GLUCOSE 96 04/11/2021   GFRNONAA >60 04/11/2021   GFRAA >60 03/30/2020   CALCIUM 8.2 (L) 04/11/2021   PROT 5.7 (L) 04/10/2021   ALBUMIN 1.4 (L) 04/10/2021   BILITOT 1.8 (H) 04/10/2021   ALKPHOS 703 (H) 04/10/2021   AST 55 (H) 04/10/2021   ALT 14 04/10/2021   ANIONGAP 9 04/11/2021    CBG (last 3)  No results for input(s): GLUCAP in the last 72 hours.   GFR: Estimated Creatinine Clearance: 59.3 mL/min (by C-G formula based on SCr of 0.93 mg/dL).  Coagulation Profile: Recent Labs  Lab 04/07/21 2230  INR 1.9*    Recent Results (from the past 240 hour(s))  Resp Panel by RT-PCR (Flu A&B, Covid) Nasopharyngeal Swab     Status: None   Collection Time: 04/07/21  6:43 PM   Specimen: Nasopharyngeal Swab; Nasopharyngeal(NP) swabs in vial transport medium  Result Value Ref Range Status   SARS Coronavirus 2 by RT PCR NEGATIVE NEGATIVE Final    Comment: (NOTE) SARS-CoV-2 target nucleic acids are NOT DETECTED.  The SARS-CoV-2 RNA is generally detectable in upper respiratory specimens during the acute phase of infection. The lowest concentration of SARS-CoV-2 viral copies this assay can detect is 138 copies/mL. A negative result does not preclude SARS-Cov-2 infection and should not be used as the sole basis for treatment or other patient management decisions. A negative result may occur with  improper specimen collection/handling, submission of specimen other than nasopharyngeal swab, presence of viral mutation(s) within the areas targeted by this assay, and inadequate number of viral copies(<138 copies/mL).  A negative result must be combined with clinical observations, patient history, and epidemiological information. The expected result is Negative.  Fact Sheet for Patients:  EntrepreneurPulse.com.au  Fact Sheet for Healthcare Providers:  IncredibleEmployment.be  This test is no t yet approved or cleared by the [place] FDA and  has been authorized for detection and/or diagnosis of SARS-CoV-2 by FDA under an Emergency Use Authorization (EUA). This EUA will remain  in effect (meaning this test can be used) for the duration of the COVID-19 declaration under Section 564(b)(1) of the Act, 21 U.S.C.section 360bbb-3(b)(1), unless the authorization is terminated  or revoked sooner.       Influenza A by PCR NEGATIVE NEGATIVE Final   Influenza B by PCR NEGATIVE NEGATIVE Final    Comment: (NOTE) The Xpert Xpress SARS-CoV-2/FLU/RSV plus assay is intended as an aid in the diagnosis of influenza from Nasopharyngeal swab specimens and should not be used as a sole basis for treatment. Nasal washings and aspirates are unacceptable for Xpert Xpress SARS-CoV-2/FLU/RSV testing.  Fact Sheet for Patients: EntrepreneurPulse.com.au  Fact Sheet for Healthcare Providers: IncredibleEmployment.be  This test is not yet approved or cleared by the [place] and  has been authorized for detection and/or diagnosis of SARS-CoV-2 by FDA under an Emergency Use Authorization (EUA). This EUA will remain in effect (meaning this test can be used) for the duration of the COVID-19 declaration under Section 564(b)(1) of the Act, 21 U.S.C. section 360bbb-3(b)(1), unless the authorization is terminated or revoked.  Performed at [facility], [address], [place], [place] [address]         Radiology Studies: No results found.      Scheduled Meds:  amLODipine  5 mg Oral Daily   apixaban  10 mg Oral  BID   Followed by   Derrill Memo ON 04/14/2021] apixaban  5 mg Oral BID   Chlorhexidine Gluconate Cloth  6 each Topical Daily   hydrocortisone  25 mg Rectal BID   influenza vac split quadrivalent PF  0.5 mL Intramuscular Tomorrow-1000   sodium chloride flush  10-40 mL Intracatheter Q12H   Continuous Infusions:  sodium chloride 250 mL (04/08/21 1712)     LOS: 4 days     Cordelia Poche, MD [facility] 04/11/2021, 8:04 AM  If 7PM-7AM, please contact night-coverage [email or web address]

## 2021-04-11 NOTE — Progress Notes (Signed)
Daily Progress Note   Patient Name: Rachel Frank       Date: 04/11/2021 DOB: 1961-03-01  Age: 60 y.o. MRN#: [id number] Attending Physician: Mariel Aloe, MD Primary Care Physician: [name], MD Admit Date: 04/07/2021  Reason for Consultation/Follow-up: Establishing goals of care  Patient Profile/HPI:  60 y.o. 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 Workup reveals DVT, and PE.  Palliative medicine consulted for goals of care.  Subjective: Met with patient and her spouse, [name]. They met with Dr. Benay Spice this morning.  [name] tells me he continues to have hope that Dr. Benay Spice will find another treatment for [facility]. However, he is accepting of DNR and receiving Hospice support at home. He notes that he doesn't feel there has been a dramatic change in her status. We discussed Dr. Gearldine Shown recommendations for no further chemotherapy at this time and the likelihood that without chemotherapy her cancer will continue to progress and he will start to see more changes over time.  Trent is at peace with her illness. She doesn't have any complaints. She notes her pain is well controlled with current interventions.   Review of Systems  Constitutional:  Positive for malaise/fatigue.  Neurological:  Positive for weakness.    Physical Exam Vitals and nursing note reviewed.  Musculoskeletal:     Left lower leg: Edema present.  Neurological:     Mental Status: She is alert and oriented to person, place, and time.            Vital Signs: BP (!)  131/97 (BP Location: Left Arm)   Pulse (!) 101   Temp 97.7 F (36.5 C) (Oral)   Resp 15   Ht $R'5\' 2"'mG$  (1.575 m)   Wt 74.7 kg   SpO2 99%   BMI 30.12 kg/m  SpO2: SpO2: 99 % O2 Device: O2 Device: Room Air O2 Flow Rate:    Intake/output summary:  Intake/Output Summary (Last 24 hours) at 04/11/2021 1203 Last data filed at 04/11/2021 0525 Gross per 24 hour  Intake 309.68 ml  Output 725 ml  Net -415.32 ml  LBM: Last BM Date: 04/10/21 Baseline Weight: Weight: 76 kg Most recent weight: Weight: 74.7 kg       Palliative Assessment/Data: PPS: 20%      Patient Active Problem List   Diagnosis Date Noted  . Acute pulmonary embolism without acute cor pulmonale ([place]) 04/07/2021  . Acute deep vein thrombosis (DVT) of femoral vein of left lower extremity ([place]) 04/07/2021  . Prolonged QT interval 04/07/2021  . Hyponatremia 04/07/2021  . Colorectal cancer, stage IV ([place]) 10/11/2019  . Goals of care, counseling/discussion 10/11/2019  . Iron deficiency anemia 09/01/2019  . Adenomatous polyp of cecum 09/01/2019  . Anemia 08/20/2019  . Hyperlipidemia 03/27/2009  . Essential hypertension 01/12/2007  . HEART MURMUR, HX OF 01/12/2007    Palliative Care Assessment & Plan    Assessment/Recommendations/Plan  TOC referral to arrange Hospice at home Continue current pain regimen- patient feels this is helping her greatly DNR gold form completed and placed on chart    Code Status: DNR  Prognosis:  < 6 months  Discharge Planning: Home with Hospice  Care plan was discussed with patient, her spouse, and care team.   Thank you for allowing the Palliative Medicine Team to assist in the care of this patient.  Total time:  39 mins Prolonged billing: No     Greater than 50%  of this time was spent counseling and coordinating care related to the above assessment and plan.  Mariana Kaufman, AGNP-C Palliative Medicine   Please contact Palliative Medicine Team phone at [phone] for  questions and concerns.

## 2021-04-11 NOTE — TOC Progression Note (Signed)
Transition of Care [facility]) - Progression Note    Patient Details  Name: Rachel Frank MRN: [id number] Date of Birth: Mar 27, 1961  Transition of Care [employer]) CM/SW Contact  Anjalee Cope, Juliann Pulse, RN Phone Number: 04/11/2021, 12:49 PM  Clinical Narrative: spoke to Rozell Searing about referral for home w/hospice-in agreement-Referral for home w/hospice-[facility] referral sent-rep Chrslyn aware. Contacted [facility]-rep Thedore Mins who has already delivered rw,3n1-they will follow for appropriate billing. Family able to transport home on own.     Expected Discharge Plan: Home w Hospice Care Barriers to Discharge: Continued Medical Work up  Expected Discharge Plan and Services Expected Discharge Plan: [place]   Discharge Planning Services: CM Consult Post Acute Care Choice: Durable Medical Equipment, Home Health Living arrangements for the past 2 months: Single Family Home                 DME Arranged: 3-N-1, Walker rolling with seat DME Agency: [facility] Date DME Agency Contacted: 04/10/21 Time DME Agency Contacted: 0626 Representative spoke with at DME Agency: Thedore Mins HH Arranged: RN, PT [facility] Agency: Hospice and [place] Date [place]: 04/10/21 Time [place]: 1304 Representative spoke with at [place]: [place] ([place]) Interventions    Readmission Risk Interventions Readmission Risk Prevention Plan 04/10/2021  Transportation Screening Complete  PCP or Specialist Appt within 3-5 Days Complete  HRI or [place] Complete  Social Work Consult for [employer] Planning/Counseling Complete  Palliative Care Screening Complete  Medication Review Press photographer) Complete  Some recent data might be hidden

## 2021-04-11 NOTE — Progress Notes (Signed)
IP PROGRESS NOTE  Subjective:   [name] is well-known to me with a history of metastatic colon cancer.  She presented to the emergency room on 04/07/2021 with left leg swelling and pain.  A Doppler confirmed an extensive left lower extremity DVT.  A chest CT revealed bilateral pulmonary emboli.  She was admitted and placed on anticoagulation therapy.  She reports the leg pain and swelling have improved.    Objective: Vital signs in last 24 hours: Blood pressure (!) 131/97, pulse (!) 101, temperature 97.7 F (36.5 C), temperature source Oral, resp. rate 15, height $RemoveBe'5\' 2"'PdEFQkzUw$  (1.575 m), weight 164 lb 10.9 oz (74.7 kg), SpO2 99 %.  Intake/Output from previous day: 09/27 0701 - 09/28 0700 In: 309.7 [P.O.:120; I.V.:189.7] Out: 725 [Urine:725]  Physical Exam:  HEENT: No thrush Lungs: Clear bilaterally Cardiac: Regular rate and rhythm Abdomen: Distended with ascites, no hepatomegaly Extremities: Trace edema at the lower leg bilaterally   Portacath/PICC-without erythema  Lab Results: Recent Labs    04/10/21 0352 04/11/21 0307  WBC 4.8 5.3  HGB 9.9* 10.1*  HCT 30.1* 31.1*  PLT 255 279    BMET Recent Labs    04/10/21 0352 04/11/21 0307  NA 125* 129*  K 4.3 4.1  CL 96* 99  CO2 23 21*  GLUCOSE 103* 96  BUN 24* 20  CREATININE 0.78 0.93  CALCIUM 7.6* 8.2*    Lab Results  Component Value Date   CEA1 33,343.02 (H) 12/12/2020   CEA 45,662.00 (H) 03/21/2021    Studies/Results: No results found.  Medications: I have reviewed the patient's current medications.  Assessment/Plan: Stage IV adenocarcinoma of the cecum. She presented with a large cecal mass, extensive hepatic metastatic disease; mild thoracic and abdominal adenopathy. She also has several small nonspecific pulmonary nodules. She was diagnosed in March 2021. Colonoscopy 09/16/2019-fungating mass at the cecum, sessile polyps in the this sigmoid, transverse colon, and hepatic flexure, adenocarcinoma involving cecum  biopsy, tubular adenoma and sessile serrated polyps CTs chest, abdomen, and pelvis 10/08/2019-extensive liver metastases, thoracic/abdominal adenopathy, cecal mass, dilated and thin thickened appendix, nonspecific pulmonary nodules Ultrasound-guided liver biopsy 10/13/2019-adenocarcinoma consistent with a metastatic colorectal primary, MSS, tumor mutation burden 3, KRAS Q61, Cycle 1 FOLFOX 10/14/2019 Cycle 2 FOLFOX 10/27/2019 Cycle 3 FOLFOX 11/11/2019, Emend and Decadron added, 5-FU dose escalated Cycle 4 FOLFOX 11/25/2019, Udenyca added Cycle 5 FOLFOX 12/08/2019 Cycle 6 FOLFOX 12/23/2019 CT 01/04/2020-decreased size of multiple hepatic metastases, slight decrease in bulk of cecal mass, stable ileocecal adenopathy Cycle 7 FOLFOX 01/06/2020 Cycle 8 FOLFOX 01/31/2020 Cycle 9 FOLFOX 02/17/2020 (oxaliplatin dose reduced due to borderline ANC, persistent cold sensitivity) Cycle 10 FOLFOX 03/02/2020 Cycle 11 FOLFOX 03/16/2020 Cycle 12 FOLFOX 03/30/2020 (oxaliplatin held secondary to neuropathy) CT abdomen/pelvis 04/19/2020-no significant change in the abnormal soft tissue lesion in the region of the cecal tip involving the ileocecal valve and appendiceal orifice; slight interval progression of numerous hepatic metastases; similar appearance of small lymph nodes in the ileocolic mesentery with adjacent irregular focus of soft tissue also stable Cycle 1 FOLFIRI/Avastin 04/25/2020 Cycle 2 FOLFIRI/Avastin 05/11/2020 Cycle 3 FOLFIRI 05/25/2020 (Avastin held due to proteinuria) Cycle 4 FOLFIRI/Avastin 06/07/2020 Cycle 5 FOLFIRI/Avastin 06/22/2020 CTs 07/05/2020-mild improvement in multifocal liver metastases.  Mass at the base of the cecum is stable.  Persistent thickening of the appendix. Cycle 6 FOLFIRI/Avastin 07/05/2020 Cycle 7 FOLFIRI/Avastin 07/20/2020 Cycle 8 FOLFIRI/Avastin 08/03/2020 Cycle 9 FOLFIRI 09/05/2020, Avastin held secondary to proteinuria CT chest 09/05/2020-negative for pulmonary embolism, no acute  disease, unchanged small pulmonary  nodules Cycle 10 FOLFIRI 09/20/2020, Avastin held secondary to proteinuria Cycle 11 FOLFIRI 10/04/2020, Avastin held secondary to proteinuria CTs 10/17/2020-increase in size of liver metastases, stable cecal mass, slight decrease in size of right lower quadrant mesenteric lymph node Cycle 12 FOLFIRI 10/18/2020, Avastin held secondary to proteinuria Cycle 1 FOLFOX/Avastin 11/13/2020 Cycle 2 FOLFOX/Avastin 11/27/2020 Cycle 3 FOLFOX/Avastin 12/12/2020 Cycle 4 FOLFOX/Avastin 12/26/2020 CT 01/04/2021-bulky confluent liver metastases, some are larger and others are stable, increased ascites with suggestion of peritoneal thickening and nodularity, unchanged cecal mass 01/09/2021-every 2-week Avastin continued Cycle 1 Lonsurf 01/15/2021 Therapeutic paracentesis 01/18/2021, 01/24/2021-cytology negative Cycle 2 Lonsurf 02/12/2021 CT abdomen/pelvis 03/02/2021-diffuse hepatic metastases, not significantly changed, large volume ascites, peritoneal thickening and enhancement suspicious for carcinomatosis Cycle 3 Lonsurf 03/12/2021 2) Iron deficiency anemia secondary to #1.  3.  Pain secondary to #1-improved 4.  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  Diarrhea 01/20/2020.  C. difficile negative 7.  COVID-19 infection 08/13/2020, antibody infusion therapy on 08/19/2020 8.  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  Inflamed left labial cyst 09/29/2020-doxycycline 10.  Admission 04/07/2021 with an extensive left lower extremity DVT and bilateral pulmonary emboli-Heparin followed by apixaban   Rachel Frank was admitted 04/07/2021 with a left lower extremity DVT and pulmonary emboli.  She has now maintained on apixaban anticoagulation.  I reviewed the 04/07/2021 CT  images.  She metastatic colon cancer with extensive liver metastases.  The cecal tumor remains in place.  Rachel Frank has a poor performance status, ECOG 3.  The chance of clinical benefit from further systemic therapy is very small.  I recommend hospice care.  Rachel Frank is hopeful she can continue treatment, but understands the poor prognosis and treatment recommendation. We discussed CPR and ACLS.  She agrees to no CODE BLUE status.  I agree with apixaban anticoagulation, though she is at increased risk for bleeding with the in situ cecal tumor.  We will need to consider discontinuing anticoagulation therapy or changing to Lovenox if she has significant GI bleeding.  Recommendations: [facility] referral for home hospice care Apixaban anticoagulation Palliative paracentesis No CODE BLUE Follow-up as scheduled with Cancer center      LOS: 4 days   Betsy Coder, MD   04/11/2021, 12:59 PM

## 2021-04-11 NOTE — Plan of Care (Signed)
  Problem: Health Behavior/Discharge Planning: Goal: Ability to manage health-related needs will improve Outcome: Progressing   Problem: Activity: Goal: Risk for activity intolerance will decrease Outcome: Progressing   Problem: Nutrition: Goal: Adequate nutrition will be maintained Outcome: Progressing   Problem: Pain Managment: Goal: General experience of comfort will improve Outcome: Progressing   Problem: Elimination: Goal: Will not experience complications related to bowel motility Outcome: Progressing Goal: Will not experience complications related to urinary retention Outcome: Progressing   

## 2021-04-11 NOTE — Progress Notes (Signed)
Manufacturing engineer [facility]) Hospital Liaison: RN note     Notified by Transition of Care Manger of patient/family request for [facility] services at home after discharge. Chart and patient information under review by [facility] physician. Hospice eligibility pending currently.     Writer spoke with pt's spouse, [name], to initiate education related to hospice philosophy, services and team approach to care.  [name] verbalized understanding of information given.    Please send signed and completed DNR form home with patient/family.   Please provide prescriptions for mediations at discharge to ensure comfort until patient can be admitted onto hospice services.   DME needs have been discussed.  Patient currently has the following equipment in the home: rollator and 3N1 through [employer].  Patient/family denies other DME needs at this time.  [facility] aware of the above. Please notify [facility] when patient is ready to leave the unit at discharge. (Call [phone] or [phone] after 5pm.)  [facility] information and contact numbers given to [facility].       Please do not hesitate to call with questions.   Thank you for the opportunity to participate in this patient's care.  Domenic Moras, BSN, RN       [place] (listed on AMION under [phone]  [phone] (24h on call)

## 2021-04-12 ENCOUNTER — Inpatient Hospital Stay (HOSPITAL_COMMUNITY): Payer: 59

## 2021-04-12 DIAGNOSIS — I2699 Other pulmonary embolism without acute cor pulmonale: Secondary | ICD-10-CM | POA: Diagnosis not present

## 2021-04-12 DIAGNOSIS — C19 Malignant neoplasm of rectosigmoid junction: Secondary | ICD-10-CM | POA: Diagnosis not present

## 2021-04-12 MED ORDER — APIXABAN 5 MG PO TABS: ORAL_TABLET | ORAL | 0 refills | Status: DC

## 2021-04-12 MED ORDER — LIDOCAINE HCL 1 % IJ SOLN
INTRAMUSCULAR | Status: AC
  Filled 2021-04-12: qty 20

## 2021-04-12 MED ORDER — HEPARIN SOD (PORK) LOCK FLUSH 100 UNIT/ML IV SOLN
500.0000 [IU] | INTRAVENOUS | Status: AC | PRN
  Administered 2021-04-12: 500 [IU]
  Filled 2021-04-12: qty 5

## 2021-04-12 MED ORDER — HYDROCORTISONE ACETATE 25 MG RE SUPP: 25.0000 mg | Freq: Two times a day (BID) | RECTAL | 0 refills | Status: DC | PRN

## 2021-04-12 MED ORDER — OXYCODONE HCL 5 MG PO TABS: 5.0000 mg | ORAL_TABLET | Freq: Four times a day (QID) | ORAL | 0 refills | Status: DC | PRN

## 2021-04-12 NOTE — Procedures (Signed)
PROCEDURE SUMMARY:  Successful US guided paracentesis from RLQ.  Yielded 3L of dark yellow fluid.  No immediate complications.  Pt tolerated well.   Specimen not sent for labs.  EBL < 31mL  Dyane Broberg PA-C 04/12/2021 11:03 AM

## 2021-04-12 NOTE — TOC Transition Note (Addendum)
Transition of Care [facility]) - CM/SW Discharge Note   Patient Details  Name: Rachel Frank MRN: [id number] Date of Birth: 03-Oct-1960  Transition of Care [facility]) CM/SW Contact:  Dessa Phi, RN Phone Number: 04/12/2021, 10:58 AM   Clinical Narrative: d/c home w/[facility].Signed DNR in packet  to go home with patient. Levt vm w/[facility] of d/c.family to transport home on own. No further CM needs. 12p-spoke to patient about home visit today or tomorrow from [facility] for home hospice services-she agrees to tomorrow home visit. Informed [facility]. No further CM needs.    Final next level of care: Home w Hospice Care Barriers to Discharge: No Barriers Identified   Patient Goals and CMS Choice Patient states their goals for this hospitalization and ongoing recovery are:: go home CMS Medicare.gov Compare Post Acute Care list provided to:: Patient Represenative (must comment) [name] spouse [phone]) Choice offered to / list presented to : Spouse  Discharge Placement                       Discharge Plan and Services   Discharge Planning Services: CM Consult Post Acute Care Choice: Durable Medical Equipment, Home Health          DME Arranged: 3-N-1, Walker rolling with seat DME Agency: [employer] Date DME Agency Contacted: 04/10/21 Time DME Agency Contacted: [id number] Representative spoke with at DME Agency: Thedore Mins HH Arranged: RN, PT [facility] Agency: [facility] [place] Date [place]: 04/10/21 Time [place]: 1304 Representative spoke with at [place]: [place]) Interventions     Readmission Risk Interventions Readmission Risk Prevention Plan 04/10/2021  Transportation Screening Complete  PCP or Specialist Appt within 3-5 Days Complete  HRI or [place] Complete  Social Work Consult for [place] Planning/Counseling Complete  Palliative Care Screening Complete   Medication Review Press photographer) Complete  Some recent data might be hidden

## 2021-04-12 NOTE — Progress Notes (Signed)
Patient states that she needs to change her discharge time to about 7:00 pm. She states that her family was busy today, but by then, she thinks that they will be available to pick her up.

## 2021-04-12 NOTE — Discharge Summary (Signed)
Physician Discharge Summary  Rachel Frank LEX:[id number] DOB: 08-05-1960 DOA: 04/07/2021  PCP: [name], MD  Admit date: 04/07/2021 Discharge date: 04/12/2021  Admitted From: Home Disposition: Home  Recommendations for Outpatient Follow-up:  Follow up with PCP in 1 week Please follow up on the following pending results: None  Home Health: Home hospice Equipment/Devices: 3 n 1, rolling walker with seat  Discharge Condition: Stable CODE STATUS: DNR Diet recommendation: Regular diet   Brief/Interim Summary:  Admission HPI written by Vernelle Emerald, MD   HPI: Rachel Frank is a 60 y.o. female with medical history significant for Stage 4 colon cancer followed by Oncology, HTN, HLD who presents with swelling of her legs that is worse on the left than the right.  She reports that this morning she had increasing swelling in the left leg with pain in her left distal thigh and calf.  The pain has become worse over the course of the day.  She developed some right-sided chest pain that occurred with deep inspiration.  She has not had any chest pressure or palpitations.  She reports she initially thought the pain was related to the metastasis in her liver that cause chronic pain but this pain was more sharp when she would take a deep breath she states.  She states she has not had any shortness of breath.  She denies any trauma or injury to her abdomen or chest wall.  Not had any syncope.  She denies any fever, cough.  She had a paracentesis recently without complications.  She found it difficult to walk secondary to the pain in her left calf.  Lives with her family.  Denies tobacco, alcohol, illicit drug use.   Hospital course:  Acute pulmonary embolism Left leg DVT Likely unprovoked secondary to known metastatic cancer. No right heart strain on Transthoracic Echocardiogram. Patient initially managed on Heparin IV which was transitioned to Eliquis. Complicated by hemorrhoids and  history of bright red blood in her stool, but risks were discussed and decision made for anticoagulation. Continue Eliquis indefinitely   Stage IV colorectal cancer Abdominal ascites Liver metastasis Palliative care and oncology on board. Plan is for home with hospice on discharge. Palliative paracentesis ordered prior to discharge.   Hyponatremia Baseline of around 129-130 more recently. Likely secondary to third spacing but could be a component of SIADH. Improvement with albumin and Lasix. Currently stable. Previously on Lasix as an outpatient which was complicated by hypotension.   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   Normocytic anemia Chronic. Hemoglobin currently stable while on anticoagulation.   Hemorrhoids Intermittent rectal bleeding noted from this. In setting of Eliquis, risks discussed with patient.  Discharge Diagnoses:  Principal Problem:   Acute pulmonary embolism without acute cor pulmonale (HCC) Active Problems:   Essential hypertension   Colorectal cancer, stage IV (HCC)   Acute deep vein thrombosis (DVT) of femoral vein of left lower extremity (HCC)   Prolonged QT interval   Hyponatremia   Colon cancer metastasized to liver [facility])    Discharge Instructions   Allergies as of 04/12/2021   No Known Allergies      Medication List     STOP taking these medications    amLODipine 5 MG tablet Commonly known as: NORVASC   atenolol 50 MG tablet Commonly known as: TENORMIN   azithromycin 250 MG tablet Commonly known as: Zithromax Z-Pak  hydrochlorothiazide 25 MG tablet Commonly known as: HYDRODIURIL   Lonsurf 20-8.19 MG tablet Generic drug: trifluridine-tipiracil   Promethazine-Codeine 6.25-10 MG/5ML Soln   spironolactone 25 MG tablet Commonly known as: Aldactone       TAKE these medications    apixaban 5 MG Tabs tablet Commonly  known as: ELIQUIS Take 2 tablets (10 mg total) by mouth 2 (two) times daily for 2 days, THEN 1 tablet (5 mg total) 2 (two) times daily. Start taking on: April 12, 2021   diphenoxylate-atropine 2.5-0.025 MG tablet Commonly known as: LOMOTIL Take 1 tablet by mouth 4 (four) times daily as needed for diarrhea or loose stools.   hydrocortisone 25 MG suppository Commonly known as: ANUSOL-HC Place 1 suppository (25 mg total) rectally 2 (two) times daily as needed for hemorrhoids or anal itching.   IRON PO Take 1 tablet by mouth daily.   lidocaine-prilocaine cream Commonly known as: EMLA Apply 1 application topically as needed. What changed: reasons to take this   loperamide 2 MG capsule Commonly known as: IMODIUM Take 2-4 mg by mouth as needed for diarrhea or loose stools.   magic mouthwash (nystatin, diphenhydrAMINE, alum & mag hydroxide) suspension mixture Swish and spit 5 mLs by mouth 4 (four) times daily as needed for mouth pain.   magnesium oxide 400 (241.3 Mg) MG tablet Commonly known as: MAG-OX Take 1 tablet (400 mg total) by mouth 2 (two) times daily.   ondansetron 8 MG tablet Commonly known as: ZOFRAN Take 1 tablet (8 mg total) by mouth every 8 (eight) hours as needed for nausea or vomiting. Start 72 hours after each treatment   oxyCODONE 5 MG immediate release tablet Commonly known as: Roxicodone Take 1 tablet (5 mg total) by mouth every 6 (six) hours as needed for severe pain.   prochlorperazine 10 MG tablet Commonly known as: COMPAZINE Take 1 tablet (10 mg total) by mouth every 6 (six) hours as needed for nausea or vomiting.               Durable Medical Equipment  (From admission, onward)           Start     Ordered   04/10/21 1300  For home use only DME 4 wheeled rolling walker with seat  Once       Question:  Patient needs a walker to treat with the following condition  Answer:  Unsteady gait   04/10/21 1259   04/10/21 1158  For home use only  DME 3 n 1  Once        04/10/21 1157            Follow-up Information     [facility] Follow up.   Specialty: Hospice and Palliative Medicine Why: They will make home visit tomorrow per patient request. Contact information: [address] [place]               No Known Allergies  Consultations: Palliative care medicine Medical oncology   Procedures/Studies: DG Chest 2 View  Result Date: 03/30/2021 CLINICAL DATA:  60 year old female with cough EXAM: CHEST - 2 VIEW COMPARISON:  08/20/2019 FINDINGS: Cardiomediastinal silhouette within normal limits in size and contour. No evidence of central vascular congestion. No interlobular septal thickening. Surgical changes of right IJ port catheter Low lung volumes with linear opacities at the lung bases compatible with atelectasis/scarring. No pneumothorax or pleural effusion. Coarsened interstitial markings, with no confluent airspace disease. No acute displaced fracture. Degenerative changes  of the spine. IMPRESSION: No active cardiopulmonary disease. Electronically Signed   By: Corrie Mckusick D.O.   On: 03/30/2021 16:12   CT Angio Chest PE W and/or Wo Contrast  Result Date: 04/07/2021 CLINICAL DATA:  Pt c/o swelling to left leg and calf onset yesterday. Pt reports that she sits for long periods of time due to her legs being swollen. Pt is not able to ambulate much due to stage 4 colon cancer. hAs a dvt EXAM: CT ANGIOGRAPHY CHEST CT ABDOMEN AND PELVIS WITH CONTRAST TECHNIQUE: Multidetector CT imaging of the chest was performed using the standard protocol during bolus administration of intravenous contrast. Multiplanar CT image reconstructions and MIPs were obtained to evaluate the vascular anatomy. Multidetector CT imaging of the abdomen and pelvis was performed using the standard protocol during bolus administration of intravenous contrast. CONTRAST:  81mL OMNIPAQUE IOHEXOL 350 MG/ML SOLN  COMPARISON:  CT abdomen pelvis 03/02/2021 FINDINGS: CTA CHEST FINDINGS Lines and tubes: Right chest wall Port-A-Cath with tip terminating at the inferior cavoatrial junction. Cardiovascular: Satisfactory opacification of the pulmonary arteries to the segmental level. Bilateral lower lobe segmental and subsegmental pulmonary embolus. No central pulmonary embolus. Normal heart size. No pericardial effusion. Mediastinum/Nodes: No enlarged mediastinal, hilar, or axillary lymph nodes. Thyroid gland, trachea, and esophagus demonstrate no significant findings. Lungs/Pleura: 0.4 cm subpleural right lower lobe pulmonary nodule (4:53). Micronodule subpleural right lower lobe (4:63). Right upper lobe subpleural nodule along the major fissure (4:62). Several other scattered pulmonary micronodules bilaterally. Musculoskeletal: No chest wall abnormality. No suspicious lytic or blastic osseous lesions. No acute displaced fracture. Multilevel degenerative changes of the spine. Review of the MIP images confirms the above findings. CT ABDOMEN and PELVIS FINDINGS Hepatobiliary: Redemonstration of innumerable heterogeneous hypodense hepatic mass lesions replacing the majority of the hepatic parenchyma consistent with hepatic metastases. Pancreas: No focal lesion. Normal pancreatic contour. No surrounding inflammatory changes. No main pancreatic ductal dilatation. Spleen: Normal in size without focal abnormality. Adrenals/Urinary Tract: No adrenal nodule bilaterally. Bilateral kidneys enhance symmetrically. No hydronephrosis. No hydroureter. Linear calcification within the right kidney measuring up to 6 mm. Similar-appearing hypodense lesion within left kidney measuring up to 1.2 cm with a density of 32 Hounsfield units. The urinary bladder is unremarkable. Stomach/Bowel: Stomach is within normal limits. No evidence of bowel wall thickening or dilatation. Appendix appears normal. Vascular/Lymphatic: Redemonstration of a known  nonocclusive left common femoral deep venous thrombosis extending into deep and femoral veins. The thrombus originates at the origin of the common femoral vein from the external iliac artery. No iliac deep venous thrombosis. The main portal, splenic, superior mesenteric veins are patent. No abdominal aorta or iliac aneurysm. At least moderate atherosclerotic plaque of the aorta and its branches. No abdominal, pelvic, or inguinal lymphadenopathy. Reproductive: Coarsely calcified uterine lesion grossly similar in size and appearance likely represents a uterine fibroid. Otherwise uterus and bilateral adnexa are unremarkable. Other: Possible slightly decreased small volume simple fluid ascites. No intraperitoneal free gas. No organized fluid collection. Musculoskeletal: Diffuse moderate subcutaneus soft tissue edema. No abdominal wall hernia or abnormality. No suspicious lytic or blastic osseous lesions. No acute displaced fracture. M Review of the MIP images confirms the above findings. IMPRESSION: 1. Bilateral lower lobe segmental and subsegmental pulmonary emboli. No associated right heart strain or pulmonary infarction. 2. Known nonocclusive left common femoral deep venous thrombosis extending into the deep and femoral veins. 3. Innumerable hepatic metastases. 4. Possibly slightly decreased small volume simple free fluid ascites. 5. Right chest wall Port-A-Cath with  tip terminating at the inferior cavoatrial junction. Other imaging findings of potential clinical significance: 1. Scattered pulmonary micronodules bilaterally. 2. Indeterminate stable in size 1.2 cm left renal lesion. Recommend nonemergent MRI renal protocol for further evaluation. When the patient is clinically stable and able to follow directions and hold their breath (preferably as an outpatient) further evaluation with dedicated abdominal MRI should be considered. 3. Degenerative uterine fibroid. These results were called by telephone at the time of  interpretation on 04/07/2021 at 7:21 pm to provider [facility] , who verbally acknowledged these results. Electronically Signed   By: Iven Finn M.D.   On: 04/07/2021 19:27   CT ABDOMEN PELVIS W CONTRAST  Result Date: 04/07/2021 CLINICAL DATA:  Pt c/o swelling to left leg and calf onset yesterday. Pt reports that she sits for long periods of time due to her legs being swollen. Pt is not able to ambulate much due to stage 4 colon cancer. hAs a dvt EXAM: CT ANGIOGRAPHY CHEST CT ABDOMEN AND PELVIS WITH CONTRAST TECHNIQUE: Multidetector CT imaging of the chest was performed using the standard protocol during bolus administration of intravenous contrast. Multiplanar CT image reconstructions and MIPs were obtained to evaluate the vascular anatomy. Multidetector CT imaging of the abdomen and pelvis was performed using the standard protocol during bolus administration of intravenous contrast. CONTRAST:  25mL OMNIPAQUE IOHEXOL 350 MG/ML SOLN COMPARISON:  CT abdomen pelvis 03/02/2021 FINDINGS: CTA CHEST FINDINGS Lines and tubes: Right chest wall Port-A-Cath with tip terminating at the inferior cavoatrial junction. Cardiovascular: Satisfactory opacification of the pulmonary arteries to the segmental level. Bilateral lower lobe segmental and subsegmental pulmonary embolus. No central pulmonary embolus. Normal heart size. No pericardial effusion. Mediastinum/Nodes: No enlarged mediastinal, hilar, or axillary lymph nodes. Thyroid gland, trachea, and esophagus demonstrate no significant findings. Lungs/Pleura: 0.4 cm subpleural right lower lobe pulmonary nodule (4:53). Micronodule subpleural right lower lobe (4:63). Right upper lobe subpleural nodule along the major fissure (4:62). Several other scattered pulmonary micronodules bilaterally. Musculoskeletal: No chest wall abnormality. No suspicious lytic or blastic osseous lesions. No acute displaced fracture. Multilevel degenerative changes of the spine. Review of  the MIP images confirms the above findings. CT ABDOMEN and PELVIS FINDINGS Hepatobiliary: Redemonstration of innumerable heterogeneous hypodense hepatic mass lesions replacing the majority of the hepatic parenchyma consistent with hepatic metastases. Pancreas: No focal lesion. Normal pancreatic contour. No surrounding inflammatory changes. No main pancreatic ductal dilatation. Spleen: Normal in size without focal abnormality. Adrenals/Urinary Tract: No adrenal nodule bilaterally. Bilateral kidneys enhance symmetrically. No hydronephrosis. No hydroureter. Linear calcification within the right kidney measuring up to 6 mm. Similar-appearing hypodense lesion within left kidney measuring up to 1.2 cm with a density of 32 Hounsfield units. The urinary bladder is unremarkable. Stomach/Bowel: Stomach is within normal limits. No evidence of bowel wall thickening or dilatation. Appendix appears normal. Vascular/Lymphatic: Redemonstration of a known nonocclusive left common femoral deep venous thrombosis extending into deep and femoral veins. The thrombus originates at the origin of the common femoral vein from the external iliac artery. No iliac deep venous thrombosis. The main portal, splenic, superior mesenteric veins are patent. No abdominal aorta or iliac aneurysm. At least moderate atherosclerotic plaque of the aorta and its branches. No abdominal, pelvic, or inguinal lymphadenopathy. Reproductive: Coarsely calcified uterine lesion grossly similar in size and appearance likely represents a uterine fibroid. Otherwise uterus and bilateral adnexa are unremarkable. Other: Possible slightly decreased small volume simple fluid ascites. No intraperitoneal free gas. No organized fluid collection. Musculoskeletal: Diffuse moderate subcutaneus  soft tissue edema. No abdominal wall hernia or abnormality. No suspicious lytic or blastic osseous lesions. No acute displaced fracture. M Review of the MIP images confirms the above  findings. IMPRESSION: 1. Bilateral lower lobe segmental and subsegmental pulmonary emboli. No associated right heart strain or pulmonary infarction. 2. Known nonocclusive left common femoral deep venous thrombosis extending into the deep and femoral veins. 3. Innumerable hepatic metastases. 4. Possibly slightly decreased small volume simple free fluid ascites. 5. Right chest wall Port-A-Cath with tip terminating at the inferior cavoatrial junction. Other imaging findings of potential clinical significance: 1. Scattered pulmonary micronodules bilaterally. 2. Indeterminate stable in size 1.2 cm left renal lesion. Recommend nonemergent MRI renal protocol for further evaluation. When the patient is clinically stable and able to follow directions and hold their breath (preferably as an outpatient) further evaluation with dedicated abdominal MRI should be considered. 3. Degenerative uterine fibroid. These results were called by telephone at the time of interpretation on 04/07/2021 at 7:21 pm to provider [facility] , who verbally acknowledged these results. Electronically Signed   By: Iven Finn M.D.   On: 04/07/2021 19:27   US Venous Img Lower Unilateral Left  Result Date: 04/07/2021 CLINICAL DATA:  Left calf pain and swelling EXAM: Left LOWER EXTREMITY VENOUS DOPPLER ULTRASOUND TECHNIQUE: Gray-scale sonography with graded compression, as well as color Doppler and duplex ultrasound were performed to evaluate the lower extremity deep venous systems from the level of the common femoral vein and including the common femoral, femoral, profunda femoral, popliteal and calf veins including the posterior tibial, peroneal and gastrocnemius veins when visible. The superficial great saphenous vein was also interrogated. Spectral Doppler was utilized to evaluate flow at rest and with distal augmentation maneuvers in the common femoral, femoral and popliteal veins. COMPARISON:  None. FINDINGS: Contralateral Common  Femoral Vein: Respiratory phasicity is normal and symmetric with the symptomatic side. No evidence of thrombus. Normal compressibility. Common Femoral Vein: Occlusive thrombus within the common femoral vein. Vein is noncompressible. Saphenofemoral Junction: Positive for occlusive thrombus. Vein is noncompressible Profunda Femoral Vein: Positive for near occlusive thrombus with some flow present. Partially compressible. Femoral Vein: Positive for occlusive thrombus. Vein is noncompressible Popliteal Vein: Positive for occlusive thrombus. Vein is noncompressible. Calf Veins: Positive for occlusive thrombus within the posterior tibial and peroneal veins. IMPRESSION: Positive for acute extensive occlusive thrombus extending from left common femoral vein to the calf veins. Critical Value/emergent results were called by telephone at the time of interpretation on 04/07/2021 at 5:51 pm to provider Davonna Belling , who verbally acknowledged these results. Electronically Signed   By: Donavan Foil M.D.   On: 04/07/2021 17:51   US Paracentesis  Result Date: 04/12/2021 INDICATION: Ascites, metastatic disease EXAM: ULTRASOUND GUIDED THERAPEUTIC PARACENTESIS MEDICATIONS: None. COMPLICATIONS: None immediate. PROCEDURE: Informed written consent was obtained from the patient after a discussion of the risks, benefits and alternatives to treatment. A timeout was performed prior to the initiation of the procedure. Initial ultrasound scanning demonstrates a large amount of ascites within the right lower abdominal quadrant. The right lower abdomen was prepped and draped in the usual sterile fashion. 1% lidocaine was used for local anesthesia. Following this, a 19 gauge, 7-cm, Yueh catheter was introduced. An ultrasound image was saved for documentation purposes. The paracentesis was performed. The catheter was removed and a dressing was applied. The patient tolerated the procedure well without immediate post procedural complication.  FINDINGS: A total of approximately 3L of dark yellow fluid was removed. IMPRESSION: Successful  ultrasound-guided paracentesis yielding 3L liters of peritoneal fluid. Above procedure performed by Winfred Burn, PA Electronically Signed   By: Sandi Mariscal M.D.   On: 04/12/2021 10:57   US Paracentesis  Result Date: 04/04/2021 INDICATION: Patient with history of metastatic colon cancer, recurrent ascites. Request received for diagnostic and therapeutic paracentesis up to 3 liters. EXAM: ULTRASOUND GUIDED DIAGNOSTIC AND THERAPEUTIC PARACENTESIS MEDICATIONS: 1% lidocaine to skin and subcutaneous tissue COMPLICATIONS: None immediate. PROCEDURE: Informed written consent was obtained from the patient after a discussion of the risks, benefits and alternatives to treatment. A timeout was performed prior to the initiation of the procedure. Initial ultrasound scanning demonstrates a moderate to large amount of ascites within the left lower abdominal quadrant. The left lower abdomen was prepped and draped in the usual sterile fashion. 1% lidocaine was used for local anesthesia. Following this, a 19 gauge, 10-cm, Yueh catheter was introduced. An ultrasound image was saved for documentation purposes. The paracentesis was performed. The catheter was removed and a dressing was applied. The patient tolerated the procedure well without immediate post procedural complication. FINDINGS: A total of approximately 3 liters of yellow fluid was removed. Samples were sent to the laboratory as requested by the clinical team. IMPRESSION: Successful ultrasound-guided diagnostic and therapeutic paracentesis yielding 3 liters of peritoneal fluid. Read by: Rowe Robert, PA-C Electronically Signed   By: Ruthann Cancer M.D.   On: 04/04/2021 15:26   US Paracentesis  Result Date: 03/21/2021 INDICATION: Patient with a history of colon cancer and recurrent ascites presents today for a therapeutic and diagnostic paracentesis. 3 L max EXAM: ULTRASOUND  GUIDED PARACENTESIS MEDICATIONS: 1% lidocaine 10 mL COMPLICATIONS: None immediate. PROCEDURE: Informed written consent was obtained from the patient after a discussion of the risks, benefits and alternatives to treatment. A timeout was performed prior to the initiation of the procedure. Initial ultrasound scanning demonstrates a large amount of ascites within the right lower abdominal quadrant. The right lower abdomen was prepped and draped in the usual sterile fashion. 1% lidocaine was used for local anesthesia. Following this, a 19 gauge, 7-cm, Yueh catheter was introduced. An ultrasound image was saved for documentation purposes. The paracentesis was performed. The catheter was removed and a dressing was applied. The patient tolerated the procedure well without immediate post procedural complication. FINDINGS: A total of approximately 3 L of amber-colored fluid was removed. Samples were sent to the laboratory as requested by the clinical team. IMPRESSION: Successful ultrasound-guided paracentesis yielding 3 liters of peritoneal fluid. Read by: Soyla Dryer, NP Electronically Signed   By: Ruthann Cancer M.D.   On: 03/21/2021 16:19   DG Chest Portable 1 View  Result Date: 04/07/2021 CLINICAL DATA:  Chest pain EXAM: PORTABLE CHEST 1 VIEW COMPARISON:  03/30/2021 FINDINGS: Right-sided chest port is unchanged in positioning. Normal heart size. Low lung volumes. No focal airspace consolidation, pleural effusion, or pneumothorax. IMPRESSION: Low lung volumes.  No acute cardiopulmonary findings. Electronically Signed   By: Davina Poke D.O.   On: 04/07/2021 18:11   ECHOCARDIOGRAM COMPLETE  Result Date: 04/08/2021    ECHOCARDIOGRAM REPORT   Patient Name:   Rachel Frank Date of Exam: 04/08/2021 Medical Rec #:  [id number]    Height:       62.0 in Accession #:    [id number]   Weight:       164.7 lb Date of Birth:  [birth year]    BSA:          1.760 m Patient Age:    20 years  BP:           123/76 mmHg Patient  Gender: F            HR:           99 bpm. Exam Location:  Inpatient Procedure: 2D Echo, Color Doppler and Cardiac Doppler Indications:    I26.02 Pulmonary embolus  History:        Patient has no prior history of Echocardiogram examinations.                 Risk Factors:Hypertension and Dyslipidemia.  Sonographer:    Raquel Sarna Senior RDCS Referring Phys: [id number] [place]  1. Left ventricular ejection fraction, by estimation, is 60 to 65%. The left ventricle has normal function. The left ventricle has no regional wall motion abnormalities. Left ventricular diastolic parameters are consistent with Grade I diastolic dysfunction (impaired relaxation).  2. Right ventricular systolic function is normal. The right ventricular size is normal.  3. The mitral valve is abnormal. Mild mitral valve regurgitation. No evidence of mitral stenosis.  4. The aortic valve is tricuspid. There is mild calcification of the aortic valve. Aortic valve regurgitation is trivial. Mild aortic valve sclerosis is present, with no evidence of aortic valve stenosis.  5. The inferior vena cava is normal in size with greater than 50% respiratory variability, suggesting right atrial pressure of 3 mmHg. FINDINGS  Left Ventricle: Left ventricular ejection fraction, by estimation, is 60 to 65%. The left ventricle has normal function. The left ventricle has no regional wall motion abnormalities. The left ventricular internal cavity size was normal in size. There is  no left ventricular hypertrophy. Left ventricular diastolic parameters are consistent with Grade I diastolic dysfunction (impaired relaxation). Right Ventricle: The right ventricular size is normal. No increase in right ventricular wall thickness. Right ventricular systolic function is normal. Left Atrium: Left atrial size was normal in size. Right Atrium: Right atrial size was normal in size. Pericardium: There is no evidence of pericardial effusion. Mitral Valve: The mitral  valve is abnormal. There is mild thickening of the mitral valve leaflet(s). There is mild calcification of the mitral valve leaflet(s). Mild mitral valve regurgitation. No evidence of mitral valve stenosis. Tricuspid Valve: The tricuspid valve is normal in structure. Tricuspid valve regurgitation is not demonstrated. No evidence of tricuspid stenosis. Aortic Valve: The aortic valve is tricuspid. There is mild calcification of the aortic valve. Aortic valve regurgitation is trivial. Mild aortic valve sclerosis is present, with no evidence of aortic valve stenosis. Pulmonic Valve: The pulmonic valve was normal in structure. Pulmonic valve regurgitation is not visualized. No evidence of pulmonic stenosis. Aorta: The aortic root is normal in size and structure. Venous: The inferior vena cava is normal in size with greater than 50% respiratory variability, suggesting right atrial pressure of 3 mmHg. IAS/Shunts: No atrial level shunt detected by color flow Doppler.  LEFT VENTRICLE PLAX 2D LVIDd:         3.60 cm  Diastology LVIDs:         2.30 cm  LV e' medial:    7.18 cm/s LV PW:         0.70 cm  LV E/e' medial:  8.0 LV IVS:        0.70 cm  LV e' lateral:   9.36 cm/s LVOT diam:     1.80 cm  LV E/e' lateral: 6.1 LV SV:         45 LV SV Index:   25  LVOT Area:     2.54 cm  RIGHT VENTRICLE RV S prime:     5.33 cm/s TAPSE (M-mode): 1.5 cm LEFT ATRIUM             Index       RIGHT ATRIUM          Index LA diam:        3.10 cm 1.76 cm/m  RA Area:     5.36 cm LA Vol (A2C):   32.6 ml 18.52 ml/m RA Volume:   6.54 ml  3.72 ml/m LA Vol (A4C):   24.8 ml 14.09 ml/m LA Biplane Vol: 30.5 ml 17.33 ml/m  AORTIC VALVE LVOT Vmax:   112.00 cm/s LVOT Vmean:  75.700 cm/s LVOT VTI:    0.176 m  AORTA Ao Root diam: 2.60 cm Ao Asc diam:  2.70 cm MITRAL VALVE MV Area (PHT): 2.93 cm      SHUNTS MV Decel Time: 259 msec      Systemic VTI:  0.18 m MR Peak grad:    128.1 mmHg  Systemic Diam: 1.80 cm MR Mean grad:    93.0 mmHg MR Vmax:          566.00 cm/s MR Vmean:        461.0 cm/s MR PISA:         0.57 cm MR PISA Eff ROA: 4 mm MR PISA Radius:  0.30 cm MV E velocity: 57.40 cm/s MV A velocity: 87.40 cm/s MV E/A ratio:  0.66 Jenkins Rouge MD Electronically signed by Jenkins Rouge MD Signature Date/Time: 04/08/2021/10:02:34 AM    Final       Subjective: No issues overnight. Some improvement in abdominal discomfort from paracentesis.  Discharge Exam: Vitals:   04/12/21 1005 04/12/21 1038  BP: 124/82 122/85  Pulse:    Resp:    Temp:    SpO2:     Vitals:   04/12/21 0954 04/12/21 0959 04/12/21 1005 04/12/21 1038  BP: (!) 136/94 131/88 124/82 122/85  Pulse:      Resp:      Temp:      TempSrc:      SpO2:      Weight:      Height:        General exam: Appears calm and comfortable  Respiratory system: Clear to auscultation. Respiratory effort normal. Cardiovascular system: S1 & S2 heard, RRR. Gastrointestinal system: Abdomen is distended, soft and slightly tender. Normal bowel sounds heard. Central nervous system: Alert and oriented. No focal neurological deficits. Musculoskeletal: BLE pitting edema. No calf tenderness Skin: No cyanosis. No rashes Psychiatry: Judgement and insight appear normal. Mood & affect appropriate.     The results of significant diagnostics from this hospitalization (including imaging, microbiology, ancillary and laboratory) are listed below for reference.     Microbiology: Recent Results (from the past 240 hour(s))  Resp Panel by RT-PCR (Flu A&B, Covid) Nasopharyngeal Swab     Status: None   Collection Time: 04/07/21  6:43 PM   Specimen: Nasopharyngeal Swab; Nasopharyngeal(NP) swabs in vial transport medium  Result Value Ref Range Status   SARS Coronavirus 2 by RT PCR NEGATIVE NEGATIVE Final    Comment: (NOTE) SARS-CoV-2 target nucleic acids are NOT DETECTED.  The SARS-CoV-2 RNA is generally detectable in upper respiratory specimens during the acute phase of infection. The  lowest concentration of SARS-CoV-2 viral copies this assay can detect is 138 copies/mL. A negative result does not preclude SARS-Cov-2 infection and should not be used as  the sole basis for treatment or other patient management decisions. A negative result may occur with  improper specimen collection/handling, submission of specimen other than nasopharyngeal swab, presence of viral mutation(s) within the areas targeted by this assay, and inadequate number of viral copies(<138 copies/mL). A negative result must be combined with clinical observations, patient history, and epidemiological information. The expected result is Negative.  Fact Sheet for Patients:  EntrepreneurPulse.com.au  Fact Sheet for Healthcare Providers:  IncredibleEmployment.be  This test is no t yet approved or cleared by the [place] FDA and  has been authorized for detection and/or diagnosis of SARS-CoV-2 by FDA under an Emergency Use Authorization (EUA). This EUA will remain  in effect (meaning this test can be used) for the duration of the COVID-19 declaration under Section 564(b)(1) of the Act, 21 U.S.C.section 360bbb-3(b)(1), unless the authorization is terminated  or revoked sooner.       Influenza A by PCR NEGATIVE NEGATIVE Final   Influenza B by PCR NEGATIVE NEGATIVE Final    Comment: (NOTE) The Xpert Xpress SARS-CoV-2/FLU/RSV plus assay is intended as an aid in the diagnosis of influenza from Nasopharyngeal swab specimens and should not be used as a sole basis for treatment. Nasal washings and aspirates are unacceptable for Xpert Xpress SARS-CoV-2/FLU/RSV testing.  Fact Sheet for Patients: EntrepreneurPulse.com.au  Fact Sheet for Healthcare Providers: IncredibleEmployment.be  This test is not yet approved or cleared by the [place] FDA and has been authorized for detection and/or diagnosis of SARS-CoV-2 by FDA under  an Emergency Use Authorization (EUA). This EUA will remain in effect (meaning this test can be used) for the duration of the COVID-19 declaration under Section 564(b)(1) of the Act, 21 U.S.C. section 360bbb-3(b)(1), unless the authorization is terminated or revoked.  Performed at [facility], [address], [place], [place] [address]      Labs: BNP (last 3 results) No results for input(s): BNP in the last 8760 hours. Basic Metabolic Panel: Recent Labs  Lab 04/07/21 1640 04/08/21 0122 04/09/21 0410 04/10/21 0352 04/11/21 0307  NA 124* 128* 127* 125* 129*  K 4.4 4.6 4.7 4.3 4.1  CL 94* 98 100 96* 99  CO2 22 20* 20* 23 21*  GLUCOSE 100* 92 77 103* 96  BUN 30* 30* 24* 24* 20  CREATININE 0.90 1.04* 0.95 0.78 0.93  CALCIUM 7.8* 7.6* 7.8* 7.6* 8.2*   Liver Function Tests: Recent Labs  Lab 04/07/21 1640 04/10/21 0352  AST 40 55*  ALT 11 14  ALKPHOS 619* 703*  BILITOT 2.6* 1.8*  PROT 5.7* 5.7*  ALBUMIN 2.0* 1.4*   No results for input(s): LIPASE, AMYLASE in the last 168 hours. No results for input(s): AMMONIA in the last 168 hours. CBC: Recent Labs  Lab 04/07/21 1640 04/08/21 0122 04/10/21 0352 04/11/21 0307  WBC 4.0 4.8 4.8 5.3  NEUTROABS 1.7  --   --   --   HGB 10.5* 9.8* 9.9* 10.1*  HCT 32.3* 29.9* 30.1* 31.1*  MCV 90.0 91.2 91.5 92.8  PLT 243 243 255 279   Cardiac Enzymes: No results for input(s): CKTOTAL, CKMB, CKMBINDEX, TROPONINI in the last 168 hours. BNP: Invalid input(s): POCBNP CBG: No results for input(s): GLUCAP in the last 168 hours. D-Dimer No results for input(s): DDIMER in the last 72 hours. Hgb A1c No results for input(s): HGBA1C in the last 72 hours. Lipid Profile No results for input(s): CHOL, HDL, LDLCALC, TRIG, CHOLHDL, LDLDIRECT in the last 72 hours. Thyroid function studies No results  for input(s): TSH, T4TOTAL, T3FREE, THYROIDAB in the last 72 hours.  Invalid input(s): FREET3 Anemia work up No results  for input(s): VITAMINB12, FOLATE, FERRITIN, TIBC, IRON, RETICCTPCT in the last 72 hours. Urinalysis    Component Value Date/Time   COLORURINE YELLOW 09/05/2020 0805   APPEARANCEUR CLEAR 09/05/2020 0805   LABSPEC 1.016 09/05/2020 0805   PHURINE 7.0 09/05/2020 0805   GLUCOSEU NEGATIVE 09/05/2020 0805   HGBUR NEGATIVE 09/05/2020 0805   HGBUR negative 03/29/2009 0829   BILIRUBINUR NEGATIVE 09/05/2020 0805   BILIRUBINUR n 10/21/2017 1409   KETONESUR NEGATIVE 09/05/2020 0805   PROTEINUR NEGATIVE 03/21/2021 1203   UROBILINOGEN 1.0 10/21/2017 1409   UROBILINOGEN 0.2 03/29/2009 0829   NITRITE NEGATIVE 09/05/2020 0805   LEUKOCYTESUR NEGATIVE 09/05/2020 0805   Sepsis Labs Invalid input(s): PROCALCITONIN,  WBC,  LACTICIDVEN Microbiology Recent Results (from the past 240 hour(s))  Resp Panel by RT-PCR (Flu A&B, Covid) Nasopharyngeal Swab     Status: None   Collection Time: 04/07/21  6:43 PM   Specimen: Nasopharyngeal Swab; Nasopharyngeal(NP) swabs in vial transport medium  Result Value Ref Range Status   SARS Coronavirus 2 by RT PCR NEGATIVE NEGATIVE Final    Comment: (NOTE) SARS-CoV-2 target nucleic acids are NOT DETECTED.  The SARS-CoV-2 RNA is generally detectable in upper respiratory specimens during the acute phase of infection. The lowest concentration of SARS-CoV-2 viral copies this assay can detect is 138 copies/mL. A negative result does not preclude SARS-Cov-2 infection and should not be used as the sole basis for treatment or other patient management decisions. A negative result may occur with  improper specimen collection/handling, submission of specimen other than nasopharyngeal swab, presence of viral mutation(s) within the areas targeted by this assay, and inadequate number of viral copies(<138 copies/mL). A negative result must be combined with clinical observations, patient history, and epidemiological information. The expected result is Negative.  Fact Sheet for  Patients:  EntrepreneurPulse.com.au  Fact Sheet for Healthcare Providers:  IncredibleEmployment.be  This test is no t yet approved or cleared by the [place] FDA and  has been authorized for detection and/or diagnosis of SARS-CoV-2 by FDA under an Emergency Use Authorization (EUA). This EUA will remain  in effect (meaning this test can be used) for the duration of the COVID-19 declaration under Section 564(b)(1) of the Act, 21 U.S.C.section 360bbb-3(b)(1), unless the authorization is terminated  or revoked sooner.       Influenza A by PCR NEGATIVE NEGATIVE Final   Influenza B by PCR NEGATIVE NEGATIVE Final    Comment: (NOTE) The Xpert Xpress SARS-CoV-2/FLU/RSV plus assay is intended as an aid in the diagnosis of influenza from Nasopharyngeal swab specimens and should not be used as a sole basis for treatment. Nasal washings and aspirates are unacceptable for Xpert Xpress SARS-CoV-2/FLU/RSV testing.  Fact Sheet for Patients: EntrepreneurPulse.com.au  Fact Sheet for Healthcare Providers: IncredibleEmployment.be  This test is not yet approved or cleared by the [place] FDA and has been authorized for detection and/or diagnosis of SARS-CoV-2 by FDA under an Emergency Use Authorization (EUA). This EUA will remain in effect (meaning this test can be used) for the duration of the COVID-19 declaration under Section 564(b)(1) of the Act, 21 U.S.C. section 360bbb-3(b)(1), unless the authorization is terminated or revoked.  Performed at [facility], [address] [place], [place] [address]      Time coordinating discharge: 35 minutes  SIGNED:   Cordelia Poche, MD [facility] 04/12/2021, 1:56 PM

## 2021-04-12 NOTE — Progress Notes (Signed)
Patient stated that she will be discharging at about 5:30 pm. States that this is when her daughter and husband will be available to pick her up.

## 2021-04-12 NOTE — Progress Notes (Addendum)
[facility])  Rachel Frank is set to discharge today with [facility] hospice services at home. Hospice eligibilty has been confirmed.   Spoke to [facility] to confirm discharge by private vehicle. Patient has DME in home already and no further DME needed. Left message with [name] about nurse visit schedule time for 9/30.   Please send signed and completed DNR form home with patient/family. Please provide prescriptions for mediations at discharge to ensure comfort until patient can be admitted onto hospice services.    [facility] aware of the above. Please notify [facility] when patient is ready to leave the unit at discharge. (Call [phone] or [phone] after 5pm.)  [facility] information and contact numbers given to [facility].        Please do not hesitate to call with questions.    Thank you for the opportunity to participate in this patient's care. Clementeen Hoof, BSN, [employer] [phone]

## 2021-04-12 NOTE — Progress Notes (Addendum)
IP PROGRESS NOTE  Subjective:   Rachel Frank is feeling better this morning.  Pain is well controlled with current pain medications.  She just returned from a centesis at the time my visit.  She reports that her abdomen feels less distended at this time.  She still reports some mild swelling in her lower extremities.  Objective: Vital signs in last 24 hours: Blood pressure 122/85, pulse (!) 106, temperature 97.8 F (36.6 C), temperature source Oral, resp. rate 16, height _0  (1.575 m), weight 74.7 kg, SpO2 98 %.  Intake/Output from previous day: No intake/output data recorded.  Physical Exam:  HEENT: No thrush Lungs: Clear bilaterally Cardiac: Regular rate and rhythm Abdomen: Distended with ascites which has improved following paracentesis, no hepatomegaly Extremities: Trace edema at the lower leg bilaterally Rectal: Large external hemorrhoids without bleeding  Portacath/PICC-without erythema  Lab Results: Recent Labs    04/10/21 0352 04/11/21 0307  WBC 4.8 5.3  HGB 9.9* 10.1*  HCT 30.1* 31.1*  PLT 255 279    BMET Recent Labs    04/10/21 0352 04/11/21 0307  NA 125* 129*  K 4.3 4.1  CL 96* 99  CO2 23 21*  GLUCOSE 103* 96  BUN 24* 20  CREATININE 0.78 0.93  CALCIUM 7.6* 8.2*    Lab Results  Component Value Date   CEA1 33,343.02 (H) 12/12/2020   CEA 45,662.00 (H) 03/21/2021    Studies/Results: US Paracentesis  Result Date: 04/12/2021 Pasty Spillers, PA     04/12/2021 11:03 AM PROCEDURE SUMMARY: Successful US guided paracentesis from RLQ. Yielded 3L of dark yellow fluid. No immediate complications. Pt tolerated well. Specimen not sent for labs. EBL < 91m Hayley Boisseau PA-C 04/12/2021 11:03 AM    Medications: I have reviewed the patient's current medications.  Assessment/Plan: Stage IV adenocarcinoma of the cecum. She presented with a large cecal mass, extensive hepatic metastatic disease; mild thoracic and abdominal adenopathy. She also has several small  nonspecific pulmonary nodules. She was diagnosed in March 2021. Colonoscopy 09/16/2019-fungating mass at the cecum, sessile polyps in the this sigmoid, transverse colon, and hepatic flexure, adenocarcinoma involving cecum biopsy, tubular adenoma and sessile serrated polyps CTs chest, abdomen, and pelvis 10/08/2019-extensive liver metastases, thoracic/abdominal adenopathy, cecal mass, dilated and thin thickened appendix, nonspecific pulmonary nodules Ultrasound-guided liver biopsy 10/13/2019-adenocarcinoma consistent with a metastatic colorectal primary, MSS, tumor mutation burden 3, KRAS Q61, Cycle 1 FOLFOX 10/14/2019 Cycle 2 FOLFOX 10/27/2019 Cycle 3 FOLFOX 11/11/2019, Emend and Decadron added, 5-FU dose escalated Cycle 4 FOLFOX 11/25/2019, Udenyca added Cycle 5 FOLFOX 12/08/2019 Cycle 6 FOLFOX 12/23/2019 CT 01/04/2020-decreased size of multiple hepatic metastases, slight decrease in bulk of cecal mass, stable ileocecal adenopathy Cycle 7 FOLFOX 01/06/2020 Cycle 8 FOLFOX 01/31/2020 Cycle 9 FOLFOX 02/17/2020 (oxaliplatin dose reduced due to borderline ANC, persistent cold sensitivity) Cycle 10 FOLFOX 03/02/2020 Cycle 11 FOLFOX 03/16/2020 Cycle 12 FOLFOX 03/30/2020 (oxaliplatin held secondary to neuropathy) CT abdomen/pelvis 04/19/2020-no significant change in the abnormal soft tissue lesion in the region of the cecal tip involving the ileocecal valve and appendiceal orifice; slight interval progression of numerous hepatic metastases; similar appearance of small lymph nodes in the ileocolic mesentery with adjacent irregular focus of soft tissue also stable Cycle 1 FOLFIRI/Avastin 04/25/2020 Cycle 2 FOLFIRI/Avastin 05/11/2020 Cycle 3 FOLFIRI 05/25/2020 (Avastin held due to proteinuria) Cycle 4 FOLFIRI/Avastin 06/07/2020 Cycle 5 FOLFIRI/Avastin 06/22/2020 CTs 07/05/2020-mild improvement in multifocal liver metastases.  Mass at the base of the cecum is stable.  Persistent thickening of the appendix. Cycle 6  FOLFIRI/Avastin  07/05/2020 Cycle 7 FOLFIRI/Avastin 07/20/2020 Cycle 8 FOLFIRI/Avastin 08/03/2020 Cycle 9 FOLFIRI 09/05/2020, Avastin held secondary to proteinuria CT chest 09/05/2020-negative for pulmonary embolism, no acute disease, unchanged small pulmonary nodules Cycle 10 FOLFIRI 09/20/2020, Avastin held secondary to proteinuria Cycle 11 FOLFIRI 10/04/2020, Avastin held secondary to proteinuria CTs 10/17/2020-increase in size of liver metastases, stable cecal mass, slight decrease in size of right lower quadrant mesenteric lymph node Cycle 12 FOLFIRI 10/18/2020, Avastin held secondary to proteinuria Cycle 1 FOLFOX/Avastin 11/13/2020 Cycle 2 FOLFOX/Avastin 11/27/2020 Cycle 3 FOLFOX/Avastin 12/12/2020 Cycle 4 FOLFOX/Avastin 12/26/2020 CT 01/04/2021-bulky confluent liver metastases, some are larger and others are stable, increased ascites with suggestion of peritoneal thickening and nodularity, unchanged cecal mass 01/09/2021-every 2-week Avastin continued Cycle 1 [place] 01/15/2021 Therapeutic paracentesis 01/18/2021, 01/24/2021-cytology negative Cycle 2 L[place]urf 02/12/2021 CT abdomen/pelvis 03/02/2021-diffuse hepatic metastases, not significantly changed, large volume ascites, peritoneal thickening and enhancement suspicious for carcinomatosis Cycle 3 Lonsurf 03/12/2021 2) Iron deficiency anemia secondary to #1.  3.  Pain secondary to #1-improved 4.  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  Diarrhea 01/20/2020.  C. difficile negative 7.  COVID-19 infection 08/13/2020, antibody infusion therapy on 08/19/2020 8.  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  Inflamed left labial cyst 09/29/2020-doxycycline 10.  Admission 04/07/2021 with an extensive left lower extremity DVT and  bilateral pulmonary emboli-Heparin followed by apixaban   Rachel Frank appears stable.  She has now maintained on apixaban anticoagulation. She metastatic colon cancer with extensive liver metastases.  The cecal tumor remains in place.  Rachel Frank has a poor performance status, ECOG 3.  The chance of clinical benefit from further systemic therapy is very small.  She is agreeable to hospice care.  Rachel Frank is hopeful she can continue treatment, but understands the poor prognosis and treatment recommendation. We discussed CPR and ACLS.  She agrees to no CODE BLUE status.  I agree with apixaban anticoagulation, though she is at increased risk for bleeding with the in situ cecal tumor.  We will need to consider discontinuing anticoagulation therapy or changing to Lovenox if she has significant GI bleeding.  She developed worsening ascites today and underwent an ultrasound-guided paracentesis earlier today.  Recommendations: [facility] referral for home hospice care, I will serve as the primary provider with hospice Apixaban anticoagulation Palliative paracentesis No CODE BLUE Follow-up as scheduled with Cancer center, please call oncology as needed    LOS: 5 days   Mikey Bussing, NP   04/12/2021, 11:49 AM Rachel Frank was interviewed and examined.  She appears stable.  She will undergo a therapeutic paracentesis today.  She will continue apixaban anticoagulation.  I will see her as an outpatient.  I was present for greater than 50% of today's visit.  I performed medical decision making.  Julieanne Manson, MD

## 2021-04-13 ENCOUNTER — Other Ambulatory Visit (HOSPITAL_COMMUNITY): Payer: Self-pay

## 2021-04-13 ENCOUNTER — Encounter: Payer: Self-pay | Admitting: Physician Assistant

## 2021-04-13 ENCOUNTER — Other Ambulatory Visit: Payer: Self-pay | Admitting: Nurse Practitioner

## 2021-04-13 ENCOUNTER — Encounter: Payer: Self-pay | Admitting: Oncology

## 2021-04-13 ENCOUNTER — Telehealth: Payer: Self-pay | Admitting: *Deleted

## 2021-04-13 DIAGNOSIS — C19 Malignant neoplasm of rectosigmoid junction: Secondary | ICD-10-CM

## 2021-04-13 DIAGNOSIS — G893 Neoplasm related pain (acute) (chronic): Secondary | ICD-10-CM

## 2021-04-13 MED ORDER — OXYCODONE HCL 5 MG PO TABS
5.0000 mg | ORAL_TABLET | Freq: Four times a day (QID) | ORAL | 0 refills | Status: AC | PRN
  Filled 2021-04-13: qty 50, 12d supply, fill #0
  Filled 2021-04-16: qty 50, 13d supply, fill #0

## 2021-04-13 MED ORDER — HYDROCORTISONE ACETATE 25 MG RE SUPP
25.0000 mg | Freq: Two times a day (BID) | RECTAL | 0 refills | Status: AC | PRN
  Filled 2021-04-13: qty 12, 6d supply, fill #0

## 2021-04-13 MED ORDER — APIXABAN 5 MG PO TABS
ORAL_TABLET | ORAL | 0 refills | Status: AC
  Filled 2021-04-13: qty 68, 34d supply, fill #0

## 2021-04-13 NOTE — Telephone Encounter (Signed)
Called patient to f/u on when she would like to return to office for visit. D/C home on 9/29 w/Hospice orders. She would like to come in in the next 1-2 weeks. Also reports that [employer] are down and she can't get the meds that were sent in yesterday: Eliquis, Anusol HC and oxycodone (husband was at the pharmacy). Would like all sent to [facility]. Attempted to reach [place] and phone is down as well. Sent Eliquis and Anusol to [facility] and left note for MD to send oxycodone to [facility]. Scheduling message sent.

## 2021-04-16 ENCOUNTER — Telehealth: Payer: Self-pay

## 2021-04-16 ENCOUNTER — Other Ambulatory Visit (HOSPITAL_COMMUNITY): Payer: Self-pay

## 2021-04-16 NOTE — Telephone Encounter (Signed)
TC from [place] with [facility] [phone] trying to confirm that Dr. Benay Spice will be attending for the Pt. Confirmed with [place] Dr Benay Spice will be attending.

## 2021-04-17 ENCOUNTER — Inpatient Hospital Stay: Payer: 59 | Attending: Physician Assistant | Admitting: Nurse Practitioner

## 2021-04-17 ENCOUNTER — Inpatient Hospital Stay: Payer: 59

## 2021-04-17 ENCOUNTER — Other Ambulatory Visit: Payer: Self-pay

## 2021-04-17 ENCOUNTER — Encounter: Payer: Self-pay | Admitting: Nurse Practitioner

## 2021-04-17 VITALS — BP 125/93 | HR 104 | Temp 98.1°F | Resp 18 | Ht 62.0 in | Wt 167.8 lb

## 2021-04-17 DIAGNOSIS — Z7901 Long term (current) use of anticoagulants: Secondary | ICD-10-CM | POA: Diagnosis not present

## 2021-04-17 DIAGNOSIS — Z86711 Personal history of pulmonary embolism: Secondary | ICD-10-CM | POA: Insufficient documentation

## 2021-04-17 DIAGNOSIS — D508 Other iron deficiency anemias: Secondary | ICD-10-CM | POA: Diagnosis not present

## 2021-04-17 DIAGNOSIS — C19 Malignant neoplasm of rectosigmoid junction: Secondary | ICD-10-CM

## 2021-04-17 DIAGNOSIS — Z86718 Personal history of other venous thrombosis and embolism: Secondary | ICD-10-CM | POA: Insufficient documentation

## 2021-04-17 DIAGNOSIS — C180 Malignant neoplasm of cecum: Secondary | ICD-10-CM | POA: Diagnosis present

## 2021-04-17 DIAGNOSIS — Z79899 Other long term (current) drug therapy: Secondary | ICD-10-CM | POA: Diagnosis not present

## 2021-04-17 DIAGNOSIS — C787 Secondary malignant neoplasm of liver and intrahepatic bile duct: Secondary | ICD-10-CM | POA: Diagnosis present

## 2021-04-17 DIAGNOSIS — R918 Other nonspecific abnormal finding of lung field: Secondary | ICD-10-CM | POA: Insufficient documentation

## 2021-04-17 LAB — CBC WITH DIFFERENTIAL (CANCER CENTER ONLY)
Abs Immature Granulocytes: 0.08 10*3/uL — ABNORMAL HIGH (ref 0.00–0.07)
Basophils Absolute: 0.1 10*3/uL (ref 0.0–0.1)
Basophils Relative: 0 %
Eosinophils Absolute: 0 10*3/uL (ref 0.0–0.5)
Eosinophils Relative: 0 %
HCT: 31.5 % — ABNORMAL LOW (ref 36.0–46.0)
Hemoglobin: 10.4 g/dL — ABNORMAL LOW (ref 12.0–15.0)
Immature Granulocytes: 1 %
Lymphocytes Relative: 10 %
Lymphs Abs: 1.3 10*3/uL (ref 0.7–4.0)
MCH: 30.2 pg (ref 26.0–34.0)
MCHC: 33 g/dL (ref 30.0–36.0)
MCV: 91.6 fL (ref 80.0–100.0)
Monocytes Absolute: 1.7 10*3/uL — ABNORMAL HIGH (ref 0.1–1.0)
Monocytes Relative: 12 %
Neutro Abs: 10.5 10*3/uL — ABNORMAL HIGH (ref 1.7–7.7)
Neutrophils Relative %: 77 %
Platelet Count: 361 10*3/uL (ref 150–400)
RBC: 3.44 MIL/uL — ABNORMAL LOW (ref 3.87–5.11)
RDW: 20.8 % — ABNORMAL HIGH (ref 11.5–15.5)
WBC Count: 13.7 10*3/uL — ABNORMAL HIGH (ref 4.0–10.5)
nRBC: 0 % (ref 0.0–0.2)

## 2021-04-17 LAB — CMP (CANCER CENTER ONLY)
ALT: 16 U/L (ref 0–44)
AST: 60 U/L — ABNORMAL HIGH (ref 15–41)
Albumin: 2.3 g/dL — ABNORMAL LOW (ref 3.5–5.0)
Alkaline Phosphatase: 772 U/L — ABNORMAL HIGH (ref 38–126)
Anion gap: 11 (ref 5–15)
BUN: 37 mg/dL — ABNORMAL HIGH (ref 8–23)
CO2: 19 mmol/L — ABNORMAL LOW (ref 22–32)
Calcium: 8.7 mg/dL — ABNORMAL LOW (ref 8.9–10.3)
Chloride: 94 mmol/L — ABNORMAL LOW (ref 98–111)
Creatinine: 1.11 mg/dL — ABNORMAL HIGH (ref 0.44–1.00)
GFR, Estimated: 56 mL/min — ABNORMAL LOW (ref >60)
Glucose, Bld: 108 mg/dL — ABNORMAL HIGH (ref 70–99)
Potassium: 4.9 mmol/L (ref 3.5–5.1)
Sodium: 124 mmol/L — ABNORMAL LOW (ref 135–145)
Total Bilirubin: 2.3 mg/dL — ABNORMAL HIGH (ref 0.3–1.2)
Total Protein: 6.7 g/dL (ref 6.5–8.1)

## 2021-04-17 LAB — CEA (ACCESS): CEA (CHCC): 46786 ng/mL — ABNORMAL HIGH (ref 0.00–5.00)

## 2021-04-17 NOTE — Progress Notes (Signed)
[facility] OFFICE PROGRESS NOTE   Diagnosis: Colon cancer  INTERVAL HISTORY:   Ms. Politte returns as scheduled.  She was hospitalized 04/07/2021 through 04/12/2021 with lower extremity edema and left leg pain.  She was found to have acute extensive occlusive thrombus extending from the left common femoral vein to the calf veins.  CT chest showed bilateral lower lobe segmental and subsegmental pulmonary emboli, no associated right heart strain or pulmonary infarction.  She was started on Eliquis.  She was discharged home with hospice.  No further chest pain.  She continues to note bilateral leg edema.  No bleeding except a small amount following a bowel movement today.  She thinks this may be due to a hemorrhoid.  Abdomen remains distended.  She would like to have a paracentesis.  Objective:  Vital signs in last 24 hours:  Blood pressure (!) 125/93, pulse (!) 104, temperature 98.1 F (36.7 C), temperature source Oral, resp. rate 18, height _0  (1.575 m), weight 167 lb 12.8 oz (76.1 kg), SpO2 100 %.    HEENT: No thrush or ulcers. Resp: Lungs clear bilaterally. Cardio: Regular rate and rhythm. GI: Abdomen distended, exam consistent with ascites. Vascular: Edema throughout both legs, left appears slightly more edematous than right. Neuro: Alert and oriented. Port-A-Cath without erythema.  Lab Results:  Lab Results  Component Value Date   WBC 5.3 04/11/2021   HGB 10.1 (L) 04/11/2021   HCT 31.1 (L) 04/11/2021   MCV 92.8 04/11/2021   PLT 279 04/11/2021   NEUTROABS 1.7 04/07/2021    Imaging:  No results found.  Medications: I have reviewed the patient's current medications.  Assessment/Plan: Stage IV adenocarcinoma of the cecum. She presented with a large cecal mass, extensive hepatic metastatic disease; mild thoracic and abdominal adenopathy. She also has several small nonspecific pulmonary nodules. She was diagnosed in March 2021. Colonoscopy  09/16/2019-fungating mass at the cecum, sessile polyps in the this sigmoid, transverse colon, and hepatic flexure, adenocarcinoma involving cecum biopsy, tubular adenoma and sessile serrated polyps CTs chest, abdomen, and pelvis 10/08/2019-extensive liver metastases, thoracic/abdominal adenopathy, cecal mass, dilated and thin thickened appendix, nonspecific pulmonary nodules Ultrasound-guided liver biopsy 10/13/2019-adenocarcinoma consistent with a metastatic colorectal primary, MSS, tumor mutation burden 3, KRAS Q61, Cycle 1 FOLFOX 10/14/2019 Cycle 2 FOLFOX 10/27/2019 Cycle 3 FOLFOX 11/11/2019, Emend and Decadron added, 5-FU dose escalated Cycle 4 FOLFOX 11/25/2019, Udenyca added Cycle 5 FOLFOX 12/08/2019 Cycle 6 FOLFOX 12/23/2019 CT 01/04/2020-decreased size of multiple hepatic metastases, slight decrease in bulk of cecal mass, stable ileocecal adenopathy Cycle 7 FOLFOX 01/06/2020 Cycle 8 FOLFOX 01/31/2020 Cycle 9 FOLFOX 02/17/2020 (oxaliplatin dose reduced due to borderline ANC, persistent cold sensitivity) Cycle 10 FOLFOX 03/02/2020 Cycle 11 FOLFOX 03/16/2020 Cycle 12 FOLFOX 03/30/2020 (oxaliplatin held secondary to neuropathy) CT abdomen/pelvis 04/19/2020-no significant change in the abnormal soft tissue lesion in the region of the cecal tip involving the ileocecal valve and appendiceal orifice; slight interval progression of numerous hepatic metastases; similar appearance of small lymph nodes in the ileocolic mesentery with adjacent irregular focus of soft tissue also stable Cycle 1 FOLFIRI/Avastin 04/25/2020 Cycle 2 FOLFIRI/Avastin 05/11/2020 Cycle 3 FOLFIRI 05/25/2020 (Avastin held due to proteinuria) Cycle 4 FOLFIRI/Avastin 06/07/2020 Cycle 5 FOLFIRI/Avastin 06/22/2020 CTs 07/05/2020-mild improvement in multifocal liver metastases.  Mass at the base of the cecum is stable.  Persistent thickening of the appendix. Cycle 6 FOLFIRI/Avastin 07/05/2020 Cycle 7 FOLFIRI/Avastin 07/20/2020 Cycle 8 FOLFIRI/Avastin  08/03/2020 Cycle 9 FOLFIRI 09/05/2020, Avastin held secondary to proteinuria CT chest 09/05/2020-negative for  pulmonary embolism, no acute disease, unchanged small pulmonary nodules Cycle 10 FOLFIRI 09/20/2020, Avastin held secondary to proteinuria Cycle 11 FOLFIRI 10/04/2020, Avastin held secondary to proteinuria CTs 10/17/2020-increase in size of liver metastases, stable cecal mass, slight decrease in size of right lower quadrant mesenteric lymph node Cycle 12 FOLFIRI 10/18/2020, Avastin held secondary to proteinuria Cycle 1 FOLFOX/Avastin 11/13/2020 Cycle 2 FOLFOX/Avastin 11/27/2020 Cycle 3 FOLFOX/Avastin 12/12/2020 Cycle 4 FOLFOX/Avastin 12/26/2020 CT 01/04/2021-bulky confluent liver metastases, some are larger and others are stable, increased ascites with suggestion of peritoneal thickening and nodularity, unchanged cecal mass 01/09/2021-every 2-week Avastin continued Cycle 1 Lonsurf 01/15/2021 Therapeutic paracentesis 01/18/2021, 01/24/2021-cytology negative Cycle 2 Lonsurf 02/12/2021 CT abdomen/pelvis 03/02/2021-diffuse hepatic metastases, not significantly changed, large volume ascites, peritoneal thickening and enhancement suspicious for carcinomatosis Cycle 3 Lonsurf 03/12/2021 2) Iron deficiency anemia secondary to #1.  3.  Pain secondary to #1-improved 4.  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  Diarrhea 01/20/2020.  C. difficile negative 7.  COVID-19 infection 08/13/2020, antibody infusion therapy on 08/19/2020 8.  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  Inflamed left labial cyst 09/29/2020-doxycycline 10.  Admission 04/07/2021 with an extensive left lower extremity DVT and bilateral pulmonary emboli-Heparin followed by apixaban    Disposition: Ms. Bangerter has  metastatic colon cancer.  She was recently hospitalized, found to have extensive left lower extremity DVT and bilateral pulmonary emboli.  She is on anticoagulation.  She has been referred to hospice.  She has decided she would like to continue treatment with Lonsurf.  She would still like to meet with hospice tomorrow as scheduled but understands she cannot enroll in hospice if she resumes Lonsurf.  She would like to change CODE STATUS to full code.  We will make that adjustment in the computer.  We are referring her for a therapeutic paracentesis, limit 3 L.  She will begin the next cycle of Lonsurf on 04/23/2021.  We will see her in follow-up around May 29, 2021.  She will contact the office in the interim with any problems.  Patient seen with Dr. Benay Spice.    Ned Card ANP/GNP-BC   04/17/2021  2:24 PM  This was a shared visit with Ned Card.  Ms. Paulus was interviewed and examined.  We discussed treatment options with Ms. Rounds and her husband.  Her performance status has not improved since beginning [place].  She would like to continue Lonsurf in the absence of clear evidence of disease progression.  She has decided against hospice care for now.  We will refer her for home palliative care.  Bevacizumab will remain on hold given the recent DVT/pulmonary embolism.  I was present for greater than 50% of today's visit.  I performed medical decision making.  Julieanne Manson, MD

## 2021-04-18 ENCOUNTER — Other Ambulatory Visit: Payer: 59

## 2021-04-18 ENCOUNTER — Ambulatory Visit (HOSPITAL_COMMUNITY)
Admission: RE | Admit: 2021-04-18 | Discharge: 2021-04-18 | Disposition: A | Discharge status: Home / Self Care | Payer: 59 | Source: Ambulatory Visit | Attending: Nurse Practitioner | Admitting: Nurse Practitioner

## 2021-04-18 DIAGNOSIS — C19 Malignant neoplasm of rectosigmoid junction: Secondary | ICD-10-CM | POA: Insufficient documentation

## 2021-04-18 DIAGNOSIS — R188 Other ascites: Secondary | ICD-10-CM | POA: Diagnosis not present

## 2021-04-18 HISTORY — PX: IR PARACENTESIS: IMG2679

## 2021-04-18 MED ORDER — LIDOCAINE HCL 1 % IJ SOLN
INTRAMUSCULAR | Status: AC
  Filled 2021-04-18: qty 20

## 2021-04-18 MED ORDER — LIDOCAINE HCL 1 % IJ SOLN
INTRAMUSCULAR | Status: DC | PRN
  Administered 2021-04-18: 10 mL

## 2021-04-18 NOTE — Procedures (Signed)
PROCEDURE SUMMARY:  Successful US guided paracentesis from right abdomen.  Yielded 3L of clear yellow fluid.  No immediate complications.  Pt tolerated well.   EBL < 2 mL  Theresa Duty, NP 04/18/2021 3:45 PM  -

## 2021-04-24 ENCOUNTER — Other Ambulatory Visit: Payer: Self-pay | Admitting: Nurse Practitioner

## 2021-04-24 ENCOUNTER — Other Ambulatory Visit (HOSPITAL_COMMUNITY): Payer: Self-pay

## 2021-04-24 ENCOUNTER — Telehealth: Payer: Self-pay

## 2021-04-24 DIAGNOSIS — C19 Malignant neoplasm of rectosigmoid junction: Secondary | ICD-10-CM

## 2021-04-24 MED ORDER — LONSURF 20-8.19 MG PO TABS
35.0000 mg/m2 | ORAL_TABLET | Freq: Two times a day (BID) | ORAL | 0 refills | Status: AC
Start: 2021-04-24 — End: ?

## 2021-04-24 NOTE — Telephone Encounter (Signed)
(  3:26 pm) SW schedule initial palliative care visit with patient. RN/SW visit scheduled for 05/01/21 @ 9:30 am.

## 2021-05-01 ENCOUNTER — Other Ambulatory Visit: Payer: 59 | Admitting: *Deleted

## 2021-05-01 ENCOUNTER — Other Ambulatory Visit: Payer: 59

## 2021-05-01 ENCOUNTER — Other Ambulatory Visit: Payer: Self-pay

## 2021-05-01 ENCOUNTER — Encounter: Payer: Self-pay | Admitting: *Deleted

## 2021-05-01 ENCOUNTER — Encounter: Payer: Self-pay | Admitting: Physician Assistant

## 2021-05-01 ENCOUNTER — Encounter: Payer: Self-pay | Admitting: Oncology

## 2021-05-01 VITALS — BP 93/70 | HR 106 | Temp 97.6°F | Resp 18

## 2021-05-01 DIAGNOSIS — Z515 Encounter for palliative care: Secondary | ICD-10-CM

## 2021-05-01 NOTE — Progress Notes (Signed)
[facility] RN NOTE  PATIENT NAME: Rachel Frank DOB: 10/30/1960 MRN: [id number]  PRIMARY CARE PROVIDER: [name], MD  RESPONSIBLE PARTY: Ann Held (Husband)  Acct ID - Guarantor Home Phone Work Phone Relationship Acct Type  [id number] CATERRA, OSTROFF [phone]  Self P/F     [address], [place], [place] [address]   Covid-19 Pre-screening Negative  PLAN OF CARE and INTERVENTION:  ADVANCE CARE PLANNING/GOALS OF CARE: Goal is for patient to continue treatment for her colorectal cancer. She wants to get stronger and be able to walk. She has a DNR. PATIENT/CAREGIVER EDUCATION: Explained palliative care services, pain management, symptom management, safe mobility/transfers, s/s of infection DISEASE STATUS: Joint visit made with LCSW, M. Lonon. Met with patient, spouse and daughter in the home. Patient has a diagnosis of Stage IV colon cancer with mets to the liver. She was hospitalized from 04/07/21 to 04/12/21 due to a left leg DVT and pulmonary embolism. She also has abdominal ascites and received a palliative paracentesis prior to leaving the hospital. The plan was for her to be discharged home with home hospice. She was evaluated for hospice services, however declined to continue chemotherapy treatment. She is currently on Lonsurf. At the end of this cycle she will have a scan to re-evaluate medication effectiveness. She reports abdominal pain due to fluid accumulation. Family says she used to receive a paracentesis almost weekly. Initially they were removing 5L of fluid but decreased to 3L. Last one was during her hospital stay. She takes Oxycodone for pain, but only at bedtime. She also has neuropathic pain in both feet mainly at the bottom and toes. She is able to tolerate the pain during the day, but she is noticing fluid building up. She does have some shortness of breath with exertion and tires very easily. She has trace edema to both legs and family  keeps them elevated on pillows. Once in a dependent position they start to swell almost instantly. She used to be on Lasix, but this and her other blood pressure medications were discontinued due to hypotension. Her heart rate has been elevated between 105-108. Family continues to monitor this. She is able to stand, pivot and take a few steps using a walker and 1 person assistance. She is mainly transported via wheelchair. She requires assistance with all ADLs but is able to feed herself independently. She was taking showers up until about a month ago and is now given sponge baths. Her appetite is poor. She basically snacks lightly throughout the day. They are sometimes able to get her to drink Premier Protein for additional nutritional supplementation. She has a productive cough with clear phlegm. She became nauseated during visit. She does have Compazine that she has but usually has to be reminded of this. She has been having an increase in hiccups that are bothersome. She is continent of both bowel and bladder for the most part, but does wear Depends just in case.   HISTORY OF PRESENT ILLNESS: This is a 60 yo female with a diagnosis of Stage IV colon cancer with mets to liver. She has a past medical history of essential hypertension, hyperlipidemia and iron-deficiency anemia. Palliative care team was asked to follow patient for additional support, goals of care and complex decision making.  CODE STATUS: DNR ADVANCED DIRECTIVES: Y MOST FORM: no PPS: 40%   PHYSICAL EXAM:   VITALS: Today's Vitals   05/01/21 0951  BP: 93/70  Pulse: (!) 106  Resp: 18  Temp:  97.6 F (36.4 C)  TempSrc: Temporal  SpO2: 100%  PainSc: 4   PainLoc: Abdomen    LUNGS: clear to auscultation  CARDIAC: Cor Tachy EXTREMITIES: Trace edema to bilateral lower extremities SKIN:  Exposed skin is dry and intact   NEURO:  Alert and oriented x 3, forgetful, increased generalized weakness, minimally ambulatory w/walker and 1  person assistance.   (Duration of visit and documentation 75 minutes)   Daryl Eastern, RN BSN

## 2021-05-01 NOTE — Progress Notes (Signed)
COMMUNITY PALLIATIVE CARE SW NOTE  PATIENT NAME: Rachel Frank DOB: [birth year] MRN: [id number]  PRIMARY CARE PROVIDER: [name], MD  RESPONSIBLE PARTY:  Acct ID - Guarantor Home Phone Work Phone Relationship Acct Type  [id number] Rachel Frank [phone]  Self P/F     Rachel Frank [id number]     PLAN OF CARE and INTERVENTIONS:             GOALS OF CARE/ ADVANCE CARE PLANNING:  Goal is for patient to remain at home and gain her strength. Patient is a DNR. SOCIAL/EMOTIONAL/SPIRITUAL ASSESSMENT/ INTERVENTIONS:  SW and RN-Rachel Frank completed an initial palliative care visit with patient at her home. She was present with her husband-Rachel Frank and daughter-Rachel Frank, who arrived later. SW observed patient sitting on her couch with her feet elevated. She was alert and oriented x3, however she appeared to be forgetful. Her husband assisted her with recalling information or history when needed. The team provided education regarding the palliative care services to patient and her husband who provided verbal consent to services. Patient and family provided a status update. Patient is diagnosed with stage IV colon cancer with metastasizes to her liver. Patient is currently receiving chemotherapy. She is having tightness in her abdomen area due to fluid retention. She is also keeps her legs elevated to reduce fluid retention. Patient report that her breathing is fine until she starts to have fluid retention in her abdomen. Patient needs assistance with ADL's. Patent is only taking a few steps to to transfer and pivot. She is only taking sponge baths. She is continent but wears adult briefs. Her appetite is poor, but her family encourages her to eat softer foods and have added a protein shake to her diet. Patient is not sleeping well at night. Patient has a persistent cough that produces clear flym. She is experiencing neuropathy, particularly in her feet. She stated that she does  experience pain and sensitivity to the bottom of her feet. Patient's husband serve as her PCG along with her two daughters. Patient is scheduled to see Dr. Benay Frank on 05/21/21 and she was encouraged to discuss her experience with neuropathy with him as well as her desire for physical therapy. SOCIAL HISTORY: Patient was born in [place], [place], but was raised in [place], [place]. She graduated high school. She is has been married to her husband for 36 years and they have two daughters. Patient retired from [employer] after 30 years. Patient is [employer] by faith and she has DNR on file.  PATIENT/CAREGIVER EDUCATION/ COPING:  Patient appears to be coping well. She has great family support. PERSONAL EMERGENCY PLAN: 911 can be activated for emergencies. COMMUNITY RESOURCES COORDINATION/ HEALTH CARE NAVIGATION:  SW provided additional resources for in-home care to assist patient with personal care needs. FINANCIAL/LEGAL CONCERNS/INTERVENTIONS:  None.     SOCIAL HX:  Social History   Tobacco Use   Smoking status: Never   Smokeless tobacco: Never  Substance Use Topics   Alcohol use: No    Alcohol/week: 0.0 standard drinks    CODE STATUS: DNR ADVANCED DIRECTIVES: No MOST FORM COMPLETE:  No HOSPICE EDUCATION PROVIDED: No  PPS: Patient is alert and oriented x3. She is dependent for personal care needs.   Duration of visit and documentation 60 minutes  Katheren Puller, LCSW

## 2021-05-01 NOTE — Progress Notes (Signed)
Faxed recent office note/labs to Dr. Lenise Arena at [facility]. Husband called to request referral to Dr. Morton Stall.

## 2021-05-07 ENCOUNTER — Inpatient Hospital Stay: Payer: 59

## 2021-05-07 ENCOUNTER — Inpatient Hospital Stay: Payer: 59 | Admitting: Oncology

## 2021-05-15 DEATH — deceased

## 2021-05-18 ENCOUNTER — Telehealth: Payer: Self-pay

## 2021-05-18 NOTE — Telephone Encounter (Signed)
Done

## 2021-05-18 NOTE — Telephone Encounter (Signed)
[facility] called requesting death certificate  signed by PCP asap because patient is pending cremation.

## 2022-06-11 IMAGING — US IR PARACENTESIS
1 series · 2 of 2 positions shown · non-contrast
Comparison: none

INDICATION: Patient with history of colon cancer. Found to have ascites. Patient
presents for therapeutic and diagnostic paracentesis

[Series 1: ir (id) (id)/(id)/(id) ir · 2 of 2 slices shown]
[im 1/2]
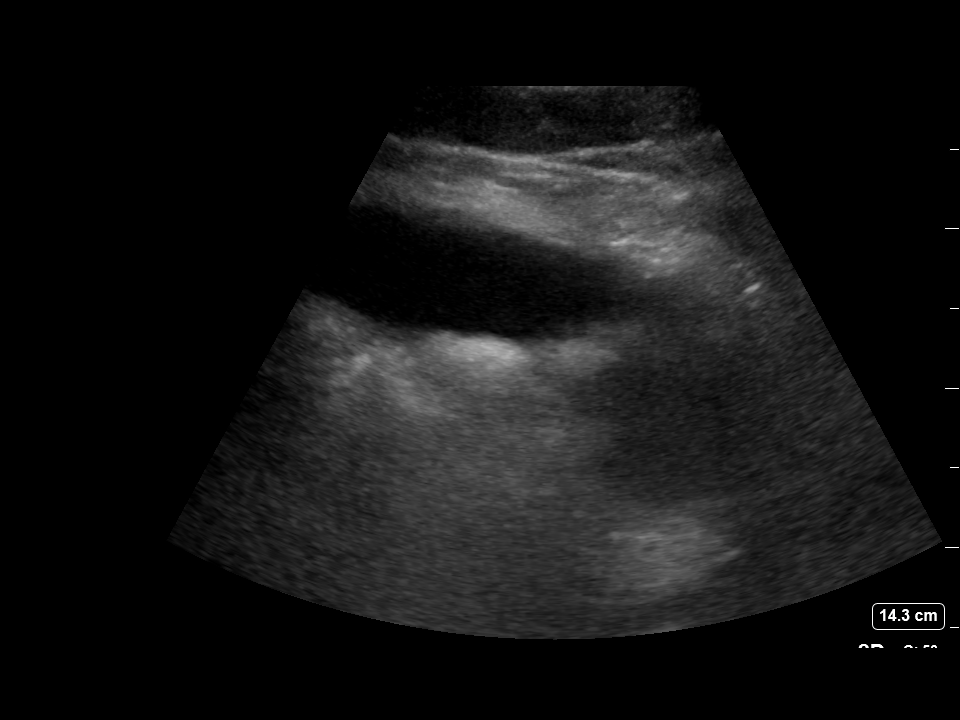
[im 2/2]
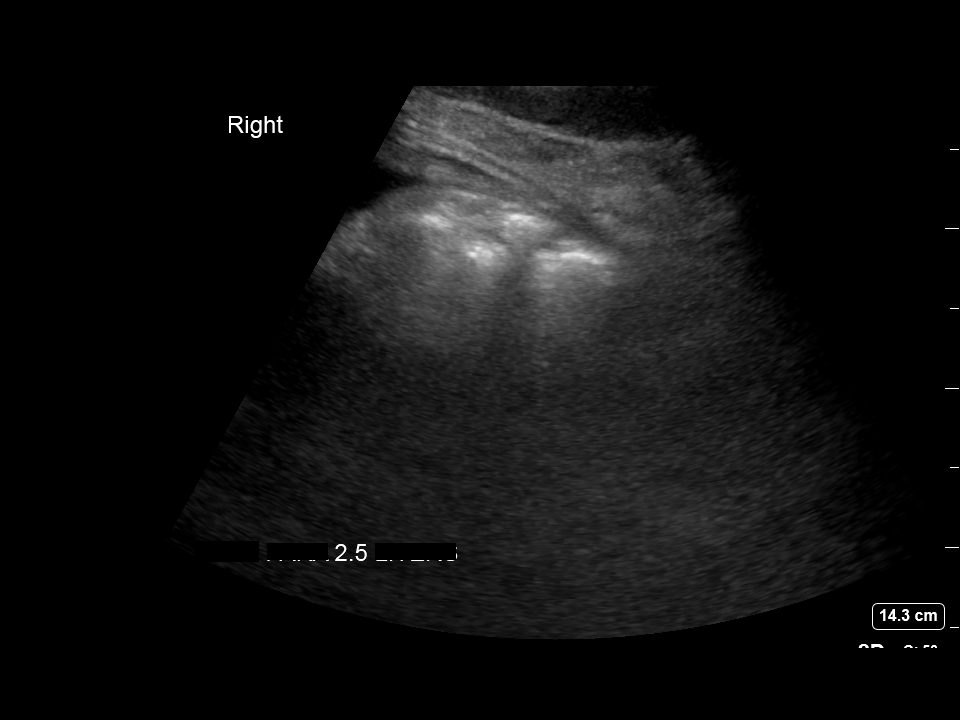

[2 of 2 positions shown; findings below may reference images not displayed]

EXAM:
ULTRASOUND GUIDED THERAPEUTIC AND DIAGNOSTIC PARACENTESIS

MEDICATIONS:
Lidocaine 1% 10 mL

COMPLICATIONS:
None immediate.

PROCEDURE:
Informed written consent was obtained from the patient after a
discussion of the risks, benefits and alternatives to treatment. A
timeout was performed prior to the initiation of the procedure.

Initial ultrasound scanning demonstrates a small amount of ascites
within the right lower abdominal quadrant. The right lower abdomen
was prepped and draped in the usual sterile fashion. 1% lidocaine
was used for local anesthesia.

Following this, a 19 gauge, 7-cm, Yueh catheter was introduced. An
ultrasound image was saved for documentation purposes. The
paracentesis was performed. The catheter was removed and a dressing
was applied. The patient tolerated the procedure well without
immediate post procedural complication.
Patient received post-procedure intravenous albumin; see nursing
notes for details.
FINDINGS: A total of approximately 2.4 L of straw-colored fluid was removed.
Samples were sent to the laboratory as requested by the clinical
team.
IMPRESSION: Successful ultrasound-guided therapeutic and diagnostic paracentesis
yielding 2.4 liters of peritoneal fluid.

Read by: Oo Eberly, NP
